# Patient Record
Sex: Female | Born: 1940
Health system: Southern US, Community
[De-identification: ages and names within clinical notes are randomized; demographics above are authoritative.]

## PROBLEM LIST (undated history)

## (undated) DIAGNOSIS — T7840XA Allergy, unspecified, initial encounter: Secondary | ICD-10-CM

## (undated) DIAGNOSIS — E119 Type 2 diabetes mellitus without complications: Secondary | ICD-10-CM

## (undated) HISTORY — PX: TUBAL LIGATION: SHX77

## (undated) HISTORY — PX: EYE SURGERY: SHX253

## (undated) HISTORY — PX: DG THUMB RIGHT HAND (ARMC HX): HXRAD1825

## (undated) HISTORY — DX: Type 2 diabetes mellitus without complications: E11.9

## (undated) HISTORY — DX: Allergy, unspecified, initial encounter: T78.40XA

---

## 2000-04-19 ENCOUNTER — Encounter: Payer: Self-pay | Admitting: Internal Medicine

## 2000-04-19 ENCOUNTER — Ambulatory Visit (HOSPITAL_COMMUNITY): Admission: RE | Admit: 2000-04-19 | Discharge: 2000-04-19 | Payer: Self-pay | Admitting: Internal Medicine

## 2001-11-06 ENCOUNTER — Other Ambulatory Visit: Admission: RE | Admit: 2001-11-06 | Discharge: 2001-11-06 | Payer: Self-pay | Admitting: Internal Medicine

## 2001-12-09 ENCOUNTER — Ambulatory Visit (HOSPITAL_COMMUNITY): Admission: RE | Admit: 2001-12-09 | Discharge: 2001-12-09 | Payer: Self-pay | Admitting: Internal Medicine

## 2001-12-09 ENCOUNTER — Encounter: Payer: Self-pay | Admitting: Internal Medicine

## 2003-03-15 ENCOUNTER — Encounter: Payer: Self-pay | Admitting: Internal Medicine

## 2003-03-15 ENCOUNTER — Ambulatory Visit (HOSPITAL_COMMUNITY): Admission: RE | Admit: 2003-03-15 | Discharge: 2003-03-15 | Payer: Self-pay | Admitting: Internal Medicine

## 2009-05-10 ENCOUNTER — Encounter: Admission: RE | Admit: 2009-05-10 | Discharge: 2009-05-10 | Payer: Self-pay | Admitting: Internal Medicine

## 2009-09-14 LAB — HM COLONOSCOPY

## 2010-11-20 ENCOUNTER — Encounter: Payer: Self-pay | Admitting: Internal Medicine

## 2010-11-30 ENCOUNTER — Other Ambulatory Visit (HOSPITAL_COMMUNITY): Payer: Self-pay | Admitting: Internal Medicine

## 2010-11-30 DIAGNOSIS — Z1231 Encounter for screening mammogram for malignant neoplasm of breast: Secondary | ICD-10-CM

## 2010-11-30 DIAGNOSIS — Z1239 Encounter for other screening for malignant neoplasm of breast: Secondary | ICD-10-CM

## 2010-12-11 ENCOUNTER — Ambulatory Visit (HOSPITAL_COMMUNITY)
Admission: RE | Admit: 2010-12-11 | Discharge: 2010-12-11 | Disposition: A | Discharge status: Home / Self Care | Payer: Medicare FFS | Source: Ambulatory Visit | Attending: Internal Medicine | Admitting: Internal Medicine

## 2010-12-11 DIAGNOSIS — Z1231 Encounter for screening mammogram for malignant neoplasm of breast: Secondary | ICD-10-CM | POA: Insufficient documentation

## 2015-12-28 ENCOUNTER — Telehealth (INDEPENDENT_AMBULATORY_CARE_PROVIDER_SITE_OTHER): Payer: Self-pay | Admitting: Internal Medicine

## 2017-12-25 ENCOUNTER — Ambulatory Visit: Payer: Medicare FFS | Admitting: Family Medicine

## 2018-01-13 ENCOUNTER — Ambulatory Visit (INDEPENDENT_AMBULATORY_CARE_PROVIDER_SITE_OTHER): Payer: Medicare HMO | Admitting: Family Medicine

## 2018-01-13 ENCOUNTER — Encounter: Payer: Self-pay | Admitting: Family Medicine

## 2018-01-13 ENCOUNTER — Other Ambulatory Visit: Payer: Self-pay

## 2018-01-13 VITALS — BP 148/70 | HR 74 | Temp 98.3°F | Ht 61.0 in | Wt 131.0 lb

## 2018-01-13 DIAGNOSIS — H539 Unspecified visual disturbance: Secondary | ICD-10-CM | POA: Insufficient documentation

## 2018-01-13 DIAGNOSIS — Z23 Encounter for immunization: Secondary | ICD-10-CM | POA: Insufficient documentation

## 2018-01-13 DIAGNOSIS — E2839 Other primary ovarian failure: Secondary | ICD-10-CM

## 2018-01-13 DIAGNOSIS — Z0000 Encounter for general adult medical examination without abnormal findings: Secondary | ICD-10-CM | POA: Insufficient documentation

## 2018-01-13 DIAGNOSIS — Z794 Long term (current) use of insulin: Secondary | ICD-10-CM

## 2018-01-13 DIAGNOSIS — E119 Type 2 diabetes mellitus without complications: Secondary | ICD-10-CM | POA: Insufficient documentation

## 2018-01-13 DIAGNOSIS — E1100 Type 2 diabetes mellitus with hyperosmolarity without nonketotic hyperglycemic-hyperosmolar coma (NKHHC): Secondary | ICD-10-CM | POA: Diagnosis not present

## 2018-01-13 DIAGNOSIS — E1165 Type 2 diabetes mellitus with hyperglycemia: Secondary | ICD-10-CM | POA: Diagnosis not present

## 2018-01-13 DIAGNOSIS — E1122 Type 2 diabetes mellitus with diabetic chronic kidney disease: Secondary | ICD-10-CM | POA: Insufficient documentation

## 2018-01-13 HISTORY — DX: Other primary ovarian failure: E28.39

## 2018-01-13 HISTORY — DX: Encounter for general adult medical examination without abnormal findings: Z00.00

## 2018-01-13 HISTORY — DX: Encounter for immunization: Z23

## 2018-01-13 HISTORY — DX: Unspecified visual disturbance: H53.9

## 2018-01-13 LAB — POCT GLYCOSYLATED HEMOGLOBIN (HGB A1C): Hemoglobin A1C: 13.1

## 2018-01-13 NOTE — Progress Notes (Signed)
Subjective: Chief Complaint  Patient presents with  . New Patient (Initial Visit)    HPI: Sara [name] is a 77 y.o. presenting to clinic today to discuss the following:  [name] presents today as a new patient for referral to an opthamologist. She has a past medical history of seasonal allergies. She denies any significant PMH such as HTN, HLD, DM, heart disease, COPD, or asthma.  She states she noticed about one month ago she began having blurry vision in the right center of her visual field of her right eye. She describes the blurry vision as "like looking through a film" or if she had "a film on my eye". She denies any noticeable vision changes and has noticed any difference in reading. She has never had any eye pain, no drainage, and has no other associated symptoms. It is only in her right eye, left eye has normal vision with no changes per patient. She denies blurry vision per se but difficult to illicit what she means by "looking through a film".  She denies headache, syncope, near syncope, chest pain, SOB, cough, congestion, rhinorrhea, abdominal pain, nausea, vomiting, or diarrhea.   She does endorse having some constipation.  Health Maintenance: Given referral for Dexa scan, received PNA vaccine today. Nothing else due at this time.    ROS noted in HPI.   Past Medical, Surgical, Social, and Family History Reviewed & Updated per EMR.   Pertinent Historical Findings include:   Social History   Tobacco Use  Smoking Status Never Smoker  Smokeless Tobacco Never Used    Objective: BP (!) 148/70   Pulse 74   Temp 98.3 F (36.8 C) (Oral)   Ht 5\' 1"  (1.549 m)   Wt 131 lb (59.4 kg)   SpO2 99%   BMI 24.75 kg/m  Vitals and nursing notes reviewed  Physical Exam  Constitutional: She is oriented to person, place, and time and well-developed, well-nourished, and in no distress. No distress.  HENT:  Head: Normocephalic and atraumatic.  Nose: Nose  normal.  Mouth/Throat: Oropharynx is clear and moist. No oropharyngeal exudate.  Eyes: Pupils are equal, round, and reactive to light. Conjunctivae and EOM are normal. Right eye exhibits no discharge. Left eye exhibits no discharge. No scleral icterus.  Visual field is normal. No nystagmus present.   Neck: Neck supple. No tracheal deviation present. No thyromegaly present.  Cardiovascular: Normal rate, regular rhythm, normal heart sounds and intact distal pulses.  No murmur heard. Pulmonary/Chest: Effort normal and breath sounds normal. No respiratory distress. She has no wheezes. She has no rales.  Abdominal: Soft. Bowel sounds are normal. She exhibits no distension. There is no tenderness. There is no rebound.  Musculoskeletal: Normal range of motion. She exhibits no edema, tenderness or deformity.  Lymphadenopathy:    She has no cervical adenopathy.  Neurological: She is alert and oriented to person, place, and time.  Skin: Skin is warm and dry. No rash noted. No erythema.  Psychiatric: Mood and affect normal.   Assessment/Plan:  Estrogen deficiency Dexa scan referral made for patient.  Need for prophylactic vaccination against Streptococcus pneumoniae (pneumococcus) Recommended for patient. Given today after visit by nurse.  Vision changes Most likely, her vision changes are due to developing cataract. She does not appear to have a cataract on physical exam as her lens does not appear grossly cloudy. Differential includes diabetic retinopathy that cannot be excluded. No floaters endorsed making it less likely. Also, she did  not endorse necessarily blurred vision making diabetic retinopathy less likely but needs to be ruled out.  I have referred her to opthalmology.  Type 2 diabetes mellitus with hyperosmolarity without coma, without long-term current use of insulin ([place]) Patient presented with no history, symptoms, or complaints of DM.   HgbA1c was 13.1 with POCT. I have contacted  patient by phone to return to the office to start DM education, lifestyle modification, and will most likely start her on insulin and Metformin.  Routine general medical examination at a health care facility She has had no blood work in 10 years. Due to age and over 10 years since having been screened I am getting today: HgbA1c CBC CMP Lipid Panel    PATIENT EDUCATION PROVIDED: See AVS    Diagnosis and plan along with any newly prescribed medication(s) were discussed in detail with this patient today. The patient verbalized understanding and agreed with the plan. Patient advised if symptoms worsen return to clinic or ER.   Health Maintainance:   Orders Placed This Encounter  Procedures  . DG BONE DENSITY (DXA)    INS-[facility]MA[facility] MCR PF 9-10 YRS AGO UNKNOWN LOCATION/WT 132 LBS/NO CALC SUPP/NO NEEDS/BC PT COSIGN REQ    Order Specific Question:   Reason for Exam (SYMPTOM  OR DIAGNOSIS REQUIRED)    Answer:   ESTROGEN DEFICIENCY    Order Specific Question:   Preferred imaging location?    Answer:   [facility]  . Pneumococcal conjugate vaccine 13-valent IM  . CMP and Liver  . CBC  . Lipid Panel  . Ambulatory referral to Ophthalmology    Referral Priority:   Routine    Referral Type:   Consultation    Referral Reason:   Specialty Services Required    Requested Specialty:   Ophthalmology    Number of Visits Requested:   1  . HgB A1c    No orders of the defined types were placed in this encounter.    [name], DO 01/18/2018, 3:53 PM PGY-1, [place]

## 2018-01-13 NOTE — Patient Instructions (Signed)
It was great to meet you today! Thank you for letting me participate in your care!  Today, we discussed your vision changes in your right eye. It is most likely a cataract. They are not life threatening but will continue to get worse and cause more "cloudy vision" as they progress. I am referring you to opthalmology for possible surgery.  Otherwise, you are very healthy and I am pleased you are staying active and exercising. I will call you with your lab results.  Be well, [name], DO PGY-1, [facility]

## 2018-01-14 LAB — CBC
Hematocrit: 45.5 % (ref 34.0–46.6)
Hemoglobin: 14.4 g/dL (ref 11.1–15.9)
MCH: 26.6 pg (ref 26.6–33.0)
MCHC: 31.6 g/dL (ref 31.5–35.7)
MCV: 84 fL (ref 79–97)
PLATELETS: 243 10*3/uL (ref 150–379)
RBC: 5.41 x10E6/uL — ABNORMAL HIGH (ref 3.77–5.28)
RDW: 14.6 % (ref 12.3–15.4)
WBC: 6.2 10*3/uL (ref 3.4–10.8)

## 2018-01-14 LAB — CMP AND LIVER
ALT: 13 IU/L (ref 0–32)
AST: 16 IU/L (ref 0–40)
Albumin: 4.2 g/dL (ref 3.5–4.8)
Alkaline Phosphatase: 98 IU/L (ref 39–117)
BUN: 16 mg/dL (ref 8–27)
Bilirubin Total: 0.5 mg/dL (ref 0.0–1.2)
Bilirubin, Direct: 0.13 mg/dL (ref 0.00–0.40)
CO2: 23 mmol/L (ref 20–29)
Calcium: 10.1 mg/dL (ref 8.7–10.3)
Chloride: 100 mmol/L (ref 96–106)
Creatinine, Ser: 0.69 mg/dL (ref 0.57–1.00)
GFR calc Af Amer: 98 mL/min/{1.73_m2} (ref >59)
GFR calc non Af Amer: 85 mL/min/{1.73_m2} (ref >59)
Glucose: 257 mg/dL — ABNORMAL HIGH (ref 65–99)
Potassium: 4.7 mmol/L (ref 3.5–5.2)
Sodium: 139 mmol/L (ref 134–144)
Total Protein: 7.2 g/dL (ref 6.0–8.5)

## 2018-01-14 LAB — LIPID PANEL
CHOLESTEROL TOTAL: 191 mg/dL (ref 100–199)
Chol/HDL Ratio: 3 ratio (ref 0.0–4.4)
HDL: 64 mg/dL (ref >39)
LDL CALC: 109 mg/dL — AB (ref 0–99)
Triglycerides: 92 mg/dL (ref 0–149)
VLDL Cholesterol Cal: 18 mg/dL (ref 5–40)

## 2018-01-15 ENCOUNTER — Other Ambulatory Visit: Payer: Self-pay | Admitting: Family Medicine

## 2018-01-15 DIAGNOSIS — Z1231 Encounter for screening mammogram for malignant neoplasm of breast: Secondary | ICD-10-CM

## 2018-01-16 ENCOUNTER — Telehealth: Payer: Self-pay | Admitting: Family Medicine

## 2018-01-16 NOTE — Telephone Encounter (Signed)
Called patient and left VM informing her of abnormal glucose findings. Informed patient to please call the office and return to the clinic Friday preferably but no later than Tuesday.  Will follow up with clinic tomorrow to ensure she calls to be seen.

## 2018-01-17 NOTE — Telephone Encounter (Signed)
Patient has an appointment set up for 02/13/2018. Should we call to see if she can come in sooner?Sara [name], [place]

## 2018-01-18 ENCOUNTER — Encounter: Payer: Self-pay | Admitting: Family Medicine

## 2018-01-18 NOTE — Assessment & Plan Note (Signed)
Recommended for patient. Given today after visit by nurse.

## 2018-01-18 NOTE — Telephone Encounter (Signed)
Yes please!  Let's get her to come in on Monday or Tuesday preferably. If she can't come in on those days just get her the first available appointment that will work for her.

## 2018-01-18 NOTE — Assessment & Plan Note (Signed)
Dexa scan referral made for patient.

## 2018-01-18 NOTE — Assessment & Plan Note (Addendum)
Most likely, her vision changes are due to developing cataract. She does not appear to have a cataract on physical exam as her lens does not appear grossly cloudy. Differential includes diabetic retinopathy that cannot be excluded. No floaters endorsed making it less likely. Also, she did not endorse necessarily blurred vision making diabetic retinopathy less likely but needs to be ruled out.  I have referred her to opthalmology.

## 2018-01-18 NOTE — Assessment & Plan Note (Signed)
Patient presented with no history, symptoms, or complaints of DM.   HgbA1c was 13.1 with POCT. I have contacted patient by phone to return to the office to start DM education, lifestyle modification, and will most likely start her on insulin and Metformin.

## 2018-01-18 NOTE — Assessment & Plan Note (Signed)
She has had no blood work in 10 years. Due to age and over 10 years since having been screened I am getting today: HgbA1c CBC CMP Lipid Panel

## 2018-01-21 NOTE — Telephone Encounter (Signed)
I have been trying to get in touch with patient to see if she could come in sooner to discuss her lab results per Dr. Garlan Fillers, but, there is no voicemail an no answer or the phone is picked up but no one says anything. I will leave her current appointment as is.Ozella Almond, CMA

## 2018-01-27 DIAGNOSIS — H25041 Posterior subcapsular polar age-related cataract, right eye: Secondary | ICD-10-CM | POA: Diagnosis not present

## 2018-01-27 DIAGNOSIS — H2513 Age-related nuclear cataract, bilateral: Secondary | ICD-10-CM | POA: Diagnosis not present

## 2018-01-27 DIAGNOSIS — H524 Presbyopia: Secondary | ICD-10-CM | POA: Diagnosis not present

## 2018-02-10 ENCOUNTER — Ambulatory Visit
Admission: RE | Admit: 2018-02-10 | Discharge: 2018-02-10 | Disposition: A | Discharge status: Home / Self Care | Payer: Medicare HMO | Source: Ambulatory Visit | Attending: Family Medicine | Admitting: Family Medicine

## 2018-02-10 ENCOUNTER — Ambulatory Visit
Admission: RE | Admit: 2018-02-10 | Discharge: 2018-02-10 | Disposition: A | Discharge status: Home / Self Care | Payer: Medicare HMO | Source: Ambulatory Visit | Attending: *Deleted | Admitting: *Deleted

## 2018-02-10 DIAGNOSIS — Z1231 Encounter for screening mammogram for malignant neoplasm of breast: Secondary | ICD-10-CM | POA: Diagnosis not present

## 2018-02-10 DIAGNOSIS — Z780 Asymptomatic menopausal state: Secondary | ICD-10-CM | POA: Diagnosis not present

## 2018-02-10 DIAGNOSIS — M8588 Other specified disorders of bone density and structure, other site: Secondary | ICD-10-CM | POA: Diagnosis not present

## 2018-02-13 ENCOUNTER — Ambulatory Visit: Payer: Medicare HMO | Admitting: Family Medicine

## 2018-02-18 ENCOUNTER — Telehealth: Payer: Self-pay | Admitting: Family Medicine

## 2018-02-18 DIAGNOSIS — Z794 Long term (current) use of insulin: Principal | ICD-10-CM

## 2018-02-18 DIAGNOSIS — E119 Type 2 diabetes mellitus without complications: Secondary | ICD-10-CM

## 2018-02-18 DIAGNOSIS — I10 Essential (primary) hypertension: Secondary | ICD-10-CM | POA: Diagnosis not present

## 2018-02-18 NOTE — Telephone Encounter (Signed)
Pt went to urgent care today.  Her BP readings 176/96.  Pulse was 92.  Pt is wondering if Dr Garlan Fillers needs to see her or could he just sent BP medicine to the drugstore?  [facility]  [place]. Please advise

## 2018-02-18 NOTE — Telephone Encounter (Signed)
Message left on clinic nurse voice mail - daughter calling due to patient has been having dizziness.  Went to urgent care and BP was 170/96 and this was high for her mother.  Patient called here and was given appt for 02/24/18.  Daughter feels this is too long and would like patient seen sooner or some advice.  Returned call to patient's daughter and left message to return call to our office.   Burna Forts, BSN, RN-BC

## 2018-02-19 NOTE — Telephone Encounter (Signed)
Called pt and got her something sooner. Deseree Kennon Holter, CMA

## 2018-02-21 ENCOUNTER — Other Ambulatory Visit: Payer: Self-pay

## 2018-02-21 ENCOUNTER — Encounter: Payer: Self-pay | Admitting: Internal Medicine

## 2018-02-21 ENCOUNTER — Ambulatory Visit (INDEPENDENT_AMBULATORY_CARE_PROVIDER_SITE_OTHER): Payer: Medicare HMO | Admitting: Internal Medicine

## 2018-02-21 VITALS — BP 138/90 | HR 93 | Temp 98.8°F | Ht 61.0 in | Wt 130.8 lb

## 2018-02-21 DIAGNOSIS — R42 Dizziness and giddiness: Secondary | ICD-10-CM

## 2018-02-21 DIAGNOSIS — I10 Essential (primary) hypertension: Secondary | ICD-10-CM | POA: Diagnosis not present

## 2018-02-21 DIAGNOSIS — E1159 Type 2 diabetes mellitus with other circulatory complications: Secondary | ICD-10-CM | POA: Insufficient documentation

## 2018-02-21 DIAGNOSIS — E1100 Type 2 diabetes mellitus with hyperosmolarity without nonketotic hyperglycemic-hyperosmolar coma (NKHHC): Secondary | ICD-10-CM | POA: Diagnosis not present

## 2018-02-21 HISTORY — DX: Dizziness and giddiness: R42

## 2018-02-21 MED ORDER — METFORMIN HCL 500 MG PO TABS: 500.0000 mg | ORAL_TABLET | Freq: Two times a day (BID) | ORAL | 3 refills | Status: DC

## 2018-02-21 MED ORDER — AMLODIPINE BESYLATE 5 MG PO TABS
5.0000 mg | ORAL_TABLET | Freq: Every day | ORAL | 3 refills | Status: DC
Start: 2018-02-21 — End: 2018-04-22

## 2018-02-21 NOTE — Assessment & Plan Note (Signed)
A1C 13  -Discussed nutrition changes with patient-specifically stopping Dr. Samson Frederic -Will start patient on metformin 500 mg once daily increase the dose to twice daily over the next 2 weeks -Patient to follow-up in 2 weeks

## 2018-02-21 NOTE — Patient Instructions (Addendum)
I am going to start you on metformin - for first  week take 1 pill in the morning, for the next week take 1 pill in the monring and 1 pill at night. Please follow up in two weeks with your PCP. I am also going to place you an small dose of blood pressure medication. For the dizzines hydration is key and stopping  Dr. Malachi Bonds.

## 2018-02-21 NOTE — Progress Notes (Addendum)
   [facility] Kerrin Mo, MD Phone: [phone]  Reason For Visit: SDA for Dizziness   # Dizziness  Patient presents with dizziness, she was dizzy on Monday.  Patient went from sitting to standing and had dizziness. Patient had an elevated blood pressure at that time of 170/80. Patient felt a little dizzy again today. No  Chest pain, no SOB, no swelling legs. Denies any vertigo. Denies palpations. Patient has been had increased frequency of urination and increase polydipsia.  He was recently diagnosed with uncontrolled diabetes.  She has not followed up with her PCP yet.  She had an A1c of 13 at her last visit.  She has been drinking lots of Dr. Samson Frederic.   # HTN  Blood pressures have been elevated.  Reports good compliance, took meds today. Tolerating well, w/o complaints. Denies CP, dyspnea, HA, edema,  # Diabetes  -Elevated A1c of 13 at last appointment.  Patient has not followed up with PCP yet.  Past Medical History Reviewed problem list.  Medications- reviewed and updated No additions to family history  Objective: BP 138/90   Pulse 93   Temp 98.8 F (37.1 C) (Oral)   Ht '5\' 1"'$  (1.549 m)   Wt 130 lb 12.8 oz (59.3 kg)   SpO2 98%   BMI 24.71 kg/m  Gen: NAD, alert, cooperative with exam Cardio: regular rate and rhythm, S1S2 heard, no murmurs appreciated Pulm: clear to auscultation bilaterally, no wheezes, rhonchi or rales Extremities: warm, well perfused, No edema, cyanosis or clubbing;  Neurologic exam : Cn 2-7 intact Strength equal & normal in upper & lower extremities Able to walk on heels and toes.   Balance normal, finger to nose     Assessment/Plan: See problem based a/p  Type 2 diabetes mellitus with hyperosmolarity without coma, without long-term current use of insulin ([facility]) A1C 13  -Discussed nutrition changes with patient-specifically stopping Dr. Samson Frederic -Will start patient on metformin 500 mg once daily increase the dose to  twice daily over the next 2 weeks -Patient to follow-up in 2 weeks  Dizziness Dizziness consistent with orthostatic hypotension, not noted today on blood pressures.  However high suspicion for this given patient with significant polyuria and polydipsia.  Likely dehydrated from her highly uncontrolled type 2 diabetes.  Normal neurologic exam, no cardiac sounding symptoms.  -See diabetes for plan management -Continued aggressive hydration -Obtain be met  HTN (hypertension) Notable for elevated blood pressures at home though blood pressure pretty well controlled here Will start a low-dose Norvasc 5 mg Follow-up with PCP in 2 weeks

## 2018-02-21 NOTE — Assessment & Plan Note (Addendum)
Dizziness consistent with orthostatic hypotension, not noted today on blood pressures.  However high suspicion for this given patient with significant polyuria and polydipsia.  Likely dehydrated from her highly uncontrolled type 2 diabetes.  Normal neurologic exam, no cardiac sounding symptoms.  -See diabetes for plan management -Continued aggressive hydration -Obtain be met

## 2018-02-21 NOTE — Assessment & Plan Note (Signed)
Notable for elevated blood pressures at home though blood pressure pretty well controlled here Will start a low-dose Norvasc 5 mg Follow-up with PCP in 2 weeks

## 2018-02-22 LAB — BASIC METABOLIC PANEL
BUN/Creatinine Ratio: 18 (ref 12–28)
BUN: 15 mg/dL (ref 8–27)
CALCIUM: 10.5 mg/dL — AB (ref 8.7–10.3)
CO2: 25 mmol/L (ref 20–29)
Chloride: 94 mmol/L — ABNORMAL LOW (ref 96–106)
Creatinine, Ser: 0.82 mg/dL (ref 0.57–1.00)
GFR, EST AFRICAN AMERICAN: 80 mL/min/{1.73_m2} (ref >59)
GFR, EST NON AFRICAN AMERICAN: 70 mL/min/{1.73_m2} (ref >59)
Glucose: 307 mg/dL — ABNORMAL HIGH (ref 65–99)
POTASSIUM: 4.8 mmol/L (ref 3.5–5.2)
Sodium: 133 mmol/L — ABNORMAL LOW (ref 134–144)

## 2018-02-24 ENCOUNTER — Ambulatory Visit: Payer: Medicare HMO | Admitting: Internal Medicine

## 2018-02-26 DIAGNOSIS — H25041 Posterior subcapsular polar age-related cataract, right eye: Secondary | ICD-10-CM | POA: Diagnosis not present

## 2018-02-26 DIAGNOSIS — H2511 Age-related nuclear cataract, right eye: Secondary | ICD-10-CM | POA: Diagnosis not present

## 2018-02-26 DIAGNOSIS — H25811 Combined forms of age-related cataract, right eye: Secondary | ICD-10-CM | POA: Diagnosis not present

## 2018-02-26 DIAGNOSIS — H25011 Cortical age-related cataract, right eye: Secondary | ICD-10-CM | POA: Diagnosis not present

## 2018-02-27 ENCOUNTER — Encounter: Payer: Self-pay | Admitting: Family Medicine

## 2018-02-27 NOTE — Progress Notes (Signed)
Sending results letter to patient.

## 2018-03-03 ENCOUNTER — Encounter: Payer: Self-pay | Admitting: Internal Medicine

## 2018-03-07 ENCOUNTER — Encounter: Payer: Self-pay | Admitting: Family Medicine

## 2018-03-07 ENCOUNTER — Other Ambulatory Visit: Payer: Self-pay

## 2018-03-07 ENCOUNTER — Ambulatory Visit (INDEPENDENT_AMBULATORY_CARE_PROVIDER_SITE_OTHER): Payer: Medicare HMO | Admitting: Family Medicine

## 2018-03-07 VITALS — BP 140/76 | HR 100 | Temp 98.5°F | Ht 61.0 in | Wt 130.2 lb

## 2018-03-07 DIAGNOSIS — E1100 Type 2 diabetes mellitus with hyperosmolarity without nonketotic hyperglycemic-hyperosmolar coma (NKHHC): Secondary | ICD-10-CM | POA: Diagnosis not present

## 2018-03-07 LAB — POCT UA - MICROALBUMIN
Creatinine, POC: 200 mg/dL
Microalbumin Ur, POC: 150 mg/L

## 2018-03-07 LAB — GLUCOSE, POCT (MANUAL RESULT ENTRY): POC Glucose: 214 mg/dl — AB (ref 70–99)

## 2018-03-07 LAB — POCT GLYCOSYLATED HEMOGLOBIN (HGB A1C): Hemoglobin A1C: 12.6

## 2018-03-07 MED ORDER — INSULIN GLARGINE 100 UNIT/ML SOLOSTAR PEN: 10.0000 [IU] | PEN_INJECTOR | Freq: Every day | SUBCUTANEOUS | 0 refills | Status: DC

## 2018-03-07 MED ORDER — ONETOUCH VERIO W/DEVICE KIT: 1.0000 | PACK | Freq: Once | 0 refills | Status: AC

## 2018-03-07 MED ORDER — ONETOUCH DELICA LANCING DEV MISC: 1.0000 | Freq: Once | 0 refills | Status: AC

## 2018-03-07 MED ORDER — GLUCOSE BLOOD VI STRP: ORAL_STRIP | 12 refills | Status: DC

## 2018-03-07 MED ORDER — ACCU-CHEK SOFTCLIX LANCETS MISC: 12 refills | Status: DC

## 2018-03-07 MED ORDER — ONETOUCH LANCETS MISC: 1.0000 | Freq: Every day | 99 refills | Status: DC

## 2018-03-07 NOTE — Telephone Encounter (Signed)
[name] After Hours Telephone Line   Patient calling regarding glucometer. Was prescribed glucometer today by Dr. Garlan Fillers and started on insulin. Glucometer prescribed (One Touch) not covered by Medicare, and patient unable to afford this. As such, was calling to request prescription for Accu-check glucometer be sent in. Discussed that typically medications/medical devices are not prescribed or refilled on after hours line, however as this pertained to insulin use, would make exception. [place] to ensure correct ordering and insurance coverage of Accu-check glucometer, lancets, and test strips. Patient to pick up tonight. Patient voiced understanding and appreciation.   Adin Hector, MD, MPH PGY-3 [facility] Pager [phone]

## 2018-03-07 NOTE — Progress Notes (Signed)
Subjective: No chief complaint on file.  HPI: Sara [name] is a 77 y.o. presenting to clinic today to discuss the following:  Diabetes Follow Up Patient states she has made dietary changes. She has stopped drinking sodas and sugary drinks and is drinking mainly water or unsweetened tea. She does use artificial sweeteners. She has been eating whole grains and less processed foods. Her HgbA1c today is still elevated above goal after starting Metformin. We discussed starting insulin today and checking blood sugar every morning and patient is agreeable. She has no other complaints today and states "I feel just fine".  Review of Systems  Constitutional: Negative for chills, fever, malaise/fatigue and weight loss.  Eyes: Negative for blurred vision, double vision and photophobia.  Respiratory: Negative for cough, shortness of breath and wheezing.   Cardiovascular: Negative for chest pain and palpitations.  Gastrointestinal: Negative for abdominal pain, constipation, diarrhea, nausea and vomiting.  Genitourinary: Positive for frequency. Negative for dysuria and urgency.  Musculoskeletal: Negative for joint pain and myalgias.  Skin: Negative for rash.  Neurological: Negative for headaches.  Endo/Heme/Allergies: Negative for polydipsia.   Health Maintenance: foot exam, urine microalbumin   ROS noted in HPI.   Past Medical, Surgical, Social, and Family History Reviewed & Updated per EMR.   Pertinent Historical Findings include:   Social History   Tobacco Use  Smoking Status Never Smoker  Smokeless Tobacco Never Used   Objective: BP 140/76   Pulse 100   Temp 98.5 F (36.9 C) (Oral)   Ht 5' 1"  (1.549 m)   Wt 130 lb 3.2 oz (59.1 kg)   SpO2 97%   BMI 24.60 kg/m  Vitals and nursing notes reviewed  Physical Exam  Constitutional: She is oriented to person, place, and time. She appears well-developed and well-nourished. No distress.  HENT:  Head: Normocephalic and  atraumatic.  Nose: Nose normal.  Mouth/Throat: Oropharynx is clear and moist.  Eyes: Pupils are equal, round, and reactive to light. Conjunctivae and EOM are normal.  Neck: Neck supple.  Cardiovascular: Normal rate, regular rhythm, normal heart sounds and intact distal pulses.  No murmur heard. Pulses:      Dorsalis pedis pulses are 2+ on the right side, and 2+ on the left side.       Posterior tibial pulses are 2+ on the right side, and 2+ on the left side.  Pulmonary/Chest: Effort normal and breath sounds normal. No respiratory distress. She has no wheezes. She has no rales.  Abdominal: Soft. Bowel sounds are normal. There is no tenderness. There is no guarding.  Musculoskeletal: Normal range of motion. She exhibits no edema or deformity.       Right foot: There is no deformity.       Left foot: There is no deformity.  Feet:  Right Foot:  Protective Sensation: 10 sites tested. 10 sites sensed.  Skin Integrity: Negative for ulcer, skin breakdown, warmth or callus.  Left Foot:  Protective Sensation: 10 sites tested. 10 sites sensed.  Skin Integrity: Negative for ulcer, skin breakdown, warmth or callus.  Neurological: She is alert and oriented to person, place, and time.  Skin: Skin is warm. Capillary refill takes less than 2 seconds. No rash noted. No erythema.   No results found for this or any previous visit (from the past 72 hour(s)).  Assessment/Plan:  Type 2 diabetes mellitus with hyperosmolarity without coma, without long-term current use of insulin ([place]) Patient started on Insulin Lantus 10U with instructions to  titrate up 1U per day each time BG is above 100. Patient was educated on use of insulin and demonstrated ability by giving herself the first shot. Patient was given prescriptions for Lantus, lancets, glucose monitor, blood test strips, and lancing device.  Patient is showing good compliance. Recheck HgbA1c in 3 months. Keep daily BG log.  Cont metformin at current  dose.  Follow up in 2 weeks.   PATIENT EDUCATION PROVIDED: See AVS    Diagnosis and plan along with any newly prescribed medication(s) were discussed in detail with this patient today. The patient verbalized understanding and agreed with the plan. Patient advised if symptoms worsen return to clinic or ER.   Health Maintainance:   Orders Placed This Encounter  Procedures  . Ambulatory referral to diabetic education    Referral Priority:   Routine    Referral Type:   Consultation    Referral Reason:   Specialty Services Required    Number of Visits Requested:   1  . Glucose (CBG)  . POCT UA - Microalbumin  . POCT HgB A1C (CPT 83036)    Meds ordered this encounter  Medications  . Insulin Glargine (LANTUS SOLOSTAR) 100 UNIT/ML Solostar Pen    Sig: Inject 10-20 Units into the skin daily before breakfast.    Dispense:  1 pen    Refill:  0    Order Specific Question:   Lot Number?    Answer:   [id number]    Order Specific Question:   Expiration Date?    Answer:   07/28/2020    Order Specific Question:   Manufacturer?    Answer:   [employer] [[id number]]    Order Specific Question:   NDC    Answer:   8381-8403-75 [[id number]]    Order Specific Question:   Quantity    Answer:   1    Comments:   pen  . Blood Glucose Monitoring Suppl (ONETOUCH VERIO) w/Device KIT    Sig: 1 Device by Does not apply route once for 1 dose.    Dispense:  1 kit    Refill:  0  . DISCONTD: glucose blood test strip    Sig: Use as instructed    Dispense:  100 each    Refill:  12  . Lancet Devices (ONE TOUCH DELICA LANCING DEV) MISC    Sig: 1 Device by Does not apply route once for 1 dose.    Dispense:  1 each    Refill:  0  . DISCONTD: ONE TOUCH LANCETS MISC    Sig: 1 Container by Does not apply route daily.    Dispense:  100 each    Refill:  prn     [name], DO 03/07/2018, 3:15 PM PGY-1, [place]

## 2018-03-07 NOTE — Patient Instructions (Addendum)
It was great to see you today! Thank you for letting me participate in your care!  A great sugar replacement is called Stevia.  Today, we discussed your diagnosis of Diabetes. I have you continuing metformin 574m twice a day. I have also added another medication to help called Lantus. Take this every night as instructed by Dr. KSamara Deist I have sent your blood sugar checking device kit and all the things you need to check it to your pharmacy. Please pick it up today.  Please continue with lifestyle modification to help control your blood glucose. This includes exercising for at least 30 minutes per day 5 times per week and avoiding foods high in sugar like sodas, sweets, candy, etc.  I will see you in 1 month to have a recheck of your blood sugar to see how the medications are helping. Please come in sooner if you are not feeling well.   Diabetes Mellitus and Standards of Medical Care Managing diabetes (diabetes mellitus) can be complicated. Your diabetes treatment may be managed by a team of health care providers, including:  A diet and nutrition specialist (registered dietitian).  A nurse.  A certified diabetes educator (CDE).  A diabetes specialist (endocrinologist).  An eye doctor.  A primary care provider.  A dentist.  Your health care providers follow a schedule in order to help you get the best quality of care. The following schedule is a general guideline for your diabetes management plan. Your health care providers may also give you more specific instructions. HbA1c ( hemoglobin A1c) test This test provides information about blood sugar (glucose) control over the previous 2-3 months. It is used to check whether your diabetes management plan needs to be adjusted.  If you are meeting your treatment goals, this test is done at least 2 times a year.  If you are not meeting treatment goals or if your treatment goals have changed, this test is done 4 times a year.  Blood  pressure test  This test is done at every routine medical visit. For most people, the goal is less than 130/80. Ask your health care provider what your goal blood pressure should be. Dental and eye exams  Visit your dentist two times a year.  If you have type 1 diabetes, get an eye exam 3-5 years after you are diagnosed, and then once a year after your first exam. ? If you were diagnosed with type 1 diabetes as a child, get an eye exam when you are age 3854or older and have had diabetes for 3-5 years. After the first exam, you should get an eye exam once a year.  If you have type 2 diabetes, have an eye exam as soon as you are diagnosed, and then once a year after your first exam. Foot care exam  Visual foot exams are done at every routine medical visit. The exams check for cuts, bruises, redness, blisters, sores, or other problems with the feet.  A complete foot exam is done by your health care provider once a year. This exam includes an inspection of the structure and skin of your feet, and a check of the pulses and sensation in your feet. ? Type 1 diabetes: Get your first exam 3-5 years after diagnosis. ? Type 2 diabetes: Get your first exam as soon as you are diagnosed.  Check your feet every day for cuts, bruises, redness, blisters, or sores. If you have any of these or other problems that are not healing, contact  your health care provider. Kidney function test ( urine microalbumin)  This test is done once a year. ? Type 1 diabetes: Get your first test 5 years after diagnosis. ? Type 2 diabetes: Get your first test as soon as you are diagnosed.  If you have chronic kidney disease (CKD), get a serum creatinine and estimated glomerular filtration rate (eGFR) test once a year. Lipid profile (cholesterol, HDL, LDL, triglycerides)  This test should be done when you are diagnosed with diabetes, and every 5 years after the first test. If you are on medicines to lower your cholesterol, you  may need to get this test done every year. ? The goal for LDL is less than 100 mg/dL (5.5 mmol/L). If you are at high risk, the goal is less than 70 mg/dL (3.9 mmol/L). ? The goal for HDL is 40 mg/dL (2.2 mmol/L) for men and 50 mg/dL(2.8 mmol/L) for women. An HDL cholesterol of 60 mg/dL (3.3 mmol/L) or higher gives some protection against heart disease. ? The goal for triglycerides is less than 150 mg/dL (8.3 mmol/L). Immunizations  The yearly flu (influenza) vaccine is recommended for everyone 6 months or older who has diabetes.  The pneumonia (pneumococcal) vaccine is recommended for everyone 2 years or older who has diabetes. If you are 5 or older, you may get the pneumonia vaccine as a series of two separate shots.  The hepatitis B vaccine is recommended for adults shortly after they have been diagnosed with diabetes.  The Tdap (tetanus, diphtheria, and pertussis) vaccine should be given: ? According to normal childhood vaccination schedules, for children. ? Every 10 years, for adults who have diabetes.  The shingles vaccine is recommended for people who have had chicken pox and are 50 years or older. Mental and emotional health  Screening for symptoms of eating disorders, anxiety, and depression is recommended at the time of diagnosis and afterward as needed. If your screening shows that you have symptoms (you have a positive screening result), you may need further evaluation and be referred to a mental health care provider. Diabetes self-management education  Education about how to manage your diabetes is recommended at diagnosis and ongoing as needed. Treatment plan  Your treatment plan will be reviewed at every medical visit. Summary  Managing diabetes (diabetes mellitus) can be complicated. Your diabetes treatment may be managed by a team of health care providers.  Your health care providers follow a schedule in order to help you get the best quality of care.  Standards of  care including having regular physical exams, blood tests, blood pressure monitoring, immunizations, screening tests, and education about how to manage your diabetes.  Your health care providers may also give you more specific instructions based on your individual health. This information is not intended to replace advice given to you by your health care provider. Make sure you discuss any questions you have with your health care provider. Document Released: 08/12/2009 Document Revised: 07/13/2016 Document Reviewed: 07/13/2016 Elsevier Interactive Patient Education  2018 [place], [name], DO PGY-1, [facility]

## 2018-03-11 NOTE — Assessment & Plan Note (Signed)
Patient started on Insulin Lantus 10U with instructions to titrate up 1U per day each time BG is above 100. Patient was educated on use of insulin and demonstrated ability by giving herself the first shot. Patient was given prescriptions for Lantus, lancets, glucose monitor, blood test strips, and lancing device.  Patient is showing good compliance. Recheck HgbA1c in 3 months. Keep daily BG log.  Cont metformin at current dose.  Follow up in 2 weeks.

## 2018-03-25 ENCOUNTER — Ambulatory Visit (INDEPENDENT_AMBULATORY_CARE_PROVIDER_SITE_OTHER): Payer: Medicare HMO | Admitting: Family Medicine

## 2018-03-25 ENCOUNTER — Other Ambulatory Visit: Payer: Self-pay

## 2018-03-25 ENCOUNTER — Encounter: Payer: Self-pay | Admitting: Family Medicine

## 2018-03-25 VITALS — BP 120/60 | HR 82 | Temp 98.7°F | Ht 61.0 in | Wt 131.0 lb

## 2018-03-25 DIAGNOSIS — E1100 Type 2 diabetes mellitus with hyperosmolarity without nonketotic hyperglycemic-hyperosmolar coma (NKHHC): Secondary | ICD-10-CM | POA: Diagnosis not present

## 2018-03-25 LAB — GLUCOSE, POCT (MANUAL RESULT ENTRY): POC Glucose: 191 mg/dl — AB (ref 70–99)

## 2018-03-25 MED ORDER — INSULIN GLARGINE 100 UNIT/ML ~~LOC~~ SOLN: 11.0000 [IU] | Freq: Every day | SUBCUTANEOUS | 11 refills | Status: DC

## 2018-03-25 MED ORDER — INSULIN GLARGINE 100 UNIT/ML SOLOSTAR PEN: 10.0000 [IU] | PEN_INJECTOR | Freq: Every day | SUBCUTANEOUS | 0 refills | Status: DC

## 2018-03-25 NOTE — Progress Notes (Signed)
Subjective: Chief Complaint  Patient presents with  . Diabetes     HPI: Sara [name] is a 77 y.o. presenting to clinic today to discuss the following:  DM Follow up Patient has been keeping a log of her morning blood glucose before breakfast but forgot to bring it with her today. She states it has usually ranged from 100-200 but nothing over 200. She has not been titrating up her Lantus morning dose as discussed due to not understanding instructions. Discussed with her about how to increase the dose of Lantus if her morning blood glucose check is above 100. Had her do teach back and her daughter was present who also expressed understanding.  Denies fever, chills, chest pain, SOB, palpitations, difficulty breathing, abdominal pain, nausea, vomiting, constipation, or diarrhea. Does endorse cough  Health Maintenance: none    ROS noted in HPI.   Past Medical, Surgical, Social, and Family History Reviewed & Updated per EMR.   Pertinent Historical Findings include:   Social History   Tobacco Use  Smoking Status Never Smoker  Smokeless Tobacco Never Used   Objective: BP 120/60   Pulse 82   Temp 98.7 F (37.1 C) (Oral)   Ht 5\' 1"  (1.549 m)   Wt 131 lb (59.4 kg)   SpO2 99%   BMI 24.75 kg/m  Vitals and nursing notes reviewed  Physical Exam  Constitutional: She is oriented to person, place, and time. She appears well-developed and well-nourished. No distress.  HENT:  Head: Normocephalic and atraumatic.  Eyes: Pupils are equal, round, and reactive to light. Conjunctivae and EOM are normal.  Neck: Normal range of motion.  Cardiovascular: Normal rate, regular rhythm, normal heart sounds and intact distal pulses.  No murmur heard. Pulmonary/Chest: Effort normal and breath sounds normal. No respiratory distress. She has no wheezes. She has no rales.  Abdominal: Soft. Bowel sounds are normal. She exhibits no distension. There is no tenderness.  Musculoskeletal: Normal  range of motion. She exhibits no edema or tenderness.  Neurological: She is alert and oriented to person, place, and time.  Skin: Skin is warm and dry. No rash noted. No erythema.     Results for orders placed or performed in visit on 03/25/18 (from the past 72 hour(s))  POCT glucose (manual entry)     Status: Abnormal   Collection Time: 03/25/18 10:16 AM  Result Value Ref Range   POC Glucose 191 (A) 70 - 99 mg/dl    Assessment/Plan:  Type 2 diabetes mellitus with hyperosmolarity without coma, without long-term current use of insulin (HCC) Follow up in one month as patient has only been taking 10 units daily and not titrating up her Lantus dose. However, I am encouraged to see improvement in her morning blood glucose readings as before starting Lantus it was above 300 and now seems (patient reports) it to be below 200. It was 191 in office check but this was non-fasting.  Discussed in detail on how to increase her Lantus dose daily based on blood glucose being over 100 and used teach back method.  Refilled Lantus pen for patient.   PATIENT EDUCATION PROVIDED: See AVS    Diagnosis and plan along with any newly prescribed medication(s) were discussed in detail with this patient today. The patient verbalized understanding and agreed with the plan. Patient advised if symptoms worsen return to clinic or ER.   Health Maintainance:   Orders Placed This Encounter  Procedures  . POCT glucose (manual entry)  Meds ordered this encounter  Medications  . DISCONTD: Insulin Glargine (LANTUS SOLOSTAR) 100 UNIT/ML Solostar Pen    Sig: Inject 10-20 Units into the skin daily before breakfast.    Dispense:  1 pen    Refill:  0    Order Specific Question:   Lot Number?    Answer:   [id number]    Order Specific Question:   Expiration Date?    Answer:   07/28/2020    Order Specific Question:   Manufacturer?    Answer:   [employer] [[id number]]    Order Specific Question:   NDC    Answer:    5537-4827-07 [[id number]]    Order Specific Question:   Quantity    Answer:   1    Comments:   pen  . insulin glargine (LANTUS) 100 UNIT/ML injection    Sig: Inject 0.11 mLs (11 Units total) into the skin daily. Go up 1 unit daily for every time morning blood glucose is over 100.    Dispense:  10 mL    Refill:  [place], DO 03/25/2018, 10:45 AM PGY-1, [place]

## 2018-03-25 NOTE — Progress Notes (Signed)
Dm  ? ?

## 2018-03-25 NOTE — Assessment & Plan Note (Signed)
Follow up in one month as patient has only been taking 10 units daily and not titrating up her Lantus dose. However, I am encouraged to see improvement in her morning blood glucose readings as before starting Lantus it was above 300 and now seems (patient reports) it to be below 200. It was 191 in office check but this was non-fasting.  Discussed in detail on how to increase her Lantus dose daily based on blood glucose being over 100 and used teach back method.  Refilled Lantus pen for patient.

## 2018-03-25 NOTE — Patient Instructions (Addendum)
It was great to see you today! Thank you for letting me participate in your care!  Today, we discussed your current use of Insulin. Please continue to go up daily on how much Insulin you inject IF your blood sugar is over 100.   For example, if your blood sugar is over 100 tomorrow morning, please increase from 11 units to 12 units. If your blood sugar is over 100 again on Thursday, go up from 12 units to 13 units and continue this until it is no longer over 100. Please continue to exercise and keep a daily record of your blood sugars in the morning before you eat.  Be well, [name], DO PGY-1, [facility]

## 2018-04-01 ENCOUNTER — Telehealth: Payer: Self-pay

## 2018-04-01 NOTE — Telephone Encounter (Signed)
Pt LVM on nurse line. Patient states the wrong Rx was sent in. She needs the pins refilled. Please call and she will tell us what she needs. Please call pt on [phone]. Ottis Stain, CMA

## 2018-04-02 ENCOUNTER — Other Ambulatory Visit: Payer: Self-pay | Admitting: Family Medicine

## 2018-04-02 MED ORDER — INSULIN GLARGINE 100 UNIT/ML SOLOSTAR PEN: 11.0000 [IU] | PEN_INJECTOR | Freq: Every day | SUBCUTANEOUS | 99 refills | Status: DC

## 2018-04-02 NOTE — Progress Notes (Signed)
Patient states when I reordered her Lantus it was not in pen form but in a vial form.  Talked with patient and apologized for the wrong order as I intended to order the Lantus pen. Reordering Lantus pen today for refill for patient.

## 2018-04-07 ENCOUNTER — Telehealth: Payer: Self-pay | Admitting: Family Medicine

## 2018-04-07 NOTE — Telephone Encounter (Signed)
Pt cannot afford $225 ( after insurance) for her insulin pen. She would like to know if there is something else that can be prescribed. She would like to talk Dr Garlan Fillers or his nurse.  She needs to talk to somehon today. Please advise

## 2018-04-09 NOTE — Telephone Encounter (Signed)
Spoke with [employer] There is a problem with how [facility] is running prescription. Called [facility] and asked them to run it as 90 day supply and not 137 day supply. When pharmacist did this, copay was $135. Called patient and she will be able to go get this today.  [name], RN [facility] RN)

## 2018-04-09 NOTE — Telephone Encounter (Signed)
Pt calls again in regards to her insulin. Lantus was originally prescribed, pt can not afford this medication. I sent a message to provider asking if ok to give a sample, as Valentina Lucks is out of town this week.

## 2018-04-22 ENCOUNTER — Ambulatory Visit (INDEPENDENT_AMBULATORY_CARE_PROVIDER_SITE_OTHER): Payer: Medicare HMO | Admitting: Family Medicine

## 2018-04-22 ENCOUNTER — Other Ambulatory Visit: Payer: Self-pay

## 2018-04-22 ENCOUNTER — Encounter: Payer: Self-pay | Admitting: Family Medicine

## 2018-04-22 VITALS — BP 118/65 | HR 78 | Temp 97.8°F | Ht 61.0 in | Wt 129.2 lb

## 2018-04-22 DIAGNOSIS — E119 Type 2 diabetes mellitus without complications: Secondary | ICD-10-CM | POA: Diagnosis not present

## 2018-04-22 DIAGNOSIS — E1100 Type 2 diabetes mellitus with hyperosmolarity without nonketotic hyperglycemic-hyperosmolar coma (NKHHC): Secondary | ICD-10-CM

## 2018-04-22 DIAGNOSIS — Z794 Long term (current) use of insulin: Secondary | ICD-10-CM

## 2018-04-22 DIAGNOSIS — I10 Essential (primary) hypertension: Secondary | ICD-10-CM

## 2018-04-22 MED ORDER — METFORMIN HCL 500 MG PO TABS: 500.0000 mg | ORAL_TABLET | Freq: Two times a day (BID) | ORAL | 3 refills | Status: DC

## 2018-04-22 MED ORDER — ALCOHOL SWABS PADS: 1.0000 "application " | MEDICATED_PAD | 6 refills | Status: AC

## 2018-04-22 MED ORDER — GLUCOSE BLOOD VI STRP: ORAL_STRIP | 12 refills | Status: DC

## 2018-04-22 MED ORDER — ACCU-CHEK SOFTCLIX LANCETS MISC: 12 refills | Status: DC

## 2018-04-22 MED ORDER — AMLODIPINE BESYLATE 5 MG PO TABS: 5.0000 mg | ORAL_TABLET | Freq: Every day | ORAL | 3 refills | Status: DC

## 2018-04-22 NOTE — Patient Instructions (Addendum)
It was great to see you again today! Thank you for letting me participate in your care!  Today, we discussed your blood sugar. According to your blood sugar diary it is better controlled. I refilled all your diabetic medications and your amlodipine for your blood pressure.  I will see you in two months to see how improved your average blood sugar has been over the past 3 months. Continue to eat well and exercise daily.  Be well, [name], DO PGY-1, [facility]

## 2018-04-22 NOTE — Progress Notes (Signed)
Subjective: Chief Complaint  Patient presents with  . Hypertension     HPI: Sara [name] is a 77 y.o. presenting to clinic today to discuss the following:  DM Follow up Patient is doing well and reports a few episodes of lightheadedness but no loss of balance, loss of consciousness. She is eating more vegetables and avoiding foods high in sugar. Blood sugar diary for past 3 weeks saw BG of 88-219. Average is around 120s. She is taking Lantus 20U daily now. New plan is to increase 1U per day only if over 150.  She has no other complaints today.  Health Maintenance: None     ROS noted in HPI.   Past Medical, Surgical, Social, and Family History Reviewed & Updated per EMR.   Pertinent Historical Findings include:   Social History   Tobacco Use  Smoking Status Never Smoker  Smokeless Tobacco Never Used    Objective: BP 118/65   Pulse 78   Temp 97.8 F (36.6 C) (Oral)   Ht 5\' 1"  (1.549 m)   Wt 129 lb 3.2 oz (58.6 kg)   SpO2 99%   BMI 24.41 kg/m  Vitals and nursing notes reviewed  Physical Exam Gen: Alert and Oriented x 3, NAD HEENT: Normocephalic, atraumatic, PERRLA, EOMI CV: RRR, no murmurs, normal S1, S2 split, +2 pulses dorsalis pedis bilaterally Resp: CTAB, no wheezing, rales, or rhonchi, comfortable work of breathing Ext: no clubbing, cyanosis, or edema Skin: warm, dry, intact, no rashes  No results found for this or any previous visit (from the past 72 hour(s)).  Assessment/Plan:  HTN (hypertension) BP well controlled today at 118/62. Patient thinks her BP is higher at home b/c her BP cuff is not accurate.   Cont Norvasc at 5mg  daily  Type 2 diabetes mellitus without complication, with long-term current use of insulin ([place]) Patient appears to have better controlled blood glucose according to home diary. Will see her back in 2 months and get HgbA1c.  Lantus 20U daily. Patient will now only titrate up 1U per day that it is over  150.  Refilled Metformin, continue 500mg  BID   PATIENT EDUCATION PROVIDED: See AVS    Diagnosis and plan along with any newly prescribed medication(s) were discussed in detail with this patient today. The patient verbalized understanding and agreed with the plan. Patient advised if symptoms worsen return to clinic or ER.   Health Maintainance:   No orders of the defined types were placed in this encounter.   Meds ordered this encounter  Medications  . DISCONTD: amLODipine (NORVASC) 5 MG tablet    Sig: Take 1 tablet (5 mg total) by mouth daily.    Dispense:  90 tablet    Refill:  3  . DISCONTD: glucose blood test strip    Sig: Use as instructed    Dispense:  100 each    Refill:  12  . DISCONTD: metFORMIN (GLUCOPHAGE) 500 MG tablet    Sig: Take 1 tablet (500 mg total) by mouth 2 (two) times daily with a meal.    Dispense:  180 tablet    Refill:  3  . DISCONTD: ACCU-CHEK SOFTCLIX LANCETS lancets    Sig: Use as instructed    Dispense:  100 each    Refill:  12  . Alcohol Swabs PADS    Sig: 1 application by Does not apply route 1 day or 1 dose.    Dispense:  30 each    Refill:  6  .  amLODipine (NORVASC) 5 MG tablet    Sig: Take 1 tablet (5 mg total) by mouth daily.    Dispense:  90 tablet    Refill:  3  . glucose blood test strip    Sig: Use as instructed    Dispense:  100 each    Refill:  12  . metFORMIN (GLUCOPHAGE) 500 MG tablet    Sig: Take 1 tablet (500 mg total) by mouth 2 (two) times daily with a meal.    Dispense:  180 tablet    Refill:  3  . ACCU-CHEK SOFTCLIX LANCETS lancets    Sig: Use as instructed. BD ultra-Fine Pen Needles 55mm x 32G    Dispense:  100 each    Refill:  [place], DO 04/22/2018, 2:25 PM PGY-1, [place]

## 2018-04-22 NOTE — Assessment & Plan Note (Signed)
BP well controlled today at 118/62. Patient thinks her BP is higher at home b/c her BP cuff is not accurate.   Cont Norvasc at 5mg  daily

## 2018-04-22 NOTE — Assessment & Plan Note (Addendum)
Patient appears to have better controlled blood glucose according to home diary. Will see her back in 2 months and get HgbA1c.  Lantus 20U daily. Patient will now only titrate up 1U per day that it is over 150.  Refilled Metformin, continue 500mg  BID

## 2018-04-28 ENCOUNTER — Other Ambulatory Visit: Payer: Self-pay

## 2018-04-28 DIAGNOSIS — Z794 Long term (current) use of insulin: Principal | ICD-10-CM

## 2018-04-28 DIAGNOSIS — E119 Type 2 diabetes mellitus without complications: Secondary | ICD-10-CM

## 2018-04-28 MED ORDER — ACCU-CHEK SOFTCLIX LANCETS MISC: 12 refills | Status: DC

## 2018-04-28 NOTE — Telephone Encounter (Signed)
Pt called nurse line requesting a refill on pended medication. Please advise.

## 2018-05-02 ENCOUNTER — Telehealth: Payer: Self-pay

## 2018-05-02 NOTE — Telephone Encounter (Signed)
Pt calling for refill of Nano Fine Point pen needles for her insulin Pens 77mm. She is completely out. Send refill to [facility] on [address]. Please call her on [phone] when they have been refilled. Ottis Stain, CMA

## 2018-05-05 ENCOUNTER — Other Ambulatory Visit: Payer: Self-pay

## 2018-05-05 DIAGNOSIS — E1100 Type 2 diabetes mellitus with hyperosmolarity without nonketotic hyperglycemic-hyperosmolar coma (NKHHC): Secondary | ICD-10-CM

## 2018-05-05 DIAGNOSIS — I10 Essential (primary) hypertension: Secondary | ICD-10-CM

## 2018-05-05 MED ORDER — METFORMIN HCL 500 MG PO TABS: 500.0000 mg | ORAL_TABLET | Freq: Two times a day (BID) | ORAL | 3 refills | Status: DC

## 2018-05-05 MED ORDER — AMLODIPINE BESYLATE 5 MG PO TABS: 5.0000 mg | ORAL_TABLET | Freq: Every day | ORAL | 3 refills | Status: DC

## 2018-05-05 NOTE — Telephone Encounter (Signed)
Pt called nurse line requesting a refill on pended medications, please advise.

## 2018-05-06 ENCOUNTER — Telehealth: Payer: Self-pay

## 2018-05-06 ENCOUNTER — Other Ambulatory Visit: Payer: Self-pay | Admitting: Family Medicine

## 2018-05-06 NOTE — Telephone Encounter (Signed)
Called patient to verify that she did not have any lancets because per Dr. Garlan Fillers the RX was filled at the last visit.  Patient states that she did not have them and that she needed metformin and amlodipine as well.   Called pharmacy and checked on RX's for patient lancets, amlodipine and metformin. Was told by pharmacist that all the RX's are on hold since the first of July because they are too early to be filled.  She said that she will release them on tomorrow and fill them for patient pickup.  Notified patient of status.  Sara [name], [place]

## 2018-05-21 ENCOUNTER — Telehealth: Payer: Self-pay

## 2018-05-21 NOTE — Telephone Encounter (Signed)
Pt called nurse line requesting a refill on her needles for her insulin pen? Please advise.

## 2018-05-22 ENCOUNTER — Other Ambulatory Visit: Payer: Self-pay | Admitting: *Deleted

## 2018-05-22 MED ORDER — PEN NEEDLES 3/16" 31G X 5 MM MISC
3 refills | Status: DC
Start: 2018-05-22 — End: 2018-07-31

## 2018-05-22 NOTE — Telephone Encounter (Signed)
Verbal received from MD to send in.  Sara [name], Sara [name], CMA

## 2018-06-18 ENCOUNTER — Emergency Department (HOSPITAL_COMMUNITY)
Admission: EM | Admit: 2018-06-18 | Discharge: 2018-06-19 | Disposition: A | Discharge status: Home / Self Care | Payer: Medicare HMO | Attending: Emergency Medicine | Admitting: Emergency Medicine

## 2018-06-18 ENCOUNTER — Encounter (HOSPITAL_COMMUNITY): Payer: Self-pay | Admitting: Emergency Medicine

## 2018-06-18 DIAGNOSIS — I10 Essential (primary) hypertension: Secondary | ICD-10-CM | POA: Insufficient documentation

## 2018-06-18 DIAGNOSIS — N390 Urinary tract infection, site not specified: Secondary | ICD-10-CM

## 2018-06-18 DIAGNOSIS — R339 Retention of urine, unspecified: Secondary | ICD-10-CM | POA: Diagnosis not present

## 2018-06-18 DIAGNOSIS — E119 Type 2 diabetes mellitus without complications: Secondary | ICD-10-CM | POA: Diagnosis not present

## 2018-06-18 DIAGNOSIS — Z79899 Other long term (current) drug therapy: Secondary | ICD-10-CM | POA: Insufficient documentation

## 2018-06-18 DIAGNOSIS — Z794 Long term (current) use of insulin: Secondary | ICD-10-CM | POA: Diagnosis not present

## 2018-06-18 DIAGNOSIS — R338 Other retention of urine: Secondary | ICD-10-CM | POA: Diagnosis present

## 2018-06-18 DIAGNOSIS — R000 Tachycardia, unspecified: Secondary | ICD-10-CM | POA: Diagnosis not present

## 2018-06-18 LAB — URINALYSIS, ROUTINE W REFLEX MICROSCOPIC
BILIRUBIN URINE: NEGATIVE
GLUCOSE, UA: 150 mg/dL — AB
KETONES UR: NEGATIVE mg/dL
Nitrite: NEGATIVE
PH: 6 (ref 5.0–8.0)
Protein, ur: 100 mg/dL — AB
Specific Gravity, Urine: 1.005 (ref 1.005–1.030)
WBC, UA: >50 WBC/hpf — ABNORMAL HIGH (ref 0–5)

## 2018-06-18 MED ORDER — TAMSULOSIN HCL 0.4 MG PO CAPS
0.4000 mg | ORAL_CAPSULE | Freq: Every day | ORAL | Status: DC
  Administered 2018-06-19: 0.4 mg via ORAL
  Filled 2018-06-18: qty 1

## 2018-06-18 MED ORDER — CEFTRIAXONE SODIUM 1 G IJ SOLR
1.0000 g | Freq: Once | INTRAMUSCULAR | Status: AC
  Administered 2018-06-19: 1 g via INTRAMUSCULAR
  Filled 2018-06-18: qty 10

## 2018-06-18 NOTE — ED Provider Notes (Signed)
[facility] EMERGENCY DEPARTMENT Provider Note   CSN: [id number] Arrival date & time: 06/18/18  2057     History   Chief Complaint Chief Complaint  Patient presents with  . Urinary Retention    HPI Sara [name] is a 77 y.o. female.  The history is provided by the patient.  Dysuria   This is a new problem. The current episode started 6 to 12 hours ago. The problem occurs every urination. The problem has been resolved. Quality: pressure. The pain is severe. There has been no fever. She is not sexually active. Associated symptoms include hesitancy. Pertinent negatives include no chills, no sweats, no nausea, no discharge, no frequency and no possible pregnancy. Associated symptoms comments: Decreased ability to urinate. She has tried nothing for the symptoms. Her past medical history does not include kidney stones.    Past Medical History:  Diagnosis Date  . Allergy     Patient Active Problem List   Diagnosis Date Noted  . Dizziness 02/21/2018  . HTN (hypertension) 02/21/2018  . Routine general medical examination at a health care facility 01/13/2018  . Estrogen deficiency 01/13/2018  . Need for prophylactic vaccination against Streptococcus pneumoniae (pneumococcus) 01/13/2018  . Type 2 diabetes mellitus without complication, with long-term current use of insulin ([facility]) 01/13/2018  . Vision changes 01/13/2018    Past Surgical History:  Procedure Laterality Date  . DG THUMB RIGHT HAND ([facility] HX) Right      OB History   None      Home Medications    Prior to Admission medications   Medication Sig Start Date End Date Taking? Authorizing Provider  ACCU-CHEK SOFTCLIX LANCETS lancets Use as instructed. BD ultra-Fine Pen Needles 56mm x 32G 04/28/18   Lockamy, Timothy, DO  amLODipine (NORVASC) 5 MG tablet Take 1 tablet (5 mg total) by mouth daily. 05/05/18   Nuala Alpha, DO  glucose blood test strip Use as instructed 04/22/18   Nuala Alpha, DO    Insulin Glargine (LANTUS SOLOSTAR) 100 UNIT/ML Solostar Pen Inject 11 Units into the skin daily at 10 pm. 04/02/18   Nuala Alpha, DO  Insulin Pen Needle (PEN NEEDLES 3/16") 31G X 5 MM MISC Use as instructed to inject insulin once daily 05/22/18   Nuala Alpha, DO  metFORMIN (GLUCOPHAGE) 500 MG tablet Take 1 tablet (500 mg total) by mouth 2 (two) times daily with a meal. 05/05/18   Nuala Alpha, DO    Family History Family History  Problem Relation Age of Onset  . Alzheimer's disease Father   . Alzheimer's disease Brother   . Breast cancer Neg Hx     Social History Social History   Tobacco Use  . Smoking status: Never Smoker  . Smokeless tobacco: Never Used  Substance Use Topics  . Alcohol use: Never    Frequency: Never  . Drug use: Never     Allergies   Patient has no known allergies.   Review of Systems Review of Systems  Constitutional: Negative for chills and fever.  Eyes: Negative for photophobia.  Respiratory: Negative for shortness of breath.   Cardiovascular: Negative for chest pain.  Gastrointestinal: Negative for nausea.  Genitourinary: Positive for difficulty urinating, dysuria and hesitancy. Negative for frequency.  All other systems reviewed and are negative.    Physical Exam Updated Vital Signs BP (!) 182/116 (BP Location: Right Arm)   Pulse (!) 137   Temp 98.7 F (37.1 C) (Oral)   Resp 20   Ht 5'  1" (1.549 m)   Wt 59 kg   SpO2 100%   BMI 24.56 kg/m   Physical Exam  Constitutional: She is oriented to person, place, and time. She appears well-developed and well-nourished. No distress.  HENT:  Head: Normocephalic and atraumatic.  Mouth/Throat: No oropharyngeal exudate.  Eyes: Pupils are equal, round, and reactive to light. Conjunctivae are normal.  Neck: Normal range of motion. Neck supple.  Cardiovascular: Normal rate, regular rhythm, normal heart sounds and intact distal pulses.  Pulmonary/Chest: Effort normal and breath sounds  normal. No stridor. She has no wheezes. She has no rales.  Abdominal: Soft. Bowel sounds are normal. She exhibits no mass. There is no tenderness. There is no rebound and no guarding.  Musculoskeletal: Normal range of motion. She exhibits no edema.  Neurological: She is alert and oriented to person, place, and time. She displays normal reflexes.  Skin: Skin is warm and dry. Capillary refill takes less than 2 seconds.  Psychiatric: She has a normal mood and affect.     ED Treatments / Results  Labs (all labs ordered are listed, but only abnormal results are displayed) Results for orders placed or performed during the hospital encounter of 06/18/18  Urinalysis, Routine w reflex microscopic- may I&O cath if menses  Result Value Ref Range   Color, Urine YELLOW YELLOW   APPearance CLOUDY (A) CLEAR   Specific Gravity, Urine 1.005 1.005 - 1.030   pH 6.0 5.0 - 8.0   Glucose, UA 150 (A) NEGATIVE mg/dL   Hgb urine dipstick LARGE (A) NEGATIVE   Bilirubin Urine NEGATIVE NEGATIVE   Ketones, ur NEGATIVE NEGATIVE mg/dL   Protein, ur 100 (A) NEGATIVE mg/dL   Nitrite NEGATIVE NEGATIVE   Leukocytes, UA LARGE (A) NEGATIVE   RBC / HPF >50 (H) 0 - 5 RBC/hpf   WBC, UA >50 (H) 0 - 5 WBC/hpf   Bacteria, UA FEW (A) NONE SEEN   WBC Clumps PRESENT    Mucus PRESENT    No results found.  EKG None  Radiology No results found.  Procedures Procedures (including critical care time)  Medications Ordered in ED Medications  cefTRIAXone (ROCEPHIN) injection 1 g (has no administration in time range)  tamsulosin (FLOMAX) capsule 0.4 mg (has no administration in time range)       Final Clinical Impressions(s) / ED Diagnoses    Refer to urology for voiding trial in 7 days and foley removal.  Starting flomax and keflex QID x 7 days.  Strict return precautions given.    Return for fevers >100.4 unrelieved by medication, shortness of breath, intractable vomiting, or diarrhea,  Inability to tolerate  liquids or food, cough, altered mental status or any concerns. No signs of systemic illness or infection. The patient is nontoxic-appearing on exam and vital signs are within normal limits.  I have reviewed the triage vital signs and the nursing notes. Pertinent labs &imaging results that were available during my care of the patient were reviewed by me and considered in my medical decision making (see chart for details).  After history, exam, and medical workup I feel the patient has been appropriately medically screened and is safe for discharge home. Pertinent diagnoses were discussed with the patient. Patient was given return precautions.    Vivienne Sangiovanni, MD 06/18/18 [phone]

## 2018-06-18 NOTE — ED Notes (Signed)
Pt. waiting for a room in A11

## 2018-06-18 NOTE — ED Notes (Signed)
Gray tube in the lab for culture.

## 2018-06-18 NOTE — ED Triage Notes (Signed)
Pt presents with urinary retention that began today; last urination was at 5pm but wasn't able to empty bladder; denies any other symptoms just bladder pressure

## 2018-06-19 LAB — CBC WITH DIFFERENTIAL/PLATELET
Abs Immature Granulocytes: 0 10*3/uL (ref 0.0–0.1)
BASOS PCT: 1 %
Basophils Absolute: 0 10*3/uL (ref 0.0–0.1)
EOS ABS: 0.1 10*3/uL (ref 0.0–0.7)
EOS PCT: 2 %
HEMATOCRIT: 43.7 % (ref 36.0–46.0)
Hemoglobin: 13.7 g/dL (ref 12.0–15.0)
IMMATURE GRANULOCYTES: 0 %
LYMPHS ABS: 2 10*3/uL (ref 0.7–4.0)
Lymphocytes Relative: 23 %
MCH: 26 pg (ref 26.0–34.0)
MCHC: 31.4 g/dL (ref 30.0–36.0)
MCV: 83.1 fL (ref 78.0–100.0)
Monocytes Absolute: 1 10*3/uL (ref 0.1–1.0)
Monocytes Relative: 11 %
NEUTROS PCT: 63 %
Neutro Abs: 5.7 10*3/uL (ref 1.7–7.7)
PLATELETS: 285 10*3/uL (ref 150–400)
RBC: 5.26 MIL/uL — AB (ref 3.87–5.11)
RDW: 13.3 % (ref 11.5–15.5)
WBC: 8.9 10*3/uL (ref 4.0–10.5)

## 2018-06-19 LAB — I-STAT CHEM 8, ED
BUN: 19 mg/dL (ref 8–23)
CREATININE: 0.7 mg/dL (ref 0.44–1.00)
Calcium, Ion: 1.34 mmol/L (ref 1.15–1.40)
Chloride: 103 mmol/L (ref 98–111)
GLUCOSE: 184 mg/dL — AB (ref 70–99)
HEMATOCRIT: 43 % (ref 36.0–46.0)
HEMOGLOBIN: 14.6 g/dL (ref 12.0–15.0)
Potassium: 4.2 mmol/L (ref 3.5–5.1)
Sodium: 138 mmol/L (ref 135–145)
TCO2: 24 mmol/L (ref 22–32)

## 2018-06-19 MED ORDER — CEPHALEXIN 500 MG PO CAPS: 500.0000 mg | ORAL_CAPSULE | Freq: Four times a day (QID) | ORAL | 0 refills | Status: DC

## 2018-06-19 MED ORDER — LIDOCAINE HCL (PF) 1 % IJ SOLN
INTRAMUSCULAR | Status: AC
  Filled 2018-06-19: qty 5

## 2018-06-19 MED ORDER — TAMSULOSIN HCL 0.4 MG PO CAPS: 0.4000 mg | ORAL_CAPSULE | Freq: Every day | ORAL | 0 refills | Status: DC

## 2018-06-19 NOTE — ED Notes (Signed)
Attached leg bag to pt urinary foley catheter

## 2018-06-23 ENCOUNTER — Other Ambulatory Visit: Payer: Self-pay

## 2018-06-23 ENCOUNTER — Encounter: Payer: Self-pay | Admitting: Family Medicine

## 2018-06-23 ENCOUNTER — Ambulatory Visit (INDEPENDENT_AMBULATORY_CARE_PROVIDER_SITE_OTHER): Payer: Medicare HMO | Admitting: Family Medicine

## 2018-06-23 VITALS — BP 122/60 | HR 100 | Temp 97.9°F | Wt 125.0 lb

## 2018-06-23 DIAGNOSIS — Z794 Long term (current) use of insulin: Secondary | ICD-10-CM | POA: Diagnosis not present

## 2018-06-23 DIAGNOSIS — E1100 Type 2 diabetes mellitus with hyperosmolarity without nonketotic hyperglycemic-hyperosmolar coma (NKHHC): Secondary | ICD-10-CM

## 2018-06-23 DIAGNOSIS — G5793 Unspecified mononeuropathy of bilateral lower limbs: Secondary | ICD-10-CM | POA: Diagnosis not present

## 2018-06-23 DIAGNOSIS — I10 Essential (primary) hypertension: Secondary | ICD-10-CM | POA: Diagnosis not present

## 2018-06-23 DIAGNOSIS — E119 Type 2 diabetes mellitus without complications: Secondary | ICD-10-CM | POA: Diagnosis not present

## 2018-06-23 LAB — POCT GLYCOSYLATED HEMOGLOBIN (HGB A1C): HbA1c, POC (controlled diabetic range): 8.2 % — AB (ref 0.0–7.0)

## 2018-06-23 MED ORDER — ACCU-CHEK SOFTCLIX LANCETS MISC: 12 refills | Status: DC

## 2018-06-23 MED ORDER — GABAPENTIN 100 MG PO CAPS: 100.0000 mg | ORAL_CAPSULE | Freq: Every day | ORAL | 3 refills | Status: DC

## 2018-06-23 MED ORDER — METFORMIN HCL 500 MG PO TABS: 1000.0000 mg | ORAL_TABLET | Freq: Two times a day (BID) | ORAL | 3 refills | Status: DC

## 2018-06-23 MED ORDER — AMLODIPINE BESYLATE 5 MG PO TABS: 5.0000 mg | ORAL_TABLET | Freq: Every day | ORAL | 3 refills | Status: DC

## 2018-06-23 NOTE — Patient Instructions (Addendum)
It was great to see you today! Thank you for letting me participate in your care!  Today, we discussed your diabetes. It has improved significantly since your last visit. Please continue your healthy lifestyle of eating a well balanced diet and exercise. Please increase your protein intake by eating more baked white meat and fish. Also, start drinking one Ensure or Boost once a day three times per week (Monday, Wednesday, Friday).   I have refilled your blood pressure medication and your lancets. I am increasing your Metformin from 1000mg  per day to 2000mg  per day.  Continue taking your antibiotic for your UTI until it is finished. If you need anything please do not hesitate to call me.  Be well, [name], DO PGY-2, [facility]

## 2018-06-23 NOTE — Assessment & Plan Note (Signed)
Gabapentin 100mg  daily at night to help with tingling in her legs. Can increase to BID if no improvement.

## 2018-06-23 NOTE — Assessment & Plan Note (Signed)
Increasing Metformin to 2000mg  daily. She will take 500mg  x 2 BID. Her last BMP in 01/2018 showed normal kidney function.  Will follow closely. A1c in 3 months.

## 2018-06-23 NOTE — Progress Notes (Signed)
Subjective: Chief Complaint  Patient presents with  . Diabetes   HPI: Sara [name] is a 77 y.o. presenting to clinic today to discuss the following:  DM Follow up Improved but not yet at goal. She has been titrating up her Lantus every day that her morning BG is above 150. She is currently taking 17U at night and metformin 500mg  BID. She has no complaints and is doing well with diet and exercise. She has lost 4lbs since her last visit and is concerned that she may continue to lose weight. She has not tried taking Ensure or Boost. She is endorsing tingling in her legs at night recently.  HTN Well controlled. Needs refill on amlodipine.  UTI Patient had urinary retention and was seen in the ED on 8/22 and was found to have a UTI. She was given an IM dose of CTX and started on Keflex which she has been adherent to. She is doing well and has a foley in place which will be removed this Thursday. She has no urinary symptoms at today's visit and knows to finish taking the antibiotics. She was also started on Tamulosin.  Denies fever, chills, CVA tenderness, urinary burning or pain.  Health Maintenance: none today     ROS noted in HPI.   Past Medical, Surgical, Social, and Family History Reviewed & Updated per EMR.   Pertinent Historical Findings include:   Social History   Tobacco Use  Smoking Status Never Smoker  Smokeless Tobacco Never Used    Objective: BP 122/60   Pulse 100   Temp 97.9 F (36.6 C) (Oral)   Wt 125 lb (56.7 kg)   SpO2 99%   BMI 23.62 kg/m  Vitals and nursing notes reviewed  Physical Exam Gen: Alert and Oriented x 3, NAD HEENT: Normocephalic, atraumatic, PERRLA, EOMI, small stye located at the inner lower eyelid on the left CV: RRR, no murmurs, normal S1, S2 split Resp: CTAB, no wheezing, rales, or rhonchi, comfortable work of breathing MSK: Moves all four extremities Ext: no clubbing, cyanosis, or edema Neuro: No gross deficits Skin: warm,  dry, intact, no rashes  Results for orders placed or performed in visit on 06/23/18 (from the past 72 hour(s))  HgB A1c     Status: Abnormal   Collection Time: 06/23/18  9:32 AM  Result Value Ref Range   Hemoglobin A1C     HbA1c POC (<> result, manual entry)     HbA1c, POC (prediabetic range)     HbA1c, POC (controlled diabetic range) 8.2 (A) 0.0 - 7.0 %    Assessment/Plan:  HTN (hypertension) Well controlled at goal. Continue Amlodipine at 5mg . Refilled today.   Type 2 diabetes mellitus without complication, with long-term current use of insulin (HCC) Increasing Metformin to 2000mg  daily. She will take 500mg  x 2 BID. Her last BMP in 01/2018 showed normal kidney function.  Will follow closely. A1c in 3 months.  Neuropathic pain of both legs Gabapentin 100mg  daily at night to help with tingling in her legs. Can increase to BID if no improvement.  UTI Patient will finish Keflex given to her in ED and have foley removed by Neurology outpatient this week. She will call me with any concerns or issues.   PATIENT EDUCATION PROVIDED: See AVS    Diagnosis and plan along with any newly prescribed medication(s) were discussed in detail with this patient today. The patient verbalized understanding and agreed with the plan. Patient advised if symptoms worsen return  to clinic or ER.   Health Maintainance:   Orders Placed This Encounter  Procedures  . HgB A1c    Meds ordered this encounter  Medications  . amLODipine (NORVASC) 5 MG tablet    Sig: Take 1 tablet (5 mg total) by mouth daily.    Dispense:  90 tablet    Refill:  3  . metFORMIN (GLUCOPHAGE) 500 MG tablet    Sig: Take 2 tablets (1,000 mg total) by mouth 2 (two) times daily with a meal.    Dispense:  180 tablet    Refill:  3  . gabapentin (NEURONTIN) 100 MG capsule    Sig: Take 1 capsule (100 mg total) by mouth at bedtime.    Dispense:  90 capsule    Refill:  3  . ACCU-CHEK SOFTCLIX LANCETS lancets    Sig: Use as  instructed. BD ultra-Fine Pen Needles 95mm x 32G    Dispense:  100 each    Refill:  [place], DO 06/23/2018, 9:31 AM PGY-2 [place]

## 2018-06-23 NOTE — Assessment & Plan Note (Signed)
Well controlled at goal. Continue Amlodipine at 5mg . Refilled today.

## 2018-06-25 DIAGNOSIS — R338 Other retention of urine: Secondary | ICD-10-CM | POA: Diagnosis not present

## 2018-07-16 ENCOUNTER — Other Ambulatory Visit: Payer: Self-pay | Admitting: *Deleted

## 2018-07-16 DIAGNOSIS — E1100 Type 2 diabetes mellitus with hyperosmolarity without nonketotic hyperglycemic-hyperosmolar coma (NKHHC): Secondary | ICD-10-CM

## 2018-07-16 DIAGNOSIS — I10 Essential (primary) hypertension: Secondary | ICD-10-CM

## 2018-07-17 MED ORDER — METFORMIN HCL 500 MG PO TABS: 1000.0000 mg | ORAL_TABLET | Freq: Two times a day (BID) | ORAL | 3 refills | Status: DC

## 2018-07-17 MED ORDER — AMLODIPINE BESYLATE 5 MG PO TABS: 5.0000 mg | ORAL_TABLET | Freq: Every day | ORAL | 3 refills | Status: DC

## 2018-07-31 ENCOUNTER — Other Ambulatory Visit: Payer: Self-pay | Admitting: *Deleted

## 2018-07-31 MED ORDER — "PEN NEEDLES 3/16"" 31G X 5 MM MISC": 3 refills | Status: DC

## 2018-09-09 ENCOUNTER — Ambulatory Visit: Payer: Medicare HMO | Admitting: Family Medicine

## 2018-10-09 ENCOUNTER — Ambulatory Visit (INDEPENDENT_AMBULATORY_CARE_PROVIDER_SITE_OTHER): Payer: Medicare HMO | Admitting: Family Medicine

## 2018-10-09 ENCOUNTER — Encounter: Payer: Self-pay | Admitting: Family Medicine

## 2018-10-09 ENCOUNTER — Other Ambulatory Visit: Payer: Self-pay

## 2018-10-09 VITALS — BP 130/68 | HR 78 | Temp 97.9°F | Ht 61.0 in | Wt 133.6 lb

## 2018-10-09 DIAGNOSIS — E119 Type 2 diabetes mellitus without complications: Secondary | ICD-10-CM

## 2018-10-09 DIAGNOSIS — I10 Essential (primary) hypertension: Secondary | ICD-10-CM | POA: Diagnosis not present

## 2018-10-09 DIAGNOSIS — Z794 Long term (current) use of insulin: Secondary | ICD-10-CM | POA: Diagnosis not present

## 2018-10-09 LAB — POCT GLYCOSYLATED HEMOGLOBIN (HGB A1C): HBA1C, POC (CONTROLLED DIABETIC RANGE): 7.9 % — AB (ref 0.0–7.0)

## 2018-10-09 MED ORDER — GLUCOSE BLOOD VI STRP: ORAL_STRIP | 12 refills | Status: DC

## 2018-10-09 MED ORDER — AMLODIPINE BESYLATE 5 MG PO TABS: 5.0000 mg | ORAL_TABLET | Freq: Every day | ORAL | 3 refills | Status: DC

## 2018-10-09 MED ORDER — GABAPENTIN 100 MG PO CAPS: 100.0000 mg | ORAL_CAPSULE | Freq: Every day | ORAL | 3 refills | Status: DC

## 2018-10-09 MED ORDER — INSULIN GLARGINE 100 UNIT/ML SOLOSTAR PEN: 18.0000 [IU] | PEN_INJECTOR | Freq: Every day | SUBCUTANEOUS | 99 refills | Status: DC

## 2018-10-09 NOTE — Progress Notes (Signed)
     Subjective: Chief Complaint  Patient presents with  . Diabetes    HPI: Sara [name] is a 77 y.o. presenting to clinic today to discuss the following:  T2DM Patient presents for regular follow up for her A1c. Today she is at 7.9. She is checking her blood glucose at home and it typically ranges from 140-150s in the morning before meals. She takes 18U of Lantus in the morning along with Metformin. She denies any diarrhea or abdominal upset while taking metformin. She is doing better at avoiding sweets.  Health Maintenance: none     ROS noted in HPI.   Past Medical, Surgical, Social, and Family History Reviewed & Updated per EMR.   Pertinent Historical Findings include:   Social History   Tobacco Use  Smoking Status Never Smoker  Smokeless Tobacco Never Used    Objective: BP 130/68   Pulse 78   Temp 97.9 F (36.6 C) (Oral)   Ht 5\' 1"  (1.549 m)   Wt 133 lb 9.6 oz (60.6 kg)   SpO2 99%   BMI 25.24 kg/m  Vitals and nursing notes reviewed  Physical Exam Gen: Alert and Oriented x 3, NAD HEENT: Normocephalic, atraumatic CV: RRR, no murmurs, normal S1, S2 split Resp: CTAB, no wheezing, rales, or rhonchi, comfortable work of breathing Ext: no clubbing, cyanosis, or edema Neuro: No gross deficits Skin: warm, dry, intact, no rashes  Results for orders placed or performed in visit on 10/09/18 (from the past 72 hour(s))  HgB A1c     Status: Abnormal   Collection Time: 10/09/18  2:55 PM  Result Value Ref Range   Hemoglobin A1C     HbA1c POC (<> result, manual entry)     HbA1c, POC (prediabetic range)     HbA1c, POC (controlled diabetic range) 7.9 (A) 0.0 - 7.0 %    Assessment/Plan:  Type 2 diabetes mellitus without complication, with long-term current use of insulin (HCC) Needs slightly better control as I would like her to get to 7.0-7.5.  - cont Metformin 2000mg  daily - Increase Lantus to 20U daily - Refilled strips and Gabapentin for neuropathic pain -  F/u in 6 months   PATIENT EDUCATION PROVIDED: See AVS    Diagnosis and plan along with any newly prescribed medication(s) were discussed in detail with this patient today. The patient verbalized understanding and agreed with the plan. Patient advised if symptoms worsen return to clinic or ER.   Health Maintainance:   Orders Placed This Encounter  Procedures  . HgB A1c    Meds ordered this encounter  Medications  . gabapentin (NEURONTIN) 100 MG capsule    Sig: Take 1 capsule (100 mg total) by mouth at bedtime.    Dispense:  90 capsule    Refill:  3  . glucose blood test strip    Sig: Use as instructed    Dispense:  100 each    Refill:  12  . amLODipine (NORVASC) 5 MG tablet    Sig: Take 1 tablet (5 mg total) by mouth daily.    Dispense:  90 tablet    Refill:  Naomi, DO 10/09/2018, 3:25 PM PGY-2 [place]

## 2018-10-09 NOTE — Patient Instructions (Signed)
It was great to see you today! Thank you for letting me participate in your care!  Today, we discussed your diabetes and we are very close to having it under control. I have increased your Lantus to 20U per day in the morning. Continue taking Metformin.  I will follow up with you in 6 months. Continue taking your blood pressure medications as your blood pressure is well controlled. If you have any issues getting your medications please let me know.  Have a Merry Christmas and a Happy New Year!  Be well, [name], DO PGY-2, [facility]

## 2018-10-24 NOTE — Assessment & Plan Note (Signed)
Needs slightly better control as I would like her to get to 7.0-7.5.  - cont Metformin 2000mg  daily - Increase Lantus to 20U daily - Refilled strips and Gabapentin for neuropathic pain - F/u in 6 months

## 2018-11-24 ENCOUNTER — Other Ambulatory Visit: Payer: Self-pay

## 2018-11-24 MED ORDER — "PEN NEEDLES 3/16"" 31G X 5 MM MISC": 3 refills | Status: DC

## 2018-12-12 ENCOUNTER — Other Ambulatory Visit: Payer: Self-pay

## 2018-12-12 DIAGNOSIS — Z794 Long term (current) use of insulin: Principal | ICD-10-CM

## 2018-12-12 DIAGNOSIS — E119 Type 2 diabetes mellitus without complications: Secondary | ICD-10-CM

## 2018-12-12 MED ORDER — ACCU-CHEK SOFTCLIX LANCETS MISC: 12 refills | Status: DC

## 2018-12-16 ENCOUNTER — Other Ambulatory Visit: Payer: Self-pay

## 2018-12-16 DIAGNOSIS — Z794 Long term (current) use of insulin: Principal | ICD-10-CM

## 2018-12-16 DIAGNOSIS — E119 Type 2 diabetes mellitus without complications: Secondary | ICD-10-CM

## 2018-12-16 MED ORDER — ACCU-CHEK SOFTCLIX LANCETS MISC: 12 refills | Status: DC

## 2018-12-17 ENCOUNTER — Other Ambulatory Visit: Payer: Self-pay

## 2018-12-17 DIAGNOSIS — Z794 Long term (current) use of insulin: Principal | ICD-10-CM

## 2018-12-17 DIAGNOSIS — E119 Type 2 diabetes mellitus without complications: Secondary | ICD-10-CM

## 2018-12-17 MED ORDER — ACCU-CHEK SOFTCLIX LANCETS MISC: 1.0000 | Freq: Three times a day (TID) | 12 refills | Status: DC

## 2018-12-17 MED ORDER — GLUCOSE BLOOD VI STRP: 1.0000 | ORAL_STRIP | Freq: Three times a day (TID) | 12 refills | Status: DC

## 2018-12-17 NOTE — Telephone Encounter (Signed)
Fax from [facility], test strips and lancets need specific testing frequency on the prescription. Please resend.  [name], RN [facility] RN)

## 2019-01-19 ENCOUNTER — Telehealth: Payer: Self-pay | Admitting: Family Medicine

## 2019-01-19 NOTE — Telephone Encounter (Signed)
Pt is returning Dr. Arlana Pouch call. She said that would have no problem doing her visit by phone instead of coming in that day. She will be by her phone on 01/27/19 at 1:50 to have her appointment.

## 2019-01-27 ENCOUNTER — Telehealth: Payer: Self-pay | Admitting: Family Medicine

## 2019-01-27 ENCOUNTER — Telehealth: Payer: Medicare HMO

## 2019-01-27 ENCOUNTER — Ambulatory Visit: Payer: Medicare HMO | Admitting: Family Medicine

## 2019-01-27 ENCOUNTER — Other Ambulatory Visit: Payer: Self-pay

## 2019-01-27 NOTE — Telephone Encounter (Signed)
Attempted to call patient for telemedicine appointment and no answer.  Left VM to call clinic if would still like to have appointment.

## 2019-02-02 ENCOUNTER — Telehealth (INDEPENDENT_AMBULATORY_CARE_PROVIDER_SITE_OTHER): Payer: Medicare HMO | Admitting: Family Medicine

## 2019-02-02 ENCOUNTER — Other Ambulatory Visit: Payer: Self-pay

## 2019-02-02 DIAGNOSIS — E1100 Type 2 diabetes mellitus with hyperosmolarity without nonketotic hyperglycemic-hyperosmolar coma (NKHHC): Secondary | ICD-10-CM

## 2019-02-02 DIAGNOSIS — G5793 Unspecified mononeuropathy of bilateral lower limbs: Secondary | ICD-10-CM

## 2019-02-02 DIAGNOSIS — I10 Essential (primary) hypertension: Secondary | ICD-10-CM | POA: Diagnosis not present

## 2019-02-02 DIAGNOSIS — Z794 Long term (current) use of insulin: Secondary | ICD-10-CM | POA: Diagnosis not present

## 2019-02-02 DIAGNOSIS — E119 Type 2 diabetes mellitus without complications: Secondary | ICD-10-CM | POA: Diagnosis not present

## 2019-02-02 MED ORDER — AMLODIPINE BESYLATE 5 MG PO TABS: 5.0000 mg | ORAL_TABLET | Freq: Every day | ORAL | 1 refills | Status: DC

## 2019-02-02 MED ORDER — METFORMIN HCL 500 MG PO TABS: 1000.0000 mg | ORAL_TABLET | Freq: Two times a day (BID) | ORAL | 1 refills | Status: DC

## 2019-02-02 MED ORDER — GABAPENTIN 100 MG PO CAPS: 100.0000 mg | ORAL_CAPSULE | Freq: Every day | ORAL | 1 refills | Status: DC

## 2019-02-02 MED ORDER — INSULIN GLARGINE 100 UNIT/ML SOLOSTAR PEN: 22.0000 [IU] | PEN_INJECTOR | Freq: Every day | SUBCUTANEOUS | 1 refills | Status: DC

## 2019-02-02 NOTE — Assessment & Plan Note (Signed)
Well controlled. Continue amlodipine 

## 2019-02-02 NOTE — Progress Notes (Signed)
[facility] Telemedicine Visit  Patient consented to have visit conducted via telephone.  Encounter participants: Patient: Sara [name]  Provider: Chrisandra Netters  Others (if applicable): n/a  Chief Complaint: diabetes, hypertension, medication refill   HPI:  Diabetes - currently taking lantus 22 units per day. Sugars are running 130s-140s fasting. No low sugars, lowest was 112.  Neuropathy - Was on gabapentin for diabetic neuropathy which she felt like helped. Tolerated the gabapentin well. No issues with it other than she ran out and needs refill.  Hypertension - currently taking amlodipine 5mg  daily. Checks blood pressure at home and gets 130s/70s. Feels well in general.  She is currently staying home. 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  ROS: per HPI  Pertinent PMHx: history of diabetes, hypertension   Exam:  Respiratory: Patient speaking normally in full sentences throughout the phone encounter, without any respiratory distress evident.   Assessment/Plan:  Neuropathic pain of both legs Refill gabapentin since patient is out and she perceived improvement with it.  HTN (hypertension) Well controlled. Continue amlodipine.  Type 2 diabetes mellitus without complication, with long-term current use of insulin ([employer]) Room to improve based on fasting sugars Will increase lantus to 22 units per day Patient to call if any low sugars She will otherwise call in 2 months for an appointment, at which time hopefully COVID pandemic will have calmed down and she can safely be seen in the office.   Time spent on phone with patient: 9 minutes

## 2019-02-02 NOTE — Assessment & Plan Note (Signed)
Refill gabapentin since patient is out and she perceived improvement with it.

## 2019-02-02 NOTE — Assessment & Plan Note (Signed)
Room to improve based on fasting sugars Will increase lantus to 22 units per day Patient to call if any low sugars She will otherwise call in 2 months for an appointment, at which time hopefully COVID pandemic will have calmed down and she can safely be seen in the office.

## 2019-03-09 ENCOUNTER — Other Ambulatory Visit: Payer: Self-pay

## 2019-03-09 MED ORDER — "PEN NEEDLES 3/16"" 31G X 5 MM MISC": 3 refills | Status: DC

## 2019-03-25 ENCOUNTER — Other Ambulatory Visit: Payer: Self-pay | Admitting: Family Medicine

## 2019-03-25 DIAGNOSIS — Z1231 Encounter for screening mammogram for malignant neoplasm of breast: Secondary | ICD-10-CM

## 2019-05-13 ENCOUNTER — Ambulatory Visit
Admission: RE | Admit: 2019-05-13 | Discharge: 2019-05-13 | Disposition: A | Discharge status: Home / Self Care | Payer: Medicare HMO | Source: Ambulatory Visit | Attending: *Deleted | Admitting: *Deleted

## 2019-05-13 ENCOUNTER — Other Ambulatory Visit: Payer: Self-pay

## 2019-05-13 DIAGNOSIS — Z1231 Encounter for screening mammogram for malignant neoplasm of breast: Secondary | ICD-10-CM | POA: Diagnosis not present

## 2019-05-27 ENCOUNTER — Ambulatory Visit (INDEPENDENT_AMBULATORY_CARE_PROVIDER_SITE_OTHER): Payer: Medicare HMO | Admitting: Family Medicine

## 2019-05-27 ENCOUNTER — Other Ambulatory Visit: Payer: Self-pay

## 2019-05-27 VITALS — BP 122/80 | HR 83

## 2019-05-27 DIAGNOSIS — M25561 Pain in right knee: Secondary | ICD-10-CM

## 2019-05-27 DIAGNOSIS — I10 Essential (primary) hypertension: Secondary | ICD-10-CM

## 2019-05-27 DIAGNOSIS — Z794 Long term (current) use of insulin: Secondary | ICD-10-CM | POA: Diagnosis not present

## 2019-05-27 DIAGNOSIS — E119 Type 2 diabetes mellitus without complications: Secondary | ICD-10-CM | POA: Diagnosis not present

## 2019-05-27 LAB — POCT GLYCOSYLATED HEMOGLOBIN (HGB A1C): HbA1c, POC (controlled diabetic range): 7.8 % — AB (ref 0.0–7.0)

## 2019-05-27 MED ORDER — MELOXICAM 15 MG PO TABS: 15.0000 mg | ORAL_TABLET | Freq: Every day | ORAL | 0 refills | Status: DC

## 2019-05-27 MED ORDER — GABAPENTIN 100 MG PO CAPS: 100.0000 mg | ORAL_CAPSULE | Freq: Every day | ORAL | 1 refills | Status: DC

## 2019-05-27 MED ORDER — AMLODIPINE BESYLATE 5 MG PO TABS: 5.0000 mg | ORAL_TABLET | Freq: Every day | ORAL | 1 refills | Status: DC

## 2019-05-27 NOTE — Patient Instructions (Signed)
It was great to see you today! Thank you for letting me participate in your care!  Today, we discussed your Diabetes and you are doing well controlling your blood sugar. Please keep up the physical activity and watching what you eat. I did not make any changes to your medications today.  I did refill your Gabapentin and I also refilled you amlodipine.  For your right knee pain I think it is most likely arthritis. I have sent in a prescription for a medication called Meloxicam to take once a day with meals for 14 days. You can stop it if you feel it is not helping you. Please call me back in 2 weeks to let me know how you are doing.  Be well, [name], DO PGY-3, [facility]

## 2019-05-27 NOTE — Progress Notes (Signed)
Subjective: Chief Complaint  Patient presents with  . Diabetes    HPI: Sara [name] is a 78 y.o. presenting to clinic today to discuss the following:  DMT2 Patient presents today for follow up for management of her T2DM. She has been taking Insulin 22U per day once in the morning with a meal and then metformin 1000mg  BID. She is/is not checking her BG at home and states in the mornings fasting it is usually around the 130-150s. She is having no complaints today and overall feels well.  Right Knee Pain She noticed a few days ago some right knee pain and swelling. It is intermittent and does not seem to be associated with activity. She states it does not limit her activity but does bother her. She does not feel unstable and has had no falls or traumatic injuries to the area. She endorses no warmth, rash at or around the right knee. No skin changes. She says she gets the pain in the morning and at night. The pain lasts around an hour or so and she takes Alleve and Tylenol which helps some but doesn't really take care of the pain.  ROS noted in HPI.   Past Medical, Surgical, Social, and Family History Reviewed & Updated per EMR.   Pertinent Historical Findings include:   Social History   Tobacco Use  Smoking Status Never Smoker  Smokeless Tobacco Never Used    Objective: BP 122/80   Pulse 83   SpO2 99%  Vitals and nursing notes reviewed  Physical Exam Gen: Alert and Oriented x 3, NAD CV: RRR, no murmurs, normal S1, S2 split Resp: CTAB, no wheezing, rales, or rhonchi, comfortable work of breathing Abd: non-distended, non-tender, soft, +bs in all four quadrants MSK: Knee, Right: TTP noted at the medial joint line. Inspection was negative for erythema, ecchymosis, but positive for effusion. No obvious bony abnormalities or signs of osteophyte development. Palpation yielded no asymmetric warmth; Positive for medial joint line tenderness; No condyle tenderness; No patellar  tenderness; Positive for patellar crepitus. Patellar and quadriceps tendons unremarkable, and no tenderness of the pes anserine bursa. No obvious Baker's cyst development. ROM normal in flexion (135 degrees) and extension (0 degrees). Normal hamstring and quadriceps strength. Neurovascularly intact bilaterally.  - Ligaments: (Solid and consistent endpoints)   - ACL (present bilaterally)   - PCL (present bilaterally)   - LCL (present bilaterally)   - MCL (present bilaterally).   - Additional tests performed:    - Anterior Drawer >> NEG  - Meniscus:   - McMurray's: NEG  - Patella:   - Patellar grind/compression: NEG   - Patellar glide: Without apprehension Ext: no clubbing, cyanosis, or edema Skin: warm, dry, intact, no rashes   Results for orders placed or performed in visit on 05/27/19 (from the past 72 hour(s))  HgB A1c     Status: Abnormal   Collection Time: 05/27/19 10:58 AM  Result Value Ref Range   Hemoglobin A1C     HbA1c POC (<> result, manual entry)     HbA1c, POC (prediabetic range)     HbA1c, POC (controlled diabetic range) 7.8 (A) 0.0 - 7.0 %    Assessment/Plan:  HTN (hypertension) Blood pressure remains well controlled at today's visit below goal of 140/90 - Cont amlodipine 10mg  daily at night time  Type 2 diabetes mellitus without complication, with long-term current use of insulin ([place]) Patient now on 24U of Lantus daytime. She did not bring her glucose  monitor and has no fasting BG diary with her today. However, she reports number in the 130-150s and her A1c is 7.8% today. Depending on what guidelines one chooses to follow at the age of 52 anything below 8% is considered adequate control. We discussed continuing to check her BG fasting in the mornings and if it is every over 180 to increase her Lantus by 2U the next morning. Overall, she appears to be doing well as she states she only had one reading in the past few months over 200. - Cont Lantus 24U daily in the am  - cont Metformin 1000mg  BID  Right knee pain Given her age with no history of trauma or falls with mild swelling this is most likely the first signs of OA. - Meloxicam once per day for 14 days with a meal - If no improvement I will get x-rays and then start with physical therapy   PATIENT EDUCATION PROVIDED: See AVS    Diagnosis and plan along with any newly prescribed medication(s) were discussed in detail with this patient today. The patient verbalized understanding and agreed with the plan. Patient advised if symptoms worsen return to clinic or ER.    Orders Placed This Encounter  Procedures  . HgB A1c    Meds ordered this encounter  Medications  . gabapentin (NEURONTIN) 100 MG capsule    Sig: Take 1 capsule (100 mg total) by mouth at bedtime.    Dispense:  90 capsule    Refill:  1  . amLODipine (NORVASC) 5 MG tablet    Sig: Take 1 tablet (5 mg total) by mouth daily.    Dispense:  90 tablet    Refill:  1  . meloxicam (MOBIC) 15 MG tablet    Sig: Take 1 tablet (15 mg total) by mouth daily.    Dispense:  14 tablet    Refill:  0     Sara Rutherford, DO 05/27/2019, 10:44 AM PGY-3 [place]

## 2019-05-29 DIAGNOSIS — M25561 Pain in right knee: Secondary | ICD-10-CM | POA: Insufficient documentation

## 2019-05-29 HISTORY — DX: Pain in right knee: M25.561

## 2019-05-29 NOTE — Assessment & Plan Note (Signed)
Given her age with no history of trauma or falls with mild swelling this is most likely the first signs of OA. - Meloxicam once per day for 14 days with a meal - If no improvement I will get x-rays and then start with physical therapy

## 2019-05-29 NOTE — Assessment & Plan Note (Signed)
Blood pressure remains well controlled at today's visit below goal of 140/90 - Cont amlodipine 10mg  daily at night time

## 2019-05-29 NOTE — Assessment & Plan Note (Signed)
Patient now on 24U of Lantus daytime. She did not bring her glucose monitor and has no fasting BG diary with her today. However, she reports number in the 130-150s and her A1c is 7.8% today. Depending on what guidelines one chooses to follow at the age of 62 anything below 8% is considered adequate control. We discussed continuing to check her BG fasting in the mornings and if it is every over 180 to increase her Lantus by 2U the next morning. Overall, she appears to be doing well as she states she only had one reading in the past few months over 200. - Cont Lantus 24U daily in the am - cont Metformin 1000mg  BID

## 2019-06-10 ENCOUNTER — Telehealth: Payer: Medicare HMO | Admitting: Family Medicine

## 2019-06-10 ENCOUNTER — Other Ambulatory Visit: Payer: Self-pay

## 2019-06-16 ENCOUNTER — Other Ambulatory Visit: Payer: Self-pay | Admitting: *Deleted

## 2019-06-16 MED ORDER — "PEN NEEDLES 3/16"" 31G X 5 MM MISC": 3 refills | Status: DC

## 2019-07-25 DIAGNOSIS — R51 Headache: Secondary | ICD-10-CM | POA: Diagnosis not present

## 2019-07-25 DIAGNOSIS — Z20828 Contact with and (suspected) exposure to other viral communicable diseases: Secondary | ICD-10-CM | POA: Diagnosis not present

## 2019-07-27 DIAGNOSIS — Z1159 Encounter for screening for other viral diseases: Secondary | ICD-10-CM | POA: Diagnosis not present

## 2019-08-12 ENCOUNTER — Encounter: Payer: Self-pay | Admitting: Family Medicine

## 2019-08-12 ENCOUNTER — Other Ambulatory Visit: Payer: Self-pay

## 2019-08-12 ENCOUNTER — Ambulatory Visit (INDEPENDENT_AMBULATORY_CARE_PROVIDER_SITE_OTHER): Payer: Medicare HMO | Admitting: Family Medicine

## 2019-08-12 VITALS — BP 140/72 | HR 83 | Ht 61.0 in | Wt 132.4 lb

## 2019-08-12 DIAGNOSIS — E119 Type 2 diabetes mellitus without complications: Secondary | ICD-10-CM | POA: Diagnosis not present

## 2019-08-12 DIAGNOSIS — Z794 Long term (current) use of insulin: Secondary | ICD-10-CM

## 2019-08-12 DIAGNOSIS — I10 Essential (primary) hypertension: Secondary | ICD-10-CM

## 2019-08-12 DIAGNOSIS — Z23 Encounter for immunization: Secondary | ICD-10-CM | POA: Diagnosis not present

## 2019-08-12 LAB — POCT UA - MICROALBUMIN
Creatinine, POC: 300 mg/dL
Microalbumin Ur, POC: 80 mg/L

## 2019-08-12 LAB — POCT GLYCOSYLATED HEMOGLOBIN (HGB A1C): HbA1c, POC (controlled diabetic range): 8 % — AB (ref 0.0–7.0)

## 2019-08-12 MED ORDER — AMLODIPINE BESYLATE 10 MG PO TABS: 10.0000 mg | ORAL_TABLET | Freq: Every day | ORAL | 3 refills | Status: DC

## 2019-08-12 NOTE — Progress Notes (Signed)
     Subjective: Chief Complaint  Patient presents with  . Follow-up    HPI: Sara [name] is a 78 y.o. presenting to clinic today to discuss the following:  T2DM Patient appears to be doing well and states she continues to check her blood sugar every morning before eating. She states it has not gone over 200 since our last appointment and the average is between 130-150. She is no taking 28U of Lantus each morning and that has been stable for the past few weeks.   HTN Patient has been checking her blood pressure every morning at rest and her systolic numbers are around 140-150s most every morning. Never anything over 180. She has no vision changes, no headaches, no changes in her urinary frequency, no chest pain or shortness of breath.  Health Maintenance: diabetic foot exam, urine albumin/creatnine ratio, eye exam, PNA vaccine, influenza vaccine     ROS noted in HPI.    Social History   Tobacco Use  Smoking Status Never Smoker  Smokeless Tobacco Never Used   Objective: BP 140/72   Pulse 83   Ht 5\' 1"  (1.549 m)   Wt 132 lb 6.4 oz (60.1 kg)   SpO2 99%   BMI 25.02 kg/m  Vitals and nursing notes reviewed  Physical Exam Gen: Alert and Oriented x 3, NAD CV: RRR, no murmurs, normal S1, S2 split Resp: CTAB, no wheezing, rales, or rhonchi, comfortable work of breathing Ext: no clubbing, cyanosis, or edema Skin: warm, dry, intact, no rashes  Results for orders placed or performed in visit on 08/12/19 (from the past 72 hour(s))  HgB A1c     Status: Abnormal   Collection Time: 08/12/19  3:38 PM  Result Value Ref Range   Hemoglobin A1C     HbA1c POC (<> result, manual entry)     HbA1c, POC (prediabetic range)     HbA1c, POC (controlled diabetic range) 8.0 (A) 0.0 - 7.0 %    Assessment/Plan:  HTN (hypertension) HTN not quite to goal with systolics in the [id number] to [id number]. However, well controlled at today's visit. - Cont Amloidipine 10mg  daily to be taken at night time.  - F/u in 3 months  Type 2 diabetes mellitus without complication, with long-term current use of insulin (HCC) Borderline controlled with A1c of 8.0%. Counseled patient that we have no more wiggle room and to watch her diet over the next 3 months.  - If A1c >8% at next visit consider adding SLGT2 (jardiance) or GLP-1 agonist (Liraglutide) medication   PATIENT EDUCATION PROVIDED: See AVS    Diagnosis and plan along with any newly prescribed medication(s) were discussed in detail with this patient today. The patient verbalized understanding and agreed with the plan. Patient advised if symptoms worsen return to clinic or ER.   Health Maintainance:diabetic foot exam, urine albumin/creatnine ratio, eye exam, PNA vaccine, influenza vaccine     Orders Placed This Encounter  Procedures  . HgB A1c    Meds ordered this encounter  Medications  . amLODipine (NORVASC) 10 MG tablet    Sig: Take 1 tablet (10 mg total) by mouth daily.    Dispense:  30 tablet    Refill:  [place], DO 08/12/2019, 3:45 PM PGY-3 [place]

## 2019-08-12 NOTE — Patient Instructions (Signed)
It was great to see you today! Thank you for letting me participate in your care!  Today, we discussed your diabetes and although your A1c slightly went up I think it is still under good control given your age. I will not adjust anything at this time but be very careful with your diet to avoid sugary foods and snacks.  I have increased your blood pressure medication today. I will see you back in one month to see if it has improved your function.  Please see your eye doctor to have your annual diabetes eye exam.  Be well, [name], DO PGY-3, [facility]

## 2019-08-19 NOTE — Assessment & Plan Note (Addendum)
HTN not quite to goal with systolics in the XX123456 to Q000111Q. However, well controlled at today's visit. - Cont Amloidipine 10mg  daily to be taken at night time. - F/u in 3 months

## 2019-08-19 NOTE — Assessment & Plan Note (Signed)
Borderline controlled with A1c of 8.0%. Counseled patient that we have no more wiggle room and to watch her diet over the next 3 months.  - If A1c >8% at next visit consider adding SLGT2 (jardiance) or GLP-1 agonist (Liraglutide) medication

## 2019-10-26 ENCOUNTER — Other Ambulatory Visit: Payer: Self-pay

## 2019-10-26 MED ORDER — GABAPENTIN 100 MG PO CAPS: 100.0000 mg | ORAL_CAPSULE | Freq: Every day | ORAL | 1 refills | Status: DC

## 2019-10-26 MED ORDER — "PEN NEEDLES 3/16"" 31G X 5 MM MISC": 3 refills | Status: DC

## 2019-11-16 ENCOUNTER — Ambulatory Visit (INDEPENDENT_AMBULATORY_CARE_PROVIDER_SITE_OTHER): Payer: Medicare (Managed Care) | Admitting: Family Medicine

## 2019-11-16 ENCOUNTER — Other Ambulatory Visit: Payer: Self-pay

## 2019-11-16 VITALS — BP 123/80 | HR 91 | Wt 128.2 lb

## 2019-11-16 DIAGNOSIS — Z794 Long term (current) use of insulin: Secondary | ICD-10-CM | POA: Diagnosis not present

## 2019-11-16 DIAGNOSIS — E119 Type 2 diabetes mellitus without complications: Secondary | ICD-10-CM

## 2019-11-16 LAB — POCT GLYCOSYLATED HEMOGLOBIN (HGB A1C): HbA1c, POC (controlled diabetic range): 7.5 % — AB (ref 0.0–7.0)

## 2019-11-16 NOTE — Assessment & Plan Note (Signed)
Well controlled at today's visit with A1c of 7.5%. Given her age no need to try and push her A1c further down as data for benefit is limited. - Cont metformin 2000mg  daily - Cont Lantus 22U daily in pm - F/u in 6 months

## 2019-11-16 NOTE — Patient Instructions (Signed)
Patient declined avs

## 2019-11-16 NOTE — Progress Notes (Signed)
     Subjective: Chief Complaint  Patient presents with  . Diabetes    HPI: Sara [name] is a 79 y.o. presenting to clinic today to discuss the following:  T2DM Patient states overall she is feeling well and has no complaints. Her bilateral foot pain and burning has also improved. She is still using gabapentin. Also, a friend of her's at church recommended placing a bar of ivory soap at the foot of her bed at night and she states this has also helped her foot pain. No chest pain, SOB, difficulty breathing, nausea, vomiting, or abdominal pain.     ROS noted in HPI.    Social History   Tobacco Use  Smoking Status Never Smoker  Smokeless Tobacco Never Used    Objective: BP 123/80   Pulse 91   Wt 128 lb 3.2 oz (58.2 kg)   SpO2 98%   BMI 24.22 kg/m  Vitals and nursing notes reviewed  Physical Exam Gen: Alert and Oriented x 3, NAD CV: RRR, no murmurs, normal S1, S2 split Resp: CTAB, no wheezing, rales, or rhonchi, comfortable work of breathing Abd: non-distended, non-tender, soft, +bs in all four quadrants Ext: no clubbing, cyanosis, or edema Skin: warm, dry, intact, no rashes  Results for orders placed or performed in visit on 11/16/19 (from the past 72 hour(s))  HgB A1c     Status: Abnormal   Collection Time: 11/16/19  9:10 AM  Result Value Ref Range   Hemoglobin A1C     HbA1c POC (<> result, manual entry)     HbA1c, POC (prediabetic range)     HbA1c, POC (controlled diabetic range) 7.5 (A) 0.0 - 7.0 %    Assessment/Plan:  Type 2 diabetes mellitus without complication, with long-term current use of insulin (HCC) Well controlled at today's visit with A1c of 7.5%. Given her age no need to try and push her A1c further down as data for benefit is limited. - Cont metformin 2000mg  daily - Cont Lantus 22U daily in pm - F/u in 6 months     PATIENT EDUCATION PROVIDED: See AVS    Diagnosis and plan along with any newly prescribed medication(s) were discussed in  detail with this patient today. The patient verbalized understanding and agreed with the plan. Patient advised if symptoms worsen return to clinic or ER.    Orders Placed This Encounter  Procedures  . HgB A1c    No orders of the defined types were placed in this encounter.    [name], DO 11/16/2019, 11:37 AM PGY-3 [place]

## 2019-12-07 ENCOUNTER — Other Ambulatory Visit: Payer: Self-pay

## 2019-12-07 DIAGNOSIS — I10 Essential (primary) hypertension: Secondary | ICD-10-CM

## 2019-12-07 MED ORDER — AMLODIPINE BESYLATE 10 MG PO TABS: 10.0000 mg | ORAL_TABLET | Freq: Every day | ORAL | 3 refills | Status: DC

## 2019-12-07 MED ORDER — LANTUS SOLOSTAR 100 UNIT/ML ~~LOC~~ SOPN: 22.0000 [IU] | PEN_INJECTOR | Freq: Every day | SUBCUTANEOUS | 1 refills | Status: DC

## 2020-01-05 ENCOUNTER — Other Ambulatory Visit: Payer: Self-pay

## 2020-01-05 DIAGNOSIS — Z794 Long term (current) use of insulin: Secondary | ICD-10-CM

## 2020-01-05 DIAGNOSIS — I10 Essential (primary) hypertension: Secondary | ICD-10-CM

## 2020-01-05 DIAGNOSIS — E119 Type 2 diabetes mellitus without complications: Secondary | ICD-10-CM

## 2020-01-05 MED ORDER — AMLODIPINE BESYLATE 10 MG PO TABS: 10.0000 mg | ORAL_TABLET | Freq: Every day | ORAL | 3 refills | Status: DC

## 2020-01-05 MED ORDER — GLUCOSE BLOOD VI STRP: 1.0000 | ORAL_STRIP | Freq: Three times a day (TID) | 12 refills | Status: DC

## 2020-02-05 ENCOUNTER — Other Ambulatory Visit: Payer: Self-pay | Admitting: Family Medicine

## 2020-02-05 DIAGNOSIS — Z794 Long term (current) use of insulin: Secondary | ICD-10-CM

## 2020-02-05 DIAGNOSIS — E119 Type 2 diabetes mellitus without complications: Secondary | ICD-10-CM

## 2020-05-17 ENCOUNTER — Other Ambulatory Visit: Payer: Self-pay | Admitting: Family Medicine

## 2020-05-17 DIAGNOSIS — Z1231 Encounter for screening mammogram for malignant neoplasm of breast: Secondary | ICD-10-CM

## 2020-05-24 ENCOUNTER — Other Ambulatory Visit: Payer: Self-pay

## 2020-05-24 ENCOUNTER — Ambulatory Visit
Admission: RE | Admit: 2020-05-24 | Discharge: 2020-05-24 | Disposition: A | Discharge status: Home / Self Care | Payer: Medicare HMO | Source: Ambulatory Visit | Attending: Family Medicine | Admitting: Family Medicine

## 2020-05-24 DIAGNOSIS — Z1231 Encounter for screening mammogram for malignant neoplasm of breast: Secondary | ICD-10-CM

## 2020-05-25 ENCOUNTER — Other Ambulatory Visit: Payer: Self-pay

## 2020-05-25 ENCOUNTER — Ambulatory Visit (INDEPENDENT_AMBULATORY_CARE_PROVIDER_SITE_OTHER): Payer: Medicare HMO | Admitting: Family Medicine

## 2020-05-25 ENCOUNTER — Ambulatory Visit: Payer: Medicare (Managed Care)

## 2020-05-25 VITALS — BP 140/56 | HR 61 | Ht 61.0 in | Wt 130.5 lb

## 2020-05-25 DIAGNOSIS — E119 Type 2 diabetes mellitus without complications: Secondary | ICD-10-CM

## 2020-05-25 DIAGNOSIS — Z794 Long term (current) use of insulin: Secondary | ICD-10-CM

## 2020-05-25 LAB — POCT GLYCOSYLATED HEMOGLOBIN (HGB A1C): HbA1c, POC (controlled diabetic range): 8 % — AB (ref 0.0–7.0)

## 2020-05-25 MED ORDER — CANAGLIFLOZIN 300 MG PO TABS: 300.0000 mg | ORAL_TABLET | Freq: Every day | ORAL | 3 refills | Status: DC

## 2020-05-25 NOTE — Assessment & Plan Note (Addendum)
Repeat HbA1c today 8.0% up from previous measurement of 7.5%. -Continue Metformin 2000 mg daily (1000 mg twice daily) -Continue Lantus 22 units daily in the evening -BMP checked today -Started on Invokana, instructed to follow-up 1-2 weeks for repeat BMP -Follow-up 3 months for repeat HbA1c -Instructed to follow-up with ophthalmologist

## 2020-05-25 NOTE — Patient Instructions (Signed)
Thank you for coming in to see [name] today! Please see below to review our plan for today's visit:  1. We are checking your kidney function and electrolytes. A1c today is 8% up from 7.5%. We are also checking Hep C today. 2. Keep taking the Metformin and taking Invokana/Canagliflozin every day.  3. Come back in 1-2 weeks for repeat BMP (kidney/electrolyte lab) 4. Follow up 3 months for repeat A1c check.   Please call the clinic at [phone] if your symptoms worsen or you have any concerns. It was our pleasure to serve you!   Dr. Milus Banister [facility]

## 2020-05-25 NOTE — Progress Notes (Signed)
    SUBJECTIVE:   CHIEF COMPLAINT / HPI:   Follow-up for diabetes: Patient states she is currently taking Metformin 1000 mg twice a day.  Reports that was the other thing she is taking for her diabetes, although is also listed as having Lantus 22 units.  Last HbA1c in January 2021 was 7.5%, today is slightly elevated at 8%.  Denies any polyuria, polydipsia, polyphagia.  Reports that she has an ophthalmologist whom she has not seen in a while, is overdue for eye exam.  Hepatitis C screening: Patient is also overdue for hepatitis C screening  PERTINENT  PMH / PSH:  Patient Active Problem List   Diagnosis Date Noted  . Right knee pain 05/29/2019  . Neuropathic pain of both legs 06/23/2018  . Dizziness 02/21/2018  . HTN (hypertension) 02/21/2018  . Routine general medical examination at a health care facility 01/13/2018  . Estrogen deficiency 01/13/2018  . Need for prophylactic vaccination against Streptococcus pneumoniae (pneumococcus) 01/13/2018  . Type 2 diabetes mellitus without complication, with long-term current use of insulin ([place]) 01/13/2018  . Vision changes 01/13/2018    OBJECTIVE:   BP (!) 140/56   Pulse 61   Ht 5\' 1"  (1.549 m)   Wt 130 lb 8 oz (59.2 kg)   SpO2 99%   BMI 24.66 kg/m   Physical exam: General: Well-appearing female, no apparent distress, very pleasant Respiratory: CTA bilaterally, comfortable work of breathing Cardio: RRR, S1-S2 present, no murmurs appreciated Abdomen: Soft, nontender, normal bowel sounds appreciated Extremities: No evidence of edema, cyanosis, or deformity on physical exam   ASSESSMENT/PLAN:   Type 2 diabetes mellitus without complication, with long-term current use of insulin (HCC) Repeat HbA1c today 8.0% up from previous measurement of 7.5%. -Continue Metformin 2000 mg daily (1000 mg twice daily) -Continue Lantus 22 units daily in the evening -BMP checked today -Started on Invokana, instructed to follow-up 1-2 weeks for repeat  BMP -Follow-up 3 months for repeat HbA1c -Instructed to follow-up with ophthalmologist    Routine hepatitis C screening: Hepatitis C antibodies tested at today's visit, we will follow-up with results   Daisy Floro, [place]

## 2020-05-26 LAB — BASIC METABOLIC PANEL
BUN/Creatinine Ratio: 23 (ref 12–28)
BUN: 18 mg/dL (ref 8–27)
CO2: 24 mmol/L (ref 20–29)
Calcium: 9.9 mg/dL (ref 8.7–10.3)
Chloride: 103 mmol/L (ref 96–106)
Creatinine, Ser: 0.8 mg/dL (ref 0.57–1.00)
GFR calc Af Amer: 81 mL/min/{1.73_m2} (ref >59)
GFR calc non Af Amer: 70 mL/min/{1.73_m2} (ref >59)
Glucose: 195 mg/dL — ABNORMAL HIGH (ref 65–99)
Potassium: 4.2 mmol/L (ref 3.5–5.2)
Sodium: 141 mmol/L (ref 134–144)

## 2020-05-26 LAB — HEPATITIS C ANTIBODY: Hep C Virus Ab: <0.1 s/co ratio (ref 0.0–0.9)

## 2020-06-20 ENCOUNTER — Other Ambulatory Visit: Payer: Self-pay

## 2020-06-20 DIAGNOSIS — I10 Essential (primary) hypertension: Secondary | ICD-10-CM

## 2020-06-20 MED ORDER — AMLODIPINE BESYLATE 10 MG PO TABS: 10.0000 mg | ORAL_TABLET | Freq: Every day | ORAL | 3 refills | Status: DC

## 2020-06-21 ENCOUNTER — Other Ambulatory Visit: Payer: Self-pay

## 2020-06-21 ENCOUNTER — Encounter: Payer: Self-pay | Admitting: Family Medicine

## 2020-06-21 ENCOUNTER — Ambulatory Visit (INDEPENDENT_AMBULATORY_CARE_PROVIDER_SITE_OTHER): Payer: Medicare HMO | Admitting: Family Medicine

## 2020-06-21 VITALS — BP 120/60 | HR 80 | Ht 61.0 in | Wt 130.1 lb

## 2020-06-21 DIAGNOSIS — N898 Other specified noninflammatory disorders of vagina: Secondary | ICD-10-CM | POA: Insufficient documentation

## 2020-06-21 DIAGNOSIS — J302 Other seasonal allergic rhinitis: Secondary | ICD-10-CM

## 2020-06-21 DIAGNOSIS — E119 Type 2 diabetes mellitus without complications: Secondary | ICD-10-CM

## 2020-06-21 DIAGNOSIS — Z794 Long term (current) use of insulin: Secondary | ICD-10-CM

## 2020-06-21 HISTORY — DX: Other seasonal allergic rhinitis: J30.2

## 2020-06-21 HISTORY — DX: Other specified noninflammatory disorders of vagina: N89.8

## 2020-06-21 NOTE — Progress Notes (Signed)
    SUBJECTIVE:   CHIEF COMPLAINT / HPI:   Follow up for Diabetes: patient now taking Invokana. Is back to recheck BMP now that she's been on Invokana for 2 weeks. She is having some vaginal itchiness, denies any discharge.  She would like to do a urine microalbumin test today.  Allergies: she gets runny nose, watery eyes, especially when her grass gets mowed. She has sneezing a lot too. No shortness of breath, chest pain, headaches, vision changes. No fevers, body aches.  She is fully vaccinated against COVID.  Health maintenance: Eye Exam (working to get this scheduled), urine microalbumin  PERTINENT  PMH / PSH:  Patient Active Problem List   Diagnosis Date Noted  . Vaginal itching 06/21/2020  . Seasonal allergies 06/21/2020  . Right knee pain 05/29/2019  . Neuropathic pain of both legs 06/23/2018  . Dizziness 02/21/2018  . HTN (hypertension) 02/21/2018  . Routine general medical examination at a health care facility 01/13/2018  . Estrogen deficiency 01/13/2018  . Need for prophylactic vaccination against Streptococcus pneumoniae (pneumococcus) 01/13/2018  . Type 2 diabetes mellitus without complication, with long-term current use of insulin ([place]) 01/13/2018  . Vision changes 01/13/2018    OBJECTIVE:   BP 120/60   Pulse 80   Ht 5\' 1"  (1.549 m)   Wt 130 lb 2 oz (59 kg)   SpO2 98%   BMI 24.59 kg/m    Physical exam: General: Pleasant patient, no apparent distress Respiratory: CTA bilaterally, comfortable work of breathing, speaking complete sentences Cardio: RRR, S1-S2 present, no murmurs Abdomen: Soft, nontender, normal bowel sounds appreciated   ASSESSMENT/PLAN:   Type 2 diabetes mellitus without complication, with long-term current use of insulin ([place]) -Patient started on Invokana at 7/28 appointment -Check BMP today -Check urine microalbumin, creatinine ratio  Vaginal itching Patient with vaginal itch in the setting of recently starting Invokana. -Patient  would like to try over-the-counter Monistat to control her symptoms -Patient structured to follow-up with [name] should her symptoms worsen or not resolve  Seasonal allergies Patient with seasonal allergies, reports occasional sneezing especially when grass gets caught.  Occasional cough, denies any productive cough, also denies chest pain, shortness of breath, fevers, body aches, chills.  Usually takes Zyrtec which improves her symptoms, however this also makes her very sleepy. -Patient would like to start Allegra over-the-counter for allergies -Follow-up at next appointment regarding efficacy of Allegra and any concern for sleepiness     [place]

## 2020-06-21 NOTE — Assessment & Plan Note (Signed)
-  Patient started on Invokana at 7/28 appointment -Check BMP today -Check urine microalbumin, creatinine ratio

## 2020-06-21 NOTE — Assessment & Plan Note (Signed)
Patient with seasonal allergies, reports occasional sneezing especially when grass gets caught.  Occasional cough, denies any productive cough, also denies chest pain, shortness of breath, fevers, body aches, chills.  Usually takes Zyrtec which improves her symptoms, however this also makes her very sleepy. -Patient would like to start Allegra over-the-counter for allergies -Follow-up at next appointment regarding efficacy of Allegra and any concern for sleepiness

## 2020-06-21 NOTE — Assessment & Plan Note (Signed)
Patient with vaginal itch in the setting of recently starting Invokana. -Patient would like to try over-the-counter Monistat to control her symptoms -Patient structured to follow-up with [name] should her symptoms worsen or not resolve

## 2020-06-21 NOTE — Patient Instructions (Addendum)
Thank you for coming in to see [name] today! Please see below to review our plan for today's visit:  1. Allegra over the counter 1 tablet for seasonal allergies. 2. Monistat ("miconazole") for yeast infection.  3. We are check electrolytes and kidney function today.   Please let us know if your symptoms change so that we can help with itchiness and seasonal allergies.   Please call the clinic at [phone] if your symptoms worsen or you have any concerns. It was our pleasure to serve you!    Dr. Milus Banister [facility]

## 2020-06-22 LAB — BASIC METABOLIC PANEL
BUN/Creatinine Ratio: 23 (ref 12–28)
BUN: 18 mg/dL (ref 8–27)
CO2: 23 mmol/L (ref 20–29)
Calcium: 9.9 mg/dL (ref 8.7–10.3)
Chloride: 105 mmol/L (ref 96–106)
Creatinine, Ser: 0.8 mg/dL (ref 0.57–1.00)
GFR calc Af Amer: 81 mL/min/{1.73_m2} (ref >59)
GFR calc non Af Amer: 70 mL/min/{1.73_m2} (ref >59)
Glucose: 121 mg/dL — ABNORMAL HIGH (ref 65–99)
Potassium: 4.4 mmol/L (ref 3.5–5.2)
Sodium: 142 mmol/L (ref 134–144)

## 2020-06-22 LAB — MICROALBUMIN / CREATININE URINE RATIO
Creatinine, Urine: 63.7 mg/dL
Microalb/Creat Ratio: 122 mg/g creat — ABNORMAL HIGH (ref 0–29)
Microalbumin, Urine: 77.8 ug/mL

## 2020-09-14 ENCOUNTER — Other Ambulatory Visit: Payer: Self-pay

## 2020-09-14 ENCOUNTER — Encounter: Payer: Self-pay | Admitting: Family Medicine

## 2020-09-14 ENCOUNTER — Ambulatory Visit (INDEPENDENT_AMBULATORY_CARE_PROVIDER_SITE_OTHER): Payer: Medicare HMO | Admitting: Family Medicine

## 2020-09-14 VITALS — BP 142/66 | HR 96 | Ht 61.0 in | Wt 129.0 lb

## 2020-09-14 DIAGNOSIS — N182 Chronic kidney disease, stage 2 (mild): Secondary | ICD-10-CM | POA: Diagnosis not present

## 2020-09-14 DIAGNOSIS — I7090 Unspecified atherosclerosis: Secondary | ICD-10-CM

## 2020-09-14 DIAGNOSIS — E1122 Type 2 diabetes mellitus with diabetic chronic kidney disease: Secondary | ICD-10-CM | POA: Diagnosis not present

## 2020-09-14 DIAGNOSIS — N1831 Chronic kidney disease, stage 3a: Secondary | ICD-10-CM | POA: Insufficient documentation

## 2020-09-14 DIAGNOSIS — I10 Essential (primary) hypertension: Secondary | ICD-10-CM | POA: Diagnosis not present

## 2020-09-14 DIAGNOSIS — Z794 Long term (current) use of insulin: Secondary | ICD-10-CM | POA: Diagnosis not present

## 2020-09-14 DIAGNOSIS — E119 Type 2 diabetes mellitus without complications: Secondary | ICD-10-CM | POA: Diagnosis not present

## 2020-09-14 DIAGNOSIS — Z23 Encounter for immunization: Secondary | ICD-10-CM

## 2020-09-14 LAB — POCT GLYCOSYLATED HEMOGLOBIN (HGB A1C): Hemoglobin A1C: 7.4 % — AB (ref 4.0–5.6)

## 2020-09-14 NOTE — Patient Instructions (Addendum)
Thank you for coming in to see [name] today! Please see below to review our plan for today's visit:  1. We are rechecking your Cholesterol levels. I might change your Blood pressure medication and add a medicine for high cholesterol, but I will call you when I decided to make these changes.  2. Congratulations on the lower HbA1c of 7.4%!!! 3. Please reach out to your eye doctor to have diabetic eye exam.   Please call the clinic at [phone] if your symptoms worsen or you have any concerns. It was our pleasure to serve you!   Dr. Milus Banister [facility]

## 2020-09-14 NOTE — Progress Notes (Signed)
SUBJECTIVE:   CHIEF COMPLAINT / HPI:   Diabetes: Last HbA1c 8% in July 2021. For her diabetes she is prescribed Lantus 22u daily, Metformin 1000mg  BID, and in July 2021 was started on Invokana/Canagliflozin. (Patient was last seen June 21, 2020 regarding yeast infection that had started after she had been on Invokana/Canagliflozon for about 1 month, was instructed to take Monistat OTC to help with this and follow up as needed.) The patient is due for a diabetic eye exam and foot exam. Her Microalbumin/Cr ratio was elevated at 122, indicating moderate kidney disease. However her GFR was 81 on BMP.   Elevated ASCVD risk: Due to her history of elevated blood pressure, age, and history of diabetes the patient's ASCVD risk is 48% and patient is not currently on a Statin. She is also not on an ACEi or ARB. She has no history of Statin or ACEi/ARB intolerance. Her last lipid panel was from March 2019 and was normal except for LDL at 109.  Seasonal allergies: patient was switched from Zyrtec to Allegra due to concerns that Zyrtec makes her sleepy.  The patient reports doing well with this medication.  HTN: Patient's BP today 142/66. She takes Amlodipine 10mg  daily. Due to her diabetes and elevated Microalbumin/Cr ratio patient would benefit from taking ARB for cardiovascular and renal benefits.   Health Maintenance: Patient received her flu shot today.  Patient would also benefit from [place], which she reports is scheduled for this Saturday, 09/17/2020.   PERTINENT  PMH / PSH:  Patient Active Problem List   Diagnosis Date Noted  . Atherosclerotic vascular disease 09/16/2020  . Need for immunization against influenza 09/16/2020  . CKD (chronic kidney disease) stage 2, GFR 60-89 ml/min 09/14/2020  . Neuropathic pain of both legs 06/23/2018  . HTN (hypertension) 02/21/2018  . Type 2 diabetes mellitus without complication, with long-term current use of insulin ([place]) 01/13/2018     OBJECTIVE:   BP (!) 142/66   Pulse 96   Ht 5\' 1"  (1.549 m)   Wt 129 lb (58.5 kg)   SpO2 98%   BMI 24.37 kg/m    Physical exam: General: Well-appearing patient, no apparent distress Respiratory: ED bilaterally, comfortable work of breathing Cardio: RRR, S1-S2 present, no murmurs appreciated [place]is: Somewhat decreased sensation to bilateral plantar surfaces, otherwise feet normal in appearance   ASSESSMENT/PLAN:   HTN (hypertension) Patient's blood pressure today 142/66.  Goal for this patient is blood pressure 130/90. -Due to patient's history of diabetes and no history of intolerance to ACE/ARB, will stop patient's amlodipine 10 mg and start her on losartan 25 mg. -Plan to follow-up in 1-2 weeks for repeat BMP and blood pressure check  Type 2 diabetes mellitus without complication, with long-term current use of insulin (HCC) HbA1c today within range is 7.4% (was previously 8%) -Continue Lantus 20 units daily -Continue Metformin 1000 mg twice daily -Continue [place] flows and 300 mg daily -Adding losartan 25 mg for renal protection  CKD (chronic kidney disease) stage 2, GFR 60-89 ml/min On BMP patient's GFR is 81, however patient has a history of elevated microalbumin-creatinine ratio indicating early stages of chronic kidney disease.  Patient does have a history of hypertension and diabetes which put her at high risk for chronic kidney disease. -Stopping amlodipine, starting losartan 25 mg daily with plans to recheck patient's BMP and blood pressure in 1-2 weeks -Continue Invokana 300 mg daily  Need for immunization against influenza -Patient received flu vaccination today  Atherosclerotic vascular disease Patient's ASCVD risk 48.5%.  Patient is not currently on a cholesterol-lowering medication.  Her last lipid panel showed LDL slightly elevated at 109, most recent lipid panel shows LDL 100.  While the patient is 79 years old and it is unusual to prescribe a statin to  a patient with her age, the patient is a young 79 year old with good quality of life which she would like to maintain.  Patient scenario was discussed with Dr. Valentina Lucks, as well as with the patient, who agreed to starting low-dose statin. -Starting Lipitor 10 mg    Daisy Floro, [place]

## 2020-09-15 LAB — LIPID PANEL
Chol/HDL Ratio: 2.9 ratio (ref 0.0–4.4)
Cholesterol, Total: 184 mg/dL (ref 100–199)
HDL: 63 mg/dL (ref >39)
LDL Chol Calc (NIH): 100 mg/dL — ABNORMAL HIGH (ref 0–99)
Triglycerides: 118 mg/dL (ref 0–149)
VLDL Cholesterol Cal: 21 mg/dL (ref 5–40)

## 2020-09-16 DIAGNOSIS — Z23 Encounter for immunization: Secondary | ICD-10-CM | POA: Insufficient documentation

## 2020-09-16 DIAGNOSIS — I7090 Unspecified atherosclerosis: Secondary | ICD-10-CM | POA: Insufficient documentation

## 2020-09-16 MED ORDER — LOSARTAN POTASSIUM 25 MG PO TABS: 25.0000 mg | ORAL_TABLET | Freq: Every day | ORAL | 0 refills | Status: DC

## 2020-09-16 MED ORDER — ATORVASTATIN CALCIUM 10 MG PO TABS: 10.0000 mg | ORAL_TABLET | Freq: Every day | ORAL | 0 refills | Status: DC

## 2020-09-16 NOTE — Assessment & Plan Note (Signed)
HbA1c today within range is 7.4% (was previously 8%) -Continue Lantus 20 units daily -Continue Metformin 1000 mg twice daily -Continue Can[place]al flows and 300 mg daily -Adding losartan 25 mg for renal protection

## 2020-09-16 NOTE — Assessment & Plan Note (Signed)
Patient received flu vaccination today 

## 2020-09-16 NOTE — Assessment & Plan Note (Signed)
Patient's ASCVD risk 48.5%.  Patient is not currently on a cholesterol-lowering medication.  Her last lipid panel showed LDL slightly elevated at 109, most recent lipid panel shows LDL 100.  While the patient is 79 years old and it is unusual to prescribe a statin to a patient with her age, the patient is a young 79 year old with good quality of life which she would like to maintain.  Patient scenario was discussed with Dr. Valentina Lucks, as well as with the patient, who agreed to starting low-dose statin. -Starting Lipitor 10 mg

## 2020-09-16 NOTE — Assessment & Plan Note (Signed)
On BMP patient's GFR is 81, however patient has a history of elevated microalbumin-creatinine ratio indicating early stages of chronic kidney disease.  Patient does have a history of hypertension and diabetes which put her at high risk for chronic kidney disease. -Stopping amlodipine, starting losartan 25 mg daily with plans to recheck patient's BMP and blood pressure in 1-2 weeks -Continue Invokana 300 mg daily

## 2020-09-16 NOTE — Assessment & Plan Note (Signed)
Patient's blood pressure today 142/66.  Goal for this patient is blood pressure 130/90. -Due to patient's history of diabetes and no history of intolerance to ACE/ARB, will stop patient's amlodipine 10 mg and start her on losartan 25 mg. -Plan to follow-up in 1-2 weeks for repeat BMP and blood pressure check

## 2020-12-14 NOTE — Progress Notes (Signed)
    SUBJECTIVE:   CHIEF COMPLAINT / HPI:   T2DM: patient following up for diabetes. Her last A1c 7.4% in November 2021, she is currently prescribed Metformin 1000mg  BID and Invokana 300 mg daily. Her A1c today is 6.8%.   HTN: BP today 112/60. She is currently prescribed Losartan 25mg  daily at bedtime.  Patient reports that a little while after taking losartan she develops a "tickle in her throat", which causes her to cough, then it goes away.  Denies any sputum production associated with it.  Denies any chest pain, shortness of breath.  Hyperlipidemia: Patient's last lipid panel in November 2021 showed LDL at 100, other parameters within normal limits.  Patient is prescribed atorvastatin 10 mg daily.  Agrees to increase dosing to 20 mg at today's visit. Patient's ASCVD risk is 34.2% and would benefit from moderate-high intensity statin.   PERTINENT  PMH / PSH:  Patient Active Problem List   Diagnosis Date Noted  . Atherosclerotic vascular disease 09/16/2020  . Need for immunization against influenza 09/16/2020  . CKD (chronic kidney disease) stage 2, GFR 60-89 ml/min 09/14/2020  . Neuropathic pain of both legs 06/23/2018  . HTN (hypertension) 02/21/2018  . Type 2 diabetes mellitus without complication, with long-term current use of insulin (HCC) 01/13/2018    OBJECTIVE:   BP 112/60   Pulse 99   Ht 5\' 1"  (1.549 m)   Wt 124 lb 6 oz (56.4 kg)   SpO2 99%   BMI 23.50 kg/m    Physical exam: General: Well-appearing patient, no apparent distress Respiratory: CTA bilaterally, comfortable work of breathing Cardio: RRR, S1-S2 present, no murmurs appreciated   ASSESSMENT/PLAN:   Atherosclerotic vascular disease Patient's ASCVD risk 34.2%.  LDL on last lipid panel November 2021 is 100, all other parameters within normal limits. Due to elevated ASCVD risk patient would benefit from moderate-high intensity statin -Patient's Lipitor increased from 10 to 20 mg daily.   HTN  (hypertension) Blood pressure today 112/60.  Patient is prescribed losartan 25 mg every evening.  Patient thought that losartan was causing her to cough at night, however the sounds that it is very short-lived and believe it is the coincidental finding. -Continue losartan 25 mg every evening -Patient can take Claritin 10 mg in the evening with her losartan  Type 2 diabetes mellitus without complication, with long-term current use of insulin (HCC) HbA1c today 6.8%, improved from previous value of 7.4%.  Patient currently prescribed Invokana 300 mg daily and Metformin 1000 mg twice daily with meals. -Continue Metformin 1000 mg twice daily and Invokana 300 mg daily   Follow-up 3-6 months  Dollene Cleveland, DO [facility]

## 2020-12-15 ENCOUNTER — Other Ambulatory Visit: Payer: Self-pay

## 2020-12-15 ENCOUNTER — Encounter: Payer: Self-pay | Admitting: Family Medicine

## 2020-12-15 ENCOUNTER — Ambulatory Visit (INDEPENDENT_AMBULATORY_CARE_PROVIDER_SITE_OTHER): Payer: Medicare HMO | Admitting: Family Medicine

## 2020-12-15 VITALS — BP 112/60 | HR 99 | Ht 61.0 in | Wt 124.4 lb

## 2020-12-15 DIAGNOSIS — E119 Type 2 diabetes mellitus without complications: Secondary | ICD-10-CM

## 2020-12-15 DIAGNOSIS — E1100 Type 2 diabetes mellitus with hyperosmolarity without nonketotic hyperglycemic-hyperosmolar coma (NKHHC): Secondary | ICD-10-CM

## 2020-12-15 DIAGNOSIS — G5793 Unspecified mononeuropathy of bilateral lower limbs: Secondary | ICD-10-CM | POA: Diagnosis not present

## 2020-12-15 DIAGNOSIS — I10 Essential (primary) hypertension: Secondary | ICD-10-CM

## 2020-12-15 DIAGNOSIS — Z794 Long term (current) use of insulin: Secondary | ICD-10-CM

## 2020-12-15 DIAGNOSIS — N182 Chronic kidney disease, stage 2 (mild): Secondary | ICD-10-CM

## 2020-12-15 DIAGNOSIS — I7090 Unspecified atherosclerosis: Secondary | ICD-10-CM

## 2020-12-15 LAB — POCT GLYCOSYLATED HEMOGLOBIN (HGB A1C): Hemoglobin A1C: 6.8 % — AB (ref 4.0–5.6)

## 2020-12-15 MED ORDER — GABAPENTIN 100 MG PO CAPS: 100.0000 mg | ORAL_CAPSULE | Freq: Every day | ORAL | 3 refills | Status: DC

## 2020-12-15 MED ORDER — ATORVASTATIN CALCIUM 20 MG PO TABS: 20.0000 mg | ORAL_TABLET | Freq: Every day | ORAL | 11 refills | Status: DC

## 2020-12-15 MED ORDER — CANAGLIFLOZIN 300 MG PO TABS: 300.0000 mg | ORAL_TABLET | Freq: Every day | ORAL | 11 refills | Status: DC

## 2020-12-15 MED ORDER — METFORMIN HCL 500 MG PO TABS: 1000.0000 mg | ORAL_TABLET | Freq: Two times a day (BID) | ORAL | 6 refills | Status: DC

## 2020-12-15 MED ORDER — LOSARTAN POTASSIUM 25 MG PO TABS: 25.0000 mg | ORAL_TABLET | Freq: Every day | ORAL | 3 refills | Status: DC

## 2020-12-15 NOTE — Patient Instructions (Addendum)
Thank you for coming in to see [name] today! Please see below to review our plan for today's visit:  1. Take Claritin with your Losartan (Blood pressure medicine) to help reduce any cough. Take both of these meds at night time. 2. Continue Metformin 2 tablets two times daily for your diabetes. Also continue Canagliflozin/Invokana 300mg  once daily.  3. We have increased your Lipitor/Atorvastatin from 10mg  daily up to 20mg  daily. You can take this medicine at night to lower cholesterol.   Please call the clinic at [phone] if your symptoms worsen or you have any concerns. It was our pleasure to serve you!   Dr. Milus Banister [facility]

## 2020-12-15 NOTE — Assessment & Plan Note (Signed)
Blood pressure today 112/60.  Patient is prescribed losartan 25 mg every evening.  Patient thought that losartan was causing her to cough at night, however the sounds that it is very short-lived and believe it is the coincidental finding. -Continue losartan 25 mg every evening -Patient can take Claritin 10 mg in the evening with her losartan

## 2020-12-15 NOTE — Assessment & Plan Note (Signed)
HbA1c today 6.8%, improved from previous value of 7.4%.  Patient currently prescribed Invokana 300 mg daily and Metformin 1000 mg twice daily with meals. -Continue Metformin 1000 mg twice daily and Invokana 300 mg daily

## 2020-12-15 NOTE — Assessment & Plan Note (Signed)
Patient's ASCVD risk 34.2%.  LDL on last lipid panel November 2021 is 100, all other parameters within normal limits. Due to elevated ASCVD risk patient would benefit from moderate-high intensity statin -Patient's Lipitor increased from 10 to 20 mg daily.

## 2021-03-08 ENCOUNTER — Other Ambulatory Visit: Payer: Self-pay | Admitting: Family Medicine

## 2021-03-08 DIAGNOSIS — I7090 Unspecified atherosclerosis: Secondary | ICD-10-CM

## 2021-03-30 ENCOUNTER — Ambulatory Visit (INDEPENDENT_AMBULATORY_CARE_PROVIDER_SITE_OTHER): Payer: Medicare HMO | Admitting: Family Medicine

## 2021-03-30 DIAGNOSIS — Z794 Long term (current) use of insulin: Secondary | ICD-10-CM

## 2021-03-30 DIAGNOSIS — Z5329 Procedure and treatment not carried out because of patient's decision for other reasons: Secondary | ICD-10-CM

## 2021-03-30 DIAGNOSIS — I10 Essential (primary) hypertension: Secondary | ICD-10-CM

## 2021-03-30 DIAGNOSIS — I7090 Unspecified atherosclerosis: Secondary | ICD-10-CM

## 2021-03-30 DIAGNOSIS — Z91199 Patient's noncompliance with other medical treatment and regimen due to unspecified reason: Secondary | ICD-10-CM | POA: Insufficient documentation

## 2021-03-30 DIAGNOSIS — E119 Type 2 diabetes mellitus without complications: Secondary | ICD-10-CM

## 2021-03-30 NOTE — Progress Notes (Signed)
Patient scheduled 6/2 for follow up of T2DM and HTN. Rescheduled.   Milus Banister, [place], PGY-3 03/30/2021 11:42 AM

## 2021-04-05 NOTE — Progress Notes (Signed)
SUBJECTIVE:   CHIEF COMPLAINT / HPI:   T2DM: patient following up for diabetes. Her last A1c 6.8% in Feb 2022, she is currently prescribed Metformin 1000mg  BID, Invokana 300 mg daily and Insulin 22u daily. Per pharm team consider adding Exenatide/GLP1 agonist due to history of CKD. A1c today is 7.5% (goal 7-8%).    HTN: BP today 149/68. She is currently prescribed Losartan 25mg  daily at bedtime, says she took it last night but was rushing this morning and feels its why it might be high.  Patient denies headaches, vision changes, chest pain, shortness of breath.    Hyperlipidemia/atherosclerotic disease: Patient's last lipid panel in November 2021 showed LDL at 100, other parameters within normal limits.  Patient's Lipitor increased from 10 up to 20mg  daily at her Feb 2022 appt. Patient's ASCVD risk is 35% and would benefit from moderate-high intensity statin, goal LDL less than 70.   Right leg pain: started in Feb 2022, happens when she lays down at night time and also sometimes occurs during the day.  Patient states pain stays in her calf, right knee and sometimes radiates into her right hip. It is somewhat sharp in nature with a little ache, as if "it grabs you". In a given week it bothers her 3-4 times weekly. Pain lasts for 20-25 minutes. Bothers her during the day and night but bothers her more at night.  Patient denies any traumas or falls.  Differential remains broad with arthritis, peripheral artery disease, restless leg syndrome, sciatica.  PERTINENT  PMH / PSH:  HTN, T2DM, HLD  OBJECTIVE:   BP (!) 149/68   Pulse 78   Ht 5\' 1"  (1.549 m)   Wt 127 lb 3.2 oz (57.7 kg)   SpO2 99%   BMI 24.03 kg/m    Physical exam: General: Well-appearing patient, no apparent distress Respiratory: Comfortable work of breathing on room air, speaking complete sentences  ASSESSMENT/PLAN:   Atherosclerotic vascular disease Patient with very elevated ASCVD risk and atherosclerotic vascular  disease.  Goal for patient's LDL is less than 70, most recent LDL was 100 (November 2021). -Increase Lipitor from 20 up to 40 mg today.  Patient reports she was recently having some leg pain, question if this is due to peripheral artery disease, arthritis, or potentially due to medication side effect from Lipitor. -Plan to check lipid panel at next appointment (September 2022).  HTN (hypertension) Blood pressure today elevated at 149/68, patient asymptomatic.  Blood pressure most likely elevated due to patient rushing today.  Meant to have blood pressure rechecked however this was unable to be done as the patient had already left. -Plan to continue patient on losartan 25 mg -Recheck at next appointment, if elevated plan to repeat.  If repeat blood pressure remains elevated consider increasing losartan up to 50 mg daily  Type 2 diabetes mellitus without complication, with long-term current use of insulin (HCC) A1c today at goal 7.3%. -Continue Invokana 300 mg daily -Continue metformin 1000 mg twice daily -Continue Lantus 22 units daily  Right leg pain Patient with right-sided leg pain since February 2022.  Reports pain occurs 3-4 times per week and each episode last 20-30 minutes.  Episodes are more bothersome to her at night, however pains can also occur during the day with activity, patient rests but still takes the pain 20-30 minutes to improve.  Differential remains broad with arthritis, sciatica, peripheral artery disease, and/or medication side effect of Lipitor (dose of Lipitor was increased from 10 up to  20 mg daily at her February 2022 appointment). -Lipitor was increased again from 20 up to 40 mg at this visit June 2022.  Patient instructed that if her leg pain gets worse to cut back on Lipitor down to 20 mg daily. -If patient continues to have pain consider doing ABIs at next appointment -Patient given Voltaren gel to be applied 4 times daily to her leg -Strict return precautions  provided     Daisy Floro, [place]

## 2021-04-06 ENCOUNTER — Ambulatory Visit (INDEPENDENT_AMBULATORY_CARE_PROVIDER_SITE_OTHER): Payer: Medicare HMO | Admitting: Family Medicine

## 2021-04-06 ENCOUNTER — Other Ambulatory Visit: Payer: Self-pay

## 2021-04-06 ENCOUNTER — Ambulatory Visit (INDEPENDENT_AMBULATORY_CARE_PROVIDER_SITE_OTHER): Payer: Medicare HMO

## 2021-04-06 ENCOUNTER — Encounter: Payer: Self-pay | Admitting: Family Medicine

## 2021-04-06 VITALS — BP 149/68 | HR 78 | Ht 61.0 in | Wt 127.2 lb

## 2021-04-06 DIAGNOSIS — M79604 Pain in right leg: Secondary | ICD-10-CM

## 2021-04-06 DIAGNOSIS — Z794 Long term (current) use of insulin: Secondary | ICD-10-CM

## 2021-04-06 DIAGNOSIS — E119 Type 2 diabetes mellitus without complications: Secondary | ICD-10-CM | POA: Diagnosis not present

## 2021-04-06 DIAGNOSIS — I7090 Unspecified atherosclerosis: Secondary | ICD-10-CM | POA: Diagnosis not present

## 2021-04-06 DIAGNOSIS — I10 Essential (primary) hypertension: Secondary | ICD-10-CM

## 2021-04-06 DIAGNOSIS — N182 Chronic kidney disease, stage 2 (mild): Secondary | ICD-10-CM

## 2021-04-06 DIAGNOSIS — Z23 Encounter for immunization: Secondary | ICD-10-CM | POA: Diagnosis not present

## 2021-04-06 LAB — POCT GLYCOSYLATED HEMOGLOBIN (HGB A1C): HbA1c, POC (controlled diabetic range): 7.3 % — AB (ref 0.0–7.0)

## 2021-04-06 MED ORDER — ATORVASTATIN CALCIUM 20 MG PO TABS: 20.0000 mg | ORAL_TABLET | Freq: Every day | ORAL | 11 refills | Status: DC

## 2021-04-06 MED ORDER — ATORVASTATIN CALCIUM 20 MG PO TABS: 40.0000 mg | ORAL_TABLET | Freq: Every day | ORAL | 1 refills | Status: DC

## 2021-04-06 MED ORDER — LOSARTAN POTASSIUM 25 MG PO TABS: 25.0000 mg | ORAL_TABLET | Freq: Every day | ORAL | 3 refills | Status: DC

## 2021-04-06 MED ORDER — "PEN NEEDLES 3/16"" 31G X 5 MM MISC": 3 refills | Status: DC

## 2021-04-06 MED ORDER — GLUCOSE BLOOD VI STRP
1.0000 | ORAL_STRIP | Freq: Three times a day (TID) | 12 refills | Status: DC
Start: 2021-04-06 — End: 2021-06-28

## 2021-04-06 MED ORDER — LANTUS SOLOSTAR 100 UNIT/ML ~~LOC~~ SOPN: 22.0000 [IU] | PEN_INJECTOR | Freq: Every day | SUBCUTANEOUS | 1 refills | Status: DC

## 2021-04-06 MED ORDER — CANAGLIFLOZIN 300 MG PO TABS: 300.0000 mg | ORAL_TABLET | Freq: Every day | ORAL | 11 refills | Status: DC

## 2021-04-06 MED ORDER — DICLOFENAC SODIUM 1 % EX GEL: 2.0000 g | Freq: Four times a day (QID) | CUTANEOUS | 1 refills | Status: DC

## 2021-04-06 NOTE — Assessment & Plan Note (Signed)
A1c today at goal 7.3%. -Continue Invokana 300 mg daily -Continue metformin 1000 mg twice daily -Continue Lantus 22 units daily

## 2021-04-06 NOTE — Assessment & Plan Note (Signed)
Patient with right-sided leg pain since February 2022.  Reports pain occurs 3-4 times per week and each episode last 20-30 minutes.  Episodes are more bothersome to her at night, however pains can also occur during the day with activity, patient rests but still takes the pain 20-30 minutes to improve.  Differential remains broad with arthritis, sciatica, peripheral artery disease, and/or medication side effect of Lipitor (dose of Lipitor was increased from 10 up to 20 mg daily at her February 2022 appointment). -Lipitor was increased again from 20 up to 40 mg at this visit June 2022.  Patient instructed that if her leg pain gets worse to cut back on Lipitor down to 20 mg daily. -If patient continues to have pain consider doing ABIs at next appointment -Patient given Voltaren gel to be applied 4 times daily to her leg -Strict return precautions provided

## 2021-04-06 NOTE — Assessment & Plan Note (Signed)
Patient with very elevated ASCVD risk and atherosclerotic vascular disease.  Goal for patient's LDL is less than 70, most recent LDL was 100 (November 2021). -Increase Lipitor from 20 up to 40 mg today.  Patient reports she was recently having some leg pain, question if this is due to peripheral artery disease, arthritis, or potentially due to medication side effect from Lipitor. -Plan to check lipid panel at next appointment (September 2022).

## 2021-04-06 NOTE — Assessment & Plan Note (Signed)
Blood pressure today elevated at 149/68, patient asymptomatic.  Blood pressure most likely elevated due to patient rushing today.  Meant to have blood pressure rechecked however this was unable to be done as the patient had already left. -Plan to continue patient on losartan 25 mg -Recheck at next appointment, if elevated plan to repeat.  If repeat blood pressure remains elevated consider increasing losartan up to 50 mg daily

## 2021-04-06 NOTE — Patient Instructions (Signed)
Thank you for coming in to see Sara [name] today! Please see below to review our plan for today's visit:  1. Continue to take your Metformin, Invokana and your Lantus.  2. Continue your Lipitor, but instead take 1 tablets at once (for a total dose of 40mg ). If your leg pain worsens cut back to only 1 tablet daily at bedtime.  3. You can apply Voltaren gel to your right left up to 4 times daily.   Please call the clinic at [phone] if your symptoms worsen or you have any concerns. It was our pleasure to serve you!   Dr. Milus Banister [facility]

## 2021-04-10 ENCOUNTER — Other Ambulatory Visit: Payer: Self-pay

## 2021-04-17 ENCOUNTER — Other Ambulatory Visit: Payer: Self-pay

## 2021-04-17 MED ORDER — ATORVASTATIN CALCIUM 40 MG PO TABS: 40.0000 mg | ORAL_TABLET | Freq: Every day | ORAL | 3 refills | Status: DC

## 2021-04-17 NOTE — Telephone Encounter (Signed)
Received phone call from [facility]. Per insurance and quantity limitations, insurance prefers one 40 mg tablet daily. I have pended medication to this encounter.   Please advise if medication can be sent to pharmacy as 40 mg tablet daily.   Talbot Grumbling, RN

## 2021-05-17 ENCOUNTER — Emergency Department (HOSPITAL_COMMUNITY)
Admission: EM | Admit: 2021-05-17 | Discharge: 2021-05-17 | Disposition: A | Discharge status: Home / Self Care | Payer: Medicare HMO | Attending: Emergency Medicine | Admitting: Emergency Medicine

## 2021-05-17 ENCOUNTER — Emergency Department (HOSPITAL_COMMUNITY): Payer: Medicare HMO

## 2021-05-17 ENCOUNTER — Encounter (HOSPITAL_COMMUNITY): Payer: Self-pay | Admitting: Emergency Medicine

## 2021-05-17 DIAGNOSIS — Z7984 Long term (current) use of oral hypoglycemic drugs: Secondary | ICD-10-CM | POA: Insufficient documentation

## 2021-05-17 DIAGNOSIS — M25561 Pain in right knee: Secondary | ICD-10-CM | POA: Diagnosis not present

## 2021-05-17 DIAGNOSIS — S8001XA Contusion of right knee, initial encounter: Secondary | ICD-10-CM | POA: Insufficient documentation

## 2021-05-17 DIAGNOSIS — S199XXA Unspecified injury of neck, initial encounter: Secondary | ICD-10-CM | POA: Diagnosis present

## 2021-05-17 DIAGNOSIS — Z79899 Other long term (current) drug therapy: Secondary | ICD-10-CM | POA: Insufficient documentation

## 2021-05-17 DIAGNOSIS — E1122 Type 2 diabetes mellitus with diabetic chronic kidney disease: Secondary | ICD-10-CM | POA: Diagnosis not present

## 2021-05-17 DIAGNOSIS — Y92410 Unspecified street and highway as the place of occurrence of the external cause: Secondary | ICD-10-CM | POA: Diagnosis not present

## 2021-05-17 DIAGNOSIS — M25511 Pain in right shoulder: Secondary | ICD-10-CM | POA: Insufficient documentation

## 2021-05-17 DIAGNOSIS — S161XXA Strain of muscle, fascia and tendon at neck level, initial encounter: Secondary | ICD-10-CM | POA: Insufficient documentation

## 2021-05-17 DIAGNOSIS — I129 Hypertensive chronic kidney disease with stage 1 through stage 4 chronic kidney disease, or unspecified chronic kidney disease: Secondary | ICD-10-CM | POA: Insufficient documentation

## 2021-05-17 DIAGNOSIS — Z794 Long term (current) use of insulin: Secondary | ICD-10-CM | POA: Diagnosis not present

## 2021-05-17 DIAGNOSIS — N182 Chronic kidney disease, stage 2 (mild): Secondary | ICD-10-CM | POA: Diagnosis not present

## 2021-05-17 DIAGNOSIS — M79651 Pain in right thigh: Secondary | ICD-10-CM | POA: Diagnosis not present
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
## 2021-05-17 NOTE — Discharge Instructions (Addendum)
1.  You may be more sore and stiff in 2 to 5 days.  Take extra strength Tylenol every 6 hours for pain as needed.  You may also use well wrapped ice packs on sore or bruised areas.  You may also use over-the-counter pain patches such as Salonpas if you have a lot of pain or stiffness in your back. 2.  Return to the emergency department if you get any weakness numbness or tingling to your extremities.  Develop a bad headache or confusion, shortness of breath, significant abdominal pain or other concerning symptoms. 3.  Schedule recheck with your doctor in 3 to 5 days.
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
## 2021-05-31 ENCOUNTER — Telehealth: Payer: Self-pay

## 2021-05-31 NOTE — Telephone Encounter (Signed)
Pt does have an appt on 06/13/21 with PCP - may be able to schedule pt at same time if scheduling allows.    LVM to have pt call back to schedule AWV.   RE: confirm insurance and schedule AWV on my schedule if times are convenient for patient or other AWV schedule as template permits.

## 2021-06-13 ENCOUNTER — Ambulatory Visit: Payer: Medicare HMO | Admitting: Family Medicine

## 2021-06-28 ENCOUNTER — Ambulatory Visit (INDEPENDENT_AMBULATORY_CARE_PROVIDER_SITE_OTHER): Payer: Medicare HMO | Admitting: Family Medicine

## 2021-06-28 ENCOUNTER — Encounter: Payer: Self-pay | Admitting: Family Medicine

## 2021-06-28 ENCOUNTER — Other Ambulatory Visit: Payer: Self-pay

## 2021-06-28 DIAGNOSIS — Z794 Long term (current) use of insulin: Secondary | ICD-10-CM | POA: Diagnosis not present

## 2021-06-28 DIAGNOSIS — M79604 Pain in right leg: Secondary | ICD-10-CM | POA: Diagnosis not present

## 2021-06-28 DIAGNOSIS — E119 Type 2 diabetes mellitus without complications: Secondary | ICD-10-CM

## 2021-06-28 DIAGNOSIS — I10 Essential (primary) hypertension: Secondary | ICD-10-CM | POA: Diagnosis not present

## 2021-06-28 DIAGNOSIS — E1100 Type 2 diabetes mellitus with hyperosmolarity without nonketotic hyperglycemic-hyperosmolar coma (NKHHC): Secondary | ICD-10-CM

## 2021-06-28 DIAGNOSIS — H9311 Tinnitus, right ear: Secondary | ICD-10-CM | POA: Diagnosis not present

## 2021-06-28 MED ORDER — METFORMIN HCL 500 MG PO TABS: 1000.0000 mg | ORAL_TABLET | Freq: Two times a day (BID) | ORAL | 6 refills | Status: DC

## 2021-06-28 MED ORDER — GLUCOSE BLOOD VI STRP
1.0000 | ORAL_STRIP | Freq: Three times a day (TID) | 12 refills | Status: DC
Start: 2021-06-28 — End: 2021-10-05

## 2021-06-28 NOTE — Progress Notes (Signed)
   SUBJECTIVE:   CHIEF COMPLAINT / HPI:   Chief Complaint  Patient presents with   Medication Refill   Knee Pain   Hip Pain     Sara [name] is a 80 y.o. female here for to discuss right knee and hip pain.    Knee and Hip Pain Patient reports constant right knee pain that radiates to her right hip.  She has been taking Tylenol 1 tablet twice a day but this is no longer helping.  Pain in her knee is chronic.  Hypertension BP at home this morning 149/77.  She took her medication prior to arrival.  Type 2 diabetes Reports home blood sugars between 140 and 150.  Denies symptoms of hypo and hyperglycemia.  Takes metformin twice a day.  Denies missing any doses of her medication.  Vies to watch what she eats.  Ear Problem Patient with "humming" in her right ear for the past week.  Sound is always there.  Denies fever, congestion, headache.  PERTINENT  PMH / PSH: reviewed and updated as appropriate   OBJECTIVE:   BP (!) 161/73   Pulse 78   Ht '5\' 1"'$  (1.549 m)   Wt 129 lb 12.8 oz (58.9 kg)   SpO2 100%   BMI 24.53 kg/m    GEN:     alert, well-appearing, appears younger than stated age and no distress    HENT:  mucus membranes moist,  nares patent, no nasal discharge, right TM cerumen impaction, normal left TM NECK:  supple, normal ROM, no lymphadenopathy  RESP:  clear to auscultation bilaterally, no increased work of breathing  CVS:   regular rate and rhythm, no murmur, distal pulses intact   EXT:   no LE edema, limited ROM of hip and knee 2/2 to pain, neurovascularly intact,   NEURO:  speech normal, alert and oriented Skin:   warm and dry    ASSESSMENT/PLAN:   HTN (hypertension) BP not at goal 161/73.  Suspect that this is likely due to her BP being checked right after sitting down.  Unfortunately, BP was not rechecked prior to leaving.  After calling patient, she did report taking her medication this morning.  This follow-up scheduled in 1 week.  Continue 25 mg  losartan.  Titrate up as needed.  Type 2 diabetes mellitus without complication, with long-term current use of insulin ([facility]) Stable.  A1c at goal.  Continue metformin 100 mg BID.   Right leg pain Uncontrolled.  Chronic.  Discussed increasing Tylenol as this was previously working for her.  Advised to take 2 tablets two to three times daily.  Rub voltaren gel (pain relieving gel) on knee and hip as needed.  Creatinine within normal limits.  Take ibuprofen every now and then if pain is not controlled. ?Arthritis with hip compensation. -Consider ABIs at next appointment to assess for peripheral artery disease  Tinnitus Non-pulsatile tinnitus.  DDx: age related hearing loss, cerumen impaction, uncontrolled BP, atherosclerotic disease, intracranial mass, AVM. Some improvement after irrigation but did not completely resolve.  Follow up scheduled for 1 week for reassessment.       Lyndee Hensen, DO PGY-3, [facility] 06/28/2021

## 2021-06-28 NOTE — Patient Instructions (Addendum)
Thank you for coming into the office today.   As discussed for your right hip and knee pain, you can take 2 tablets of Tylenol two to three times daily.  Rub voltaren gel (pain relieving gel) on them as needed.  You can take ibuprofen every now and then if your pain is not controlled.   You can use Debrox (pick up from the pharmacy) or hydrogen peroxide in your ear for the ear wax.      Stop by the pharmacy to pick up your prescriptions.   Continue taking your medications daily!   Take Care,   Dr. Susa Simmonds

## 2021-06-30 DIAGNOSIS — H9319 Tinnitus, unspecified ear: Secondary | ICD-10-CM | POA: Insufficient documentation

## 2021-06-30 NOTE — Assessment & Plan Note (Signed)
Stable.  A1c at goal.  Continue metformin 100 mg BID.

## 2021-06-30 NOTE — Assessment & Plan Note (Signed)
Non-pulsatile tinnitus.  DDx: age related hearing loss, cerumen impaction, uncontrolled BP, atherosclerotic disease, intracranial mass, AVM. Some improvement after irrigation but did not completely resolve.  Follow up scheduled for 1 week for reassessment.

## 2021-06-30 NOTE — Assessment & Plan Note (Signed)
BP not at goal 161/73.  Suspect that this is likely due to her BP being checked right after sitting down.  Unfortunately, BP was not rechecked prior to leaving.  After calling patient, she did report taking her medication this morning.  This follow-up scheduled in 1 week.  Continue 25 mg losartan.  Titrate up as needed.

## 2021-06-30 NOTE — Assessment & Plan Note (Signed)
Uncontrolled.  Chronic.  Discussed increasing Tylenol as this was previously working for her.  Advised to take 2 tablets two to three times daily.  Rub voltaren gel (pain relieving gel) on knee and hip as needed.  Creatinine within normal limits.  Take ibuprofen every now and then if pain is not controlled. ?Arthritis with hip compensation. -Consider ABIs at next appointment to assess for peripheral artery disease

## 2021-07-04 NOTE — Telephone Encounter (Signed)
Pt scheduled AWV

## 2021-07-05 ENCOUNTER — Other Ambulatory Visit: Payer: Self-pay

## 2021-07-05 ENCOUNTER — Ambulatory Visit (INDEPENDENT_AMBULATORY_CARE_PROVIDER_SITE_OTHER): Payer: Medicare HMO

## 2021-07-05 ENCOUNTER — Ambulatory Visit (INDEPENDENT_AMBULATORY_CARE_PROVIDER_SITE_OTHER): Payer: Medicare HMO | Admitting: Family Medicine

## 2021-07-05 VITALS — BP 157/71 | HR 84 | Ht 61.0 in | Wt 128.8 lb

## 2021-07-05 VITALS — BP 148/52

## 2021-07-05 DIAGNOSIS — Z23 Encounter for immunization: Secondary | ICD-10-CM

## 2021-07-05 DIAGNOSIS — H9311 Tinnitus, right ear: Secondary | ICD-10-CM | POA: Diagnosis not present

## 2021-07-05 DIAGNOSIS — I10 Essential (primary) hypertension: Secondary | ICD-10-CM | POA: Diagnosis not present

## 2021-07-05 DIAGNOSIS — N182 Chronic kidney disease, stage 2 (mild): Secondary | ICD-10-CM

## 2021-07-05 DIAGNOSIS — Z0000 Encounter for general adult medical examination without abnormal findings: Secondary | ICD-10-CM | POA: Diagnosis not present

## 2021-07-05 DIAGNOSIS — Z794 Long term (current) use of insulin: Secondary | ICD-10-CM | POA: Diagnosis not present

## 2021-07-05 DIAGNOSIS — E119 Type 2 diabetes mellitus without complications: Secondary | ICD-10-CM | POA: Diagnosis not present

## 2021-07-05 MED ORDER — LOSARTAN POTASSIUM 50 MG PO TABS: 50.0000 mg | ORAL_TABLET | Freq: Every day | ORAL | 1 refills | Status: DC

## 2021-07-05 NOTE — Progress Notes (Signed)
I have reviewed this visit and agree with the documentation.   

## 2021-07-05 NOTE — Progress Notes (Signed)
   SUBJECTIVE:   CHIEF COMPLAINT / HPI:   Chief Complaint  Patient presents with   Ear Problem     Sara [name] is a 80 y.o. female here for follow up for her BP and ear humming.    HTN Takes low dose Losartan. Denies missing doses of antihypertensive medications. Denies chest pain, palpitations, lower extremity edema, exertional dyspnea, lightheadedness, headaches and vision changes. BP 148/54 this morning prior to arrival. Took medication this morning.  Home BP Monitoring: Yes    Ear Concern Pt continues to have right ear humming. Humming is constant. She does not get dizzy with movement but notes room spinning when she lays on her right side in the bed. No ear pain, ear discharge, fever. She did not pick up debrox to get ear wax removed.   PERTINENT  PMH / PSH: reviewed and updated as appropriate   OBJECTIVE:   BP (!) 157/71   Pulse 84   Ht '5\' 1"'$  (1.549 m)   Wt 128 lb 12.8 oz (58.4 kg)   SpO2 100%   BMI 24.34 kg/m    GEN: pleasant well appearing female, does not appear stated age, in no acute distress HENT: cerumen impaction on the right,   CV: regular rate and rhythm, no murmurs appreciated, no carotid bruit  RESP: no increased work of breathing, clear to ascultation bilaterally MSK: no edema, thin extremities  SKIN: warm, dry  ASSESSMENT/PLAN:   Tinnitus Non-pulsatile tinnitus. Pt is a 80 yo female with constant humming in her right ear for the past 2 weeks with occassional. Pt continues to have right cerumen impaction. No improvement after irrigation. Suspect this is the cause of her sx. Can not rule out age related hearing loss, atherosclerotic disease, intracranial mass or AVM. Defer head and neck imaging until cerumen impaction can be addressed. Referral to ENT placed. Hopefully she will get her hearing assessed there as well.   HTN (hypertension) Pt's BP not at goal at home or in office. Increase Losartan to 50 mg daily. Recheck BMP in 1 month at follow  up.      Lyndee Hensen, DO PGY-3, [facility] 07/05/2021

## 2021-07-05 NOTE — Patient Instructions (Signed)
You spoke to Sara [name], [place] over the phone for your annual wellness visit.  We discussed goals:   Goals      Blood Pressure < 140/90        We also discussed recommended health maintenance. As discussed, you are due for:  Health Maintenance  Topic Date Due   OPHTHALMOLOGY EXAM  Never done   Zoster Vaccines- Shingrix (1 of 2) Never done   INFLUENZA VACCINE  05/29/2021   FOOT EXAM  09/14/2021   HEMOGLOBIN A1C  10/06/2021   TETANUS/TDAP  03/25/2028   DEXA SCAN  Completed   COVID-19 Vaccine  Completed   PNA vac Low Risk Adult  Completed   HPV VACCINES  Aged Out   Continue to monitor your blood pressure at home.  Your goal is 140/90. I will put in a refill for Invokana. Flu shots are available at [facility]!  Health Maintenance, Female Adopting a healthy lifestyle and getting preventive care are important in promoting health and wellness. Ask your health care provider about: The right schedule for you to have regular tests and exams. Things you can do on your own to prevent diseases and keep yourself healthy. What should I know about diet, weight, and exercise? Eat a healthy diet  Eat a diet that includes plenty of vegetables, fruits, low-fat dairy products, and lean protein. Do not eat a lot of foods that are high in solid fats, added sugars, or sodium. Maintain a healthy weight Body mass index (BMI) is used to identify weight problems. It estimates body fat based on height and weight. Your health care provider can help determine your BMI and help you achieve or maintain a healthy weight. Get regular exercise Get regular exercise. This is one of the most important things you can do for your health. Most adults should: Exercise for at least 150 minutes each week. The exercise should increase your heart rate and make you sweat (moderate-intensity exercise). Do strengthening exercises at least twice a week. This is in addition to the moderate-intensity exercise. Spend less time  sitting. Even light physical activity can be beneficial. Watch cholesterol and blood lipids Have your blood tested for lipids and cholesterol at 80 years of age, then have this test every 5 years. Have your cholesterol levels checked more often if: Your lipid or cholesterol levels are high. You are older than 80 years of age. You are at high risk for heart disease. What should I know about cancer screening? Depending on your health history and family history, you may need to have cancer screening at various ages. This may include screening for: Breast cancer. Cervical cancer. Colorectal cancer. Skin cancer. Lung cancer. What should I know about heart disease, diabetes, and high blood pressure? Blood pressure and heart disease High blood pressure causes heart disease and increases the risk of stroke. This is more likely to develop in people who have high blood pressure readings, are of African descent, or are overweight. Have your blood pressure checked: Every 3-5 years if you are 13-78 years of age. Every year if you are 16 years old or older. Diabetes Have regular diabetes screenings. This checks your fasting blood sugar level. Have the screening done: Once every three years after age 73 if you are at a normal weight and have a low risk for diabetes. More often and at a younger age if you are overweight or have a high risk for diabetes. What should I know about preventing infection? Hepatitis B If you  have a higher risk for hepatitis B, you should be screened for this virus. Talk with your health care provider to find out if you are at risk for hepatitis B infection. Hepatitis C Testing is recommended for: Everyone born from [birth year] through 1965. Anyone with known risk factors for hepatitis C. Sexually transmitted infections (STIs) Get screened for STIs, including gonorrhea and chlamydia, if: You are sexually active and are younger than 80 years of age. You are older than 80 years of  age and your health care provider tells you that you are at risk for this type of infection. Your sexual activity has changed since you were last screened, and you are at increased risk for chlamydia or gonorrhea. Ask your health care provider if you are at risk. Ask your health care provider about whether you are at high risk for HIV. Your health care provider may recommend a prescription medicine to help prevent HIV infection. If you choose to take medicine to prevent HIV, you should first get tested for HIV. You should then be tested every 3 months for as long as you are taking the medicine. Pregnancy If you are about to stop having your period (premenopausal) and you may become pregnant, seek counseling before you get pregnant. Take 400 to 800 micrograms (mcg) of folic acid every day if you become pregnant. Ask for birth control (contraception) if you want to prevent pregnancy. Osteoporosis and menopause Osteoporosis is a disease in which the bones lose minerals and strength with aging. This can result in bone fractures. If you are 97 years old or older, or if you are at risk for osteoporosis and fractures, ask your health care provider if you should: Be screened for bone loss. Take a calcium or vitamin D supplement to lower your risk of fractures. Be given hormone replacement therapy (HRT) to treat symptoms of menopause. Follow these instructions at home: Lifestyle Do not use any products that contain nicotine or tobacco, such as cigarettes, e-cigarettes, and chewing tobacco. If you need help quitting, ask your health care provider. Do not use street drugs. Do not share needles. Ask your health care provider for help if you need support or information about quitting drugs. Alcohol use Do not drink alcohol if: Your health care provider tells you not to drink. You are pregnant, may be pregnant, or are planning to become pregnant. If you drink alcohol: Limit how much you use to 0-1 drink a  day. Limit intake if you are breastfeeding. Be aware of how much alcohol is in your drink. In the [place] one drink equals one 12 oz bottle of beer (355 mL), one 5 oz glass of wine (148 mL), or one 1 oz glass of hard liquor (44 mL). General instructions Schedule regular health, dental, and eye exams. Stay current with your vaccines. Tell your health care provider if: You often feel depressed. You have ever been abused or do not feel safe at home. Summary Adopting a healthy lifestyle and getting preventive care are important in promoting health and wellness. Follow your health care provider's instructions about healthy diet, exercising, and getting tested or screened for diseases. Follow your health care provider's instructions on monitoring your cholesterol and blood pressure. This information is not intended to replace advice given to you by your health care provider. Make sure you discuss any questions you have with your health care provider. Document Revised: 12/23/2020 Document Reviewed: 10/08/2018 Elsevier Patient Education  2022 [employer].  Our clinic's number is [phone]. Please  call with questions or concerns about what we discussed today.

## 2021-07-05 NOTE — Progress Notes (Signed)
Subjective:   Sara [name] is a 80 y.o. female who presents for Medicare Annual (Subsequent) preventive examination.  Patient consented to have virtual visit and was identified by name and date of birth. Method of visit: Telephone  Encounter participants: Patient: Sara [name] - located at Home Nurse/Provider: Dorna Bloom - located at [facility] Others (if applicable): NA  Review of Systems: Defer to PCP.   Cardiac Risk Factors include: advanced age (>21mn, >>13women);diabetes mellitus   Objective:   Vitals: BP (!) 148/52   There is no height or weight on file to calculate BMI.  Advanced Directives 07/05/2021 06/28/2021 04/06/2021 12/15/2020 09/14/2020 06/21/2020 05/25/2020  Does Patient Have a Medical Advance Directive? No No No No No No No  Would patient like information on creating a medical advance directive? No - Patient declined No - Patient declined No - Patient declined No - Patient declined No - Patient declined No - Patient declined No - Patient declined   *Patient reports she has end of life plans in place with her daughter*  Tobacco Social History   Tobacco Use  Smoking Status Never  Smokeless Tobacco Never     Clinical Intake:  Pre-visit preparation completed: Yes  Pain Score: 0-No pain  How often do you need to have someone help you when you read instructions, pamphlets, or other written materials from your doctor or pharmacy?: 2 - Rarely What is the last grade level you completed in school?: High School  Interpreter Needed?: No  Past Medical History:  Diagnosis Date   Allergy    Dizziness 02/21/2018   Estrogen deficiency 01/13/2018   Need for prophylactic vaccination against Streptococcus pneumoniae (pneumococcus) 01/13/2018   Right knee pain 05/29/2019   Routine general medical examination at a health care facility 01/13/2018   Seasonal allergies 06/21/2020   Vaginal itching 06/21/2020   Vision changes 01/13/2018   Past Surgical History:  Procedure  Laterality Date   DG THUMB RIGHT HAND ([facility] HX) Right    EYE SURGERY     TUBAL LIGATION     Family History  Problem Relation Age of Onset   Heart disease Mother    Diabetes Mother    Alzheimer's disease Father    Cancer Sister    Cancer Brother    Alzheimer's disease Brother    Breast cancer Neg Hx    Social History   Socioeconomic History   Marital status: Widowed    Spouse name: Not on file   Number of children: 3   Years of education: 12   Highest education level: High school graduate  Occupational History   Occupation: Retired  Tobacco Use   Smoking status: Never   Smokeless tobacco: Never  Vaping Use   Vaping Use: Never used  Substance and Sexual Activity   Alcohol use: Never   Drug use: Never   Sexual activity: Not Currently  Other Topics Concern   Not on file  Social History Narrative   [place]    Patient still drives, however limits where she goes.    Patient is retired.    Patient enjoys crosswords, working in her yard, spending time with family, and she is very active in her church.    Social Determinants of Health   Financial Resource Strain: Low Risk    Difficulty of Paying Living Expenses: Not hard at all  Food Insecurity: No Food Insecurity   Worried About RCharity fundraiser  in the Last Year: Never true   [place] in the Last Year: Never true  Transportation Needs: No Transportation Needs   Lack of Transportation (Medical): No   Lack of Transportation (Non-Medical): No  Physical Activity: Insufficiently Active   Days of Exercise per Week: 2 days   Minutes of Exercise per Session: 20 min  Stress: No Stress Concern Present   Feeling of Stress : Not at all  Social Connections: Moderately Integrated   Frequency of Communication with Friends and Family: More than three times a week   Frequency of Social Gatherings with Friends and Family: More than three times a week   Attends  Religious Services: More than 4 times per year   Active Member of [employer] or Organizations: Yes   Attends Archivist Meetings: More than 4 times per year   Marital Status: Widowed   Outpatient Encounter Medications as of 07/05/2021  Medication Sig   Accu-Chek Softclix Lancets lancets TEST BLOOD SUGAR FOUR TIMES DAILY ( BEFORE MEALS AND AT BEDTIME )   canagliflozin (INVOKANA) 300 MG TABS tablet Take 1 tablet (300 mg total) by mouth daily before breakfast.   diclofenac Sodium (VOLTAREN) 1 % GEL Apply 2 g topically 4 (four) times daily.   gabapentin (NEURONTIN) 100 MG capsule Take 1 capsule (100 mg total) by mouth at bedtime.   glucose blood test strip 1 each by Other route 4 (four) times daily - after meals and at bedtime.   insulin glargine (LANTUS SOLOSTAR) 100 UNIT/ML Solostar Pen Inject 22 Units into the skin daily.   Insulin Pen Needle (PEN NEEDLES 3/16") 31G X 5 MM MISC Use as instructed to inject insulin once daily   losartan (COZAAR) 25 MG tablet Take 1 tablet (25 mg total) by mouth at bedtime.   metFORMIN (GLUCOPHAGE) 500 MG tablet Take 2 tablets (1,000 mg total) by mouth 2 (two) times daily with a meal.   atorvastatin (LIPITOR) 40 MG tablet Take 1 tablet (40 mg total) by mouth daily. (Patient not taking: Reported on 07/05/2021)   No facility-administered encounter medications on file as of 07/05/2021.   Activities of Daily Living In your present state of health, do you have any difficulty performing the following activities: 07/05/2021  Hearing? N  Vision? N  Difficulty concentrating or making decisions? N  Walking or climbing stairs? N  Dressing or bathing? N  Doing errands, shopping? N  Preparing Food and eating ? N  Using the Toilet? N  In the past six months, have you accidently leaked urine? N  Do you have problems with loss of bowel control? N  Managing your Medications? N  Managing your Finances? N  Housekeeping or managing your Housekeeping? N  Some recent data  might be hidden   Patient Care Team: Lyndee Hensen, DO as PCP - General (Family Medicine)    Assessment:   This is a routine wellness examination for [facility].  Exercise Activities and Dietary recommendations Current Exercise Habits: Home exercise routine, Type of exercise: stretching, Time (Minutes): 20, Frequency (Times/Week): 2, Weekly Exercise (Minutes/Week): 40, Exercise limited by: orthopedic condition(s)   Goals      Blood Pressure < 140/90       Fall Risk Fall Risk  07/05/2021 06/28/2021 04/06/2021 12/15/2020 06/21/2020  Falls in the past year? 0 0 0 0 0  Number falls in past yr: 0 0 0 0 0  Injury with Fall? 0 0 0 0 0  Risk for fall due to :  No Fall Risks - - - -  Follow up Falls prevention discussed - - - -   Patient reports her gait is steady and appropriate. Patient does not use any assistive devices to ambulate.  Is the patient's home free of loose throw rugs in walkways, pet beds, electrical cords, etc?   yes      Grab bars in the bathroom? yes      Handrails on the stairs?   yes      Adequate lighting?   yes  Patient rating of health (0-10) scale: 9  Depression Screen PHQ 2/9 Scores 07/05/2021 06/28/2021 04/06/2021 12/15/2020  PHQ - 2 Score 1 1 0 0  PHQ- 9 Score 1 1 0 0    Cognitive Function  6CIT Screen 07/05/2021  What Year? 0 points  What time? 0 points  Count back from 20 2 points  Months in reverse 2 points  Repeat phrase 2 points   Immunization History  Administered Date(s) Administered   Fluad Quad(high Dose 65+) 08/12/2019, 09/14/2020   PFIZER Comirnaty(Gray Top)Covid-19 Tri-Sucrose Vaccine 04/06/2021   PFIZER(Purple Top)SARS-COV-2 Vaccination 12/21/2019, 01/11/2020, 09/17/2020   Pneumococcal Conjugate-13 01/13/2018   Pneumococcal Polysaccharide-23 08/12/2019   Qualifies for Shingles Vaccine? Yes   Zostavax completed No   Shingrix Completed?: No.    Education has been provided regarding the importance of this vaccine. Patient has been advised to call  insurance company to determine out of pocket expense if they have not yet received this vaccine. Advised may also receive vaccine at local pharmacy or Health Dept. Verbalized acceptance and understanding.  Screening Tests Health Maintenance  Topic Date Due   OPHTHALMOLOGY EXAM  Never done   Zoster Vaccines- Shingrix (1 of 2) Never done   INFLUENZA VACCINE  05/29/2021   FOOT EXAM  09/14/2021   HEMOGLOBIN A1C  10/06/2021   TETANUS/TDAP  03/25/2028   DEXA SCAN  Completed   COVID-19 Vaccine  Completed   PNA vac Low Risk Adult  Completed   HPV VACCINES  Aged Out   Vision Screening: Recommended annual ophthalmology exams for early detection of glaucoma and other disorders of the eye.  Cancer Screenings: Lung: Low Dose CT Chest recommended if Age 68-80 years, 30 pack-year currently smoking OR have quit w/in 15years. Patient does not qualify. Breast:  Up to date on Mammogram? 05/25/2020 Up to date of Bone Density/Dexa? UTD Colorectal: No record of Colonoscopy   Additional Screenings: Hepatitis C Screening: Completed   Plan:  Continue to monitor your blood pressure at home.  Your goal is 140/90. I will put in a refill for Invokana. Flu shots are available at [facility]!  I have personally reviewed and noted the following in the patient's chart:   Medical and social history Use of alcohol, tobacco or illicit drugs  Current medications and supplements Functional ability and status Nutritional status Physical activity Advanced directives List of other physicians Hospitalizations, surgeries, and ER visits in previous 12 months Vitals Screenings to include cognitive, depression, and falls Referrals and appointments  In addition, I have reviewed and discussed with patient certain preventive protocols, quality metrics, and best practice recommendations. A written personalized care plan for preventive services as well as general preventive health recommendations were provided to  patient.  This visit was conducted virtually in the setting of the [place] pandemic.    Dorna Bloom, [place]  07/05/2021

## 2021-07-05 NOTE — Patient Instructions (Signed)
Thank you for coming into the office today.   As discussed, we increased your blood pressure medication (Losartan) to 50 mg daily. You can take 2-25 mg tablets to finish up your current supply. Stop by the pharmacy to pick up your new 50 mg tablet and take one daily.   I referred you to a ears, nose and throat doctor for your ear wax buildup. They should be able to check your hearing there as well.      Follow up with me in 1 month.   Continue taking your medications daily!   Take Care,   Dr. Susa Simmonds

## 2021-07-07 NOTE — Assessment & Plan Note (Signed)
Non-pulsatile tinnitus. Pt is a 80 yo female with constant humming in her right ear for the past 2 weeks with occassional. Pt continues to have right cerumen impaction. No improvement after irrigation. Suspect this is the cause of her sx. Can not rule out age related hearing loss, atherosclerotic disease, intracranial mass or AVM. Defer head and neck imaging until cerumen impaction can be addressed. Referral to ENT placed. Hopefully she will get her hearing assessed there as well.

## 2021-07-07 NOTE — Assessment & Plan Note (Addendum)
Pt's BP not at goal at home or in office. Increase Losartan to 50 mg daily. Recheck BMP in 1 month at follow up.

## 2021-08-14 DIAGNOSIS — H9011 Conductive hearing loss, unilateral, right ear, with unrestricted hearing on the contralateral side: Secondary | ICD-10-CM | POA: Diagnosis not present

## 2021-08-14 DIAGNOSIS — H9311 Tinnitus, right ear: Secondary | ICD-10-CM | POA: Diagnosis not present

## 2021-08-22 DIAGNOSIS — H9311 Tinnitus, right ear: Secondary | ICD-10-CM | POA: Diagnosis not present

## 2021-08-22 DIAGNOSIS — H9041 Sensorineural hearing loss, unilateral, right ear, with unrestricted hearing on the contralateral side: Secondary | ICD-10-CM | POA: Diagnosis not present

## 2021-08-22 DIAGNOSIS — H6121 Impacted cerumen, right ear: Secondary | ICD-10-CM | POA: Diagnosis not present

## 2021-08-30 ENCOUNTER — Other Ambulatory Visit: Payer: Self-pay | Admitting: Family Medicine

## 2021-08-30 ENCOUNTER — Other Ambulatory Visit: Payer: Self-pay | Admitting: Otolaryngology

## 2021-08-30 DIAGNOSIS — E119 Type 2 diabetes mellitus without complications: Secondary | ICD-10-CM

## 2021-08-30 DIAGNOSIS — N182 Chronic kidney disease, stage 2 (mild): Secondary | ICD-10-CM

## 2021-08-30 DIAGNOSIS — Z794 Long term (current) use of insulin: Secondary | ICD-10-CM

## 2021-08-30 DIAGNOSIS — I10 Essential (primary) hypertension: Secondary | ICD-10-CM

## 2021-08-30 DIAGNOSIS — H903 Sensorineural hearing loss, bilateral: Secondary | ICD-10-CM

## 2021-09-28 ENCOUNTER — Ambulatory Visit: Payer: Medicare HMO | Admitting: Family Medicine

## 2021-10-05 ENCOUNTER — Other Ambulatory Visit: Payer: Self-pay

## 2021-10-05 ENCOUNTER — Encounter: Payer: Self-pay | Admitting: Family Medicine

## 2021-10-05 ENCOUNTER — Ambulatory Visit (INDEPENDENT_AMBULATORY_CARE_PROVIDER_SITE_OTHER): Payer: Medicare HMO | Admitting: Family Medicine

## 2021-10-05 DIAGNOSIS — I7090 Unspecified atherosclerosis: Secondary | ICD-10-CM | POA: Diagnosis not present

## 2021-10-05 DIAGNOSIS — E119 Type 2 diabetes mellitus without complications: Secondary | ICD-10-CM | POA: Diagnosis not present

## 2021-10-05 DIAGNOSIS — G5793 Unspecified mononeuropathy of bilateral lower limbs: Secondary | ICD-10-CM

## 2021-10-05 DIAGNOSIS — N182 Chronic kidney disease, stage 2 (mild): Secondary | ICD-10-CM

## 2021-10-05 DIAGNOSIS — Z794 Long term (current) use of insulin: Secondary | ICD-10-CM | POA: Diagnosis not present

## 2021-10-05 DIAGNOSIS — I10 Essential (primary) hypertension: Secondary | ICD-10-CM | POA: Diagnosis not present

## 2021-10-05 LAB — POCT GLYCOSYLATED HEMOGLOBIN (HGB A1C): HbA1c, POC (controlled diabetic range): 7.4 % — AB (ref 0.0–7.0)

## 2021-10-05 MED ORDER — "PEN NEEDLES 3/16"" 31G X 5 MM MISC": 3 refills | Status: DC

## 2021-10-05 MED ORDER — GLUCOSE BLOOD VI STRP: 1.0000 | ORAL_STRIP | Freq: Three times a day (TID) | 12 refills | Status: DC

## 2021-10-05 MED ORDER — LANTUS SOLOSTAR 100 UNIT/ML ~~LOC~~ SOPN
22.0000 [IU] | PEN_INJECTOR | Freq: Every day | SUBCUTANEOUS | 1 refills | Status: DC
Start: 2021-10-05 — End: 2021-11-03

## 2021-10-05 MED ORDER — GABAPENTIN 100 MG PO CAPS: 100.0000 mg | ORAL_CAPSULE | Freq: Every day | ORAL | 3 refills | Status: DC

## 2021-10-05 MED ORDER — CANAGLIFLOZIN 300 MG PO TABS: 300.0000 mg | ORAL_TABLET | Freq: Every day | ORAL | 11 refills | Status: DC

## 2021-10-05 MED ORDER — LANTUS SOLOSTAR 100 UNIT/ML ~~LOC~~ SOPN: 22.0000 [IU] | PEN_INJECTOR | Freq: Every day | SUBCUTANEOUS | 1 refills | Status: DC

## 2021-10-05 MED ORDER — LOSARTAN POTASSIUM 50 MG PO TABS: 50.0000 mg | ORAL_TABLET | Freq: Every day | ORAL | 2 refills | Status: DC

## 2021-10-05 NOTE — Patient Instructions (Signed)
It was great seeing you today!  Please check-out at the front desk before leaving the clinic. I'd like to see you back in 3-6 months  but if you need to be seen earlier than that for any new issues we're happy to fit you in, just give [name] a call!  Visit Remembers: - Stop by the pharmacy to pick up your prescriptions  - Continue to work on your healthy eating habits and incorporating exercise into your daily life. (see below) - Your goal is to have an A1c < 8.  Your a1c was 7.3 today.  - Medicine Changes: None    Diet Recommendations for Diabetes  Carbohydrate includes starch, sugar, and fiber.  Of these, only sugar and starch raise blood glucose.  (Fiber is found in fruits, vegetables [especially skin, seeds, and stalks] and whole grains.)   Starchy (carb) foods: Bread, rice, pasta, potatoes, corn, cereal, grits, crackers, bagels, muffins, all baked goods.  (Fruit, milk, and yogurt also have carbohydrate, but most of these foods will not spike your blood sugar as most starchy foods will.)  A few fruits do cause high blood sugars; use small portions of bananas (limit to 1/2 at a time), grapes, watermelon, oranges, and most tropical fruits.   Protein foods: Meat, fish, poultry, eggs, dairy foods, and beans such as pinto and kidney beans (beans also provide carbohydrate).   1. Eat at least REAL 3 meals and 1-2 snacks per day. Never go more than 4-5 hours while awake without eating. Eat breakfast within the first hour of getting up.   2. Limit starchy foods to TWO per meal and ONE per snack. ONE portion of a starchy food is equal to the following:   - ONE slice of bread (or its equivalent, such as half of a hamburger bun).   - 1/2 cup of a "scoopable" starchy food such as potatoes or rice.   - 15 grams of Total Carbohydrate as shown on food label.   - Every 4 ounces of a sweet drink (including fruit juice). 3. Include at every meal: a protein food, a carb food, and vegetables and/or fruit.   -  Obtain twice the volume of veg's as protein or carbohydrate foods for both lunch and dinner.   - Fresh or frozen veg's are best.   - Keep frozen veg's on hand for a quick vegetable serving.       Regarding lab work today:  Due to recent changes in healthcare laws, you may see the results of your imaging and laboratory studies on MyChart before your doctor has had a chance to review them.  We understand that in some cases there may be results that are confusing or concerning to you. Not all laboratory results come back in the same time frame and your doctor may be waiting for multiple results in order to interpret others.  Please give [name] 72 hours in order for your doctor to thoroughly review all the results before contacting the office for clarification of your results. If everything is normal, you will get a letter in the mail or a message in My Chart. Please give [name] a call if you do not hear from [name] after 2 weeks.  Please bring all of your medications with you to each visit.    If you haven't already, sign up for My Chart to have easy access to your labs results, and communication with your primary care physician.  Feel free to call with any questions or concerns  at any time, at [phone].   Take care,  Dr. Rushie [facility]

## 2021-10-05 NOTE — Progress Notes (Signed)
   SUBJECTIVE:   CHIEF COMPLAINT / HPI:   Chief Complaint  Patient presents with   Check up     Sara [name] is a 80 y.o. female here for follow up:   Diabetes Mellitus  Watches what she eats. Continues exercising. Denies missing any doses of DM medications . CBG 161. Typically 130-140 on avg. Dropped to 93 and she ate a snack.   HTN Denies missing doses of antihypertensive medications. Denies chest pain, palpitations, lower extremity edema, exertional dyspnea, lightheadedness, headaches and vision changes.  Systolic BP 683F-290S at home.  Home BP Monitoring: Yes  Exercises:  walks, chair aerobic  Low salt diet: yes       PERTINENT  PMH / PSH: reviewed and updated as appropriate   OBJECTIVE:   BP (!) 105/91   Pulse 76   Ht 5\' 1"  (1.549 m)   Wt 126 lb (57.2 kg)   SpO2 100%   BMI 23.81 kg/m    GEN: pleasant well appearing elderly female, in no acute distress  CV: regular rate and rhythm RESP: no increased work of breathing, clear to ascultation bilaterally ABD: Soft, non-tender, non-distended.  MSK: no LE edema SKIN: warm, dry NEURO: alert and oriented    ASSESSMENT/PLAN:   HTN (hypertension) BP at goal.  Continue Losartan 50 mg daily.  - BMP today   Atherosclerotic vascular disease Stable. Continue Lipitor. Lipid panel today.   CKD (chronic kidney disease) stage 2, GFR 60-89 ml/min Stage 3 . BMP today.  Type 2 diabetes mellitus without complication, with long-term current use of insulin (HCC) Stable. A1C at goal; 7.4 today from 7.3 in June.   Refilled glucose monitoring supplies. Continue 22u insulin, Invokana, and metformin. Pt hesitant to reduce insulin dose today.  - BMP and lipid panel today      Lyndee Hensen, DO PGY-3, [facility] 10/05/2021

## 2021-10-06 LAB — BASIC METABOLIC PANEL
BUN/Creatinine Ratio: 27 (ref 12–28)
BUN: 30 mg/dL — ABNORMAL HIGH (ref 8–27)
CO2: 22 mmol/L (ref 20–29)
Calcium: 10.1 mg/dL (ref 8.7–10.3)
Chloride: 104 mmol/L (ref 96–106)
Creatinine, Ser: 1.12 mg/dL — ABNORMAL HIGH (ref 0.57–1.00)
Glucose: 128 mg/dL — ABNORMAL HIGH (ref 70–99)
Potassium: 4.9 mmol/L (ref 3.5–5.2)
Sodium: 140 mmol/L (ref 134–144)
eGFR: 50 mL/min/{1.73_m2} — ABNORMAL LOW (ref >59)

## 2021-10-06 LAB — LIPID PANEL
Chol/HDL Ratio: 3.7 ratio (ref 0.0–4.4)
Cholesterol, Total: 155 mg/dL (ref 100–199)
HDL: 42 mg/dL (ref >39)
LDL Chol Calc (NIH): 94 mg/dL (ref 0–99)
Triglycerides: 106 mg/dL (ref 0–149)
VLDL Cholesterol Cal: 19 mg/dL (ref 5–40)

## 2021-10-08 ENCOUNTER — Encounter: Payer: Self-pay | Admitting: Family Medicine

## 2021-10-08 NOTE — Assessment & Plan Note (Addendum)
Stable. Continue Lipitor. Lipid panel today.

## 2021-10-08 NOTE — Assessment & Plan Note (Addendum)
BP at goal.  Continue Losartan 50 mg daily.  - BMP today

## 2021-10-08 NOTE — Assessment & Plan Note (Addendum)
Stable. A1C at goal; 7.4 today from 7.3 in June.   Refilled glucose monitoring supplies. Continue 22u insulin, Invokana, and metformin. Pt hesitant to reduce insulin dose today.  - BMP and lipid panel today

## 2021-10-08 NOTE — Assessment & Plan Note (Addendum)
Stage 3 . BMP today.

## 2021-11-03 ENCOUNTER — Other Ambulatory Visit: Payer: Self-pay

## 2021-11-03 DIAGNOSIS — E119 Type 2 diabetes mellitus without complications: Secondary | ICD-10-CM

## 2021-11-03 DIAGNOSIS — Z794 Long term (current) use of insulin: Secondary | ICD-10-CM

## 2021-11-03 NOTE — Telephone Encounter (Signed)
Patient calls nurse line regarding Lantus rx. Patient reports that she is out of medication and has not yet received shipment from [facility].   Patient is requesting single pen be sent to [facility] to last until she receives shipment from [facility].   Please advise.   Talbot Grumbling, RN

## 2021-11-04 MED ORDER — LANTUS SOLOSTAR 100 UNIT/ML ~~LOC~~ SOPN: 22.0000 [IU] | PEN_INJECTOR | Freq: Every day | SUBCUTANEOUS | 2 refills | Status: DC

## 2021-11-04 NOTE — Telephone Encounter (Signed)
Insulin sent to local pharmacy as she is out of medication. Please alert pt that medication is at [facility] for pickup.   Lyndee Hensen, DO

## 2022-01-08 ENCOUNTER — Other Ambulatory Visit: Payer: Self-pay | Admitting: Family Medicine

## 2022-01-08 DIAGNOSIS — N182 Chronic kidney disease, stage 2 (mild): Secondary | ICD-10-CM

## 2022-01-08 DIAGNOSIS — I10 Essential (primary) hypertension: Secondary | ICD-10-CM

## 2022-01-08 DIAGNOSIS — E119 Type 2 diabetes mellitus without complications: Secondary | ICD-10-CM

## 2022-01-08 DIAGNOSIS — Z794 Long term (current) use of insulin: Secondary | ICD-10-CM

## 2022-02-28 ENCOUNTER — Ambulatory Visit: Payer: Medicare HMO | Admitting: Family Medicine

## 2022-03-05 ENCOUNTER — Ambulatory Visit (INDEPENDENT_AMBULATORY_CARE_PROVIDER_SITE_OTHER): Payer: Medicare HMO

## 2022-03-05 ENCOUNTER — Ambulatory Visit (INDEPENDENT_AMBULATORY_CARE_PROVIDER_SITE_OTHER): Payer: Medicare HMO | Admitting: Family Medicine

## 2022-03-05 ENCOUNTER — Encounter: Payer: Self-pay | Admitting: Family Medicine

## 2022-03-05 VITALS — BP 139/65 | HR 76 | Ht 61.0 in | Wt 129.2 lb

## 2022-03-05 DIAGNOSIS — I10 Essential (primary) hypertension: Secondary | ICD-10-CM | POA: Diagnosis not present

## 2022-03-05 DIAGNOSIS — Z23 Encounter for immunization: Secondary | ICD-10-CM

## 2022-03-05 DIAGNOSIS — R350 Frequency of micturition: Secondary | ICD-10-CM

## 2022-03-05 DIAGNOSIS — Z794 Long term (current) use of insulin: Secondary | ICD-10-CM | POA: Diagnosis not present

## 2022-03-05 DIAGNOSIS — E119 Type 2 diabetes mellitus without complications: Secondary | ICD-10-CM

## 2022-03-05 DIAGNOSIS — N1831 Chronic kidney disease, stage 3a: Secondary | ICD-10-CM | POA: Diagnosis not present

## 2022-03-05 DIAGNOSIS — I7090 Unspecified atherosclerosis: Secondary | ICD-10-CM | POA: Diagnosis not present

## 2022-03-05 LAB — POCT GLYCOSYLATED HEMOGLOBIN (HGB A1C): HbA1c, POC (controlled diabetic range): 7.2 % — AB (ref 0.0–7.0)

## 2022-03-05 MED ORDER — LANTUS SOLOSTAR 100 UNIT/ML ~~LOC~~ SOPN: 18.0000 [IU] | PEN_INJECTOR | Freq: Every day | SUBCUTANEOUS | 2 refills | Status: DC

## 2022-03-05 MED ORDER — ATORVASTATIN CALCIUM 40 MG PO TABS: 40.0000 mg | ORAL_TABLET | Freq: Every day | ORAL | 3 refills | Status: DC

## 2022-03-05 MED ORDER — CANAGLIFLOZIN 300 MG PO TABS: 300.0000 mg | ORAL_TABLET | Freq: Every day | ORAL | 11 refills | Status: DC

## 2022-03-05 MED ORDER — LANTUS SOLOSTAR 100 UNIT/ML ~~LOC~~ SOPN: 22.0000 [IU] | PEN_INJECTOR | Freq: Every day | SUBCUTANEOUS | 2 refills | Status: DC

## 2022-03-05 NOTE — Assessment & Plan Note (Signed)
BP at goal.  Continue Losartan 50 mg daily.  ?

## 2022-03-05 NOTE — Assessment & Plan Note (Addendum)
Reviewed BMP from Dec 2022 serum Cr 1.12 with GFR 50.  There was a increase from baseline. Repeat BMP today.  Evaluate for proteinuria with spot urine. Avoid nephrotoxic agents.  ?

## 2022-03-05 NOTE — Patient Instructions (Addendum)
It was great seeing you today. Happy early birthday.  ? ?Be sure to get your eye exam this year.  Call your eye doctor to schedule an appointment.  ? ?Please check-out at the front desk before leaving the clinic. I'd like to see you back in 3 months but if you need to be seen earlier than that for any new issues we're happy to fit you in, just give [name] a call! ? ?Visit Remembers: ?- Your prescriptions will be mailed to you ?- Continue to work on your healthy eating habits and incorporating exercise into your daily life.  ?- Your goal is to have an BP < 140/90 ?- Medicine Changes: Decrease insulin to 20 units daily ? ?Regarding lab work today:  ?Due to recent changes in healthcare laws, you may see the results of your imaging and laboratory studies on MyChart before your provider has had a chance to review them.  I understand that in some cases there may be results that are confusing or concerning to you. Not all laboratory results come back in the same time frame and you may be waiting for multiple results in order to interpret others.  Please give [name] 72 hours in order for your provider to thoroughly review all the results before contacting the office for clarification of your results. If everything is normal, you will get a letter in the mail or a message in My Chart. Please give [name] a call if you do not hear from [name] after 2 weeks. ? ?Please bring all of your medications with you to each visit.  ? ?Feel free to call with any questions or concerns at any time, at [phone]. ?  ?Take care,  ?Dr. Susa Simmonds ?[place]  ?

## 2022-03-05 NOTE — Progress Notes (Signed)
? ?  SUBJECTIVE:  ? ?CHIEF COMPLAINT / HPI:  ? ? ? ?Sara [name] is a 81 y.o. female here for follow up: ? ?Diabetes Mellitus  ?Takes metformin and insulin daily. Home blood sugars range 93-202.  Has not been taking Invokana as she ran out. Denies increased thirst, hunger, or frequent urination. She eats a snack at bedtime.  ? ? ?HTN ?Takes Losartan. Denies missing doses of antihypertensive medications. Denies chest pain, palpitations, lower extremity edema, exertional dyspnea, lightheadedness, headaches and vision changes. Remains active in her church.  ? ? ?HLD ?Ran out of Lipitor. Reports no medication side effects. ? ? ? ?PERTINENT  PMH / PSH: reviewed and updated as appropriate  ? ?OBJECTIVE:  ? ?BP 139/65   Pulse 76   Ht '5\' 1"'$  (1.549 m)   Wt 129 lb 3.2 oz (58.6 kg)   SpO2 100%   BMI 24.41 kg/m?   ? ?GEN: pleasant well appearing elderly female, in no acute distress  ?CV: regular rate and rhythm ?RESP: no increased work of breathing, clear to ascultation bilaterally ?MSK: no LE edema ?SKIN: warm, dry ? ? ?ASSESSMENT/PLAN:  ? ?HTN (hypertension) ?BP at goal.  Continue Losartan 50 mg daily.  ? ?Atherosclerotic vascular disease ?Stable. Previous LDL 94 in Dec 2022. Continue Lipitor. Unable to calculate ASCVD risk. Previous as high as 34-45%.  ? ?Stage 3a chronic kidney disease (CKD) ([place]) ?Reviewed BMP from Dec 2022 serum Cr 1.12 with GFR 50.  There was a increase from baseline. Repeat BMP today.  Evaluate for proteinuria with spot urine. Avoid nephrotoxic agents.  ? ?Type 2 diabetes mellitus without complication, with long-term current use of insulin ([place]) ?Stable. A1C at goal; 7.2 today from 7.4 in Dec. No refills for glucose monitoring supplies requested. Decrease insulin to 18u, restart Invokana, and continue metformin. Encouraged continued diet rich in vegetables and complex carbs.  Heart healthy carb modified diet. Counseled on need to continue exercising.  ? ?Statin therapy: Lipitor  ?ACEi/ARB:  Losartan   ?Urine albumin/Cr ratio:  collect today ?Eye exam: Advised  ?  ? ? ? ?Lyndee Hensen, DO    ?PGY-3, [facility] ?03/05/2022  ? ? ? ? ? ? ? ? ?

## 2022-03-05 NOTE — Assessment & Plan Note (Signed)
Stable. A1C at goal; 7.2 today from 7.4 in Dec. No refills for glucose monitoring supplies requested. Decrease insulin to 18u, restart Invokana, and continue metformin. Encouraged continued diet rich in vegetables and complex carbs.  Heart healthy carb modified diet. Counseled on need to continue exercising.  ? ?Statin therapy: Lipitor  ?ACEi/ARB: Losartan   ?Urine albumin/Cr ratio:  collect today ?Eye exam: Advised  ?

## 2022-03-05 NOTE — Assessment & Plan Note (Signed)
Stable. Previous LDL 94 in Dec 2022. Continue Lipitor. Unable to calculate ASCVD risk. Previous as high as 34-45%.  ?

## 2022-03-06 LAB — PROTEIN / CREATININE RATIO, URINE
Creatinine, Urine: 122.9 mg/dL
Protein, Ur: 40.1 mg/dL
Protein/Creat Ratio: 326 mg/g creat — ABNORMAL HIGH (ref 0–200)

## 2022-03-06 LAB — BASIC METABOLIC PANEL
BUN/Creatinine Ratio: 24 (ref 12–28)
BUN: 24 mg/dL (ref 8–27)
CO2: 20 mmol/L (ref 20–29)
Calcium: 10.2 mg/dL (ref 8.7–10.3)
Chloride: 104 mmol/L (ref 96–106)
Creatinine, Ser: 1 mg/dL (ref 0.57–1.00)
Glucose: 115 mg/dL — ABNORMAL HIGH (ref 70–99)
Potassium: 4.6 mmol/L (ref 3.5–5.2)
Sodium: 141 mmol/L (ref 134–144)
eGFR: 57 mL/min/{1.73_m2} — ABNORMAL LOW (ref >59)

## 2022-03-08 LAB — URINE CULTURE

## 2022-03-08 MED ORDER — CEPHALEXIN 500 MG PO CAPS: 500.0000 mg | ORAL_CAPSULE | Freq: Four times a day (QID) | ORAL | 0 refills | Status: AC

## 2022-04-03 ENCOUNTER — Encounter: Payer: Self-pay | Admitting: *Deleted

## 2022-05-02 ENCOUNTER — Other Ambulatory Visit: Payer: Self-pay | Admitting: Family Medicine

## 2022-05-02 DIAGNOSIS — Z794 Long term (current) use of insulin: Secondary | ICD-10-CM

## 2022-05-02 DIAGNOSIS — I10 Essential (primary) hypertension: Secondary | ICD-10-CM

## 2022-05-02 DIAGNOSIS — N182 Chronic kidney disease, stage 2 (mild): Secondary | ICD-10-CM

## 2022-06-01 ENCOUNTER — Ambulatory Visit (INDEPENDENT_AMBULATORY_CARE_PROVIDER_SITE_OTHER): Payer: Medicare HMO | Admitting: Student

## 2022-06-01 VITALS — BP 110/62 | HR 74 | Ht 61.0 in | Wt 121.0 lb

## 2022-06-01 DIAGNOSIS — R011 Cardiac murmur, unspecified: Secondary | ICD-10-CM

## 2022-06-01 DIAGNOSIS — N390 Urinary tract infection, site not specified: Secondary | ICD-10-CM

## 2022-06-01 DIAGNOSIS — R300 Dysuria: Secondary | ICD-10-CM

## 2022-06-01 LAB — POCT URINALYSIS DIP (MANUAL ENTRY)
Bilirubin, UA: NEGATIVE
Glucose, UA: >=1000 mg/dL — AB
Nitrite, UA: POSITIVE — AB
Protein Ur, POC: 100 mg/dL — AB
Spec Grav, UA: 1.025 (ref 1.010–1.025)
Urobilinogen, UA: 0.2 E.U./dL
pH, UA: 5 (ref 5.0–8.0)

## 2022-06-01 LAB — POCT UA - MICROSCOPIC ONLY

## 2022-06-01 MED ORDER — CEPHALEXIN 500 MG PO CAPS: 500.0000 mg | ORAL_CAPSULE | Freq: Two times a day (BID) | ORAL | 0 refills | Status: AC

## 2022-06-01 NOTE — Assessment & Plan Note (Signed)
No prior documentation of murmur, 3/6 holosystolic murmur at LUSB.  Asymptomatic (denies chest pain, dyspnea on exertion, syncopal events).  Shared decision making opted to hold off on echocardiogram.  Return precautions discussed which if becoming symptomatic may reconsider echocardiogram.

## 2022-06-01 NOTE — Assessment & Plan Note (Signed)
Urinary frequency with similar sensation to prior UTIs.  Many bacteria with positive nitrite and trace leukocytes.  Significant glucosuria likely related to Invokana.  Keflex 500 mg twice daily x7 days.  Will send for urine culture given complicated medical history.

## 2022-06-01 NOTE — Patient Instructions (Addendum)
It was great to see you today! Thank you for choosing [facility] for your primary care. Sara [name] was seen for urinary frequency.  Today we addressed: UTI: I prescribed Keflex 500 mg twice daily x7 days.  Please ensure that you finish this medication so the UTI but will resolve.  I am collecting a urine culture to make sure that we were using the appropriate antibiotic to treat the bacteria that is causing problems for you.  I will be in touch regarding the result for this. New murmur: We decided that we would hold off on this for now given that you are asymptomatic.  Should you begin experiencing any further chest pain, shortness of breath on exertion, syncopal events or times where you feel as if you are going to pass out or do pass out, we will readdress this.  If you haven't already, sign up for My Chart to have easy access to your labs results, and communication with your primary care physician.  We are checking some labs today. If they are abnormal, I will call you. If they are normal, I will send you a MyChart message (if it is active) or a letter in the mail. If you do not hear about your labs in the next 2 weeks, please call the office.   You should return to our clinic Return if symptoms worsen or fail to improve.  I recommend that you always bring your medications to each appointment as this makes it easy to ensure you are on the correct medications and helps [name] not miss refills when you need them.  Please arrive 15 minutes before your appointment to ensure smooth check in process.  We appreciate your efforts in making this happen.  Please call the clinic at [phone] if your symptoms worsen or you have any concerns.  Thank you for allowing me to participate in your care, Wells Guiles, DO 06/01/2022, 10:46 AM PGY-2, [place]

## 2022-06-01 NOTE — Progress Notes (Signed)
  SUBJECTIVE:   CHIEF COMPLAINT / HPI:   DYSURIA  Pressure while urinating started 5 days ago. Stated she had UTI 2-3 years ago.  Medications tried: none Any antibiotics in the last 30 days: no More than 3 UTIs in the last 12 months: no STD exposure: no  Symptoms Urgency: yes Frequency: yes Blood in urine: no Pain in back: no Fever: no Vaginal discharge: no Mouth Ulcers: no  Murmur: No chest pain, dyspnea on exertion, near syncopal or syncopal events.  PERTINENT  PMH / PSH: CKD 3A, T2DM, HTN  OBJECTIVE:  BP 110/62   Pulse 74   Ht '5\' 1"'$  (1.549 m)   Wt 121 lb (54.9 kg)   SpO2 98%   BMI 22.86 kg/m   General: NAD, pleasant, able to participate in exam Cardiac: RRR, 3/6 holosystolic murmur at LUSB Respiratory: CTAB, normal WOB Abdomen: soft, non-tender, non-distended, no suprapubic or CVA tenderness Psych: Normal affect and mood  ASSESSMENT/PLAN:  UTI (urinary tract infection), uncomplicated Urinary frequency with similar sensation to prior UTIs.  Many bacteria with positive nitrite and trace leukocytes.  Significant glucosuria likely related to Invokana.  Keflex 500 mg twice daily x7 days.  Will send for urine culture given complicated medical history.  Murmur, cardiac No prior documentation of murmur, 3/6 holosystolic murmur at LUSB.  Asymptomatic (denies chest pain, dyspnea on exertion, syncopal events).  Shared decision making opted to hold off on echocardiogram.  Return precautions discussed which if becoming symptomatic may reconsider echocardiogram.  Return if symptoms worsen or fail to improve. Wells Guiles, DO 06/01/2022, 10:52 AM PGY-2, [place]

## 2022-06-04 LAB — URINE CULTURE

## 2022-07-30 ENCOUNTER — Ambulatory Visit (INDEPENDENT_AMBULATORY_CARE_PROVIDER_SITE_OTHER): Payer: Medicare HMO | Admitting: Student

## 2022-07-30 ENCOUNTER — Encounter: Payer: Self-pay | Admitting: Student

## 2022-07-30 VITALS — BP 141/62 | HR 86 | Ht 61.0 in | Wt 120.6 lb

## 2022-07-30 DIAGNOSIS — Z794 Long term (current) use of insulin: Secondary | ICD-10-CM

## 2022-07-30 DIAGNOSIS — H9193 Unspecified hearing loss, bilateral: Secondary | ICD-10-CM | POA: Diagnosis not present

## 2022-07-30 DIAGNOSIS — I7090 Unspecified atherosclerosis: Secondary | ICD-10-CM | POA: Diagnosis not present

## 2022-07-30 DIAGNOSIS — I10 Essential (primary) hypertension: Secondary | ICD-10-CM

## 2022-07-30 DIAGNOSIS — Z23 Encounter for immunization: Secondary | ICD-10-CM | POA: Diagnosis not present

## 2022-07-30 DIAGNOSIS — E119 Type 2 diabetes mellitus without complications: Secondary | ICD-10-CM

## 2022-07-30 DIAGNOSIS — Z0000 Encounter for general adult medical examination without abnormal findings: Secondary | ICD-10-CM | POA: Diagnosis not present

## 2022-07-30 DIAGNOSIS — N182 Chronic kidney disease, stage 2 (mild): Secondary | ICD-10-CM

## 2022-07-30 LAB — POCT GLYCOSYLATED HEMOGLOBIN (HGB A1C): HbA1c, POC (controlled diabetic range): 7.3 % — AB (ref 0.0–7.0)

## 2022-07-30 MED ORDER — LOSARTAN POTASSIUM 50 MG PO TABS: 50.0000 mg | ORAL_TABLET | Freq: Every day | ORAL | 0 refills | Status: DC

## 2022-07-30 NOTE — Assessment & Plan Note (Addendum)
Recheck for murmur; could not hear one today.  Lipid panel check today  Never had an echo; could consider this in the future with shared decision making.

## 2022-07-30 NOTE — Assessment & Plan Note (Addendum)
BP: (!) 141/62 today. Well controlled. Goal of <140/90. Continue to work on healthy dietary habits and exercise. Follow up in .   Medication regimen: Losartan 50 mg

## 2022-07-30 NOTE — Assessment & Plan Note (Signed)
Likely age related hearing loss, referral to audiology to assess.

## 2022-07-30 NOTE — Assessment & Plan Note (Signed)
Recheck A1C, foot exam performed today, discussed option of Shingrix, will hold off for now.

## 2022-07-30 NOTE — Assessment & Plan Note (Addendum)
A1C check today, plan to decrease Lantus dose if able.  We will do this to prevent risk of hypoglycemic episodes in an elderly patient. Ordered UACr but was unable to void, will obtain at next check.

## 2022-07-30 NOTE — Patient Instructions (Addendum)
It was great to see you today! Thank you for choosing [facility] for your primary care. Sara [name] was seen for follow up.  Today we addressed: Continuing with current medication regimen  The audiologist with call you with your appt   I will call with your lab results   If you haven't already, sign up for My Chart to have easy access to your labs results, and communication with your primary care physician.  We are checking some labs today. If they are abnormal, I will call you. If they are normal, I will send you a MyChart message (if it is active) or a letter in the mail. If you do not hear about your labs in the next 2 weeks, please call the office. I recommend that you always bring your medications to each appointment as this makes it easy to ensure you are on the correct medications and helps [name] not miss refills when you need them. Call the clinic at [phone] if your symptoms worsen or you have any concerns.  You should return to our clinic Return in about 3 months (around 10/30/2022) for A1C. Please arrive 15 minutes before your appointment to ensure smooth check in process.  We appreciate your efforts in making this happen.  Thank you for allowing me to participate in your care, Erskine Emery, MD 07/30/2022, 10:35 AM PGY-2, [place]

## 2022-07-30 NOTE — Progress Notes (Signed)
SUBJECTIVE:   CHIEF COMPLAINT / HPI:   Type 2 Diabetes: Home medications include: Lantus 22U, Invokana, Metformin 1000 mg twice daily with meals. Does endorse compliance.   Most recent A1Cs:  Lab Results  Component Value Date   HGBA1C 7.3 (A) 07/30/2022   HGBA1C 7.2 (A) 03/05/2022   Last Microalbumin, LDL, Creatinine: Lab Results  Component Value Date   MICROALBUR 80 08/12/2019   [place] 94 10/05/2021   CREATININE 1.00 03/05/2022    Patient is up to date on diabetic eye. Patient is not up to date on diabetic foot exam.   Patient and daughter also reports that she has had difficulty with hearing bilaterally, requiring others to increase the volume of their speech and to repeat themselves often.   HM: Needs shingrix, foot exam, flu vaccine    PERTINENT  PMH / PSH: Diabetes, difficulty hearing, hypertension, stage III kidney disease  Past Medical History:  Diagnosis Date   Allergy    Dizziness 02/21/2018   Estrogen deficiency 01/13/2018   Need for prophylactic vaccination against Streptococcus pneumoniae (pneumococcus) 01/13/2018   Right knee pain 05/29/2019   Routine general medical examination at a health care facility 01/13/2018   Seasonal allergies 06/21/2020   Vaginal itching 06/21/2020   Vision changes 01/13/2018    OBJECTIVE:  BP (!) 141/62   Pulse 86   Ht '5\' 1"'$  (1.549 m)   Wt 120 lb 9.6 oz (54.7 kg)   SpO2 100%   BMI 22.79 kg/m   General: NAD, pleasant, able to participate in exam; elderly AAF  Cardiac: RRR, no murmurs auscultated Respiratory: CTAB, normal WOB Abdomen: soft, non-tender, non-distended, normoactive bowel sounds Extremities: warm and well perfused, no edema or cyanosis Skin: warm and dry, no rashes noted Neuro: alert, no obvious focal deficits, speech normal Psych: Normal affect and mood  Diabetic Foot Exam - Simple   Simple Foot Form Diabetic Foot exam was performed with the following findings: Yes 07/30/2022 12:46 PM  Visual  Inspection No deformities, no ulcerations, no other skin breakdown bilaterally: Yes Sensation Testing Intact to touch and monofilament testing bilaterally: Yes Pulse Check Posterior Tibialis and Dorsalis pulse intact bilaterally: Yes Comments    Sensation mildly decreased throughout with good proprioception.   ASSESSMENT/PLAN:  Need for immunization against influenza -     Flu Vaccine QUAD High Dose(Fluad)  Atherosclerotic vascular disease Assessment & Plan: Recheck for murmur; could not hear one today.  Lipid panel check today  Never had an echo; could consider this in the future with shared decision making.     Primary hypertension Assessment & Plan: BP: (!) 141/62 today. Well controlled. Goal of <140/90. Continue to work on healthy dietary habits and exercise. Follow up in .   Medication regimen: Losartan 50 mg    Type 2 diabetes mellitus without complication, with long-term current use of insulin (HCC) Assessment & Plan: A1C check today, plan to decrease Lantus dose if able.  We will do this to prevent risk of hypoglycemic episodes in an elderly patient. Ordered UACr but was unable to void, will obtain at next check.   Orders: -     Lipid panel -     POCT glycosylated hemoglobin (Hb A1C) -     Losartan Potassium; Take 1 tablet (50 mg total) by mouth at bedtime.  Dispense: 90 tablet; Refill: 0  Hypertension, unspecified type Assessment & Plan: BP: (!) 141/62 today. Well controlled. Goal of <140/90. Continue to work on healthy dietary habits and exercise. Follow up  in .   Medication regimen: Losartan 50 mg   Orders: -     Losartan Potassium; Take 1 tablet (50 mg total) by mouth at bedtime.  Dispense: 90 tablet; Refill: 0  CKD (chronic kidney disease) stage 2, GFR 60-89 ml/min -     Losartan Potassium; Take 1 tablet (50 mg total) by mouth at bedtime.  Dispense: 90 tablet; Refill: 0  Bilateral hearing loss, unspecified hearing loss type Assessment & Plan: Likely  age related hearing loss, referral to audiology to assess.   Orders: -     Ambulatory referral to Audiology  Healthcare maintenance Assessment & Plan: Recheck A1C, foot exam performed today, discussed option of Shingrix, will hold off for now.     Return in about 3 months (around 10/30/2022) for A1C. Erskine Emery, MD 07/30/2022, 12:51 PM PGY-2, [place]

## 2022-07-31 LAB — LIPID PANEL
Chol/HDL Ratio: 2.6 ratio (ref 0.0–4.4)
Cholesterol, Total: 140 mg/dL (ref 100–199)
HDL: 54 mg/dL (ref >39)
LDL Chol Calc (NIH): 68 mg/dL (ref 0–99)
Triglycerides: 97 mg/dL (ref 0–149)
VLDL Cholesterol Cal: 18 mg/dL (ref 5–40)

## 2022-08-07 ENCOUNTER — Encounter (HOSPITAL_COMMUNITY): Payer: Self-pay | Admitting: Student

## 2022-08-08 ENCOUNTER — Ambulatory Visit: Payer: Medicare HMO | Attending: Audiologist | Admitting: Audiologist

## 2022-08-08 DIAGNOSIS — H6122 Impacted cerumen, left ear: Secondary | ICD-10-CM | POA: Diagnosis not present

## 2022-08-08 NOTE — Procedures (Signed)
  Outpatient Audiology and [address], [place]  [id number] [phone]  AUDIOLOGICAL  EVALUATION  NAME: Sara [name]     DOB:   08/18/1941      MRN: [id number]                                                                                     DATE: 08/08/2022     REFERENT: Erskine Emery, MD STATUS: Outpatient DIAGNOSIS: Impacted Wax Left Ear     History: [name] was seen for an audiological evaluation. [name] was accompanied to the appointment by her nephew.  [name] is receiving a hearing evaluation due to concerns for difficulty hearing for a long time. [name] has difficulty hearing when she leaves her house. This difficulty began gradually. No pain or pressure reported in either ear. Tinnitus present in both ears. [name] has a history of diabetes and kidney disease which is a risk factor for hearing loss. No other relevant case history reported.   Evaluation:  Otoscopy showed a clear view of the tympanic membrane in the right ear, occluding cerumen present left ear Tympanometry results were consistent with normal middle ear pressure in the right, flat response in the left ear   Results:  The test results were reviewed with [name] and her nephew. [name] has impacted wax in the left ear. This needs to be removed for an accurate assessment of her hearing.   Recommendations: 1.   No further audiologic testing performed. Cerumen needs to be removed from left ear for accurate hearing test. Test rescheduled for 08/29/22 at 11:00am. Use Debrox as directed until then. See Erskine Emery, MD or minute clinic provider for cerumen removal after using Debrox and before next hearing test appointment.   Debrox Earwax Removal Drops are a safe and inexpensive in-home solution for wax removal. Debrox Earwax Removal Kit includes a soft rubber bulb syringe to rinse your ear after using Debrox Earwax Removal Drops. Excessive earwax build-up can lead  to ear discomfort and reduced hearing, which can affect your day-to-day life. The kit can be purchased over the counter at [place], [place], [employer], and most other pharmacies.  How to use the Debrox Earwax Removal Drops Kit:  tilt head sideways. place 5 to 10 drops into ear. tip of applicator should not enter ear canal. keep drops in ear for several minutes by keeping head tilted or placing cotton in the ear. use twice daily for up to four days  gently flush ear with water, using soft rubber bulb syringe after final treatment (on 4th day)    13 minutes spent testing and counseling on results.   Alfonse Alpers  Audiologist, Au.D., CCC-A 08/08/2022  11:37 AM  Cc: Erskine Emery, MD

## 2022-08-16 ENCOUNTER — Other Ambulatory Visit: Payer: Self-pay

## 2022-08-16 DIAGNOSIS — Z794 Long term (current) use of insulin: Secondary | ICD-10-CM

## 2022-08-16 MED ORDER — LANTUS SOLOSTAR 100 UNIT/ML ~~LOC~~ SOPN: 18.0000 [IU] | PEN_INJECTOR | Freq: Every day | SUBCUTANEOUS | 2 refills | Status: DC

## 2022-08-21 ENCOUNTER — Other Ambulatory Visit: Payer: Self-pay | Admitting: Family Medicine

## 2022-08-21 DIAGNOSIS — Z794 Long term (current) use of insulin: Secondary | ICD-10-CM

## 2022-08-21 DIAGNOSIS — E1100 Type 2 diabetes mellitus with hyperosmolarity without nonketotic hyperglycemic-hyperosmolar coma (NKHHC): Secondary | ICD-10-CM

## 2022-08-21 MED ORDER — ACCU-CHEK SOFTCLIX LANCETS MISC: 1 refills | Status: AC

## 2022-08-23 ENCOUNTER — Other Ambulatory Visit: Payer: Self-pay

## 2022-08-23 DIAGNOSIS — E119 Type 2 diabetes mellitus without complications: Secondary | ICD-10-CM

## 2022-08-23 MED ORDER — GLUCOSE BLOOD VI STRP: 1.0000 | ORAL_STRIP | Freq: Three times a day (TID) | 12 refills | Status: DC

## 2022-08-24 ENCOUNTER — Ambulatory Visit (INDEPENDENT_AMBULATORY_CARE_PROVIDER_SITE_OTHER): Payer: Medicare HMO | Admitting: Student

## 2022-08-24 VITALS — BP 100/64 | HR 99 | Temp 97.9°F | Wt 109.4 lb

## 2022-08-24 DIAGNOSIS — J00 Acute nasopharyngitis [common cold]: Secondary | ICD-10-CM | POA: Diagnosis not present

## 2022-08-24 MED ORDER — CETIRIZINE HCL 10 MG PO TABS: 10.0000 mg | ORAL_TABLET | Freq: Every day | ORAL | 11 refills | Status: AC | PRN

## 2022-08-24 MED ORDER — BLOOD GLUCOSE MONITOR KIT: PACK | 0 refills | Status: DC

## 2022-08-24 NOTE — Patient Instructions (Signed)
It was great to see you! Thank you for allowing me to participate in your care!  It looks like you may be suffering from allergies or a virus that is causing your nose to drain a lot of mucous down your throat. We will try a medication and rinse to help with that.   Our plans for today:  - Ear Flushing - Zyrtec for secretions - Nasal rinse to help clean out/clear out secretions, use daily  Take care and seek immediate care sooner if you develop any concerns.   Dr. Holley Bouche, MD [place]

## 2022-08-24 NOTE — Assessment & Plan Note (Addendum)
Patient presents with weakness on episode of increased phlegm and nasal congestion throat, that is hard to swallow.  Patient reports history of seasonal allergies but is not currently taking any meds for them.  Patient denies any systemic symptoms fever, body aches, pain or pressure in the face, but does note decreased appetite following issues with swallowing mucus.  Patient symptoms are most concerning for viral URI causing acute rhinitis/postnasal drip, however patient could also be suffering from allergy flare. Less concerned for sinus infection given symptoms and hx.  Will recommend nasal rinse and antihistamine. -Zyrtec 10 mg daily -Nasal rinse

## 2022-08-24 NOTE — Progress Notes (Signed)
  SUBJECTIVE:   CHIEF COMPLAINT / HPI:   Flush Ears Patient needing ears flushed of cerumen, found to have impaction in left ear, needed removed for next audiology visit 08/29/22. Patient suggested to use debrox in ears. Audiology testing for hearing loss.  Patient continues to have wax in left ear today.    Flem Reports a lot of flem coming up and sore throat with a headache, all starting around last Wednesday. Finding it difficult to swallow all of mucous. Denies any fever or body aches, or pain/pressure in face. Has had decreased appetite do to issues with swallowing mucous.   PERTINENT  PMH / PSH:     OBJECTIVE:  BP 100/64   Pulse 99   Temp 97.9 F (36.6 C)   Wt 109 lb 6.4 oz (49.6 kg)   SpO2 99%   BMI 20.67 kg/m  Physical Exam HENT:     Right Ear: Tympanic membrane normal.     Left Ear: No drainage, swelling or tenderness. A middle ear effusion is present. Tympanic membrane is not erythematous.     Nose: Congestion and rhinorrhea present.     Mouth/Throat:     Mouth: Mucous membranes are moist.     Comments: Copious amount of mucous in oropharynx Neurological:     Mental Status: She is alert.      ASSESSMENT/PLAN:  Acute rhinitis Assessment & Plan: Patient presents with weakness on episode of increased phlegm and nasal congestion throat, that is hard to swallow.  Patient reports history of seasonal allergies but is not currently taking any meds for them.  Patient denies any systemic symptoms fever, body aches, pain or pressure in the face, but does note decreased appetite following issues with swallowing mucus.  Patient symptoms are most concerning for viral URI causing acute rhinitis/postnasal drip, however patient could also be suffering from allergy flare. Less concerned for sinus infection given symptoms and hx.  Will recommend nasal rinse and antihistamine. -Zyrtec 10 mg daily -Nasal rinse   Other orders -     Cetirizine HCl; Take 1 tablet (10 mg total) by  mouth daily as needed for allergies.  Dispense: 30 tablet; Refill: 11 -     blood glucose meter kit and supplies; Dispense based on patient and insurance preference. Use up to four times daily as directed.  Dispense: 1 each; Refill: 0  Ear Flush Patient left ear flushed and wax removed. TM clearly visible. Patient should be all set for audiology appointment.    No follow-ups on file. Holley Bouche, MD 08/24/2022, 1:03 PM PGY-2, [place]

## 2022-08-29 ENCOUNTER — Ambulatory Visit: Payer: Medicare HMO | Attending: Audiologist | Admitting: Audiologist

## 2022-08-29 DIAGNOSIS — K148 Other diseases of tongue: Secondary | ICD-10-CM | POA: Diagnosis present

## 2022-08-29 DIAGNOSIS — Z794 Long term (current) use of insulin: Secondary | ICD-10-CM | POA: Diagnosis not present

## 2022-08-29 DIAGNOSIS — Z833 Family history of diabetes mellitus: Secondary | ICD-10-CM | POA: Diagnosis not present

## 2022-08-29 DIAGNOSIS — E1122 Type 2 diabetes mellitus with diabetic chronic kidney disease: Secondary | ICD-10-CM | POA: Diagnosis present

## 2022-08-29 DIAGNOSIS — R600 Localized edema: Secondary | ICD-10-CM | POA: Diagnosis not present

## 2022-08-29 DIAGNOSIS — Z6820 Body mass index (BMI) 20.0-20.9, adult: Secondary | ICD-10-CM | POA: Diagnosis not present

## 2022-08-29 DIAGNOSIS — R1311 Dysphagia, oral phase: Secondary | ICD-10-CM | POA: Diagnosis present

## 2022-08-29 DIAGNOSIS — Z79899 Other long term (current) drug therapy: Secondary | ICD-10-CM | POA: Diagnosis not present

## 2022-08-29 DIAGNOSIS — H903 Sensorineural hearing loss, bilateral: Secondary | ICD-10-CM

## 2022-08-29 DIAGNOSIS — Z8249 Family history of ischemic heart disease and other diseases of the circulatory system: Secondary | ICD-10-CM | POA: Diagnosis not present

## 2022-08-29 DIAGNOSIS — Z931 Gastrostomy status: Secondary | ICD-10-CM | POA: Diagnosis not present

## 2022-08-29 DIAGNOSIS — E43 Unspecified severe protein-calorie malnutrition: Secondary | ICD-10-CM | POA: Diagnosis present

## 2022-08-29 DIAGNOSIS — I6503 Occlusion and stenosis of bilateral vertebral arteries: Secondary | ICD-10-CM | POA: Diagnosis not present

## 2022-08-29 DIAGNOSIS — Z4659 Encounter for fitting and adjustment of other gastrointestinal appliance and device: Secondary | ICD-10-CM | POA: Diagnosis not present

## 2022-08-29 DIAGNOSIS — I6782 Cerebral ischemia: Secondary | ICD-10-CM | POA: Diagnosis not present

## 2022-08-29 DIAGNOSIS — G523 Disorders of hypoglossal nerve: Secondary | ICD-10-CM | POA: Diagnosis not present

## 2022-08-29 DIAGNOSIS — K149 Disease of tongue, unspecified: Secondary | ICD-10-CM | POA: Diagnosis present

## 2022-08-29 DIAGNOSIS — N1831 Chronic kidney disease, stage 3a: Secondary | ICD-10-CM | POA: Diagnosis present

## 2022-08-29 DIAGNOSIS — Z809 Family history of malignant neoplasm, unspecified: Secondary | ICD-10-CM | POA: Diagnosis not present

## 2022-08-29 DIAGNOSIS — I129 Hypertensive chronic kidney disease with stage 1 through stage 4 chronic kidney disease, or unspecified chronic kidney disease: Secondary | ICD-10-CM | POA: Diagnosis present

## 2022-08-29 DIAGNOSIS — D3705 Neoplasm of uncertain behavior of pharynx: Secondary | ICD-10-CM | POA: Diagnosis not present

## 2022-08-29 DIAGNOSIS — M852 Hyperostosis of skull: Secondary | ICD-10-CM | POA: Diagnosis not present

## 2022-08-29 DIAGNOSIS — E785 Hyperlipidemia, unspecified: Secondary | ICD-10-CM | POA: Diagnosis present

## 2022-08-29 DIAGNOSIS — R1312 Dysphagia, oropharyngeal phase: Secondary | ICD-10-CM | POA: Diagnosis not present

## 2022-08-29 DIAGNOSIS — R0989 Other specified symptoms and signs involving the circulatory and respiratory systems: Secondary | ICD-10-CM | POA: Diagnosis not present

## 2022-08-29 DIAGNOSIS — Z7984 Long term (current) use of oral hypoglycemic drugs: Secondary | ICD-10-CM | POA: Diagnosis not present

## 2022-08-29 DIAGNOSIS — T17320A Food in larynx causing asphyxiation, initial encounter: Secondary | ICD-10-CM | POA: Diagnosis not present

## 2022-08-29 DIAGNOSIS — Z01818 Encounter for other preprocedural examination: Secondary | ICD-10-CM | POA: Diagnosis not present

## 2022-08-29 DIAGNOSIS — R1310 Dysphagia, unspecified: Secondary | ICD-10-CM | POA: Diagnosis not present

## 2022-08-29 DIAGNOSIS — N179 Acute kidney failure, unspecified: Secondary | ICD-10-CM | POA: Diagnosis present

## 2022-08-29 DIAGNOSIS — M47812 Spondylosis without myelopathy or radiculopathy, cervical region: Secondary | ICD-10-CM | POA: Diagnosis not present

## 2022-08-29 DIAGNOSIS — E860 Dehydration: Secondary | ICD-10-CM | POA: Diagnosis present

## 2022-08-29 NOTE — Procedures (Signed)
  Outpatient Audiology and [address] [place], [place]  [id number] [phone]  AUDIOLOGICAL  EVALUATION  NAME: Sara [name]     DOB:   20-Oct-1941      MRN: [id number]                                                                                     DATE: 08/29/2022     REFERENT: Erskine Emery, MD STATUS: Outpatient DIAGNOSIS: Asymmetric Sensorineural Hearing Loss     History: [name] was seen for an audiological evaluation. [name] was accompanied to the appointment by her son and great grandson. [name] is receiving a hearing evaluation due to concerns for difficulty hearing from the right ear only. She hears well from her left. This difficulty began suddenly a while ago, her son says about three months ago. No pain or pressure reported in either ear. Tinnitus was loud in the right ear but has stopped. [name] had wax flushed 08/24/2022 by her PCP. [name] has no history of stroke or right sided head injury. Medical history positive for diabetes which is a risk factor for hearing loss. No other relevant case history reported.   Evaluation:  Otoscopy showed a clear view of the tympanic membranes, bilaterally Tympanometry results were consistent with normal middle ear function, bilaterally   Audiometric testing was completed using conventional audiometry with insert transducer. Speech Recognition Thresholds were  85dB in the right ear and  35dB in the left ear. Word Recognition was 32% in the right ear at 105dB with masking and 80% in the left ear at 75dB. Bilateral WRS 93% at 75dB showing no binaural interference. Pure tone thresholds shows moderately severe flat sensorineural hearing loss in the right ear with slight conductive component 250-1kHz. and normal sloping to moderately severe sensorineural  hearing loss in the left ear.   Results:  The test results were reviewed with [name] and her son. [name] needs to be seen by Otolaryngology due  to the asymmetric hearing loss. They reported understanding and were given the audiogram to bring to the appointment. Recommend hearing aid consult as well after medical evaluation.       Recommendations: Amplification is necessary for both ears. Hearing aids can be purchased from a variety of locations. See provided list for locations in the [place] area.  Referral to ENT Physician necessary due to asymmetric hearing loss in right ear.    43 minutes spent testing and counseling on results.   Alfonse Alpers  Audiologist, Au.D., CCC-A 08/29/2022  11:46 AM  Cc: Erskine Emery, MD

## 2022-08-30 ENCOUNTER — Encounter (HOSPITAL_BASED_OUTPATIENT_CLINIC_OR_DEPARTMENT_OTHER): Payer: Self-pay | Admitting: Emergency Medicine

## 2022-08-30 ENCOUNTER — Encounter (HOSPITAL_COMMUNITY): Payer: Self-pay

## 2022-08-30 ENCOUNTER — Inpatient Hospital Stay (HOSPITAL_BASED_OUTPATIENT_CLINIC_OR_DEPARTMENT_OTHER)
Admission: EM | Admit: 2022-08-30 | Discharge: 2022-09-10 | DRG: 391 | Disposition: A | Discharge status: Home Health | Payer: Medicare HMO | Attending: Family Medicine | Admitting: Family Medicine

## 2022-08-30 ENCOUNTER — Emergency Department (HOSPITAL_BASED_OUTPATIENT_CLINIC_OR_DEPARTMENT_OTHER): Payer: Medicare HMO

## 2022-08-30 ENCOUNTER — Other Ambulatory Visit: Payer: Self-pay | Admitting: Student

## 2022-08-30 ENCOUNTER — Other Ambulatory Visit: Payer: Self-pay

## 2022-08-30 DIAGNOSIS — H9193 Unspecified hearing loss, bilateral: Secondary | ICD-10-CM

## 2022-08-30 DIAGNOSIS — Z8249 Family history of ischemic heart disease and other diseases of the circulatory system: Secondary | ICD-10-CM

## 2022-08-30 DIAGNOSIS — R600 Localized edema: Secondary | ICD-10-CM | POA: Diagnosis not present

## 2022-08-30 DIAGNOSIS — Z794 Long term (current) use of insulin: Secondary | ICD-10-CM

## 2022-08-30 DIAGNOSIS — D3705 Neoplasm of uncertain behavior of pharynx: Secondary | ICD-10-CM | POA: Diagnosis not present

## 2022-08-30 DIAGNOSIS — M47812 Spondylosis without myelopathy or radiculopathy, cervical region: Secondary | ICD-10-CM | POA: Diagnosis not present

## 2022-08-30 DIAGNOSIS — E785 Hyperlipidemia, unspecified: Secondary | ICD-10-CM | POA: Diagnosis present

## 2022-08-30 DIAGNOSIS — E860 Dehydration: Secondary | ICD-10-CM | POA: Diagnosis present

## 2022-08-30 DIAGNOSIS — E1122 Type 2 diabetes mellitus with diabetic chronic kidney disease: Secondary | ICD-10-CM | POA: Diagnosis present

## 2022-08-30 DIAGNOSIS — E119 Type 2 diabetes mellitus without complications: Secondary | ICD-10-CM

## 2022-08-30 DIAGNOSIS — Z79899 Other long term (current) drug therapy: Secondary | ICD-10-CM | POA: Diagnosis not present

## 2022-08-30 DIAGNOSIS — Z6820 Body mass index (BMI) 20.0-20.9, adult: Secondary | ICD-10-CM

## 2022-08-30 DIAGNOSIS — I10 Essential (primary) hypertension: Secondary | ICD-10-CM | POA: Diagnosis present

## 2022-08-30 DIAGNOSIS — I6503 Occlusion and stenosis of bilateral vertebral arteries: Secondary | ICD-10-CM | POA: Diagnosis not present

## 2022-08-30 DIAGNOSIS — N179 Acute kidney failure, unspecified: Secondary | ICD-10-CM | POA: Diagnosis present

## 2022-08-30 DIAGNOSIS — I6782 Cerebral ischemia: Secondary | ICD-10-CM | POA: Diagnosis not present

## 2022-08-30 DIAGNOSIS — K149 Disease of tongue, unspecified: Secondary | ICD-10-CM | POA: Diagnosis present

## 2022-08-30 DIAGNOSIS — Z809 Family history of malignant neoplasm, unspecified: Secondary | ICD-10-CM | POA: Diagnosis not present

## 2022-08-30 DIAGNOSIS — Z833 Family history of diabetes mellitus: Secondary | ICD-10-CM | POA: Diagnosis not present

## 2022-08-30 DIAGNOSIS — Z01818 Encounter for other preprocedural examination: Secondary | ICD-10-CM | POA: Diagnosis not present

## 2022-08-30 DIAGNOSIS — Z7984 Long term (current) use of oral hypoglycemic drugs: Secondary | ICD-10-CM

## 2022-08-30 DIAGNOSIS — I129 Hypertensive chronic kidney disease with stage 1 through stage 4 chronic kidney disease, or unspecified chronic kidney disease: Secondary | ICD-10-CM | POA: Diagnosis present

## 2022-08-30 DIAGNOSIS — R1311 Dysphagia, oral phase: Principal | ICD-10-CM | POA: Diagnosis present

## 2022-08-30 DIAGNOSIS — K148 Other diseases of tongue: Secondary | ICD-10-CM | POA: Diagnosis present

## 2022-08-30 DIAGNOSIS — E1159 Type 2 diabetes mellitus with other circulatory complications: Secondary | ICD-10-CM | POA: Diagnosis present

## 2022-08-30 DIAGNOSIS — G523 Disorders of hypoglossal nerve: Secondary | ICD-10-CM | POA: Diagnosis not present

## 2022-08-30 DIAGNOSIS — Z4659 Encounter for fitting and adjustment of other gastrointestinal appliance and device: Secondary | ICD-10-CM | POA: Diagnosis not present

## 2022-08-30 DIAGNOSIS — R0989 Other specified symptoms and signs involving the circulatory and respiratory systems: Secondary | ICD-10-CM | POA: Diagnosis not present

## 2022-08-30 DIAGNOSIS — R1310 Dysphagia, unspecified: Secondary | ICD-10-CM | POA: Diagnosis not present

## 2022-08-30 DIAGNOSIS — M852 Hyperostosis of skull: Secondary | ICD-10-CM | POA: Diagnosis not present

## 2022-08-30 DIAGNOSIS — N1831 Chronic kidney disease, stage 3a: Secondary | ICD-10-CM | POA: Diagnosis present

## 2022-08-30 DIAGNOSIS — E43 Unspecified severe protein-calorie malnutrition: Secondary | ICD-10-CM | POA: Diagnosis present

## 2022-08-30 DIAGNOSIS — T17320A Food in larynx causing asphyxiation, initial encounter: Secondary | ICD-10-CM | POA: Diagnosis not present

## 2022-08-30 DIAGNOSIS — R1312 Dysphagia, oropharyngeal phase: Secondary | ICD-10-CM | POA: Diagnosis not present

## 2022-08-30 DIAGNOSIS — G5793 Unspecified mononeuropathy of bilateral lower limbs: Secondary | ICD-10-CM

## 2022-08-30 LAB — CBC
HCT: 42.4 % (ref 36.0–46.0)
Hemoglobin: 13.5 g/dL (ref 12.0–15.0)
MCH: 26.4 pg (ref 26.0–34.0)
MCHC: 31.8 g/dL (ref 30.0–36.0)
MCV: 83 fL (ref 80.0–100.0)
Platelets: 315 10*3/uL (ref 150–400)
RBC: 5.11 MIL/uL (ref 3.87–5.11)
RDW: 15.1 % (ref 11.5–15.5)
WBC: 6.5 10*3/uL (ref 4.0–10.5)
nRBC: 0 % (ref 0.0–0.2)

## 2022-08-30 LAB — GLUCOSE, CAPILLARY
Glucose-Capillary: 105 mg/dL — ABNORMAL HIGH (ref 70–99)
Glucose-Capillary: 93 mg/dL (ref 70–99)

## 2022-08-30 LAB — COMPREHENSIVE METABOLIC PANEL
ALT: 25 U/L (ref 0–44)
AST: 29 U/L (ref 15–41)
Albumin: 4.4 g/dL (ref 3.5–5.0)
Alkaline Phosphatase: 96 U/L (ref 38–126)
Anion gap: 9 (ref 5–15)
BUN: 84 mg/dL — ABNORMAL HIGH (ref 8–23)
CO2: 24 mmol/L (ref 22–32)
Calcium: 10.4 mg/dL — ABNORMAL HIGH (ref 8.9–10.3)
Chloride: 104 mmol/L (ref 98–111)
Creatinine, Ser: 2.13 mg/dL — ABNORMAL HIGH (ref 0.44–1.00)
GFR, Estimated: 23 mL/min — ABNORMAL LOW (ref >60)
Glucose, Bld: 156 mg/dL — ABNORMAL HIGH (ref 70–99)
Potassium: 4.1 mmol/L (ref 3.5–5.1)
Sodium: 137 mmol/L (ref 135–145)
Total Bilirubin: 0.9 mg/dL (ref 0.3–1.2)
Total Protein: 9 g/dL — ABNORMAL HIGH (ref 6.5–8.1)

## 2022-08-30 MED ORDER — LACTATED RINGERS IV BOLUS
1000.0000 mL | Freq: Once | INTRAVENOUS | Status: AC
  Administered 2022-08-30: 1000 mL via INTRAVENOUS

## 2022-08-30 MED ORDER — SODIUM CHLORIDE 0.9 % IV SOLN: Freq: Once | INTRAVENOUS | Status: AC

## 2022-08-30 NOTE — Assessment & Plan Note (Addendum)
Holding home meds: metformin, insulin (22 Units daily), and invokana.  - CBGs q6h  - Cont SSI, sensitive

## 2022-08-30 NOTE — ED Notes (Signed)
Pt up to Br to void  ambulatory with assist

## 2022-08-30 NOTE — Progress Notes (Signed)
Pt was previously seen for hearing loss.  Her CT reviewed. Questionable tongue mass. No airway obstruction. Recommend MRI of the neck for further evaluation. Plan to see the patient after her MRI scan.

## 2022-08-30 NOTE — Hospital Course (Addendum)
Sara [name] is a 81 y.o. female who presented with progressive dysphagia and AKI who was found to have no mass but abnormal swelling. PMH significant for CKD 3a, T2DM, and HTN. A brief hospital course is below.   Oral phase dysphagia  Tongue mass Presented for difficulty swallowing and 11 pound weight loss in 1 month.  She reports that she had this irregular mass by about 1 month ago.  CT head showed tiny asymmetry on the right.  ENT consulted and recommended MRI which showed asymmetric enlargement and edema involving respite of the oral tongue without discrete mass.  MRI brain showed no discrete stroke lesions.  Neurology was consulted and suspected this was due to a complication of her longstanding type 2 diabetes.  SLP obtained a swallowing function study which showed difficulty swallowing and high risk for aspiration. Family and patient agreed to PEG tube placement while she works with SLP outpatient to regain swallowing function.  PEG was placed 11/8. Patient tolerated tube feeds well and discharged home with outpatient follow up with SLP and neurology.    AKI (acute kidney injury) ([place]) Creatinine 2.13 on admission, but improved with IV fluids.  Creatinine down to 0.73 by d/c/  HTN (hypertension) Hypertensive on admission to 171/86.  Restarted home losartan with good control of blood pressures.  Issues for follow up: 5.5 mm sub solid nodule left upper lobe. Recommend complete chest CT with contrast outpatient.  Restart canagliflozin in outpatient Metformin increased to 1000 mg BID at d/c, will need adjustment in outpatient Ensure patient follows up with neurology outpatient.  Losartan held, reassess and make adjustments as appropriate.  Held Invokana due to preventing hypoglycemia and adjusting to new tube feeds, reassess and restart as appropriate.

## 2022-08-30 NOTE — Progress Notes (Signed)
Received a call from Dr. Sharlett Iles regarding Mrs. Sara [name], who came in with progressive dysphagia, weight loss and dehydration.  She was found to have a possible tongue mass which is likely to cause her symptoms.  She also has acute kidney injury due to poor fluid intake.  He spoke with Dr. Redmond Baseman from ENT and he will consult upon her arrival at [facility].    She is a patient established with family medicine teaching service, spoke with the resident on-call, Dr. Adah Salvage, who added the patient on their list and will do the admission upon arrival.  While in the ED, care is provided by the ED physician, please page the family medicine resident upon arrival to [place]. Cruzita Lederer, MD, PhD [facility]

## 2022-08-30 NOTE — Assessment & Plan Note (Addendum)
BP stable overnight. If pressures continue to increase throughout the day can start back BP meds tomorrow per tube.  - Monitor BP  - hold BP meds in the setting of low pressures

## 2022-08-30 NOTE — ED Notes (Signed)
[facility] here to get pt , daughter with her

## 2022-08-30 NOTE — Progress Notes (Signed)
ENT referral placed at request of audiology.

## 2022-08-30 NOTE — ED Triage Notes (Signed)
Pt reports difficulty swallowing due to excess phlegm x 2 weeks. Phlegm is white/clear and foamy. Seen at PCP and given zyrtec. Reports no relief. Pt states she feels like something is stuck in her throat. Difficulty with po intake.

## 2022-08-30 NOTE — Progress Notes (Signed)
Pt arrived to 2W31 per [facility]. Pt oriented to 2W, focused assessment completed, vitals obtained, handoff given to oncoming shift. No complications noted.  Sheela Stack, LPN

## 2022-08-30 NOTE — Plan of Care (Signed)
  Problem: Activity: Goal: Risk for activity intolerance will decrease Outcome: Progressing   Problem: Nutrition: Goal: Adequate nutrition will be maintained Outcome: Progressing   Problem: Coping: Goal: Level of anxiety will decrease Outcome: Progressing   Problem: Safety: Goal: Ability to remain free from injury will improve Outcome: Progressing   Problem: Skin Integrity: Goal: Risk for impaired skin integrity will decrease Outcome: Progressing   

## 2022-08-30 NOTE — Assessment & Plan Note (Deleted)
Resolved.  Creatinine back to baseline. - Continue IV maintenance fluids, due to poor p.o. intake

## 2022-08-30 NOTE — H&P (Addendum)
Hospital Admission History and Physical Service Pager: [phone]  Patient name: Sara [name] Medical record number: [id number] Date of Birth: 05-17-1941 Age: 81 y.o. Gender: female  Primary Care Provider: Erskine Emery, MD Consultants: ENT Code Status: Full Preferred Emergency Contact:   Name Relation Home Work [place] Daughter [phone]     [facility] Daughter   [phone]   Chief Complaint: Dysphagia  Assessment and Plan: Sara [name] is a 81 y.o. female presenting with progressive dysphagia, weight loss, and AKI. Differential for this patient's presentation of this includes malignancy (more likely given CT findings, dysphagia, and reported weight loss), lingual abscess (less likely given no infectious symptoms, erythema on exam, or leukocytosis), and acute CVA (less likely given no new neurological deficits and findings of tongue pathology on imaging).  * Tongue mass VSS. Noted on CT head with progressive dysphagia and copious oral secretions. Patient has only been able to take liquids. Has had 11lb weight loss in the last month per chart review. She has not had difficulty breathing and is protecting her airway. ENT consulted in ED for mass and plan to obtain MRI and evaluate afterwards. - Admit to [facility] med-surg attending Dr. Erin Hearing given need for further workup  - ENT following, appreciate recs - F/u MRI brain - Npo for now given dysphagia, consider SLP evaluation - Vitals per unit routine  AKI (acute kidney injury) ([place]) Creatinine 2.13 on admission, baseline around 1. Likely in the setting of dysphagia with minimal oral intake.  - Continue IVF with NS '@100mL'$ /hr - Monitor with BMP  HTN (hypertension) Hypertensive on admission to 171/86, patient unable to take oral medications at this time. Will continue to closely monitor given current AKI and will administer medications via IV if concerning Bps. - Monitor BP closely  - Consider IV  antihypertensives if BP >220/110 - Continue IVF  Type 2 diabetes mellitus without complication, with long-term current use of insulin (HCC) Current home meds include metformin, insulin (22 Units daily), and invokana. Has not taken insulin other than 1 time due to decreased oral intake and now has AKI. Given the inability to take oral, will hold off on insulin but will monitor CBGs.  - CBGs q6h  - Consider very sensitive SSI if CBGs severely elevated - Holding home metformin, invokana, and insulin   Chronic conditions HLD- holding atorvastatin due to dysphagia   FEN/GI: NPO given dysphagia VTE Prophylaxis: SCDs  Disposition: med-surg  History of Present Illness:  APPOLONIA [name] is a 81 y.o. female presenting with progressive dysphagia, weight loss, and dehydration.   Patient presented to [facility] ER due to difficulty swallowing and feeling like solids and liquids are getting stuck in the back of her oropharynx for the last week. She has only been able to tolerate small amounts of broth and has lost approximately 10lbs in the last month.  Patient reports that she is not having pain. She is able to swallow some liquids down like cream of chicken, but nothing else. She has never had this happen before and no known family history. Has not had a bowel movement since not being able to eat. She has been drinking as much fluids as she can, but it is still not very much. Reports urinating well though it is dark, denies any discomfort with urinating,  Takes insulin daily, but has only taken once since she started having issues with her swallowing with decreased oral intake.  In the ED, patient's labs  were consistent with an AKI due to poor oral intake and with progressive dysphagia. Patient was given IVF with 1L bolus followed by NS '@100mL'$ /hr. ENT was consulted and Dr. Redmond Baseman recommended admission to [facility]. Patient is followed by Dr. Benjamine Mola, who will see the patient for consult.     Review Of Systems: Per HPI with the following additions:  Denies: fevers, chest pain, shortness of breath, abdominal pain. Does report some mild nausea and vomiting after doing [place] on Friday.  Pertinent Past Medical History: CKD 3A T2DM HTN Remainder reviewed in history tab.   Pertinent Past Surgical History: Eye surgery - cataracts Remainder reviewed in history tab.   Pertinent Social History: Tobacco use: No Alcohol use: No Other Substance use: No Lives with her daughter  Pertinent Family History: Mother- diabetes and heart disease Father- Alzheimer's Siblings - cancer (pancreatic, prostate) and alzheimer's  Remainder reviewed in history tab.   Important Outpatient Medications: Atorvastatin '40mg'$  daily Canagliflozin '300mg'$  daily Zyrtec '10mg'$  daily Gabapentin '100mg'$  QHS Lantus 18 Units daily Losartan '50mg'$  daily Metformin '1000mg'$  BID Remainder reviewed in medication history.   Objective: BP (!) 171/86 (BP Location: Right Arm)   Pulse 100   Temp 98.2 F (36.8 C) (Oral)   Resp 18   Ht '5\' 1"'$  (1.549 m)   Wt 49.4 kg   SpO2 98%   BMI 20.60 kg/m  Exam:  General -- alert and oriented, pleasant and cooperative. HEENT -- Head is normocephalic. EOMI grossly normal. Oropharynx with copious amounts of mucus, tongue appears pale and larger on R side with deviation toward the left Neck -- supple; no bruits or LAD Chest -- good expansion. Lungs clear to auscultation anteriorly. Cardiac -- RRR. No murmurs noted.  Abdomen -- soft, nontender. No masses palpable. Bowel sounds present. Genital, rectal and breast exam -- deferred. CNS -- cranial nerves II through XII grossly intact. Extremities - no tenderness or effusions noted. ROM good in bed  Labs:  CBC BMET  Recent Labs  Lab 08/30/22 1336  WBC 6.5  HGB 13.5  HCT 42.4  PLT 315   Recent Labs  Lab 08/30/22 1336  NA 137  K 4.1  CL 104  CO2 24  BUN 84*  CREATININE 2.13*  GLUCOSE 156*  CALCIUM 10.4*      Imaging Studies Performed:  CT Soft Tissue Neck Wo Contrast Result Date: 08/30/2022 IMPRESSION: 1. Tongue asymmetry. Tongue volume is greater in the right tongue base than the left tongue base. Question prior surgery. Possible atrophy of the posterior tongue on the left versus enlargement of the right tongue base. Lack of intravenous contrast limits evaluation for mucosal lesion. Direct mucosal evaluation recommended to exclude mass lesion. 2. No enlarged lymph nodes in the neck 3. 5.5 mm sub solid nodule left upper lobe. Recommend complete chest CT with contrast. Electronically Signed   By: Franchot Gallo M.D.   On: 08/30/2022 16:10   DG Chest 2 View Result Date: 08/30/2022 IMPRESSION: No active cardiopulmonary disease. Electronically Signed   By: Marijo Conception M.D.   On: 08/30/2022 13:46     Jacelyn Grip, MD 08/30/2022, 9:06 PM PGY-1, [place] Intern pager: [phone], text pages welcome Secure chat group [place] Upper-Level Resident Addendum   I have independently interviewed and examined the patient. I have discussed the above with the original author and agree with their documentation. My edits for correction/addition/clarification are in within the  document. Please see also any attending notes.   Rise Patience, DO  PGY-3, [facility] 08/30/2022 10:20 PM  FPTS Service pager: [phone] (text pages welcome through [facility])

## 2022-08-30 NOTE — Assessment & Plan Note (Deleted)
Noted on CT head with progressive dysphagia and copious oral secretions. Patient has only been able to take liquids. Has had 11lb weight loss in the last month. She has not had difficulty breathing and is protecting her airway. ENT consulted in ED for mass and plan to obtain MRI and evaluate afterwards. - ENT following, appreciate recs - F/u MRI brain - Npo for now given dysphagia, consider SLP evaluation - Vitals per unit routine - increase IV maintenance fluids to 150 ml/hr for dehydration

## 2022-08-30 NOTE — ED Provider Notes (Signed)
[facility] EMERGENCY DEPARTMENT Provider Note   CSN: [id number] Arrival date & time: 08/30/22  1305     History  Chief Complaint  Patient presents with   Dysphagia    Sara [name] is a 81 y.o. female.  81 year old female with a history of hypertension, hyperlipidemia, and CKD who presents to the emergency department with difficulty swallowing.  Patient states that for the past week she has had difficulty swallowing both solids and liquids.  Says that she feels like it gets stuck in the back of her oropharynx.  Says that she has only been able to tolerate small amounts of broth and has lost approximately 20 pounds in that time.  Denies any pain in her neck.  No fevers.  No double vision.  Says that she has had voice changes but denies any shortness of breath.  Denies recalling swallowing anything got stuck in her throat.  No diplopia or dizziness.  No history of smoking or cancer.  No history of stroke.       Home Medications Prior to Admission medications   Medication Sig Start Date End Date Taking? Authorizing Provider  atorvastatin (LIPITOR) 40 MG tablet Take 1 tablet (40 mg total) by mouth daily. 03/05/22  Yes Brimage, Ronnette Juniper, DO  canagliflozin (INVOKANA) 300 MG TABS tablet Take 1 tablet (300 mg total) by mouth daily before breakfast. 03/05/22  Yes Brimage, Vondra, DO  cetirizine (ZYRTEC) 10 MG tablet Take 1 tablet (10 mg total) by mouth daily as needed for allergies. 08/24/22  Yes Sowell, Erlene Quan, MD  diclofenac Sodium (VOLTAREN) 1 % GEL Apply 2 g topically 4 (four) times daily. Patient taking differently: Apply 2 g topically daily as needed (For pain). 04/06/21  Yes Milus Banister C, DO  gabapentin (NEURONTIN) 100 MG capsule Take 1 capsule (100 mg total) by mouth at bedtime. 10/05/21  Yes Brimage, Vondra, DO  insulin glargine (LANTUS SOLOSTAR) 100 UNIT/ML Solostar Pen Inject 18 Units into the skin daily. Patient taking differently: Inject 22 Units into the skin daily.  08/16/22  Yes Erskine Emery, MD  losartan (COZAAR) 50 MG tablet Take 1 tablet (50 mg total) by mouth at bedtime. 07/30/22  Yes Erskine Emery, MD  metFORMIN (GLUCOPHAGE) 500 MG tablet TAKE 2 TABLETS BY MOUTH TWICE DAILY WITH A MEAL Patient taking differently: Take 500 mg by mouth 2 (two) times daily with a meal. 08/21/22  Yes Maxwell, Allee, MD  Accu-Chek Softclix Lancets lancets Please use to check blood sugar up to 4 times daily. 08/21/22   Erskine Emery, MD  blood glucose meter kit and supplies KIT Dispense based on patient and insurance preference. Use up to four times daily as directed. 08/24/22   Holley Bouche, MD  glucose blood test strip 1 each by Other route 4 (four) times daily - after meals and at bedtime. 08/23/22   Erskine Emery, MD  Insulin Pen Needle (PEN NEEDLES 3/16") 31G X 5 MM MISC Use as instructed to inject insulin once daily 10/05/21   Lyndee Hensen, DO      Allergies    Patient has no known allergies.    Review of Systems   Review of Systems  Physical Exam Updated Vital Signs BP 122/61 (BP Location: Right Arm)   Pulse 95   Temp 98.4 F (36.9 C) (Oral)   Resp 18   Ht _0  (1.549 m)   Wt 49.4 kg   SpO2 100%   BMI 20.60 kg/m  Physical Exam Vitals and nursing note reviewed.  Constitutional:      General: She is not in acute distress.    Appearance: She is well-developed.     Comments: In no acute distress.  Spitting frequently into emesis basin during exam.  HENT:     Head: Normocephalic and atraumatic.     Right Ear: External ear normal.     Left Ear: External ear normal.     Nose: Nose normal.     Mouth/Throat:     Mouth: Mucous membranes are moist.     Pharynx: Oropharynx is clear. No oropharyngeal exudate or posterior oropharyngeal erythema.  Eyes:     Extraocular Movements: Extraocular movements intact.     Conjunctiva/sclera: Conjunctivae normal.     Pupils: Pupils are equal, round, and reactive to light.  Neck:     Comments: No masses  palpated Cardiovascular:     Rate and Rhythm: Normal rate and regular rhythm.     Heart sounds: No murmur heard. Pulmonary:     Effort: Pulmonary effort is normal. No respiratory distress.     Breath sounds: Normal breath sounds. No stridor.  Abdominal:     General: Abdomen is flat. There is no distension.     Palpations: Abdomen is soft. There is no mass.     Tenderness: There is no abdominal tenderness. There is no guarding.  Musculoskeletal:     Cervical back: Normal range of motion and neck supple. No rigidity or tenderness.  Lymphadenopathy:     Cervical: No cervical adenopathy.  Skin:    General: Skin is warm and dry.     Capillary Refill: Capillary refill takes less than 2 seconds.  Neurological:     Mental Status: She is alert and oriented to person, place, and time. Mental status is at baseline.  Psychiatric:        Mood and Affect: Mood normal.     ED Results / Procedures / Treatments   Labs (all labs ordered are listed, but only abnormal results are displayed) Labs Reviewed  COMPREHENSIVE METABOLIC PANEL - Abnormal; Notable for the following components:      Result Value   Glucose, Bld 156 (*)    BUN 84 (*)    Creatinine, Ser 2.13 (*)    Calcium 10.4 (*)    Total Protein 9.0 (*)    GFR, Estimated 23 (*)    All other components within normal limits  COMPREHENSIVE METABOLIC PANEL - Abnormal; Notable for the following components:   CO2 20 (*)    BUN 71 (*)    Creatinine, Ser 1.68 (*)    Calcium 10.8 (*)    GFR, Estimated 30 (*)    Anion gap 17 (*)    All other components within normal limits  GLUCOSE, CAPILLARY - Abnormal; Notable for the following components:   Glucose-Capillary 105 (*)    All other components within normal limits  CBC  CBC  GLUCOSE, CAPILLARY  GLUCOSE, CAPILLARY    EKG None  Radiology CT Soft Tissue Neck Wo Contrast  Result Date: 08/30/2022 CLINICAL DATA:  Dysphagia. EXAM: CT NECK WITHOUT CONTRAST TECHNIQUE: Multidetector CT  imaging of the neck was performed following the standard protocol without intravenous contrast. RADIATION DOSE REDUCTION: This exam was performed according to the departmental dose-optimization program which includes automated exposure control, adjustment of the mA and/or kV according to patient size and/or use of iterative reconstruction technique. COMPARISON:  None Available. FINDINGS: Pharynx and larynx: Asymmetry of the tongue. There is a larger volume of tissue in  the tongue on the right than the left particularly posteriorly at the tongue base. No definite mass in the area however mucosal evaluation limited without intravenous contrast. Remainder of the pharynx normal.  Larynx normal.  Airway intact. Salivary glands: No inflammation, mass, or stone. Thyroid: Normal Lymph nodes: No enlarged lymph nodes in the neck. Vascular: Limited vascular evaluation without intravenous contrast. Limited intracranial: Negative Visualized orbits: Negative Mastoids and visualized paranasal sinuses: Paranasal sinuses clear. Mastoid clear. Skeleton: Mild cervical spondylosis.  No acute skeletal abnormality. Upper chest: 5.5 mm sub solid nodule left upper lobe. Right upper lobe clear Other: None IMPRESSION: 1. Tongue asymmetry. Tongue volume is greater in the right tongue base than the left tongue base. Question prior surgery. Possible atrophy of the posterior tongue on the left versus enlargement of the right tongue base. Lack of intravenous contrast limits evaluation for mucosal lesion. Direct mucosal evaluation recommended to exclude mass lesion. 2. No enlarged lymph nodes in the neck 3. 5.5 mm sub solid nodule left upper lobe. Recommend complete chest CT with contrast. Electronically Signed   By: Franchot Gallo M.D.   On: 08/30/2022 16:10   DG Chest 2 View  Result Date: 08/30/2022 CLINICAL DATA:  Chest congestion. EXAM: CHEST - 2 VIEW COMPARISON:  None Available. FINDINGS: The heart size and mediastinal contours are within  normal limits. Both lungs are clear. The visualized skeletal structures are unremarkable. IMPRESSION: No active cardiopulmonary disease. Electronically Signed   By: Marijo Conception M.D.   On: 08/30/2022 13:46    Procedures Procedures   Medications Ordered in ED Medications  0.9 %  sodium chloride infusion ( Intravenous New Bag/Given 08/31/22 0924)  Chlorhexidine Gluconate Cloth 2 % PADS 6 each (0 each Topical Duplicate 85/6/31 4970)  lactated ringers bolus 1,000 mL (1,000 mLs Intravenous New Bag/Given 08/30/22 1534)  0.9 %  sodium chloride infusion (0 mLs Intravenous Stopped 08/30/22 2149)    ED Course/ Medical Decision Making/ A&P Clinical Course as of 08/31/22 1116  Thu Aug 30, 2022  1544 Dr Michail Sermon from GI was consulted.  Feels that since patient has AKI and is not tolerating secretions that she may need a scope tonight.  Recommends admission to [facility] where his colleague Dr Rosalie Gums may be able to perform endoscopy tonight.  [RP]  2637 CT Soft Tissue Neck Wo Contrast IMPRESSION: 1. Tongue asymmetry. Tongue volume is greater in the right tongue base than the left tongue base. Question prior surgery. Possible atrophy of the posterior tongue on the left versus enlargement of the right tongue base. Lack of intravenous contrast limits evaluation for mucosal lesion. Direct mucosal evaluation recommended to exclude mass lesion. 2. No enlarged lymph nodes in the neck 3. 5.5 mm sub solid nodule left upper lobe. Recommend complete chest CT with contrast. [RP]  1632 Per Dr Redmond Baseman from ENT requests that the patient be transferred to [facility] and will scope the patient either tonight or tomorrow morning.  [RP]  69 Dr Cruzita Lederer from hospitalist has accepted the patient. [RP]  [place] Per Dr Redmond Baseman will need to contact Dr Benjamine Mola since she has seen him previously. [RP]  1850 Dr Benjamine Mola from ENT will scope in the morning.  [RP]    Clinical Course User Index [RP] Fransico Meadow, MD                            Medical Decision Making Amount and/or Complexity of Data Reviewed Labs: ordered.  Radiology: ordered. Decision-making details documented in ED Course.  Risk Prescription drug management. Decision regarding hospitalization.   CARMYN HAMM is a 81 y.o. female with comorbidities that complicate the patient evaluation including hypertension, hyperlipidemia, and CKD who presents with chief complaint of 1 week of dysphagia and weight loss.  This patient presents to the ED for concern of complaints listed in HPI, this involves an extensive number of treatment options, and is a complaint that carries with it a high risk of complications and morbidity. Disposition including potential need for admission considered.   Initial Ddx:  Malignancy, neuromuscular disease, stroke, aspirated foreign body, infection  MDM:  Feel that patient may have neuromuscular disorder or stroke causing her symptoms given the acute onset of solid and liquid dysphagia that she is having.  If it were malignancy would expect a more insidious onset with solid initially.  Also does not have risk factors such as smoking or other cancer history.  Does not recall aspirating foreign body and does not have any readily identifiable foreign body on her exam at this time.  No infectious symptoms and states that her neck is not painful.  Plan:  Labs CT neck without contrast GI consult IV fluids  ED Summary/Re-evaluation:  Patient was reevaluated and was stable.  No signs of airway compromise.  Did have a CT scan that showed possible posterior tongue mass or growth.  This was limited by the fact that she was unable to get a contrasted CT scan due to her kidney injury.  Was given IV fluids and discussed with ENT who will perform fiberoptic laryngoscopy of the patient to better evaluate the lesion.  They require her to be transferred to [name] for this evaluation.  Discussed with medicine for admission.  Dispo: Admit to  Floor   Additional history obtained from daughter Records reviewed Outpatient Clinic Notes The following labs were independently interpreted: Chemistry and show AKI I personally reviewed and interpreted cardiac monitoring: normal sinus rhythm  Consults: Hospitalist and ENT   Final Clinical Impression(s) / ED Diagnoses Final diagnoses:  AKI (acute kidney injury) ([place])  Dysphagia, unspecified type  Tongue mass    Rx / DC Orders ED Discharge Orders     None         Fransico Meadow, MD 08/31/22 1116

## 2022-08-31 ENCOUNTER — Inpatient Hospital Stay (HOSPITAL_COMMUNITY): Payer: Medicare HMO

## 2022-08-31 DIAGNOSIS — K148 Other diseases of tongue: Secondary | ICD-10-CM | POA: Diagnosis not present

## 2022-08-31 LAB — GLUCOSE, CAPILLARY
Glucose-Capillary: 80 mg/dL (ref 70–99)
Glucose-Capillary: 74 mg/dL (ref 70–99)
Glucose-Capillary: 105 mg/dL — ABNORMAL HIGH (ref 70–99)
Glucose-Capillary: 98 mg/dL (ref 70–99)

## 2022-08-31 LAB — COMPREHENSIVE METABOLIC PANEL
ALT: 24 U/L (ref 0–44)
AST: 25 U/L (ref 15–41)
Albumin: 3.9 g/dL (ref 3.5–5.0)
Alkaline Phosphatase: 76 U/L (ref 38–126)
Anion gap: 17 — ABNORMAL HIGH (ref 5–15)
BUN: 71 mg/dL — ABNORMAL HIGH (ref 8–23)
CO2: 20 mmol/L — ABNORMAL LOW (ref 22–32)
Calcium: 10.8 mg/dL — ABNORMAL HIGH (ref 8.9–10.3)
Chloride: 105 mmol/L (ref 98–111)
Creatinine, Ser: 1.68 mg/dL — ABNORMAL HIGH (ref 0.44–1.00)
GFR, Estimated: 30 mL/min — ABNORMAL LOW (ref >60)
Glucose, Bld: 93 mg/dL (ref 70–99)
Potassium: 4.4 mmol/L (ref 3.5–5.1)
Sodium: 142 mmol/L (ref 135–145)
Total Bilirubin: 1.1 mg/dL (ref 0.3–1.2)
Total Protein: 7.7 g/dL (ref 6.5–8.1)

## 2022-08-31 LAB — CBC
HCT: 40.1 % (ref 36.0–46.0)
Hemoglobin: 12.6 g/dL (ref 12.0–15.0)
MCH: 26.4 pg (ref 26.0–34.0)
MCHC: 31.4 g/dL (ref 30.0–36.0)
MCV: 83.9 fL (ref 80.0–100.0)
Platelets: 248 10*3/uL (ref 150–400)
RBC: 4.78 MIL/uL (ref 3.87–5.11)
RDW: 15.1 % (ref 11.5–15.5)
WBC: 6.9 10*3/uL (ref 4.0–10.5)
nRBC: 0 % (ref 0.0–0.2)

## 2022-08-31 MED ORDER — CHLORHEXIDINE GLUCONATE CLOTH 2 % EX PADS
6.0000 | MEDICATED_PAD | Freq: Every day | CUTANEOUS | Status: DC
  Administered 2022-09-02 – 2022-09-09 (×8): 6 via TOPICAL

## 2022-08-31 MED ORDER — SODIUM CHLORIDE 0.9 % IV SOLN: INTRAVENOUS | Status: DC

## 2022-08-31 MED ORDER — GADOBUTROL 1 MMOL/ML IV SOLN
5.0000 mL | Freq: Once | INTRAVENOUS | Status: AC | PRN
  Administered 2022-08-31: 5 mL via INTRAVENOUS

## 2022-08-31 NOTE — Consult Note (Signed)
Reason for Consult: Dysphagia, possible tongue mass  HPI:  Sara [name] is an 81 y.o. female who presented to the [facility] emergency room yesterday complaining of progressive dysphagia and weight loss.  According to the patient, she has been symptomatic for more than 1 month.  She has a history of hypertension, acute kidney injury, and type 2 diabetes.  Her CT scan at [facility] long showed asymmetry of the right tongue base.  The patient has no previous history of tobacco use.  She denies any odynophagia or dyspnea.  Past Medical History:  Diagnosis Date   Allergy    Dizziness 02/21/2018   Estrogen deficiency 01/13/2018   Need for prophylactic vaccination against Streptococcus pneumoniae (pneumococcus) 01/13/2018   Right knee pain 05/29/2019   Routine general medical examination at a health care facility 01/13/2018   Seasonal allergies 06/21/2020   Vaginal itching 06/21/2020   Vision changes 01/13/2018    Past Surgical History:  Procedure Laterality Date   DG THUMB RIGHT HAND ([facility] HX) Right    EYE SURGERY     TUBAL LIGATION      Family History  Problem Relation Age of Onset   Heart disease Mother    Diabetes Mother    Alzheimer's disease Father    Cancer Sister    Cancer Brother    Alzheimer's disease Brother    Breast cancer Neg Hx     Social History:  reports that she has never smoked. She has never used smokeless tobacco. She reports that she does not drink alcohol and does not use drugs.  Allergies: No Known Allergies  Prior to Admission medications   Medication Sig Start Date End Date Taking? Authorizing Provider  atorvastatin (LIPITOR) 40 MG tablet Take 1 tablet (40 mg total) by mouth daily. 03/05/22  Yes Brimage, Ronnette Juniper, DO  canagliflozin (INVOKANA) 300 MG TABS tablet Take 1 tablet (300 mg total) by mouth daily before breakfast. 03/05/22  Yes Brimage, Vondra, DO  cetirizine (ZYRTEC) 10 MG tablet Take 1 tablet (10 mg total) by mouth daily as needed for allergies. 08/24/22   Yes Sowell, Erlene Quan, MD  diclofenac Sodium (VOLTAREN) 1 % GEL Apply 2 g topically 4 (four) times daily. Patient taking differently: Apply 2 g topically daily as needed (For pain). 04/06/21  Yes Milus Banister C, DO  gabapentin (NEURONTIN) 100 MG capsule Take 1 capsule (100 mg total) by mouth at bedtime. 10/05/21  Yes Brimage, Vondra, DO  insulin glargine (LANTUS SOLOSTAR) 100 UNIT/ML Solostar Pen Inject 18 Units into the skin daily. Patient taking differently: Inject 22 Units into the skin daily. 08/16/22  Yes Erskine Emery, MD  losartan (COZAAR) 50 MG tablet Take 1 tablet (50 mg total) by mouth at bedtime. 07/30/22  Yes Erskine Emery, MD  metFORMIN (GLUCOPHAGE) 500 MG tablet TAKE 2 TABLETS BY MOUTH TWICE DAILY WITH A MEAL Patient taking differently: Take 500 mg by mouth 2 (two) times daily with a meal. 08/21/22  Yes Maxwell, Allee, MD  Accu-Chek Softclix Lancets lancets Please use to check blood sugar up to 4 times daily. 08/21/22   Erskine Emery, MD  blood glucose meter kit and supplies KIT Dispense based on patient and insurance preference. Use up to four times daily as directed. 08/24/22   Holley Bouche, MD  glucose blood test strip 1 each by Other route 4 (four) times daily - after meals and at bedtime. 08/23/22   Erskine Emery, MD  Insulin Pen Needle (PEN NEEDLES 3/16") 31G X 5 MM MISC Use  as instructed to inject insulin once daily 10/05/21   Lyndee Hensen, DO    Medications: I have reviewed the patient's current medications. Scheduled:  Chlorhexidine Gluconate Cloth  6 each Topical Daily   Continuous:  sodium chloride 150 mL/hr at 08/31/22 0924   PRN:  Results for orders placed or performed during the hospital encounter of 08/30/22 (from the past 48 hour(s))  Comprehensive metabolic panel     Status: Abnormal   Collection Time: 08/30/22  1:36 PM  Result Value Ref Range   Sodium 137 135 - 145 mmol/L   Potassium 4.1 3.5 - 5.1 mmol/L   Chloride 104 98 - 111 mmol/L   CO2 24 22  - 32 mmol/L   Glucose, Bld 156 (H) 70 - 99 mg/dL    Comment: Glucose reference range applies only to samples taken after fasting for at least 8 hours.   BUN 84 (H) 8 - 23 mg/dL   Creatinine, Ser 2.13 (H) 0.44 - 1.00 mg/dL   Calcium 10.4 (H) 8.9 - 10.3 mg/dL   Total Protein 9.0 (H) 6.5 - 8.1 g/dL   Albumin 4.4 3.5 - 5.0 g/dL   AST 29 15 - 41 U/L   ALT 25 0 - 44 U/L   Alkaline Phosphatase 96 38 - 126 U/L   Total Bilirubin 0.9 0.3 - 1.2 mg/dL   GFR, Estimated 23 (L) >60 mL/min    Comment: (NOTE) Calculated using the CKD-EPI Creatinine Equation (2021)    Anion gap 9 5 - 15    Comment: Performed at [facility], [address] [place], [place] [address]  CBC     Status: None   Collection Time: 08/30/22  1:36 PM  Result Value Ref Range   WBC 6.5 4.0 - 10.5 K/uL   RBC 5.11 3.87 - 5.11 MIL/uL   Hemoglobin 13.5 12.0 - 15.0 g/dL   HCT 42.4 36.0 - 46.0 %   MCV 83.0 80.0 - 100.0 fL   MCH 26.4 26.0 - 34.0 pg   MCHC 31.8 30.0 - 36.0 g/dL   RDW 15.1 11.5 - 15.5 %   Platelets 315 150 - 400 K/uL   nRBC 0.0 0.0 - 0.2 %    Comment: Performed at [facility], [address] [place], [place] [address]  Glucose, capillary     Status: None   Collection Time: 08/30/22  9:58 PM  Result Value Ref Range   Glucose-Capillary 93 70 - 99 mg/dL    Comment: Glucose reference range applies only to samples taken after fasting for at least 8 hours.  Glucose, capillary     Status: Abnormal   Collection Time: 08/30/22 11:50 PM  Result Value Ref Range   Glucose-Capillary 105 (H) 70 - 99 mg/dL    Comment: Glucose reference range applies only to samples taken after fasting for at least 8 hours.  Comprehensive metabolic panel     Status: Abnormal   Collection Time: 08/31/22  3:37 AM  Result Value Ref Range   Sodium 142 135 - 145 mmol/L   Potassium 4.4 3.5 - 5.1 mmol/L   Chloride 105 98 - 111 mmol/L   CO2 20 (L) 22 - 32 mmol/L   Glucose, Bld 93 70 - 99 mg/dL    Comment: Glucose  reference range applies only to samples taken after fasting for at least 8 hours.   BUN 71 (H) 8 - 23 mg/dL   Creatinine, Ser 1.68 (H) 0.44 - 1.00 mg/dL  Calcium 10.8 (H) 8.9 - 10.3 mg/dL   Total Protein 7.7 6.5 - 8.1 g/dL   Albumin 3.9 3.5 - 5.0 g/dL   AST 25 15 - 41 U/L   ALT 24 0 - 44 U/L   Alkaline Phosphatase 76 38 - 126 U/L   Total Bilirubin 1.1 0.3 - 1.2 mg/dL   GFR, Estimated 30 (L) >60 mL/min    Comment: (NOTE) Calculated using the CKD-EPI Creatinine Equation (2021)    Anion gap 17 (H) 5 - 15    Comment: Performed at [facility] [address], [place], [place] [address]  CBC     Status: None   Collection Time: 08/31/22  3:37 AM  Result Value Ref Range   WBC 6.9 4.0 - 10.5 K/uL   RBC 4.78 3.87 - 5.11 MIL/uL   Hemoglobin 12.6 12.0 - 15.0 g/dL   HCT 40.1 36.0 - 46.0 %   MCV 83.9 80.0 - 100.0 fL   MCH 26.4 26.0 - 34.0 pg   MCHC 31.4 30.0 - 36.0 g/dL   RDW 15.1 11.5 - 15.5 %   Platelets 248 150 - 400 K/uL   nRBC 0.0 0.0 - 0.2 %    Comment: Performed at [facility], [place] [address] [place], [place] [address]  Glucose, capillary     Status: None   Collection Time: 08/31/22  8:02 AM  Result Value Ref Range   Glucose-Capillary 80 70 - 99 mg/dL    Comment: Glucose reference range applies only to samples taken after fasting for at least 8 hours.  Glucose, capillary     Status: None   Collection Time: 08/31/22 11:48 AM  Result Value Ref Range   Glucose-Capillary 74 70 - 99 mg/dL    Comment: Glucose reference range applies only to samples taken after fasting for at least 8 hours.  Glucose, capillary     Status: Abnormal   Collection Time: 08/31/22  4:03 PM  Result Value Ref Range   Glucose-Capillary 105 (H) 70 - 99 mg/dL    Comment: Glucose reference range applies only to samples taken after fasting for at least 8 hours.    DG Skull 1-3 Views  Result Date: 08/31/2022 CLINICAL DATA:  Screening for MRI. EXAM: SKULL - 1-3 VIEW COMPARISON:  None Available.  FINDINGS: There is no evidence of skull fracture or other focal bone lesions. No metallic densities are noted. IMPRESSION: No metallic densities are noted. No definite abnormality seen in the skull. Electronically Signed   By: Marijo Conception M.D.   On: 08/31/2022 11:52   CT Soft Tissue Neck Wo Contrast  Result Date: 08/30/2022 CLINICAL DATA:  Dysphagia. EXAM: CT NECK WITHOUT CONTRAST TECHNIQUE: Multidetector CT imaging of the neck was performed following the standard protocol without intravenous contrast. RADIATION DOSE REDUCTION: This exam was performed according to the departmental dose-optimization program which includes automated exposure control, adjustment of the mA and/or kV according to patient size and/or use of iterative reconstruction technique. COMPARISON:  None Available. FINDINGS: Pharynx and larynx: Asymmetry of the tongue. There is a larger volume of tissue in the tongue on the right than the left particularly posteriorly at the tongue base. No definite mass in the area however mucosal evaluation limited without intravenous contrast. Remainder of the pharynx normal.  Larynx normal.  Airway intact. Salivary glands: No inflammation, mass, or stone. Thyroid: Normal Lymph nodes: No enlarged lymph nodes in the neck. Vascular: Limited vascular evaluation without intravenous contrast. Limited intracranial: Negative  Visualized orbits: Negative Mastoids and visualized paranasal sinuses: Paranasal sinuses clear. Mastoid clear. Skeleton: Mild cervical spondylosis.  No acute skeletal abnormality. Upper chest: 5.5 mm sub solid nodule left upper lobe. Right upper lobe clear Other: None IMPRESSION: 1. Tongue asymmetry. Tongue volume is greater in the right tongue base than the left tongue base. Question prior surgery. Possible atrophy of the posterior tongue on the left versus enlargement of the right tongue base. Lack of intravenous contrast limits evaluation for mucosal lesion. Direct mucosal evaluation  recommended to exclude mass lesion. 2. No enlarged lymph nodes in the neck 3. 5.5 mm sub solid nodule left upper lobe. Recommend complete chest CT with contrast. Electronically Signed   By: Franchot Gallo M.D.   On: 08/30/2022 16:10   DG Chest 2 View  Result Date: 08/30/2022 CLINICAL DATA:  Chest congestion. EXAM: CHEST - 2 VIEW COMPARISON:  None Available. FINDINGS: The heart size and mediastinal contours are within normal limits. Both lungs are clear. The visualized skeletal structures are unremarkable. IMPRESSION: No active cardiopulmonary disease. Electronically Signed   By: Marijo Conception M.D.   On: 08/30/2022 13:46    Review Of Systems: Per HPI with the following additions:  Denies: fevers, chest pain, shortness of breath, abdominal pain. Does report some mild nausea and vomiting after doing [place] on Friday.  Blood pressure (!) 142/68, pulse 94, temperature 98.2 F (36.8 C), temperature source Oral, resp. rate 18, height _0  (1.549 m), weight 49.4 kg, SpO2 100 %. General appearance: alert and cooperative Head: Normocephalic, without obvious abnormality, atraumatic Eyes: Pupils are equal, round, reactive to light. Extraocular motion is intact.  Ears: Examination of the ears shows normal auricles and external auditory canals bilaterally. Both tympanic membranes are intact.  Nose: Nasal examination shows normal mucosa, septum, turbinates.  Face: Facial examination shows no asymmetry. Palpation of the face elicit no significant tenderness.  Mouth: Oral cavity examination shows no mucosal abnormalities. No significant trismus is noted.  Her tongue base is soft to palpation bilaterally. Neck: Palpation of the neck reveals no lymphadenopathy or mass. The trachea is midline. The thyroid is not significantly enlarged.  Neuro: Cranial nerves 2-12 are all grossly in tact.   Procedure:  Flexible Fiberoptic Laryngoscopy Anesthesia: None Indication: Dysphagia, possible tongue  mass Description: Risks, benefits, and alternatives of flexible endoscopy were explained to the patient. Specific mention was made of the risk of throat numbness with difficulty swallowing, possible bleeding from the nose and mouth, and pain from the procedure.  The patient gave oral consent to proceed.  A flexible scope was inserted into the right nasal cavity and advanced towards the nasopharynx.  Visualized mucosa over the turbinates and septum were normal.  The nasopharynx was clear.  Oropharyngeal walls were symmetric and mobile without lesion, mass, or edema.  Hypopharynx was also without  lesion or edema.  Larynx was mobile without lesions.  No lesions or asymmetry in the supraglottic larynx.  Arytenoid mucosa was normal.  Posterior commissure with edema and redundant mucosa.  True vocal folds were pale yellow and without mass or lesion.  Her tongue base was slightly more prominent on the right side.  However, no mucosal ulceration or other abnormality was noted.  Assessment/Plan: Dysphagia of unknown etiology.  Possible neurogenic causes. -No visible tongue base mucosal abnormality on her laryngoscopy examination. -Her tongue base is also soft to palpation bilaterally. -Her CT and MRI scan showed slight asymmetry of her tongue base.  However, no definable mass is  noted on either CT or MRI. -Consider neurology consultation.  Mykelle Cockerell W Jenisse Vullo 08/31/2022, 4:05 PM

## 2022-08-31 NOTE — Progress Notes (Signed)
Mobility Specialist Progress Note:   08/31/22 1023  Mobility  Activity Ambulated with assistance in hallway  Level of Assistance Contact guard assist, steadying assist  Assistive Device None  Distance Ambulated (ft) 200 ft  Activity Response Tolerated well  Mobility Referral Yes  $Mobility charge 1 Mobility   Pt received in bed and agreeable. No complaints. Pt left in bed with all needs met, call bell in reach, and family in room.   Mariena Meares Mobility Specialist-Acute Rehab Secure Chat only

## 2022-08-31 NOTE — Progress Notes (Addendum)
Daily Progress Note Intern Pager: [phone]  Patient name: Sara [name] Medical record number: [id number] Date of birth: September 24, 1941 Age: 81 y.o. Gender: female  Primary Care Provider: Erskine Emery, MD Consultants: ENT Code Status: Full code  Pt Overview and Major Events to Date:  11/2: Admitted to FMTS  Assessment and Plan:  Sara [name] is a 81 year old female presenting with progressive dysphagia, weight loss, and AKI. Pertinent PMH/PSH includes CKD 3, type 2 diabetes, hypertension.   * Tongue mass Noted on CT head with progressive dysphagia and copious oral secretions. Patient has only been able to take liquids. Has had 11lb weight loss in the last month. She has not had difficulty breathing and is protecting her airway. ENT consulted in ED for mass and plan to obtain MRI and evaluate afterwards. - ENT following, appreciate recs - F/u MRI brain - Npo for now given dysphagia, consider SLP evaluation - Vitals per unit routine - increase IV maintenance fluids to 150 ml/hr for dehydration   AKI (acute kidney injury) ([place]) Creatinine 2.13 on admission, baseline around 1. Likely in the setting of dysphagia with minimal oral intake.  - Continue IVF with NS '@100mL'$ /hr - Monitor with BMP  HTN (hypertension) Hypertensive on admission to 171/86, patient unable to take oral medications at this time. Will continue to closely monitor given current AKI and will administer medications via IV if concerning Bps. - Monitor BP closely  - Consider IV antihypertensives if BP >220/110  Type 2 diabetes mellitus without complication, with long-term current use of insulin (HCC) Home meds: metformin, insulin (22 Units daily), and invokana.  Has not taken insulin other than 1 time due to decreased oral intake and now has AKI. Given the inability to take oral, will hold off on insulin but will monitor CBGs.  - CBGs q6h  - Consider very sensitive SSI if CBGs severely elevated - Holding  home metformin, invokana, and insulin   FEN/GI: N.p.o. pending MRI and ENT recommendations PPx: SCDs Dispo: pending clinical improvement . Barriers include ongoing medical work-up.   Subjective:  Resting comfortably.  No acute events overnight.  She denies being in pain.  She reports her mouth is extremely dry and she is requesting something to drink.  Objective: Temp:  [97.5 F (36.4 C)-98.4 F (36.9 C)] 98.4 F (36.9 C) (11/03 0717) Pulse Rate:  [94-114] 95 (11/03 0717) Resp:  [15-18] 18 (11/03 0717) BP: (111-184)/(61-86) 122/61 (11/03 0717) SpO2:  [98 %-100 %] 100 % (11/03 0717) Weight:  [49.4 kg] 49.4 kg (11/02 1326) Physical Exam: General: Chronically ill-appearing, no acute distress Cardiovascular: Regular rate, regular them, no murmurs on exam Respiratory: Mild wheezing, faint crackles in the lung bases.  No increased work of breathing Abdomen: Soft, nontender nondistended Extremities: No peripheral edema, limb atrophy  Laboratory: Most recent CBC Lab Results  Component Value Date   WBC 6.9 08/31/2022   HGB 12.6 08/31/2022   HCT 40.1 08/31/2022   MCV 83.9 08/31/2022   PLT 248 08/31/2022   Most recent BMP    Latest Ref Rng & Units 08/31/2022    3:37 AM  BMP  Glucose 70 - 99 mg/dL 93   BUN 8 - 23 mg/dL 71   Creatinine 0.44 - 1.00 mg/dL 1.68   Sodium 135 - 145 mmol/L 142   Potassium 3.5 - 5.1 mmol/L 4.4   Chloride 98 - 111 mmol/L 105   CO2 22 - 32 mmol/L 20   Calcium 8.9 - 10.3 mg/dL 10.8  Darci Current, DO 08/31/2022, 9:14 AM  PGY-1, [place] Intern pager: [phone], text pages welcome Secure chat group [place]

## 2022-08-31 NOTE — TOC Initial Note (Signed)
Transition of Care [facility]) - Initial/Assessment Note    Patient Details  Name: ROVENA HEARLD MRN: [id number] Date of Birth: [birth year]  Transition of Care [facility]) CM/SW Contact:    Curlene Labrum, RN Phone Number: 08/31/2022, 3:27 PM  Clinical Narrative:                  Transition of Care [facility]) Screening Note   Patient Details  Name: KINESHA AUTEN Date of Birth: [birth year]   Transition of Care [facility]) CM/SW Contact:    Curlene Labrum, RN Phone Number: 08/31/2022, 3:27 PM    Transition of Care Department [facility]) has reviewed patient and no TOC needs have been identified at this time.   We will continue to monitor patient advancement through interdisciplinary progression rounds. If new patient transition needs arise, please place a TOC consult.          Patient Goals and CMS Choice        Expected Discharge Plan and Services                                                Prior Living Arrangements/Services                       Activities of Daily Living      Permission Sought/Granted                  Emotional Assessment              Admission diagnosis:  Tongue mass [K14.8] AKI (acute kidney injury) ([place]) [N17.9] Dysphagia, unspecified type [R13.10] Patient Active Problem List   Diagnosis Date Noted   Tongue mass 08/30/2022   AKI (acute kidney injury) ([place]) 08/30/2022   Acute rhinitis 08/24/2022   Bilateral hearing loss 07/30/2022   Murmur, cardiac 06/01/2022   Tinnitus 06/30/2021   Right leg pain 04/06/2021   Atherosclerotic vascular disease 09/16/2020   Need for immunization against influenza 09/16/2020   Stage 3a chronic kidney disease (CKD) ([place]) 09/14/2020   Neuropathic pain of both legs 06/23/2018   HTN (hypertension) 02/21/2018   Healthcare maintenance 01/13/2018   Type 2 diabetes mellitus without complication, with long-term current use of insulin ([place]) 01/13/2018   PCP:  Erskine Emery, MD Pharmacy:   [facility] [place] [facility] [place] [address] [place] [address] Phone: [phone] Fax: [phone]     Social Determinants of Health (SDOH) Interventions    Readmission Risk Interventions     No data to display

## 2022-08-31 NOTE — Evaluation (Addendum)
Clinical/Bedside Swallow Evaluation Patient Details  Name: Sara [name] MRN: [id number] Date of Birth: 1941-09-04  Today's Date: 08/31/2022 Time: SLP Start Time (ACUTE ONLY): 1204 SLP Stop Time (ACUTE ONLY): 1220 SLP Time Calculation (min) (ACUTE ONLY): 16 min  Past Medical History:  Past Medical History:  Diagnosis Date   Allergy    Dizziness 02/21/2018   Estrogen deficiency 01/13/2018   Need for prophylactic vaccination against Streptococcus pneumoniae (pneumococcus) 01/13/2018   Right knee pain 05/29/2019   Routine general medical examination at a health care facility 01/13/2018   Seasonal allergies 06/21/2020   Vaginal itching 06/21/2020   Vision changes 01/13/2018   Past Surgical History:  Past Surgical History:  Procedure Laterality Date   DG THUMB RIGHT HAND ([facility] HX) Right    EYE SURGERY     TUBAL LIGATION     HPI:  Sara [name] is a 81 year old female presenting with progressive dysphagia, weight loss, and AKI. Pertinent PMH/PSH includes CKD 3, type 2 diabetes, hypertension. Per chart ENT consulted for mass with plan for MRI. Only able to tolerate liquids.    Assessment / Plan / Recommendation  Clinical Impression  Pt seen for swallow assessment with periods of decreased saliva management with pt reports of cough/expelling mucous and evidence of oral suctioning with bedside canister. Oromotor exam revealed slight lingual deviation upon protrusion and reduced lateralization. She is endentulous with dentures present but not currently wearing. Vocal quality and volitional cough are strong and her cognition is intact. With self feeding she takes small bites and sips with report of intermittent pharyngeal globus sensation needing to "take a sip to wash it down." Subtle inconsistent throat clear present on 2 sips otherwise unremarkable. No signs aspiration were present with the chocolate pudding and a regular solid texture was not attempted. Results and options for po texture  discussed with pt and her son/daughter. Recommend initiation of full liquids, implementing strategies- most of which she already using. Per chart ENT has been consulted and further work up is under way. She will likely benefit from an instrumental swallow study to further evaluate function. SLP will follow. SLP Visit Diagnosis: Dysphagia, unspecified (R13.10)    Aspiration Risk  Mild aspiration risk    Diet Recommendation Thin liquid;Other (Comment) (full liquids)   Liquid Administration via: Cup Medication Administration: Crushed with puree Supervision: Patient able to self feed Compensations: Slow rate;Small sips/bites Postural Changes: Seated upright at 90 degrees    Other  Recommendations Recommended Consults:  (ENT consulted per chart) Oral Care Recommendations: Oral care BID    Recommendations for follow up therapy are one component of a multi-disciplinary discharge planning process, led by the attending physician.  Recommendations may be updated based on patient status, additional functional criteria and insurance authorization.  Follow up Recommendations  (TBD)      Assistance Recommended at Discharge PRN  Functional Status Assessment Patient has had a recent decline in their functional status and demonstrates the ability to make significant improvements in function in a reasonable and predictable amount of time.  Frequency and Duration min 2x/week  2 weeks       Prognosis Prognosis for Safe Diet Advancement: Good      Swallow Study   General Date of Onset: 08/30/22 HPI: Sara [name] is a 81 year old female presenting with progressive dysphagia, weight loss, and AKI. Pertinent PMH/PSH includes CKD 3, type 2 diabetes, hypertension. Per chart ENT consulted for mass with plan for MRI. Only able to tolerate liquids. Type of Study:  Bedside Swallow Evaluation Previous Swallow Assessment:  (none) Diet Prior to this Study: NPO Temperature Spikes Noted: No Respiratory  Status: Room air History of Recent Intubation: No Behavior/Cognition: Pleasant mood;Cooperative;Alert Oral Cavity Assessment: Within Functional Limits Oral Care Completed by SLP: No Oral Cavity - Dentition: Edentulous;Dentures, bottom;Dentures, top Vision: Functional for self-feeding Self-Feeding Abilities: Able to feed self Patient Positioning: Upright in bed Baseline Vocal Quality: Normal Volitional Cough: Strong Volitional Swallow: Able to elicit    Oral/Motor/Sensory Function Overall Oral Motor/Sensory Function: Mild impairment Facial ROM: Within Functional Limits Facial Symmetry: Within Functional Limits Facial Strength: Within Functional Limits Lingual ROM: Reduced right Lingual Symmetry: Abnormal symmetry right Mandible: Within Functional Limits   Ice Chips Ice chips: Not tested   Thin Liquid Thin Liquid: Impaired Presentation: Cup Pharyngeal  Phase Impairments: Throat Clearing - Immediate    Nectar Thick Nectar Thick Liquid: Not tested   Honey Thick Honey Thick Liquid: Not tested   Puree Puree: Within functional limits   Solid     Solid: Not tested      Sara [name] 08/31/2022,12:58 PM

## 2022-09-01 ENCOUNTER — Inpatient Hospital Stay (HOSPITAL_COMMUNITY): Payer: Medicare HMO

## 2022-09-01 LAB — BASIC METABOLIC PANEL
Anion gap: 7 (ref 5–15)
BUN: 42 mg/dL — ABNORMAL HIGH (ref 8–23)
CO2: 20 mmol/L — ABNORMAL LOW (ref 22–32)
Calcium: 9.9 mg/dL (ref 8.9–10.3)
Chloride: 115 mmol/L — ABNORMAL HIGH (ref 98–111)
Creatinine, Ser: 1.17 mg/dL — ABNORMAL HIGH (ref 0.44–1.00)
GFR, Estimated: 47 mL/min — ABNORMAL LOW (ref >60)
Glucose, Bld: 79 mg/dL (ref 70–99)
Potassium: 4.1 mmol/L (ref 3.5–5.1)
Sodium: 142 mmol/L (ref 135–145)

## 2022-09-01 LAB — GLUCOSE, CAPILLARY
Glucose-Capillary: 74 mg/dL (ref 70–99)
Glucose-Capillary: 84 mg/dL (ref 70–99)
Glucose-Capillary: 84 mg/dL (ref 70–99)
Glucose-Capillary: 73 mg/dL (ref 70–99)

## 2022-09-01 MED ORDER — GADOBUTROL 1 MMOL/ML IV SOLN
4.0000 mL | Freq: Once | INTRAVENOUS | Status: AC | PRN
  Administered 2022-09-01: 4 mL via INTRAVENOUS

## 2022-09-01 MED ORDER — LOSARTAN POTASSIUM 50 MG PO TABS
50.0000 mg | ORAL_TABLET | Freq: Every day | ORAL | Status: DC
  Administered 2022-09-01 – 2022-09-05 (×2): 50 mg via ORAL
  Filled 2022-09-01 (×3): qty 1

## 2022-09-01 NOTE — Progress Notes (Signed)
Mobility Specialist Progress Note:   09/01/22 1202  Mobility  Activity Ambulated with assistance in hallway  Level of Assistance Contact guard assist, steadying assist  Assistive Device None  Distance Ambulated (ft) 250 ft  Activity Response Tolerated well  Mobility Referral Yes  $Mobility charge 1 Mobility   Pt received in bed and agreeable. No complaints. Pt left sitting EOB with all needs met and call bell in reach.   Seymour Pavlak Mobility Specialist-Acute Rehab Secure Chat only

## 2022-09-01 NOTE — Plan of Care (Signed)
  Problem: Coping: Goal: Ability to adjust to condition or change in health will improve Outcome: Progressing   Problem: Nutritional: Goal: Maintenance of adequate nutrition will improve Outcome: Progressing   Problem: Skin Integrity: Goal: Risk for impaired skin integrity will decrease Outcome: Progressing

## 2022-09-01 NOTE — Progress Notes (Addendum)
Daily Progress Note Intern Pager: [phone]  Patient name: Sara [name] Medical record number: [id number] Date of birth: [birth year] Age: 81 y.o. Gender: female  Primary Care Provider: Erskine Emery, MD Consultants: ENT Code Status: Full Code   Pt Overview and Major Events to Date:  11/12: Admitted to FMTS   Assessment and Plan:  ZAELYN NOACK is a 81 year old female presenting with progressive dysphagia, weight loss, and AKI. Pertinent PMH/PSH includes CKD 3, type 2 diabetes, hypertension.   * Tongue mass 11 lb weight loss in 1 month. Protecting airway with no signs respiratory distress. MRI showed no discernable mass with possible right hypoglossal nerve involvement. On exam no visible abnormality. ENT recommended consult to neurology.  - ENT following, appreciate recs - F/u MRI brain - Discussed case with neurologist, Dr. Johnathan Hausen, this morning, recommended outpatient follow up with ENT with no need to consult inpatient.   AKI (acute kidney injury) ([place]) Creatinine 2.13 on admission, baseline around 1. Likely in the setting of dysphagia with minimal oral intake.  - Continue IVF with NS '@100mL'$ /hr - Monitor with BMP  HTN (hypertension) Hypertensive on admission to 171/86, patient unable to take oral medications at this time. Will continue to closely monitor given current AKI and will administer medications via IV if concerning Bps. - Monitor BP closely  - Consider IV antihypertensives if BP >220/110  Type 2 diabetes mellitus without complication, with long-term current use of insulin (HCC) Home meds: metformin, insulin (22 Units daily), and invokana.  Has not taken insulin other than 1 time due to decreased oral intake and now has AKI. Given the inability to take oral, will hold off on insulin but will monitor CBGs.  - CBGs q6h  - Consider very sensitive SSI if CBGs severely elevated - Holding home metformin, invokana, and insulin   FEN/GI: full liquids  PPx:  SCDs Dispo:Home tomorrow. Barriers include ongoing medical workup.   Subjective:  No acute events overnight. Patient is resting comfortably with no new complaints. Discussed MRI results with her and options for evaluation outpatient. Patient in agreement with plan.   Objective: Temp:  [98.2 F (36.8 C)-98.7 F (37.1 C)] 98.2 F (36.8 C) (11/04 0807) Pulse Rate:  [84-94] 86 (11/04 0807) Resp:  [17-18] 17 (11/04 0807) BP: (132-157)/(68-76) 136/68 (11/04 0807) SpO2:  [100 %] 100 % (11/04 0807) Physical Exam: General: thin, well appearing, no acute distress  Cardiovascular: regular rate, regular rhythm, no murmurs on exam  Respiratory: clear, no wheezing, no crackles. No increased work of breathing  Abdomen: soft, non-tender  Extremities: no peripheral edema  Laboratory: Most recent CBC Lab Results  Component Value Date   WBC 6.9 08/31/2022   HGB 12.6 08/31/2022   HCT 40.1 08/31/2022   MCV 83.9 08/31/2022   PLT 248 08/31/2022   Most recent BMP    Latest Ref Rng & Units 09/01/2022    4:33 AM  BMP  Glucose 70 - 99 mg/dL 79   BUN 8 - 23 mg/dL 42   Creatinine 0.44 - 1.00 mg/dL 1.17   Sodium 135 - 145 mmol/L 142   Potassium 3.5 - 5.1 mmol/L 4.1   Chloride 98 - 111 mmol/L 115   CO2 22 - 32 mmol/L 20   Calcium 8.9 - 10.3 mg/dL 9.9    MRI:  IMPRESSION: 1. Well-demarcated asymmetric enlargement and edema involving the right aspect of the oral tongue without discrete mass. Overall appearance is most typical of evolving acute denervation changes. Finding is  of uncertain etiology, as no mass lesion or other structural abnormality is seen along the expected course of the right hypoglossal nerve. Given the patient history of diabetes and hypertension, query whether this reflects post ischemic changes. Correlation with dedicated brain MRI could be performed for further evaluation, although the presence of a visible stroke may be difficult/limited given the patient's  symptomatology over 1 month. Clinical follow-up to ensure normal expected progressive atrophy of the oral tongue is suggested. 2. No other acute abnormality within the neck.  Darci Current, DO 09/01/2022, 11:14 AM  PGY-1, [place] Intern pager: [phone], text pages welcome Secure chat group [place]

## 2022-09-01 NOTE — Progress Notes (Signed)
Modified Barium Swallow Progress Note  Patient Details  Name: Sara [name] MRN: [id number] Date of Birth: 1941-05-21  Today's Date: 09/01/2022  Modified Barium Swallow completed.  Full report located under Chart Review in the Imaging Section.  Brief recommendations include the following:  Clinical Impression  Pt presents with a primary pharyngeal dysphagia marked by significant difficulty transferring material through her pharynx.  There was impaired laryngeal elevation, poor pharyngeal contraction/stripping, reduced and incomplete epiglottic inversion, reduced base-of-tongue contact to pharyngeal wall at the onset of the swallow.  All of these deficits led to barium (thin, nectar, and puree) sitting in the hypopharynx, requiring multiple attempts by pt to repetitively but ineffectivly swallow to pass material through UES. Continuous breaks were required so that she could expectorate residuals.  There was eventual spillage of barium into the larynx, reaching the level of the vocal folds. There was a secondary oral component to her dysphagia with difficulty propelling the bolus into the pharynx. However, she may have also developed some compensatory and maladaptive behaviors in an effort to better control the quantity that enters her pharynx. The quality of her swallow is consistent with a neurological etiology; recommend consideration of MRI and/or neuro consult. Recommend she continue full liquids as able, but she will likely need temporary enteral feeding until a plan is determined for disposition and follow-up. Spoke with Dr. Sabra Heck regarding findings.   Swallow Evaluation Recommendations       SLP Diet Recommendations: Other (Comment) (continue full liquids as able)   Liquid Administration via: Cup;Straw   Medication Administration: Crushed with puree   Supervision: Patient able to self feed   Compensations: Follow solids with liquid       Oral Care Recommendations: Oral care  BID      Sara Tudor L. Tivis Ringer, MA CCC/SLP Clinical Specialist - Acute Care SLP Acute Rehabilitation Services Office number [phone]   Sara [name] 09/01/2022,3:24 PM

## 2022-09-02 DIAGNOSIS — R1311 Dysphagia, oral phase: Secondary | ICD-10-CM

## 2022-09-02 LAB — GLUCOSE, CAPILLARY
Glucose-Capillary: 84 mg/dL (ref 70–99)
Glucose-Capillary: 69 mg/dL — ABNORMAL LOW (ref 70–99)
Glucose-Capillary: 86 mg/dL (ref 70–99)
Glucose-Capillary: 79 mg/dL (ref 70–99)

## 2022-09-02 LAB — BASIC METABOLIC PANEL
Anion gap: 10 (ref 5–15)
BUN: 23 mg/dL (ref 8–23)
CO2: 21 mmol/L — ABNORMAL LOW (ref 22–32)
Calcium: 10.2 mg/dL (ref 8.9–10.3)
Chloride: 111 mmol/L (ref 98–111)
Creatinine, Ser: 0.95 mg/dL (ref 0.44–1.00)
GFR, Estimated: >60 mL/min (ref >60)
Glucose, Bld: 81 mg/dL (ref 70–99)
Potassium: 4 mmol/L (ref 3.5–5.1)
Sodium: 142 mmol/L (ref 135–145)

## 2022-09-02 NOTE — Progress Notes (Signed)
Speech Language Pathology Treatment: Dysphagia  Patient Details Name: Sara [name] MRN: [id number] DOB: 12/15/1940 Today's Date: 09/02/2022 Time: 6962-9528 SLP Time Calculation (min) (ACUTE ONLY): 32 min  Assessment / Plan / Recommendation Clinical Impression  F/u after yesterday's MBS.  Pt's daughter, Sara [name], was present.  Sara [name] participated in oral mechanism exam - she demonstrates significant tongue deviation to the right with what appears to be right sided atrophy. She tried a single bite of applesauce, but was unable to swallow it despite great effort and self-suctioned to remove it. She has several small sips of water.  There is constant throat-clearing and repetitive swallows but only minimal intake. She is unable to swallow her saliva, so relies on suctioning for its removal.  We reviewed imaging from yesterday's MBS and discussed with Sara [name] the severity of her mother's dysphagia.  Sara [name] family is coming tomorrow to meet with the medical team to discuss the benefits vs burdens of pursuing a G-tube.  Encouraged Sara [name] to write down the specific problems she is having so that she can discuss it with the team.  SLP will follow.    HPI HPI: Sara [name] is a 81 year old female presenting with progressive dysphagia x one month, 20 lb. weight loss, and AKI. Pertinent PMH/PSH includes CKD 3, type 2 diabetes, hypertension. ENT consult 11/3 with no specific findings excluding "tongue base was slightly more prominent on the right side. However, no mucosal ulceration or other abnormality was noted." Dr.Teoh found no definable mass on CT or MRI; recommended consideration of neurology consultation. MR neck soft tissue: "Well-demarcated asymmetric enlargement and edema involving the right aspect of the oral tongue without discrete mass. Overall appearance is most typical of evolving acute denervation changes."  Sara [name] food "not going down." She is unable to swallow her saliva  and tends to expectorate it most of the time; she reports better of ease of swallowing of liquids than solids.      SLP Plan  Continue with current plan of care      Recommendations for follow up therapy are one component of a multi-disciplinary discharge planning process, led by the attending physician.  Recommendations may be updated based on patient status, additional functional criteria and insurance authorization.    Recommendations  Diet recommendations: Other(comment) (full liquids) Medication Administration: Crushed with puree Compensations: Follow solids with liquid                Oral Care Recommendations: Oral care BID Follow Up Recommendations: Outpatient SLP Assistance recommended at discharge: PRN SLP Visit Diagnosis: Dysphagia, oropharyngeal phase (R13.12) Plan: Continue with current plan of care         Shatima Zalar L. Tivis Ringer, MA CCC/SLP Clinical Specialist - Acute Care SLP Acute Rehabilitation Services Office number [phone]   Juan Quam Laurice  09/02/2022, 1:26 PM

## 2022-09-02 NOTE — Progress Notes (Signed)
Daily Progress Note Intern Pager: [phone]  Patient name: Sara [name] Medical record number: [id number] Date of birth: January 30, 1941 Age: 81 y.o. Gender: female  Primary Care Provider: Erskine Emery, MD Consultants: ENT Code Status: FULL  Pt Overview and Major Events to Date:  11/12: Admitted to FMTS   Assessment and Plan:  BEV DRENNEN is a 81 year old female presenting with progressive dysphagia, weight loss, and AKI. Pertinent PMH/PSH includes CKD 3, type 2 diabetes, hypertension.    * Tongue mass 11 lb weight loss in 1 month. Protecting airway with no signs respiratory distress. Reports some difficulty swallowing. MRI brain shows no acute abnormalities, remote CVA present. On exam no visible abnormality.  - ENT following, appreciate recs -ASA for CVA prophylaxis, can start in OP - Discussed case with neurologist, Dr. Johnathan Hausen, yesterday morning, recommended outpatient follow up with ENT with no need to consult inpatient.  - Discuss with family on Monday regarding PEG tube placement  AKI (acute kidney injury) ([place]) Creatinine baseline around 1. Creatinine downtrending: 1.68>1.17>0.95.  - Continue IVF with NS '@75mL'$ /hr - Monitor with BMP  HTN (hypertension) Hypertensive on admission to 171/86, patient unable to take oral medications at this time. Will continue to closely monitor given current AKI and will administer medications via IV if concerning Bps. - Monitor BP closely  - Consider IV antihypertensives if BP >220/110  Type 2 diabetes mellitus without complication, with long-term current use of insulin (HCC) Home meds: metformin, insulin (22 Units daily), and invokana.  CBG today ranging in 70s-80s. Holding insulin as CBGs have been stable.  - CBGs q6h  - Consider very sensitive SSI if CBGs severely elevated - Holding home metformin, invokana, and insulin       FEN/GI: full liquids  PPx: SCDs Dispo:Home tomorrow. Barriers include ongoing medical  workup.   Subjective:  Patient was seen this morning. She voiced no concerns other than some difficulty swallowing liquids. She denies pain.  Objective: Temp:  [97.4 F (36.3 C)-98.4 F (36.9 C)] 97.7 F (36.5 C) (11/05 0719) Pulse Rate:  [82-99] 84 (11/05 0719) Resp:  [16-17] 17 (11/05 0719) BP: (133-169)/(68-81) 133/68 (11/05 0719) SpO2:  [100 %] 100 % (11/05 0719) Physical Exam: General: thin, well appearing, no acute distress Cardiovascular: RRR, no murmurs on exam Respiratory: Mild wheezing in right side. No increased work of breathing Abdomen: soft, nontender Extremities: no peripheral edema  Laboratory: Most recent CBC Lab Results  Component Value Date   WBC 6.9 08/31/2022   HGB 12.6 08/31/2022   HCT 40.1 08/31/2022   MCV 83.9 08/31/2022   PLT 248 08/31/2022   Most recent BMP    Latest Ref Rng & Units 09/02/2022    2:27 AM  BMP  Glucose 70 - 99 mg/dL 81   BUN 8 - 23 mg/dL 23   Creatinine 0.44 - 1.00 mg/dL 0.95   Sodium 135 - 145 mmol/L 142   Potassium 3.5 - 5.1 mmol/L 4.0   Chloride 98 - 111 mmol/L 111   CO2 22 - 32 mmol/L 21   Calcium 8.9 - 10.3 mg/dL 10.2      Imaging/Diagnostic Tests: MRI Brain IMPRESSION: 1. No acute intracranial abnormality. Specifically, no visible acute or recent medullary infarct to explain the findings on recent MRI of the neck. 2. Moderately advanced chronic microvascular ischemic disease with a remote lacunar infarct involving the left lentiform nucleus.  MRI:  IMPRESSION: 1. Well-demarcated asymmetric enlargement and edema involving the right aspect of the oral tongue  without discrete mass. Overall appearance is most typical of evolving acute denervation changes. Finding is of uncertain etiology, as no mass lesion or other structural abnormality is seen along the expected course of the right hypoglossal nerve. Given the patient history of diabetes and hypertension, query whether this reflects post ischemic  changes. Correlation with dedicated brain MRI could be performed for further evaluation, although the presence of a visible stroke may be difficult/limited given the patient's symptomatology over 1 month. Clinical follow-up to ensure normal expected progressive atrophy of the oral tongue is suggested. 2. No other acute abnormality within the neck.  PGY-1, [place] Intern pager: [phone], text pages welcome Secure chat group [place]

## 2022-09-02 NOTE — Progress Notes (Signed)
Mobility Specialist Progress Note:   09/02/22 1541  Mobility  Activity Ambulated with assistance in hallway  Level of Assistance Standby assist, set-up cues, supervision of patient - no hands on  Assistive Device None  Distance Ambulated (ft) 500 ft  Activity Response Tolerated well  Mobility Referral Yes  $Mobility charge 1 Mobility   Pt received sitting EOB and agreeable. No complaints. Pt left sitting EOB with all needs met, call bell in reach, and family in room.   Savana Spina Mobility Specialist-Acute Rehab Secure Chat only

## 2022-09-03 ENCOUNTER — Inpatient Hospital Stay (HOSPITAL_COMMUNITY): Payer: Medicare HMO

## 2022-09-03 DIAGNOSIS — K148 Other diseases of tongue: Secondary | ICD-10-CM | POA: Diagnosis not present

## 2022-09-03 DIAGNOSIS — R1310 Dysphagia, unspecified: Secondary | ICD-10-CM

## 2022-09-03 LAB — GLUCOSE, CAPILLARY
Glucose-Capillary: 64 mg/dL — ABNORMAL LOW (ref 70–99)
Glucose-Capillary: 74 mg/dL (ref 70–99)
Glucose-Capillary: 74 mg/dL (ref 70–99)
Glucose-Capillary: 85 mg/dL (ref 70–99)
Glucose-Capillary: 69 mg/dL — ABNORMAL LOW (ref 70–99)
Glucose-Capillary: 72 mg/dL (ref 70–99)

## 2022-09-03 LAB — BASIC METABOLIC PANEL
Anion gap: 7 (ref 5–15)
BUN: 16 mg/dL (ref 8–23)
CO2: 21 mmol/L — ABNORMAL LOW (ref 22–32)
Calcium: 9.7 mg/dL (ref 8.9–10.3)
Chloride: 113 mmol/L — ABNORMAL HIGH (ref 98–111)
Creatinine, Ser: 0.89 mg/dL (ref 0.44–1.00)
GFR, Estimated: >60 mL/min (ref >60)
Glucose, Bld: 71 mg/dL (ref 70–99)
Potassium: 3.8 mmol/L (ref 3.5–5.1)
Sodium: 141 mmol/L (ref 135–145)

## 2022-09-03 MED ORDER — METOPROLOL TARTRATE 5 MG/5ML IV SOLN
5.0000 mg | Freq: Four times a day (QID) | INTRAVENOUS | Status: DC | PRN
  Administered 2022-09-03 – 2022-09-04 (×2): 5 mg via INTRAVENOUS
  Filled 2022-09-03 (×2): qty 5

## 2022-09-03 MED ORDER — IOHEXOL 350 MG/ML SOLN
75.0000 mL | Freq: Once | INTRAVENOUS | Status: AC | PRN
  Administered 2022-09-03: 75 mL via INTRAVENOUS

## 2022-09-03 NOTE — Progress Notes (Signed)
Speech Language Pathology Treatment: Dysphagia  Patient Details Name: Sara [name] MRN: [id number] DOB: 1940-11-20 Today's Date: 09/03/2022 Time: 22-     Assessment / Plan / Recommendation Clinical Impression  F/u for education re: dysphagia at the request of Dr. Sabra Heck who had arranged a family meeting. Reviewed imaging from [facility] and discussed results and recommendations.  Family understands that the severity of her dysphagia will continue to prohibit adequate nutrition. Pt and family agree with pursuing G-tube. They understand that Sara [name] can continue to eat/drink as able, to hold POs when coughing becomes problematic. They understand that she should begin OP SLP to address dysphagia tx. Answered pt/family questions as able.  SLP will continue to follow while admitted.    HPI HPI: Sara [name] is a 81 year old female presenting with progressive dysphagia x one month, 20 lb. weight loss, and AKI. Pertinent PMH/PSH includes CKD 3, type 2 diabetes, hypertension. ENT consult 11/3 with no specific findings excluding "tongue base was slightly more prominent on the right side. However, no mucosal ulceration or other abnormality was noted." Dr.Teoh found no definable mass on CT or MRI; recommended consideration of neurology consultation. MR neck soft tissue: "Well-demarcated asymmetric enlargement and edema involving the right aspect of the oral tongue without discrete mass. Overall appearance is most typical of evolving acute denervation changes."  Sara [name] c/o food "not going down." She is unable to swallow her saliva and tends to expectorate it most of the time; she reports better of ease of swallowing of liquids than solids.      SLP Plan  Continue with current plan of care      Recommendations for follow up therapy are one component of a multi-disciplinary discharge planning process, led by the attending physician.  Recommendations may be updated based on patient status, additional  functional criteria and insurance authorization.    Recommendations  Diet recommendations: Other(comment) (full liquids) Medication Administration: Crushed with puree Compensations: Follow solids with liquid                Oral Care Recommendations: Oral care BID Follow Up Recommendations: Outpatient SLP Assistance recommended at discharge: PRN SLP Visit Diagnosis: Dysphagia, oropharyngeal phase (R13.12) Plan: Continue with current plan of care           Juan Quam Laurice  09/03/2022, 12:53 PM

## 2022-09-03 NOTE — Consult Note (Signed)
NEURO HOSPITALIST CONSULT NOTE   Requestig physician: Dr. Ardelia Mems   Reason for Consult:Dysphagia   History obtained from: Patient and Chart  HPI:                                                                                                                                          Sara [name] is an 81 y.o. female with a PMHx of CKD 3, HTN, T2DM, HLD, and cataract surgery presenting with a 1 month history of dysphagia, accompanied by 20 pound weight loss.  Patient describes a sudden onset of dysphagia, denies any other motor or sensory deficits at the time.  She describes having difficulty swallowing solids or soft foods.  She describes it as a "lump-like" sensation at the back of her throat.  Denies any pain when swallowing.  States that she is able to drink thin liquids, warm or cold drinks.  Denies any changes in speech, states that she wears dentures and that her pronunciation can be muffled due to that.  Also notes that she prefers not to swallow saliva and it pools inside of her mouth.  Otherwise denies any other complaints.  Denies any recent falls, confusion, or pain.  Patient reports history of PT DM well controlled with metformin, insulin, and diet.  She endorses taking Lipitor for her HLD.  Past Medical History:  Diagnosis Date   Allergy    Dizziness 02/21/2018   Estrogen deficiency 01/13/2018   Need for prophylactic vaccination against Streptococcus pneumoniae (pneumococcus) 01/13/2018   Right knee pain 05/29/2019   Routine general medical examination at a health care facility 01/13/2018   Seasonal allergies 06/21/2020   Vaginal itching 06/21/2020   Vision changes 01/13/2018    Past Surgical History:  Procedure Laterality Date   DG THUMB RIGHT HAND ([facility] HX) Right    EYE SURGERY     TUBAL LIGATION      Family History  Problem Relation Age of Onset   Heart disease Mother    Diabetes Mother    Alzheimer's disease Father    Cancer Sister     Cancer Brother    Alzheimer's disease Brother    Breast cancer Neg Hx    Social History:  reports that she has never smoked. She has never used smokeless tobacco. She reports that she does not drink alcohol and does not use drugs.  No Known Allergies  MEDICATIONS:  I have reviewed the patient's current medications. Prior to Admission:  Medications Prior to Admission  Medication Sig Dispense Refill Last Dose   atorvastatin (LIPITOR) 40 MG tablet Take 1 tablet (40 mg total) by mouth daily. 90 tablet 3 Past Week   canagliflozin (INVOKANA) 300 MG TABS tablet Take 1 tablet (300 mg total) by mouth daily before breakfast. 30 tablet 11 Past Week   cetirizine (ZYRTEC) 10 MG tablet Take 1 tablet (10 mg total) by mouth daily as needed for allergies. 30 tablet 11 Past Week   diclofenac Sodium (VOLTAREN) 1 % GEL Apply 2 g topically 4 (four) times daily. (Patient taking differently: Apply 2 g topically daily as needed (For pain).) 100 g 1 Past Week   gabapentin (NEURONTIN) 100 MG capsule Take 1 capsule (100 mg total) by mouth at bedtime. 90 capsule 3 Past Week   insulin glargine (LANTUS SOLOSTAR) 100 UNIT/ML Solostar Pen Inject 18 Units into the skin daily. (Patient taking differently: Inject 22 Units into the skin daily.) 15 mL 2 Past Week   losartan (COZAAR) 50 MG tablet Take 1 tablet (50 mg total) by mouth at bedtime. 90 tablet 0 Past Week   metFORMIN (GLUCOPHAGE) 500 MG tablet TAKE 2 TABLETS BY MOUTH TWICE DAILY WITH A MEAL (Patient taking differently: Take 500 mg by mouth 2 (two) times daily with a meal.) 360 tablet 0 Past Week   Accu-Chek Softclix Lancets lancets Please use to check blood sugar up to 4 times daily. 400 each 1    blood glucose meter kit and supplies KIT Dispense based on patient and insurance preference. Use up to four times daily as directed. 1 each 0    glucose blood  test strip 1 each by Other route 4 (four) times daily - after meals and at bedtime. 100 each 12    Insulin Pen Needle (PEN NEEDLES 3/16") 31G X 5 MM MISC Use as instructed to inject insulin once daily 90 each 3      ROS:                                                                                                                                       History obtained from chart review and the patient  General ROS: negative for - chills, fatigue, fever, night sweats, weight gain or weight loss Psychological ROS: negative for - behavioral disorder, hallucinations, memory difficulties, mood swings or suicidal ideation Ophthalmic ROS: negative for - blurry vision, double vision, eye pain or loss of vision ENT ROS: negative for - epistaxis, nasal discharge, oral lesions, sore throat, tinnitus or vertigo Allergy and Immunology ROS: negative for - hives or itchy/watery eyes Hematological and Lymphatic ROS: negative for - bleeding problems, bruising or swollen lymph nodes Endocrine ROS: negative for - galactorrhea, hair pattern changes, polydipsia/polyuria or temperature intolerance Respiratory ROS: negative for - cough, hemoptysis, shortness of breath or wheezing Cardiovascular ROS: negative for -  chest pain, dyspnea on exertion, edema or irregular heartbeat Gastrointestinal ROS: negative for - abdominal pain, diarrhea, hematemesis, nausea/vomiting or stool incontinence Genito-Urinary ROS: negative for - dysuria, hematuria, incontinence or urinary frequency/urgency Musculoskeletal ROS: negative for - joint swelling or muscular weakness Neurological ROS: as noted in HPI Dermatological ROS: negative for rash and skin lesion changes   Blood pressure (!) 150/71, pulse 82, temperature 97.9 F (36.6 C), temperature source Oral, resp. rate 18, height _0  (1.549 m), weight 49.4 kg, SpO2 100 %.   General Examination:                                                                                                       Physical Exam  General:  HEENT-  Normocephalic, no lesions, without obvious abnormality.  Normal external eye and conjunctiva.   Cardiovascular- S1-S2 audible, pulses palpable throughout   Lungs-no rhonchi or wheezing noted, no excessive working breathing.  Saturations within normal limits Abdomen- All 4 quadrants palpated and nontender Extremities- Warm, dry and intact Musculoskeletal-no joint tenderness, deformity or swelling Skin-warm and dry, no hyperpigmentation, vitiligo, or suspicious lesions  Neurological Examination Mental Status: Alert, oriented, thought content appropriate.  Speech fluent without evidence of aphasia.  Able to follow 3 step commands without difficulty. Cranial Nerves: II: Discs flat bilaterally; Visual fields grossly normal,  III,IV, VI: ptosis not present, extra-ocular motions intact bilaterally pupils equal, round, reactive to light and accommodation V,VII: smile symmetric, facial light touch sensation normal bilaterally VIII: hearing normal bilaterally IX,X: uvula rises symmetrically, remains in midline.  XI: bilateral shoulder shrug XII: Asymmetric tongue extension, deviating to the right with atrophy on right. Has some difficulty pronouncing T and D words.  Motor: UE Strength  Right  Left Deltoids  5/5  5/5 Triceps   5/5  5/5 Biceps   5/5  5/5 Wrist FL  5/5  5/5 Wrist Ext  5/5  5/5 Finger grip  5/5  5/5 LE Strength  Right  Left Hip Flex  5/5  5/5 Knee flexed  5/5  5/5 Knee X  5/5  5/5 Dorsiflexion  5/5  5/5 Plantarflexion  5/5  5/5  Tone and bulk:normal tone throughout; no atrophy noted Sensory: Pinprick and light touch intact throughout, bilaterally Cerebellar: FNF: WNL Rapid alternating movements: WNL Heel-to-shin: WNL Gait: normal gait and station   Lab Results: Basic Metabolic Panel: Recent Labs  Lab 08/30/22 1336 08/31/22 0337 09/01/22 0433 09/02/22 0227 09/03/22 0244  NA 137 142 142 142 141  K 4.1 4.4 4.1 4.0  3.8  CL 104 105 115* 111 113*  CO2 24 20* 20* 21* 21*  GLUCOSE 156* 93 79 81 71  BUN 84* 71* 42* 23 16  CREATININE 2.13* 1.68* 1.17* 0.95 0.89  CALCIUM 10.4* 10.8* 9.9 10.2 9.7    CBC: Recent Labs  Lab 08/30/22 1336 08/31/22 0337  WBC 6.5 6.9  HGB 13.5 12.6  HCT 42.4 40.1  MCV 83.0 83.9  PLT 315 248    Cardiac Enzymes: No results for input(s): "CKTOTAL", "CKMB", "CKMBINDEX", "TROPONINI" in the last 168 hours.  Lipid Panel: No results for input(s): "CHOL", "TRIG", "HDL", "CHOLHDL", "VLDL", "LDLCALC" in the last 168 hours.  Imaging: MR BRAIN W WO CONTRAST  Result Date: 09/02/2022 CLINICAL DATA:  Initial evaluation for neuro deficit, stroke suspected. Difficulty swallowing. EXAM: MRI HEAD WITHOUT AND WITH CONTRAST TECHNIQUE: Multiplanar, multiecho pulse sequences of the brain and surrounding structures were obtained without and with intravenous contrast. CONTRAST:  56m GADAVIST GADOBUTROL 1 MMOL/ML IV SOLN COMPARISON:  Comparison made with prior MRI of the neck from 09/01/2023. FINDINGS: Brain: Cerebral volume within normal limits for age. Patchy T2/FLAIR hyperintensity involving the periventricular and deep white matter both cerebral hemispheres, most characteristic of chronic microvascular ischemic disease, moderately advanced in nature. Remote lacunar infarct present at the posterior left lentiform nucleus. No abnormal foci of restricted diffusion to suggest acute or subacute ischemia. Specifically, no acute or recent medullary infarct to explain the findings on recent MRI of the neck. Gray-white matter differentiation otherwise maintained. No acute or chronic intracranial blood products. No mass lesion, midline shift or mass effect. No hydrocephalus or extra-axial fluid collection. Pituitary gland and suprasellar region within normal limits. No abnormal enhancement. Vascular: Major intracranial vascular flow voids are maintained. Skull and upper cervical spine: Craniocervical junction  within normal limits. Bone marrow signal intensity within normal limits. No scalp soft tissue abnormality. Sinuses/Orbits: Patient status post ocular lens replacement on the right. Globes normal soft tissues demonstrate no acute finding. Mild scattered mucosal thickening noted about the ethmoidal air cells. Paranasal sinuses are otherwise clear. Trace bilateral mastoid effusions, of doubtful significance. Other: None. IMPRESSION: 1. No acute intracranial abnormality. Specifically, no visible acute or recent medullary infarct to explain the findings on recent MRI of the neck. 2. Moderately advanced chronic microvascular ischemic disease with a remote lacunar infarct involving the left lentiform nucleus. Electronically Signed   By: BJeannine BogaM.D.   On: 09/02/2022 06:37   DG Swallowing Func-Speech Pathology  Result Date: 09/01/2022 Table formatting from the original result was not included. Images from the original result were not included. Objective Swallowing Evaluation: Type of Study: MBS-Modified Barium Swallow Study  Patient Details Name: Sara LASUREMRN: [id number] of Birth: [birth year]Today's Date: 09/01/2022 Time: SLP Start Time (ACUTE ONLY): 1305 -SLP Stop Time (ACUTE ONLY): 1345 SLP Time Calculation (min) (ACUTE ONLY): 40 min Past Medical History: Past Medical History: Diagnosis Date  Allergy   Dizziness 02/21/2018  Estrogen deficiency 01/13/2018  Need for prophylactic vaccination against Streptococcus pneumoniae (pneumococcus) 01/13/2018  Right knee pain 05/29/2019  Routine general medical examination at a health care facility 01/13/2018  Seasonal allergies 06/21/2020  Vaginal itching 06/21/2020  Vision changes 01/13/2018 Past Surgical History: Past Surgical History: Procedure Laterality Date  DG THUMB RIGHT HAND ([facility] HX) Right   EYE SURGERY    TUBAL LIGATION   HPI: Sara ROEDIGERis a 81year old female presenting with progressive dysphagia, weight loss, and AKI. Pertinent PMH/PSH  includes CKD 3, type 2 diabetes, hypertension.  ENT consult 11/3 with no specific findings: " Oropharyngeal walls were symmetric and mobile without lesion, mass, or edema.  Hypopharynx was also without  lesion or edema.  Larynx was mobile without lesions.  No lesions or asymmetry in the supraglottic larynx.  Arytenoid mucosa was normal.  Posterior commissure with edema and redundant mucosa.  True vocal folds were pale yellow and without mass or lesion.  Her tongue base was slightly more prominent on the right side.  However, no mucosal ulceration or other abnormality was noted." Dr.Teoh found  no definable mass on CT or MRI;  recommended consideration of neurology consultation.  Ms. Domzalski c/o food "not going down." She is unable to swallow her saliva and tends to expectorate it most of the time; she reports better of ease of swallowing of liquids than solids.  No data recorded  Recommendations for follow up therapy are one component of a multi-disciplinary discharge planning process, led by the attending physician.  Recommendations may be updated based on patient status, additional functional criteria and insurance authorization. Assessment / Plan / Recommendation   09/01/2022   3:00 PM Clinical Impressions Clinical Impression Pt presents with a primary pharyngeal dysphagia marked by significant difficulty transferring material through her pharynx.  There was impaired laryngeal elevation, poor pharyngeal contraction/stripping, reduced and incomplete epiglottic inversion, reduced base-of-tongue contact to pharyngeal wall at the onset of the swallow.  All of these deficits led to barium (thin, nectar, and puree) sitting in the hypopharynx, requiring multiple attempts by pt to repetitively but ineffectivly swallow to pass material through UES. Continuous breaks were required so that she could expectorate residuals.  There was eventual spillage of barium into the larynx, reaching the level of the vocal folds. There was a  secondary oral component to her dysphagia with difficulty propelling the bolus into the pharynx. However, she may have also developed some compensatory and maladaptive behaviors in an effort to better control the quantity that enters her pharynx. The quality of her swallow is consistent with a neurological etiology; recommend consideration of MRI and/or neuro consult. Recommend she continue full liquids as able, but she will likely need temporary enteral feeding until a plan is determined for disposition and follow-up. Spoke with Dr. Sabra Heck regarding findings. SLP Visit Diagnosis Dysphagia, oropharyngeal phase (R13.12) Impact on safety and function Moderate aspiration risk     09/01/2022   3:00 PM Treatment Recommendations Treatment Recommendations Therapy as outlined in treatment plan below     09/01/2022   3:00 PM Prognosis Prognosis for Safe Diet Advancement Fair   09/01/2022   3:00 PM Diet Recommendations SLP Diet Recommendations Other (Comment) Liquid Administration via Cup;Straw Medication Administration Crushed with puree Compensations Follow solids with liquid     09/01/2022   3:00 PM Other Recommendations Oral Care Recommendations Oral care BID Follow Up Recommendations Other (comment) Assistance recommended at discharge PRN Functional Status Assessment Patient has had a recent decline in their functional status and demonstrates the ability to make significant improvements in function in a reasonable and predictable amount of time.   09/01/2022   3:00 PM Frequency and Duration  Speech Therapy Frequency (ACUTE ONLY) min 2x/week Treatment Duration 2 weeks     09/01/2022   3:00 PM Oral Phase Oral Phase Impaired Oral - Puree Weak lingual manipulation;Reduced posterior propulsion    09/01/2022   3:00 PM Pharyngeal Phase Pharyngeal Phase Impaired Pharyngeal- Nectar Straw Delayed swallow initiation-vallecula;Delayed swallow initiation-pyriform sinuses;Reduced pharyngeal peristalsis;Reduced epiglottic inversion;Reduced  laryngeal elevation;Reduced tongue base retraction;Penetration/Aspiration before swallow;Pharyngeal residue - valleculae;Pharyngeal residue - pyriform Pharyngeal- Thin Cup Delayed swallow initiation-vallecula;Delayed swallow initiation-pyriform sinuses;Reduced pharyngeal peristalsis;Reduced epiglottic inversion;Reduced laryngeal elevation;Reduced tongue base retraction;Penetration/Aspiration before swallow;Pharyngeal residue - valleculae;Pharyngeal residue - pyriform Pharyngeal Material enters airway, CONTACTS cords and not ejected out Pharyngeal- Thin Straw Delayed swallow initiation-vallecula;Delayed swallow initiation-pyriform sinuses;Reduced pharyngeal peristalsis;Reduced epiglottic inversion;Reduced laryngeal elevation;Reduced tongue base retraction;Penetration/Aspiration before swallow;Pharyngeal residue - valleculae;Pharyngeal residue - pyriform Pharyngeal Material enters airway, CONTACTS cords and not ejected out     No data to display    Juan Quam Laurice 09/01/2022,  3:25 PM                      Assessment and plan per attending neurologist   Assessment/  81 year old female with PMHx of CKD 3, T2DM, HLD, and HTN presenting with 1 month history of sudden onset dysphagia and progressive 20 pound weight loss, with difficulty swallowing solids.  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 She woke up one day and was unable to swallow. Etiology unclear as MRI imaging has been negative and no other focal deficits appreciated on physical exam.  DDx includes old CVA, not appreciated on imaging given 1 month history of symptoms, also peculiar given the isolated nature of the findings and no other cranial dysfunction. 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 Will get CT angio head and neck with and without contrast to evaluate for any carotid dissection/pseudoaneurysm.  MRI  brain negative for any intracranial abnormalities MRI of neck and soft tissue shows asymmetric edema of the tongue on the right with no discrete masses.  Tongue overall appearance most typical with resolving acute denervation changes. There is also slight asymmetry of right palatine tonsils.  Recommendations: -SLP evaluation and speech therapy outpatient -Will likely need PEG tube given recovery will likekly be slow and incomplete. -Follow-up with neurology outpatient for EMG/NCS -CT angio head and neck to evaluate for Carotid dissection/pseudoaneurysm.    Christene Slates, MD PGY-1 09/03/2022, 9:11 AM    NEUROHOSPITALIST ADDENDUM Performed a face to face diagnostic evaluation.   I have reviewed the contents of history and physical exam as documented by PA/ARNP/Resident and agree with above documentation.  I have discussed and formulated the above plan as documented. Edits to the note have been made as needed.  Donnetta Simpers, MD [facility] [phone]   If 7pm to 7am, please call on call as listed on AMION.

## 2022-09-03 NOTE — Progress Notes (Signed)
  ENT initially consulted to investigate possible causes of dysphagia, imaging thus far inconclusive. ENT recommended investigating neurogenic etiology. Consulted neurologist, Dr. Lorrin Goodell, who agrees to see the patient. Greatly appreciate recommendations of Dr. Lorrin Goodell so that we can eliminate any possible neurological etiology. Plan for family discussion later this morning to discuss possible PEG placement or other nutritional options.    Donney Dice, DO 09/03/2022, 8:08 AM PGY-3, [facility] pager [phone]

## 2022-09-03 NOTE — Significant Event (Signed)
Hypoglycemic Event  CBG: 69  Treatment: 4 oz juice/soda  Symptoms: None  Follow-up CBG: [id number]:[id number] CBG Result:72  Possible Reasons for Event: Inadequate meal intake  Comments/MD notified:Patient had no s/s. Dinner tray arrived.    Creig Hines, Susa Raring

## 2022-09-03 NOTE — TOC Initial Note (Signed)
Transition of Care [facility]) - Initial/Assessment Note    Patient Details  Name: Sara [name] MRN: [id number] Date of Birth: [birth year]  Transition of [facility]) CM/SW Contact:    Curlene Labrum, RN Phone Number: 09/03/2022, 2:19 PM  Clinical Narrative:                  Transition of Care [facility]) Screening Note   Patient Details  Name: Sara [name] Date of Birth: 1940/11/22   Transition of Care [facility]) CM/SW Contact:    Curlene Labrum, RN Phone Number: 09/03/2022, 2:19 PM    Transition of Care Department [facility]) has reviewed patient and no TOC needs have been identified at this time. We will continue to monitor patient advancement through interdisciplinary progression rounds. If new patient transition needs arise, please place a TOC consult.    Expected Discharge Plan: Home/Self Care Barriers to Discharge: Continued Medical Work up   Patient Goals and CMS Choice Patient states their goals for this hospitalization and ongoing recovery are:: To return home CMS Medicare.gov Compare Post Acute Care list provided to:: Patient Choice offered to / list presented to : Patient  Expected Discharge Plan and Services Expected Discharge Plan: Home/Self Care   Discharge Planning Services: CM Consult   Living arrangements for the past 2 months: Single Family Home                                      Prior Living Arrangements/Services Living arrangements for the past 2 months: Single Family Home Lives with:: Adult Children Patient language and need for interpreter reviewed:: Yes Do you feel safe going back to the place where you live?: Yes      Need for Family Participation in Patient Care: Yes (Comment) Care giver support system in place?: Yes (comment) Current home services: DME (Glucometer at home) Criminal Activity/Legal Involvement Pertinent to Current Situation/Hospitalization: No - Comment as needed  Activities of Daily Living       Permission Sought/Granted Permission sought to share information with : Case Manager, Family Supports Permission granted to share information with : Yes, Verbal Permission Granted        Permission granted to share info w Relationship: daughter, Hamda Klutts - [phone]     Emotional Assessment Appearance:: Appears stated age Attitude/Demeanor/Rapport: Gracious Affect (typically observed): Accepting Orientation: : Oriented to Self, Oriented to Place, Oriented to  Time, Oriented to Situation Alcohol / Substance Use: Not Applicable Psych Involvement: No (comment)  Admission diagnosis:  Tongue mass [K14.8] AKI (acute kidney injury) ([place]) [N17.9] Dysphagia, unspecified type [R13.10] Patient Active Problem List   Diagnosis Date Noted   Oral phase dysphagia 09/02/2022   Tongue mass 08/30/2022   AKI (acute kidney injury) ([place]) 08/30/2022   Acute rhinitis 08/24/2022   Bilateral hearing loss 07/30/2022   Murmur, cardiac 06/01/2022   Tinnitus 06/30/2021   Right leg pain 04/06/2021   Atherosclerotic vascular disease 09/16/2020   Need for immunization against influenza 09/16/2020   Stage 3a chronic kidney disease (CKD) ([place]) 09/14/2020   Neuropathic pain of both legs 06/23/2018   HTN (hypertension) 02/21/2018   Healthcare maintenance 01/13/2018   Type 2 diabetes mellitus without complication, with long-term current use of insulin ([place]) 01/13/2018   PCP:  Erskine Emery, MD Pharmacy:   [facility] ([facility]), [place] - [address] [place] [address] Phone:  [phone] Fax: [phone]     Social Determinants of Health (SDOH) Interventions    Readmission Risk Interventions    09/03/2022    2:19 PM  Readmission Risk Prevention Plan  Post Dischage Appt Complete  Medication Screening Complete  Transportation Screening Complete

## 2022-09-03 NOTE — Progress Notes (Signed)
Mobility Specialist Progress Note:   09/03/22 1025  Mobility  Activity Ambulated with assistance in hallway  Level of Assistance Standby assist, set-up cues, supervision of patient - no hands on  Assistive Device None  Distance Ambulated (ft) 350 ft  Activity Response Tolerated well  Mobility Referral Yes  $Mobility charge 1 Mobility   Pt received sitting EOB and agreeable. No complaints. Pt left sitting EOB with all needs met, call bell in reach, and family in room.   Antaniya Venuti Mobility Specialist-Acute Rehab Secure Chat only

## 2022-09-03 NOTE — Care Management Important Message (Signed)
Important Message  Patient Details  Name: Sara [name] MRN: [id number] Date of Birth: [birth year]   Medicare Important Message Given:  Yes     Orbie Pyo 09/03/2022, 4:01 PM

## 2022-09-03 NOTE — Progress Notes (Signed)
Daily Progress Note Intern Pager: [phone]  Patient name: Sara [name] Medical record number: [id number] Date of birth: Mar 07, 1941 Age: 81 y.o. Gender: female  Primary Care Provider: Erskine Emery, MD Consultants: ENT, neuro Code Status: Full code  Pt Overview and Major Events to Date:  11/12: Admitted FMTS  Assessment and Plan:  Sara [name] is a 81 year old female presenting with progressive dysphagia, weight loss, and AKI. Pertinent PMH/PSH includes CKD 3, type 2 diabetes, hypertension.     * Tongue mass 11 lb weight loss in 1 month. Protecting airway with no signs respiratory distress. Reports some difficulty swallowing. MRI brain shows no acute abnormalities, remote CVA present. On exam no visible abnormality.   - With high risk of aspiration and abnormal respiratory exam, CXR ordered to rule out aspiration pneumonia - ENT following, appreciate recs - ASA for CVA prophylaxis, can start in OP - Neurology consulted, appreciate recommendations - SLP following, appreciate care and recommendations - PEG tube discussion with family this morning  AKI (acute kidney injury) ([place]) Creatinine baseline around 1. Creatinine downtrending: 1.68>1.17>0.95.  - Continue IVF with NS '@75mL'$ /hr - Monitor with BMP  HTN (hypertension) Hypertensive on admission to 171/86, patient unable to take oral medications at this time. Will continue to closely monitor given current AKI and will administer medications via IV if concerning Bps. - Monitor BP closely  - Consider IV antihypertensives if BP >220/110  Type 2 diabetes mellitus without complication, with long-term current use of insulin (HCC) Home meds: metformin, insulin (22 Units daily), and invokana.  CBG today ranging in 70s-80s. Holding insulin as CBGs have been stable.  - CBGs q6h  - Consider very sensitive SSI if CBGs severely elevated - Holding home metformin, invokana, and insulin   FEN/GI: thin liquids  PPx:  SDCs Dispo:Home pending clinical improvement . Barriers include ongoing medical workup.   Subjective:  No acute events overnight.  Patient is resting comfortably.  She has no new complaints.  She reports that she is drinking clear liquids but has to cough regularly to clear.  She reports no shortness of breath or difficulty breathing.  Objective: Temp:  [97.6 F (36.4 C)-99.8 F (37.7 C)] 97.9 F (36.6 C) (11/06 0734) Pulse Rate:  [81-98] 82 (11/06 0734) Resp:  [17-18] 18 (11/06 0734) BP: (149-169)/(71-89) 150/71 (11/06 0734) SpO2:  [99 %-100 %] 100 % (11/06 0734) Physical Exam: General: Cachectic, chronically ill-appearing, no acute distress Cardiovascular: Regular rate, regular rhythm, no murmurs on exam Respiratory: Diffuse crackles with moderate upper lung base wheezing bilaterally.  No increased work of breathing Abdomen: Soft, nontender, nondistended Extremities: No peripheral edema  Laboratory: Most recent CBC Lab Results  Component Value Date   WBC 6.9 08/31/2022   HGB 12.6 08/31/2022   HCT 40.1 08/31/2022   MCV 83.9 08/31/2022   PLT 248 08/31/2022   Most recent BMP    Latest Ref Rng & Units 09/03/2022    2:44 AM  BMP  Glucose 70 - 99 mg/dL 71   BUN 8 - 23 mg/dL 16   Creatinine 0.44 - 1.00 mg/dL 0.89   Sodium 135 - 145 mmol/L 141   Potassium 3.5 - 5.1 mmol/L 3.8   Chloride 98 - 111 mmol/L 113   CO2 22 - 32 mmol/L 21   Calcium 8.9 - 10.3 mg/dL 9.7    Darci Current, DO 09/03/2022, 9:00 AM  PGY-1, [place] Intern pager: [phone], text pages welcome Secure chat group [place]  [facility] Teaching Service

## 2022-09-04 ENCOUNTER — Other Ambulatory Visit: Payer: Self-pay | Admitting: Family Medicine

## 2022-09-04 DIAGNOSIS — Z794 Long term (current) use of insulin: Secondary | ICD-10-CM

## 2022-09-04 DIAGNOSIS — K148 Other diseases of tongue: Secondary | ICD-10-CM | POA: Diagnosis not present

## 2022-09-04 LAB — GLUCOSE, CAPILLARY
Glucose-Capillary: 100 mg/dL — ABNORMAL HIGH (ref 70–99)
Glucose-Capillary: 65 mg/dL — ABNORMAL LOW (ref 70–99)
Glucose-Capillary: 66 mg/dL — ABNORMAL LOW (ref 70–99)
Glucose-Capillary: 71 mg/dL (ref 70–99)
Glucose-Capillary: 72 mg/dL (ref 70–99)
Glucose-Capillary: 75 mg/dL (ref 70–99)

## 2022-09-04 LAB — BASIC METABOLIC PANEL
Anion gap: 9 (ref 5–15)
BUN: 13 mg/dL (ref 8–23)
CO2: 24 mmol/L (ref 22–32)
Calcium: 10 mg/dL (ref 8.9–10.3)
Chloride: 107 mmol/L (ref 98–111)
Creatinine, Ser: 0.92 mg/dL (ref 0.44–1.00)
GFR, Estimated: >60 mL/min (ref >60)
Glucose, Bld: 77 mg/dL (ref 70–99)
Potassium: 3.6 mmol/L (ref 3.5–5.1)
Sodium: 140 mmol/L (ref 135–145)

## 2022-09-04 MED ORDER — CEFAZOLIN SODIUM-DEXTROSE 2-4 GM/100ML-% IV SOLN
2.0000 g | INTRAVENOUS | Status: AC
  Filled 2022-09-04: qty 100

## 2022-09-04 MED ORDER — DEXTROSE-NACL 5-0.9 % IV SOLN: INTRAVENOUS | Status: DC

## 2022-09-04 NOTE — Progress Notes (Addendum)
Brief Neuro Update:  CT angio with no dissection. 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  No further inpatient neurological workup at this time. Please have her follow up with neurology outpatient for EMG/NCS.  We will signoff.  Marty Pager Number [phone]

## 2022-09-04 NOTE — Progress Notes (Signed)
Speech Language Pathology Treatment: Dysphagia  Patient Details Name: Sara [name] MRN: [id number] DOB: [birth year] Today's Date: 09/04/2022 Time: 5277-8242 SLP Time Calculation (min) (ACUTE ONLY): 28 min  Assessment / Plan / Recommendation Clinical Impression  Sara [name] was seen for dysphagia tx and education.  Yesterday's CTA of neck showed no dissection or abnormality. 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  Offered two exercises for pt to begin working on: Masako maneuver and effortful swallow. We discussed the rationale for each and for her to begin doing three sets of 20 reps daily (more if she is willing). She verbalized understanding and carried them out with mod I cues. Provided with written instructions. She sipped water and attempted head tilt to left to determine improvement in bolus flow; she noted marginal improvement.    Recommend that she participate in OP SLP to address dysphagia; return in 6-8 weeks for repeat MBS as OP.    HPI HPI: Sara [name] is a 81 year old female presenting with progressive dysphagia x one month, 20 lb. weight loss, and AKI. Pertinent PMH/PSH includes CKD 3, type 2 diabetes, hypertension. ENT consult 11/3 with no specific findings excluding "tongue base was slightly more prominent on the right side. However, no mucosal ulceration or other abnormality was noted." Dr.Teoh found no definable mass on CT or MRI; recommended consideration of neurology consultation. MR neck soft tissue: "Well-demarcated asymmetric enlargement and edema involving the right aspect of the oral tongue without discrete mass. Overall appearance is most typical of evolving acute denervation changes."  Sara [name] c/o food "not going down." She is unable to swallow her saliva and tends to expectorate it most of the time; she reports better of ease of swallowing of liquids than solids.       SLP Plan  Continue with current plan of care      Recommendations for follow up therapy are one component of a multi-disciplinary discharge planning process, led by the attending physician.  Recommendations may be updated based on patient status, additional functional criteria and insurance authorization.    Recommendations  Diet recommendations: Other(comment) (full liquids) Liquids provided via: Straw Medication Administration: Crushed with puree Supervision: Patient able to self feed                Oral Care Recommendations: Oral care BID Follow Up Recommendations: Outpatient SLP Assistance recommended at discharge: PRN SLP Visit Diagnosis: Dysphagia, oropharyngeal phase (R13.12) Plan: Continue with current plan of care         Sara [name] L. Tivis Ringer, MA CCC/SLP Clinical Specialist - Acute Care SLP Acute Rehabilitation Services Office number [phone]   Sara [name]  09/04/2022, 5:09 PM

## 2022-09-04 NOTE — Progress Notes (Signed)
Mobility Specialist Progress Note   09/04/22 1505  Mobility  Activity Ambulated with assistance in hallway  Level of Assistance Standby assist, set-up cues, supervision of patient - no hands on  Assistive Device None  Distance Ambulated (ft) 280 ft  Range of Motion/Exercises Active;All extremities  Activity Response Tolerated well   Patient received in supine eager to participate in mobility. Ambulated supervision level with steady gait. Returned to room without complaint or incident. Was left in supine with all needs met, call bell in reach.   [place], [place], [place]  Office: [phone]

## 2022-09-04 NOTE — Significant Event (Signed)
Hypoglycemic Event  CBG: 65  Treatment: 4 oz juice/soda  Symptoms: None  Follow-up CBG: [id number] CBG Result: 66  Treatment: Ice cream  Symptoms: None  Follow-up CBG: Time : 0721, CBG Result: 75  Possible Reasons for Event: Inadequate meal intake  Comments/MD notified: Patient breakfast tray will arrive soon. Patient encouraged to eat a majority.    Sara [name]

## 2022-09-04 NOTE — Progress Notes (Signed)
Daily Progress Note Intern Pager: [phone]  Patient name: Sara [name] Medical record number: [id number] Date of birth: [birth year] Age: 81 y.o. Gender: female  Primary Care Provider: Erskine Emery, MD Consultants: ENT, Neuro  Code Status: full code   Pt Overview and Major Events to Date:  11/12: Admitted to FMTS   Assessment and Plan:  Sara [name] is a 81 year old female presenting with progressive dysphagia, weight loss, and AKI. Pertinent PMH/PSH includes CKD 3, type 2 diabetes, hypertension.      * Tongue mass 11 lb weight loss in 1 month. Protecting airway with no signs respiratory distress. MRI brain shows no acute abnormalities, remote CVA present.  CTA neck and head showed no dissection or abnormality.  Etiology of right hypoglossal nerve is likely idiopathic cranial neuropathy secondary to longstanding type 2 diabetes.   - ASA for CVA prophylaxis, can start in OP - Neurology s/o, follow-up outpatient - SLP following, follow-up outpatient - PEG tube pending, IR consulted  AKI (acute kidney injury) ([place]) Resolved.  Creatinine back to baseline. - Continue IV maintenance fluids, due to poor p.o. intake  HTN (hypertension) Continues to be hypertensive but patient unable to take oral medications with dysphagia.  - Monitor BP  - Consider IV antihypertensives if BP >220/110  Type 2 diabetes mellitus without complication, with long-term current use of insulin (HCC) Home meds: metformin, insulin (22 Units daily), and invokana.  CBG today ranging in 70s-80s. Holding insulin as CBGs have been stable.  - CBGs q6h  - Consider very sensitive SSI if CBGs severely elevated - Holding home metformin, invokana, and insulin   FEN/GI: thin liquids  PPx: SCDs Dispo:Home  pending IR procedure . Barriers include ongoing medical management.   Subjective:  No acute events overnight.  Patient resting company.  She has no new complaints.  She reports no increased work of  breathing or shortness of breath.  Objective: Temp:  [97.5 F (36.4 C)-98.3 F (36.8 C)] 97.5 F (36.4 C) (11/07 0723) Pulse Rate:  [82-104] 87 (11/07 0723) Resp:  [17-18] 18 (11/07 0723) BP: (126-177)/(78-90) 126/90 (11/07 0723) SpO2:  [99 %-100 %] 100 % (11/07 0723) Weight:  [49.4 kg] 49.4 kg (11/06 1300) Physical Exam: General: Thin, well-appearing, no acute distress Cardiovascular: Regular rate, regular rhythm, no murmurs on exam Respiratory: Clear, no wheezing, no consolidations, no crackles.  No increased work of breathing Abdomen: Soft, nontender, nondistended Extremities: No peripheral edema  Laboratory: Most recent CBC Lab Results  Component Value Date   WBC 6.9 08/31/2022   HGB 12.6 08/31/2022   HCT 40.1 08/31/2022   MCV 83.9 08/31/2022   PLT 248 08/31/2022   Most recent BMP    Latest Ref Rng & Units 09/04/2022    3:17 AM  BMP  Glucose 70 - 99 mg/dL 77   BUN 8 - 23 mg/dL 13   Creatinine 0.44 - 1.00 mg/dL 0.92   Sodium 135 - 145 mmol/L 140   Potassium 3.5 - 5.1 mmol/L 3.6   Chloride 98 - 111 mmol/L 107   CO2 22 - 32 mmol/L 24   Calcium 8.9 - 10.3 mg/dL 10.0    CTA Head and Neck:  IMPRESSION: 1. No acute intracranial process. 2. Moderate stenosis at the origin of the right vertebral artery and in the right V1 segment. No other significant stenosis in the neck. 3. No intracranial large vessel occlusion or significant stenosis. 4. Redemonstrated asymmetric enlargement of the right tongue. 5. Unchanged 5 mm subsolid  nodule in the left upper lobe, as seen on the 08/30/2022 neck CT. As before, a dedicated chest CT with contrast is recommended for further evaluation.  Darci Current, DO 09/04/2022, 8:05 AM  PGY-1, [place] Intern pager: [phone], text pages welcome Secure chat group [place]

## 2022-09-04 NOTE — Consult Note (Signed)
Chief Complaint: Patient was seen in consultation today for percutaneous gastric tube placement Chief Complaint  Patient presents with   Dysphagia   at the request of Dr Dinah Beers  Supervising Physician: Jacqulynn Cadet  Patient Status: [name] - In-pt  History of Present Illness: Sara [name] is a 81 y.o. female   Tongue mass Progressive dysphagia Wt loss CKD3; DM2; HTN  CT 08/31/22: IMPRESSION: 1. Well-demarcated asymmetric enlargement and edema involving the right aspect of the oral tongue without discrete mass. Overall appearance is most typical of evolving acute denervation changes. Finding is of uncertain etiology, as no mass lesion or other structural abnormality is seen along the expected course of the right hypoglossal nerve. Given the patient history of diabetes and hypertension, query whether this reflects post ischemic changes. Correlation with dedicated brain MRI could be performed for further evaluation, although the presence of a visible stroke may be difficult/limited given the patient's symptomatology over 1 month. Clinical follow-up to ensure normal expected progressive atrophy of the oral tongue is suggested. 2. No other acute abnormality within the neck.  Past Medical History:  Diagnosis Date   Allergy    Dizziness 02/21/2018   Estrogen deficiency 01/13/2018   Need for prophylactic vaccination against Streptococcus pneumoniae (pneumococcus) 01/13/2018   Right knee pain 05/29/2019   Routine general medical examination at a health care facility 01/13/2018   Seasonal allergies 06/21/2020   Vaginal itching 06/21/2020   Vision changes 01/13/2018    Past Surgical History:  Procedure Laterality Date   DG THUMB RIGHT HAND ([facility] HX) Right    EYE SURGERY     TUBAL LIGATION      Allergies: Patient has no known allergies.  Medications: Prior to Admission medications   Medication Sig Start Date End Date Taking? Authorizing Provider  atorvastatin  (LIPITOR) 40 MG tablet Take 1 tablet (40 mg total) by mouth daily. 03/05/22  Yes Brimage, Ronnette Juniper, DO  canagliflozin (INVOKANA) 300 MG TABS tablet Take 1 tablet (300 mg total) by mouth daily before breakfast. 03/05/22  Yes Brimage, Vondra, DO  cetirizine (ZYRTEC) 10 MG tablet Take 1 tablet (10 mg total) by mouth daily as needed for allergies. 08/24/22  Yes Sowell, Erlene Quan, MD  diclofenac Sodium (VOLTAREN) 1 % GEL Apply 2 g topically 4 (four) times daily. Patient taking differently: Apply 2 g topically daily as needed (For pain). 04/06/21  Yes Milus Banister C, DO  gabapentin (NEURONTIN) 100 MG capsule Take 1 capsule (100 mg total) by mouth at bedtime. 10/05/21  Yes Brimage, Vondra, DO  insulin glargine (LANTUS SOLOSTAR) 100 UNIT/ML Solostar Pen Inject 18 Units into the skin daily. Patient taking differently: Inject 22 Units into the skin daily. 08/16/22  Yes Erskine Emery, MD  losartan (COZAAR) 50 MG tablet Take 1 tablet (50 mg total) by mouth at bedtime. 07/30/22  Yes Erskine Emery, MD  metFORMIN (GLUCOPHAGE) 500 MG tablet TAKE 2 TABLETS BY MOUTH TWICE DAILY WITH A MEAL Patient taking differently: Take 500 mg by mouth 2 (two) times daily with a meal. 08/21/22  Yes Maxwell, Allee, MD  Accu-Chek Softclix Lancets lancets Please use to check blood sugar up to 4 times daily. 08/21/22   Erskine Emery, MD  blood glucose meter kit and supplies KIT Dispense based on patient and insurance preference. Use up to four times daily as directed. 08/24/22   Holley Bouche, MD  glucose blood test strip 1 each by Other route 4 (four) times daily - after meals and at bedtime. 08/23/22  Erskine Emery, MD  Insulin Pen Needle (PEN NEEDLES 3/16") 31G X 5 MM MISC Use as instructed to inject insulin once daily 10/05/21   Lyndee Hensen, DO     Family History  Problem Relation Age of Onset   Heart disease Mother    Diabetes Mother    Alzheimer's disease Father    Cancer Sister    Cancer Brother    Alzheimer's disease  Brother    Breast cancer Neg Hx     Social History   Socioeconomic History   Marital status: Widowed    Spouse name: Not on file   Number of children: 3   Years of education: 12   Highest education level: High school graduate  Occupational History   Occupation: Retired  Tobacco Use   Smoking status: Never   Smokeless tobacco: Never  Vaping Use   Vaping Use: Never used  Substance and Sexual Activity   Alcohol use: Never   Drug use: Never   Sexual activity: Not Currently  Other Topics Concern   Not on file  Social History Narrative   Patient lives in [place] with her daughter and great grandson.    Patient still drives, however limits where she goes.    Patient is retired.    Patient enjoys crosswords, working in her yard, spending time with family, and she is very active in her church.    Social Determinants of Health   Financial Resource Strain: Low Risk  (07/05/2021)   Overall Financial Resource Strain (CARDIA)    Difficulty of Paying Living Expenses: Not hard at all  Food Insecurity: No Food Insecurity (07/05/2021)   Hunger Vital Sign    Worried About Running Out of Food in the Last Year: Never true    Ran Out of Food in the Last Year: Never true  Transportation Needs: No Transportation Needs (07/05/2021)   PRAPARE - Hydrologist (Medical): No    Lack of Transportation (Non-Medical): No  Physical Activity: Insufficiently Active (07/05/2021)   Exercise Vital Sign    Days of Exercise per Week: 2 days    Minutes of Exercise per Session: 20 min  Stress: No Stress Concern Present (07/05/2021)   [place]    Feeling of Stress : Not at all  Social Connections: Moderately Integrated (07/05/2021)   Social Connection and Isolation Panel [NHANES]    Frequency of Communication with Friends and Family: More than three times a week    Frequency of Social Gatherings with Friends and Family:  More than three times a week    Attends Religious Services: More than 4 times per year    Active Member of [employer] or Organizations: Yes    Attends Archivist Meetings: More than 4 times per year    Marital Status: Widowed    Review of Systems: A 12 point ROS discussed and pertinent positives are indicated in the HPI above.  All other systems are negative.    Vital Signs: BP (!) 126/90 (BP Location: Left Arm)   Pulse 87   Temp (!) 97.5 F (36.4 C) (Oral)   Resp 18   Ht _0  (1.549 m)   Wt 109 lb (49.4 kg)   SpO2 100%   BMI 20.60 kg/m     Physical Exam Vitals reviewed.  HENT:     Mouth/Throat:     Mouth: Mucous membranes are moist.  Eyes:     Extraocular Movements:  Extraocular movements intact.  Cardiovascular:     Rate and Rhythm: Normal rate and regular rhythm.     Heart sounds: Normal heart sounds.  Pulmonary:     Effort: Pulmonary effort is normal.     Breath sounds: Normal breath sounds.  Abdominal:     Palpations: Abdomen is soft.  Musculoskeletal:        General: Normal range of motion.  Skin:    General: Skin is warm.  Neurological:     Mental Status: She is alert and oriented to person, place, and time.  Psychiatric:        Behavior: Behavior normal.     Imaging: CT ABDOMEN WO CONTRAST  Result Date: 09/04/2022 CLINICAL DATA:  anatomy evaluation for g-tube placement EXAM: CT ABDOMEN WITHOUT CONTRAST TECHNIQUE: Multidetector CT imaging of the abdomen was performed following the standard protocol without IV contrast. RADIATION DOSE REDUCTION: This exam was performed according to the departmental dose-optimization program which includes automated exposure control, adjustment of the mA and/or kV according to patient size and/or use of iterative reconstruction technique. COMPARISON:  Chest XR, 09/03/2022.  CTA neck, 09/03/2022 FINDINGS: Suboptimal evaluation, secondary to motion degradation. Lower chest: No acute abnormality.  Coronary atherosclerosis  Hepatobiliary: No focal liver abnormality is seen. No gallstones, gallbladder wall thickening, or biliary dilatation. Pancreas: No pancreatic ductal dilatation or surrounding inflammatory changes. Spleen: Normal in size without focal abnormality. Adrenals/Urinary Tract: Adrenal glands are unremarkable. Kidneys are normal, without renal calculi, focal lesion, or hydronephrosis. Stomach/Bowel: Stomach is within normal limits. No intervening viscera. Anatomy amenable for percutaneous gastrostomy. Nonobstructed small bowel. Nondilated imaged portions of colon. Vascular/Lymphatic: Aortic atherosclerosis without aneurysmal dilation. No enlarged abdominal lymph nodes. Other: No abdominal wall hernia or abnormality. Musculoskeletal: No acute or significant osseous findings. IMPRESSION: 1. No intervening viscera. Anatomy amenable for percutaneous gastrostomy. 2. Aortic Atherosclerosis (ICD10-I70.0). Additional incidental, chronic and senescent findings as above. Electronically Signed   By: Michaelle Birks M.D.   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 Multiplanar CT image reconstructions and MIPs were obtained to evaluate the vascular anatomy. Carotid stenosis measurements (when applicable) are obtained utilizing NASCET criteria, using the distal internal carotid diameter as the denominator. RADIATION DOSE REDUCTION: This exam was performed according to the departmental dose-optimization program which includes automated exposure control, adjustment of the mA and/or kV according to patient size and/or use of iterative reconstruction technique. CONTRAST:  47m OMNIPAQUE IOHEXOL 350 MG/ML SOLN COMPARISON:  No prior CT head or CTA head and neck; correlation  is made with 09/01/2022 MRI head and CT neck 08/30/2022 FINDINGS: CT HEAD FINDINGS Brain: No evidence of acute infarct, hemorrhage, mass, mass effect, or midline shift. No hydrocephalus or extra-axial fluid collection. Vascular: No hyperdense vessel. Skull: Hyperostosis frontalis. Negative for fracture or focal lesion. Sinuses/Orbits: No acute finding. Other: The mastoid air cells are well aerated. CTA NECK FINDINGS Aortic arch: Evaluation is somewhat limited by beam hardening artifact from adjacent contrast bolus. Within this limitation, there is standard branching. Imaged portion shows no evidence of aneurysm or dissection. No significant stenosis of the major arch vessel origins. Right carotid system: No evidence of dissection, occlusion, or hemodynamically significant stenosis (greater than 50%). Atherosclerotic disease at the bifurcation and in the proximal  ICA is not hemodynamically significant. Left carotid system: No evidence of dissection, occlusion, or hemodynamically significant stenosis (greater than 50%). Vertebral arteries: Moderate stenosis at the origin of the right vertebral artery and in the right V 1 segment (series 11, image 253). No other significant stenosis in the vertebral arteries. No evidence of dissection or occlusion. Skeleton: No acute osseous abnormality.  Edentulous. Other neck: Redemonstrated asymmetric enlargement of the right tongue. Upper chest: Unchanged 5 mm subsolid nodule in the left upper lobe, as seen on the 08/30/2022 neck CT. No additional focal pulmonary opacity or pleural effusion. Review of the MIP images confirms the above findings CTA HEAD FINDINGS Anterior circulation: Both internal carotid arteries are patent to the termini, without significant stenosis. A1 segments patent. Normal anterior communicating artery. Anterior cerebral arteries are patent to their distal aspects. No M1 stenosis or occlusion. MCA branches perfused and symmetric. Posterior circulation:  Vertebral arteries patent to the vertebrobasilar junction without stenosis. Posterior inferior cerebellar arteries patent proximally. Basilar patent to its distal aspect. Superior cerebellar arteries patent proximally. Patent P1 segments. PCAs perfused to their distal aspects without stenosis. The right posterior communicating artery is patent. Venous sinuses: As permitted by contrast timing, patent. Anatomic variants: None significant. Review of the MIP images confirms the above findings IMPRESSION: 1. No acute intracranial process. 2. Moderate stenosis at the origin of the right vertebral artery and in the right V1 segment. No other significant stenosis in the neck. 3. No intracranial large vessel occlusion or significant stenosis. 4. Redemonstrated asymmetric enlargement of the right tongue. 5. Unchanged 5 mm subsolid nodule in the left upper lobe, as seen on the 08/30/2022 neck CT. As before, a dedicated chest CT with contrast is recommended for further evaluation. Electronically Signed   By: Merilyn Baba M.D.   On: 09/04/2022 02:03   DG CHEST PORT 1 VIEW  Result Date: 09/03/2022 CLINICAL DATA:  Aspiration EXAM: PORTABLE CHEST 1 VIEW COMPARISON:  Chest x-ray dated August 30, 2022 FINDINGS: The heart size and mediastinal contours are within normal limits. Both lungs are clear. The visualized skeletal structures are unremarkable. IMPRESSION: No active disease. Electronically Signed   By: Yetta Glassman M.D.   On: 09/03/2022 10:38   MR BRAIN W WO CONTRAST  Result Date: 09/02/2022 CLINICAL DATA:  Initial evaluation for neuro deficit, stroke suspected. Difficulty swallowing. EXAM: MRI HEAD WITHOUT AND WITH CONTRAST TECHNIQUE: Multiplanar, multiecho pulse sequences of the brain and surrounding structures were obtained without and with intravenous contrast. CONTRAST:  63m GADAVIST GADOBUTROL 1 MMOL/ML IV SOLN COMPARISON:  Comparison made with prior MRI of the neck from 09/01/2023. FINDINGS: Brain: Cerebral  volume within normal limits for age. Patchy T2/FLAIR hyperintensity involving the periventricular and deep white matter both cerebral hemispheres, most characteristic of chronic microvascular ischemic disease, moderately advanced in nature. Remote lacunar infarct present at the posterior left lentiform nucleus. No abnormal foci of restricted diffusion to suggest acute or subacute ischemia. Specifically, no acute or recent medullary infarct to explain the findings on recent MRI of the neck. Gray-white matter differentiation otherwise maintained. No acute or chronic intracranial blood products. No mass lesion, midline shift or mass effect. No hydrocephalus or extra-axial fluid collection. Pituitary gland and suprasellar region within normal limits. No abnormal enhancement. Vascular: Major intracranial vascular flow voids are maintained. Skull and upper cervical spine: Craniocervical junction within normal limits. Bone marrow signal intensity within normal limits. No scalp soft tissue abnormality. Sinuses/Orbits: Patient status post ocular lens replacement on the right. Globes normal  soft tissues demonstrate no acute finding. Mild scattered mucosal thickening noted about the ethmoidal air cells. Paranasal sinuses are otherwise clear. Trace bilateral mastoid effusions, of doubtful significance. Other: None. IMPRESSION: 1. No acute intracranial abnormality. Specifically, no visible acute or recent medullary infarct to explain the findings on recent MRI of the neck. 2. Moderately advanced chronic microvascular ischemic disease with a remote lacunar infarct involving the left lentiform nucleus. Electronically Signed   By: Jeannine Boga M.D.   On: 09/02/2022 06:37   DG Swallowing Func-Speech Pathology  Result Date: 09/01/2022 Table formatting from the original result was not included. Images from the original result were not included. Objective Swallowing Evaluation: Type of Study: MBS-Modified Barium Swallow  Study  Patient Details Name: Sara [name] MRN: [id number] Date of Birth: 08-29-1941 Today's Date: 09/01/2022 Time: SLP Start Time (ACUTE ONLY): 1305 -SLP Stop Time (ACUTE ONLY): 1345 SLP Time Calculation (min) (ACUTE ONLY): 40 min Past Medical History: Past Medical History: Diagnosis Date  Allergy   Dizziness 02/21/2018  Estrogen deficiency 01/13/2018  Need for prophylactic vaccination against Streptococcus pneumoniae (pneumococcus) 01/13/2018  Right knee pain 05/29/2019  Routine general medical examination at a health care facility 01/13/2018  Seasonal allergies 06/21/2020  Vaginal itching 06/21/2020  Vision changes 01/13/2018 Past Surgical History: Past Surgical History: Procedure Laterality Date  DG THUMB RIGHT HAND ([facility] HX) Right   EYE SURGERY    TUBAL LIGATION   HPI: Sara [name] is a 81 year old female presenting with progressive dysphagia, weight loss, and AKI. Pertinent PMH/PSH includes CKD 3, type 2 diabetes, hypertension.  ENT consult 11/3 with no specific findings: " Oropharyngeal walls were symmetric and mobile without lesion, mass, or edema.  Hypopharynx was also without  lesion or edema.  Larynx was mobile without lesions.  No lesions or asymmetry in the supraglottic larynx.  Arytenoid mucosa was normal.  Posterior commissure with edema and redundant mucosa.  True vocal folds were pale yellow and without mass or lesion.  Her tongue base was slightly more prominent on the right side.  However, no mucosal ulceration or other abnormality was noted." Dr.Teoh found no definable mass on CT or MRI;  recommended consideration of neurology consultation.  Ms. Rotundo c/o food "not going down." She is unable to swallow her saliva and tends to expectorate it most of the time; she reports better of ease of swallowing of liquids than solids.  No data recorded  Recommendations for follow up therapy are one component of a multi-disciplinary discharge planning process, led by the attending physician.  Recommendations  may be updated based on patient status, additional functional criteria and insurance authorization. Assessment / Plan / Recommendation   09/01/2022   3:00 PM Clinical Impressions Clinical Impression Pt presents with a primary pharyngeal dysphagia marked by significant difficulty transferring material through her pharynx.  There was impaired laryngeal elevation, poor pharyngeal contraction/stripping, reduced and incomplete epiglottic inversion, reduced base-of-tongue contact to pharyngeal wall at the onset of the swallow.  All of these deficits led to barium (thin, nectar, and puree) sitting in the hypopharynx, requiring multiple attempts by pt to repetitively but ineffectivly swallow to pass material through UES. Continuous breaks were required so that she could expectorate residuals.  There was eventual spillage of barium into the larynx, reaching the level of the vocal folds. There was a secondary oral component to her dysphagia with difficulty propelling the bolus into the pharynx. However, she may have also developed some compensatory and maladaptive behaviors in an effort to better control  the quantity that enters her pharynx. The quality of her swallow is consistent with a neurological etiology; recommend consideration of MRI and/or neuro consult. Recommend she continue full liquids as able, but she will likely need temporary enteral feeding until a plan is determined for disposition and follow-up. Spoke with Dr. Sabra Heck regarding findings. SLP Visit Diagnosis Dysphagia, oropharyngeal phase (R13.12) Impact on safety and function Moderate aspiration risk     09/01/2022   3:00 PM Treatment Recommendations Treatment Recommendations Therapy as outlined in treatment plan below     09/01/2022   3:00 PM Prognosis Prognosis for Safe Diet Advancement Fair   09/01/2022   3:00 PM Diet Recommendations SLP Diet Recommendations Other (Comment) Liquid Administration via Cup;Straw Medication Administration Crushed with puree  Compensations Follow solids with liquid     09/01/2022   3:00 PM Other Recommendations Oral Care Recommendations Oral care BID Follow Up Recommendations Other (comment) Assistance recommended at discharge PRN Functional Status Assessment Patient has had a recent decline in their functional status and demonstrates the ability to make significant improvements in function in a reasonable and predictable amount of time.   09/01/2022   3:00 PM Frequency and Duration  Speech Therapy Frequency (ACUTE ONLY) min 2x/week Treatment Duration 2 weeks     09/01/2022   3:00 PM Oral Phase Oral Phase Impaired Oral - Puree Weak lingual manipulation;Reduced posterior propulsion    09/01/2022   3:00 PM Pharyngeal Phase Pharyngeal Phase Impaired Pharyngeal- Nectar Straw Delayed swallow initiation-vallecula;Delayed swallow initiation-pyriform sinuses;Reduced pharyngeal peristalsis;Reduced epiglottic inversion;Reduced laryngeal elevation;Reduced tongue base retraction;Penetration/Aspiration before swallow;Pharyngeal residue - valleculae;Pharyngeal residue - pyriform Pharyngeal- Thin Cup Delayed swallow initiation-vallecula;Delayed swallow initiation-pyriform sinuses;Reduced pharyngeal peristalsis;Reduced epiglottic inversion;Reduced laryngeal elevation;Reduced tongue base retraction;Penetration/Aspiration before swallow;Pharyngeal residue - valleculae;Pharyngeal residue - pyriform Pharyngeal Material enters airway, CONTACTS cords and not ejected out Pharyngeal- Thin Straw Delayed swallow initiation-vallecula;Delayed swallow initiation-pyriform sinuses;Reduced pharyngeal peristalsis;Reduced epiglottic inversion;Reduced laryngeal elevation;Reduced tongue base retraction;Penetration/Aspiration before swallow;Pharyngeal residue - valleculae;Pharyngeal residue - pyriform Pharyngeal Material enters airway, CONTACTS cords and not ejected out     No data to display    Juan Quam Laurice 09/01/2022, 3:25 PM                     MR NECK SOFT  TISSUE ONLY W WO CONTRAST  Result Date: 09/01/2022 CLINICAL DATA:  Initial evaluation for question tongue mass, not well visualized on CT. EXAM: MRI OF THE NECK WITH CONTRAST TECHNIQUE: Multiplanar, multisequence MR imaging was performed following the administration of intravenous contrast. CONTRAST:  57m GADAVIST GADOBUTROL 1 MMOL/ML IV SOLN COMPARISON:  Prior CT from 08/30/2022. FINDINGS: Pharynx and larynx: The right oral tongue is mildly increased in size as compared to the left. Associated STIR signal intensity consistent with edema. Changes are fairly well demarcated involving the right aspect of the oral tongue, with no crossing of the midline (series 8, image 25). No discrete mass about the oral tongue or base of tongue overall appearance is most typical of evolving acute denervation changes. Given this, close attention is paid to the expected course of the right hypoglossal nerve. No abnormality seen about the visualized medulla. No abnormality at the ventral medulla at the expected location of the hypo glossal cisternal segment. No mass lesion or other structural abnormality at the hypoglossal canal or along the right carotid sheath. Slight asymmetry of the right palatine tonsil as compared to the left without identifiable mass (series 9, image 26). Presumably this is not significant as the patient underwent fiberoptic laryngoscopy  by ENT earlier today. The oropharynx and nasopharynx are otherwise unremarkable. No retropharyngeal collection or swelling. Epiglottis within normal limits. Right aspect of the vallecula is partially effaced by the swollen right tongue. Hypopharynx and supraglottic larynx are symmetric without abnormality. Glottis symmetric without abnormality. Visualized subglottic airway is clear. Salivary glands: Salivary glands including the parotid and submandibular glands are within normal limits. No visible abnormality about the sublingual glands or sublingual space. Thyroid: Thyroid  within normal limits. Lymph nodes: Mildly prominent 8 mm right level IV node noted (series 8, image 45). No enlarged or pathologic adenopathy within the neck. Vascular: Major vascular flow voids are preserved within the neck. Limited intracranial: Visualized portions of the brain demonstrate no acute or significant finding. Visualized orbits: Patient status post ocular lens replacement on the right. Globes and orbital soft tissues otherwise unremarkable. Mastoids and visualized paranasal sinuses: Mild mucoperiosteal thickening present about the ethmoidal air cells and maxillary sinuses. Paranasal sinuses are otherwise clear. Trace fluid signal intensity noted within the inferior mastoid air cells bilaterally, of doubtful significance. Skeleton: Normal signal intensity seen within the visualized bone marrow. No focal marrow replacing lesion. Mild for age cervical spondylosis without significant spinal stenosis. Upper chest: Visualized upper chest demonstrates no acute finding. Other: None. IMPRESSION: 1. Well-demarcated asymmetric enlargement and edema involving the right aspect of the oral tongue without discrete mass. Overall appearance is most typical of evolving acute denervation changes. Finding is of uncertain etiology, as no mass lesion or other structural abnormality is seen along the expected course of the right hypoglossal nerve. Given the patient history of diabetes and hypertension, query whether this reflects post ischemic changes. Correlation with dedicated brain MRI could be performed for further evaluation, although the presence of a visible stroke may be difficult/limited given the patient's symptomatology over 1 month. Clinical follow-up to ensure normal expected progressive atrophy of the oral tongue is suggested. 2. No other acute abnormality within the neck. Electronically Signed   By: Jeannine Boga M.D.   On: 09/01/2022 05:10   DG Skull 1-3 Views  Result Date: 08/31/2022 CLINICAL  DATA:  Screening for MRI. EXAM: SKULL - 1-3 VIEW COMPARISON:  None Available. FINDINGS: There is no evidence of skull fracture or other focal bone lesions. No metallic densities are noted. IMPRESSION: No metallic densities are noted. No definite abnormality seen in the skull. Electronically Signed   By: Marijo Conception M.D.   On: 08/31/2022 11:52   CT Soft Tissue Neck Wo Contrast  Result Date: 08/30/2022 CLINICAL DATA:  Dysphagia. EXAM: CT NECK WITHOUT CONTRAST TECHNIQUE: Multidetector CT imaging of the neck was performed following the standard protocol without intravenous contrast. RADIATION DOSE REDUCTION: This exam was performed according to the departmental dose-optimization program which includes automated exposure control, adjustment of the mA and/or kV according to patient size and/or use of iterative reconstruction technique. COMPARISON:  None Available. FINDINGS: Pharynx and larynx: Asymmetry of the tongue. There is a larger volume of tissue in the tongue on the right than the left particularly posteriorly at the tongue base. No definite mass in the area however mucosal evaluation limited without intravenous contrast. Remainder of the pharynx normal.  Larynx normal.  Airway intact. Salivary glands: No inflammation, mass, or stone. Thyroid: Normal Lymph nodes: No enlarged lymph nodes in the neck. Vascular: Limited vascular evaluation without intravenous contrast. Limited intracranial: Negative Visualized orbits: Negative Mastoids and visualized paranasal sinuses: Paranasal sinuses clear. Mastoid clear. Skeleton: Mild cervical spondylosis.  No acute skeletal abnormality. Upper chest:  5.5 mm sub solid nodule left upper lobe. Right upper lobe clear Other: None IMPRESSION: 1. Tongue asymmetry. Tongue volume is greater in the right tongue base than the left tongue base. Question prior surgery. Possible atrophy of the posterior tongue on the left versus enlargement of the right tongue base. Lack of intravenous  contrast limits evaluation for mucosal lesion. Direct mucosal evaluation recommended to exclude mass lesion. 2. No enlarged lymph nodes in the neck 3. 5.5 mm sub solid nodule left upper lobe. Recommend complete chest CT with contrast. Electronically Signed   By: Franchot Gallo M.D.   On: 08/30/2022 16:10   DG Chest 2 View  Result Date: 08/30/2022 CLINICAL DATA:  Chest congestion. EXAM: CHEST - 2 VIEW COMPARISON:  None Available. FINDINGS: The heart size and mediastinal contours are within normal limits. Both lungs are clear. The visualized skeletal structures are unremarkable. IMPRESSION: No active cardiopulmonary disease. Electronically Signed   By: Marijo Conception M.D.   On: 08/30/2022 13:46    Labs:  CBC: Recent Labs    08/30/22 1336 08/31/22 0337  WBC 6.5 6.9  HGB 13.5 12.6  HCT 42.4 40.1  PLT 315 248    COAGS: No results for input(s): "INR", "APTT" in the last 8760 hours.  BMP: Recent Labs    09/01/22 0433 09/02/22 0227 09/03/22 0244 09/04/22 0317  NA 142 142 141 140  K 4.1 4.0 3.8 3.6  CL 115* 111 113* 107  CO2 20* 21* 21* 24  GLUCOSE 79 81 71 77  BUN 42* _0 CALCIUM 9.9 10.2 9.7 10.0  CREATININE 1.17* 0.95 0.89 0.92  GFRNONAA 47* >60 >60 >60    LIVER FUNCTION TESTS: Recent Labs    08/30/22 1336 08/31/22 0337  BILITOT 0.9 1.1  AST 29 25  ALT 25 24  ALKPHOS 96 76  PROT 9.0* 7.7  ALBUMIN 4.4 3.9    TUMOR MARKERS: No results for input(s): "AFPTM", "CEA", "CA199", "CHROMGRNA" in the last 8760 hours.  Assessment and Plan:  Tongue mass Dysphagia Wt loss Scheduled for percutaneous gastric tube placement Risks and benefits image guided gastrostomy tube placement was discussed with the patient including, but not limited to the need for a barium enema during the procedure, bleeding, infection, peritonitis and/or damage to adjacent structures.  All of the patient's questions were answered, patient is agreeable to proceed.  Consent signed and in  chart.  Thank you for this interesting consult.  I greatly enjoyed meeting SISSI PADIA and look forward to participating in their care.  A copy of this report was sent to the requesting provider on this date.  Electronically Signed: Lavonia Drafts, PA-C 09/04/2022, 2:11 PM   I spent a total of 20 Minutes    in face to face in clinical consultation, greater than 50% of which was counseling/coordinating care for percutaneous gastric tube placement

## 2022-09-05 ENCOUNTER — Inpatient Hospital Stay (HOSPITAL_COMMUNITY): Payer: Medicare HMO

## 2022-09-05 DIAGNOSIS — R1311 Dysphagia, oral phase: Secondary | ICD-10-CM | POA: Diagnosis not present

## 2022-09-05 HISTORY — PX: IR GASTROSTOMY TUBE MOD SED: IMG625

## 2022-09-05 LAB — GLUCOSE, CAPILLARY
Glucose-Capillary: 107 mg/dL — ABNORMAL HIGH (ref 70–99)
Glucose-Capillary: 128 mg/dL — ABNORMAL HIGH (ref 70–99)
Glucose-Capillary: 89 mg/dL (ref 70–99)
Glucose-Capillary: 95 mg/dL (ref 70–99)

## 2022-09-05 MED ORDER — ONDANSETRON HCL 4 MG/2ML IJ SOLN
INTRAMUSCULAR | Status: AC | PRN
  Administered 2022-09-05: 4 mg via INTRAVENOUS

## 2022-09-05 MED ORDER — FENTANYL CITRATE (PF) 100 MCG/2ML IJ SOLN
INTRAMUSCULAR | Status: AC | PRN
  Administered 2022-09-05: 25 ug via INTRAVENOUS

## 2022-09-05 MED ORDER — LIDOCAINE HCL 1 % IJ SOLN
INTRAMUSCULAR | Status: AC
  Filled 2022-09-05: qty 20

## 2022-09-05 MED ORDER — ONDANSETRON HCL 4 MG/2ML IJ SOLN: 4.0000 mg | INTRAMUSCULAR | Status: DC | PRN

## 2022-09-05 MED ORDER — ONDANSETRON HCL 4 MG/2ML IJ SOLN
INTRAMUSCULAR | Status: AC
  Filled 2022-09-05: qty 2

## 2022-09-05 MED ORDER — MIDAZOLAM HCL 2 MG/2ML IJ SOLN
INTRAMUSCULAR | Status: AC
  Filled 2022-09-05: qty 2

## 2022-09-05 MED ORDER — FENTANYL CITRATE (PF) 100 MCG/2ML IJ SOLN
INTRAMUSCULAR | Status: AC
  Filled 2022-09-05: qty 2

## 2022-09-05 MED ORDER — HYDROMORPHONE HCL 1 MG/ML IJ SOLN
0.5000 mg | Freq: Once | INTRAMUSCULAR | Status: AC
  Administered 2022-09-05: 0.5 mg via INTRAVENOUS
  Filled 2022-09-05: qty 0.5

## 2022-09-05 MED ORDER — MIDAZOLAM HCL 2 MG/2ML IJ SOLN
INTRAMUSCULAR | Status: AC | PRN
  Administered 2022-09-05: .5 mg via INTRAVENOUS

## 2022-09-05 MED ORDER — HYDROMORPHONE HCL 1 MG/ML IJ SOLN
0.5000 mg | INTRAMUSCULAR | Status: DC | PRN
  Administered 2022-09-06: 0.5 mg via INTRAVENOUS
  Filled 2022-09-05: qty 0.5

## 2022-09-05 MED ORDER — CEFAZOLIN SODIUM-DEXTROSE 2-4 GM/100ML-% IV SOLN
INTRAVENOUS | Status: AC | PRN
  Administered 2022-09-05: 2 g via INTRAVENOUS

## 2022-09-05 MED ORDER — HYDROCODONE-ACETAMINOPHEN 5-325 MG PO TABS
1.0000 | ORAL_TABLET | ORAL | Status: DC | PRN
  Administered 2022-09-06: 1 via ORAL
  Filled 2022-09-05: qty 1

## 2022-09-05 MED ORDER — GLUCAGON HCL RDNA (DIAGNOSTIC) 1 MG IJ SOLR
INTRAMUSCULAR | Status: AC | PRN
  Administered 2022-09-05: .5 mg via INTRAVENOUS

## 2022-09-05 MED ORDER — GLUCAGON HCL RDNA (DIAGNOSTIC) 1 MG IJ SOLR
INTRAMUSCULAR | Status: AC
  Filled 2022-09-05: qty 1

## 2022-09-05 NOTE — Progress Notes (Signed)
FMTS Interim Progress Note  Went to see the patient s/p PEG tube.  She is resting comfortably and doing well after procedure.  She reports that she is in moderate pain around PEG tube site.  Ordered 0.5 mg Dilaudid now  PRN 0.5 mg Dilaudid at bedtime per patient request.  Spoke with patient and daughter about initiating PEG tube feeding tomorrow.  Reported there is a high risk for refeeding syndrome that we will monitor.  Patient and family understand that this may be a barrier to discharge.  Darci Current, DO 09/05/2022, 4:52 PM PGY-1, [facility] Service pager [phone]

## 2022-09-05 NOTE — Procedures (Signed)
  Procedure:  Percutaneous gastrostomy catheter placement  71fPreprocedure diagnosis: The primary encounter diagnosis was AKI (acute kidney [place]) [place]. Diagnoses of Dysphagia, unspecified type and Tongue mass were also pertinent to this visit.  Postprocedure diagnosis: same EBL:    minimal Complications:   none immediate  See full dictation in [facility]'s  DDillard CannonMD Main # [phone]  [phone] [phone]

## 2022-09-05 NOTE — Assessment & Plan Note (Addendum)
Tube feeds at goal, will advance to bolus feeds so patient can get used to expected home regimen. No signs of refeeding syndrome on labs. - Nutrition following, appreciate recs - Continue to trend refeeding labs (BMP, Mg, Ph) - Continue SSI

## 2022-09-05 NOTE — Progress Notes (Signed)
     Daily Progress Note Intern Pager: [phone]  Patient name: Sara [name] Medical record number: [id number] Date of birth: [birth year] Age: 81 y.o. Gender: female  Primary Care Provider: Erskine Emery, MD Consultants: IR, Neurology, ENT Code Status: Full code  Pt Overview and Major Events to Date:  11/2: Admitted to FMTS 11/8: PEG tube planned today   Assessment and Plan:  Sara [name] is a 81 year old female presenting with progressive dysphagia, weight loss, and AKI. Pertinent PMH/PSH includes CKD 3, type 2 diabetes, hypertension.      * Oral phase dysphagia Dysphagia most likely related to complication of type 2 diabetes.  Patient remains only on thin liquid diet.  PEG tube planned for today.  With poor p.o. intake for the last month, when tube feeds begin, will need to monitor for refeeding syndrome. - ASA for CVA prophylaxis, can start in OP - Neurology s/o, follow-up outpatient - SLP following, follow-up outpatient - PEG tube pending, IR consulted  HTN (hypertension) Continues to be hypertensive but patient unable to take oral medications with dysphagia.  - Monitor BP  - Consider IV antihypertensives if BP >220/110  Type 2 diabetes mellitus without complication, with long-term current use of insulin (HCC) Home meds: metformin, insulin (22 Units daily), and invokana.  CBG today ranging in 70s-80s. Holding insulin as CBGs have been stable.  - CBGs q6h  - Consider very sensitive SSI if CBGs severely elevated - Holding home metformin, invokana, and insulin   FEN/GI: Thin liquids PPx: SCDs Dispo:Home  pending PEG tube . Barriers include ongoing medical management.   Subjective:  No acute events overnight.  Patient resting comfortably this morning.  She reports no increased work of breathing or shortness of breath.    Objective: Temp:  [98 F (36.7 C)-98.8 F (37.1 C)] 98.4 F (36.9 C) (11/08 0751) Pulse Rate:  [82-87] 85 (11/08 0751) Resp:  [16-18] 18  (11/08 0751) BP: (138-168)/(66-82) 160/77 (11/08 0751) SpO2:  [99 %-100 %] 99 % (11/08 0751) Physical Exam: General: Well-appearing, thin,  Cardiovascular: No acute distress regular regular rate and rhythm, no murmurs on exam Respiratory: Clear, no wheezing, no increased work of breathing Abdomen: Soft, nontender, nondistended Extremities: No peripheral edema  Laboratory: Most recent CBC Lab Results  Component Value Date   WBC 6.9 08/31/2022   HGB 12.6 08/31/2022   HCT 40.1 08/31/2022   MCV 83.9 08/31/2022   PLT 248 08/31/2022   Most recent BMP    Latest Ref Rng & Units 09/04/2022    3:17 AM  BMP  Glucose 70 - 99 mg/dL 77   BUN 8 - 23 mg/dL 13   Creatinine 0.44 - 1.00 mg/dL 0.92   Sodium 135 - 145 mmol/L 140   Potassium 3.5 - 5.1 mmol/L 3.6   Chloride 98 - 111 mmol/L 107   CO2 22 - 32 mmol/L 24   Calcium 8.9 - 10.3 mg/dL 10.0    Darci Current, DO 09/05/2022, 8:41 AM  PGY-1, [place] Intern pager: [phone], text pages welcome Secure chat group [facility]

## 2022-09-05 NOTE — Progress Notes (Signed)
Mobility Specialist Progress Note:   09/05/22 1219  Mobility  Activity Ambulated with assistance in hallway  Level of Assistance Contact guard assist, steadying assist  Assistive Device None  Distance Ambulated (ft) 400 ft  Activity Response Tolerated well  Mobility Referral Yes  $Mobility charge 1 Mobility   Pt received in bed and agreeable. C/o lightheadedness upon standing and feeling tired. Pt left in bed with all needs met, call bell in reach, and family in room.   Kaitlyn Wall Mobility Specialist-Acute Rehab Secure Chat only  

## 2022-09-06 DIAGNOSIS — R1311 Dysphagia, oral phase: Secondary | ICD-10-CM | POA: Diagnosis not present

## 2022-09-06 DIAGNOSIS — E43 Unspecified severe protein-calorie malnutrition: Secondary | ICD-10-CM | POA: Insufficient documentation

## 2022-09-06 DIAGNOSIS — Z931 Gastrostomy status: Secondary | ICD-10-CM | POA: Diagnosis not present

## 2022-09-06 LAB — BASIC METABOLIC PANEL
Anion gap: 5 (ref 5–15)
Anion gap: 8 (ref 5–15)
Anion gap: 8 (ref 5–15)
BUN: 7 mg/dL — ABNORMAL LOW (ref 8–23)
BUN: 7 mg/dL — ABNORMAL LOW (ref 8–23)
BUN: 7 mg/dL — ABNORMAL LOW (ref 8–23)
CO2: 25 mmol/L (ref 22–32)
CO2: 21 mmol/L — ABNORMAL LOW (ref 22–32)
CO2: 23 mmol/L (ref 22–32)
Calcium: 8.9 mg/dL (ref 8.9–10.3)
Calcium: 8.7 mg/dL — ABNORMAL LOW (ref 8.9–10.3)
Calcium: 8.6 mg/dL — ABNORMAL LOW (ref 8.9–10.3)
Chloride: 109 mmol/L (ref 98–111)
Chloride: 112 mmol/L — ABNORMAL HIGH (ref 98–111)
Chloride: 109 mmol/L (ref 98–111)
Creatinine, Ser: 1.03 mg/dL — ABNORMAL HIGH (ref 0.44–1.00)
Creatinine, Ser: 0.82 mg/dL (ref 0.44–1.00)
Creatinine, Ser: 0.8 mg/dL (ref 0.44–1.00)
GFR, Estimated: 55 mL/min — ABNORMAL LOW (ref >60)
GFR, Estimated: >60 mL/min (ref >60)
GFR, Estimated: >60 mL/min (ref >60)
Glucose, Bld: 185 mg/dL — ABNORMAL HIGH (ref 70–99)
Glucose, Bld: 76 mg/dL (ref 70–99)
Glucose, Bld: 110 mg/dL — ABNORMAL HIGH (ref 70–99)
Potassium: 3.2 mmol/L — ABNORMAL LOW (ref 3.5–5.1)
Potassium: 3.9 mmol/L (ref 3.5–5.1)
Potassium: 4 mmol/L (ref 3.5–5.1)
Sodium: 139 mmol/L (ref 135–145)
Sodium: 141 mmol/L (ref 135–145)
Sodium: 140 mmol/L (ref 135–145)

## 2022-09-06 LAB — CBC
HCT: 35.9 % — ABNORMAL LOW (ref 36.0–46.0)
Hemoglobin: 11.5 g/dL — ABNORMAL LOW (ref 12.0–15.0)
MCH: 26.6 pg (ref 26.0–34.0)
MCHC: 32 g/dL (ref 30.0–36.0)
MCV: 82.9 fL (ref 80.0–100.0)
Platelets: 134 10*3/uL — ABNORMAL LOW (ref 150–400)
RBC: 4.33 MIL/uL (ref 3.87–5.11)
RDW: 14.6 % (ref 11.5–15.5)
WBC: 8.8 10*3/uL (ref 4.0–10.5)
nRBC: 0 % (ref 0.0–0.2)

## 2022-09-06 LAB — GLUCOSE, CAPILLARY
Glucose-Capillary: 107 mg/dL — ABNORMAL HIGH (ref 70–99)
Glucose-Capillary: 166 mg/dL — ABNORMAL HIGH (ref 70–99)
Glucose-Capillary: 174 mg/dL — ABNORMAL HIGH (ref 70–99)
Glucose-Capillary: 204 mg/dL — ABNORMAL HIGH (ref 70–99)
Glucose-Capillary: 65 mg/dL — ABNORMAL LOW (ref 70–99)
Glucose-Capillary: 68 mg/dL — ABNORMAL LOW (ref 70–99)
Glucose-Capillary: 81 mg/dL (ref 70–99)
Glucose-Capillary: 96 mg/dL (ref 70–99)

## 2022-09-06 LAB — MAGNESIUM
Magnesium: 1.3 mg/dL — ABNORMAL LOW (ref 1.7–2.4)
Magnesium: 1.8 mg/dL (ref 1.7–2.4)
Magnesium: 1.6 mg/dL — ABNORMAL LOW (ref 1.7–2.4)

## 2022-09-06 LAB — PHOSPHORUS
Phosphorus: 2.1 mg/dL — ABNORMAL LOW (ref 2.5–4.6)
Phosphorus: 1.9 mg/dL — ABNORMAL LOW (ref 2.5–4.6)
Phosphorus: 2.5 mg/dL (ref 2.5–4.6)

## 2022-09-06 MED ORDER — GLUCERNA 1.2 CAL PO LIQD
1000.0000 mL | ORAL | Status: DC
  Administered 2022-09-06 – 2022-09-10 (×2): 1000 mL
  Filled 2022-09-06 (×6): qty 1000

## 2022-09-06 MED ORDER — THIAMINE MONONITRATE 100 MG PO TABS
100.0000 mg | ORAL_TABLET | Freq: Every day | ORAL | Status: DC
  Administered 2022-09-06 – 2022-09-10 (×5): 100 mg
  Filled 2022-09-06 (×4): qty 1

## 2022-09-06 MED ORDER — POTASSIUM PHOSPHATES 15 MMOLE/5ML IV SOLN
15.0000 mmol | Freq: Once | INTRAVENOUS | Status: AC
  Administered 2022-09-06: 15 mmol via INTRAVENOUS
  Filled 2022-09-06: qty 5

## 2022-09-06 MED ORDER — POTASSIUM PHOSPHATES 15 MMOLE/5ML IV SOLN: 30.0000 mmol | Freq: Once | INTRAVENOUS | Status: DC

## 2022-09-06 MED ORDER — INSULIN ASPART 100 UNIT/ML IJ SOLN
0.0000 [IU] | INTRAMUSCULAR | Status: DC
  Administered 2022-09-06 – 2022-09-08 (×2): 2 [IU] via SUBCUTANEOUS
  Administered 2022-09-08: 1 [IU] via SUBCUTANEOUS
  Administered 2022-09-09: 2 [IU] via SUBCUTANEOUS
  Administered 2022-09-09 – 2022-09-10 (×4): 1 [IU] via SUBCUTANEOUS
  Administered 2022-09-10: 2 [IU] via SUBCUTANEOUS

## 2022-09-06 MED ORDER — MAGNESIUM SULFATE 2 GM/50ML IV SOLN
2.0000 g | Freq: Once | INTRAVENOUS | Status: AC
  Administered 2022-09-06: 2 g via INTRAVENOUS
  Filled 2022-09-06: qty 50

## 2022-09-06 MED ORDER — THIAMINE MONONITRATE 100 MG PO TABS
100.0000 mg | ORAL_TABLET | Freq: Every day | ORAL | Status: DC
  Filled 2022-09-06: qty 1

## 2022-09-06 MED ORDER — SODIUM CHLORIDE 0.9 % IV SOLN: INTRAVENOUS | Status: DC

## 2022-09-06 MED ORDER — HYDROCODONE-ACETAMINOPHEN 5-325 MG PO TABS
1.0000 | ORAL_TABLET | ORAL | Status: DC | PRN
  Administered 2022-09-07 – 2022-09-10 (×2): 1
  Filled 2022-09-06: qty 1
  Filled 2022-09-06: qty 2

## 2022-09-06 MED ORDER — POTASSIUM CHLORIDE 10 MEQ/100ML IV SOLN
10.0000 meq | INTRAVENOUS | Status: DC
  Administered 2022-09-06 (×4): 10 meq via INTRAVENOUS
  Filled 2022-09-06 (×4): qty 100

## 2022-09-06 NOTE — Progress Notes (Signed)
Referring Physician(s): Dr. Sabra Heck  Supervising Physician: Mir, Sharen Heck  Patient Status:  [facility] - In-pt  Chief Complaint: Dysphagia s/p gastrostomy tube in IR 09/06/22  Subjective: Patient in bed watching TV. She feels ok with the exception of her IV site burning from potassium administration. The nurse has been called regarding this matter.   Allergies: Patient has no known allergies.  Medications: Prior to Admission medications   Medication Sig Start Date End Date Taking? Authorizing Provider  atorvastatin (LIPITOR) 40 MG tablet Take 1 tablet (40 mg total) by mouth daily. 03/05/22  Yes Brimage, Ronnette Juniper, DO  canagliflozin (INVOKANA) 300 MG TABS tablet Take 1 tablet (300 mg total) by mouth daily before breakfast. 03/05/22  Yes Brimage, Vondra, DO  cetirizine (ZYRTEC) 10 MG tablet Take 1 tablet (10 mg total) by mouth daily as needed for allergies. 08/24/22  Yes Sowell, Erlene Quan, MD  diclofenac Sodium (VOLTAREN) 1 % GEL Apply 2 g topically 4 (four) times daily. Patient taking differently: Apply 2 g topically daily as needed (For pain). 04/06/21  Yes Milus Banister C, DO  gabapentin (NEURONTIN) 100 MG capsule Take 1 capsule (100 mg total) by mouth at bedtime. 10/05/21  Yes Brimage, Vondra, DO  insulin glargine (LANTUS SOLOSTAR) 100 UNIT/ML Solostar Pen Inject 18 Units into the skin daily. Patient taking differently: Inject 22 Units into the skin daily. 08/16/22  Yes Erskine Emery, MD  losartan (COZAAR) 50 MG tablet Take 1 tablet (50 mg total) by mouth at bedtime. 07/30/22  Yes Erskine Emery, MD  metFORMIN (GLUCOPHAGE) 500 MG tablet TAKE 2 TABLETS BY MOUTH TWICE DAILY WITH A MEAL Patient taking differently: Take 500 mg by mouth 2 (two) times daily with a meal. 08/21/22  Yes Maxwell, Allee, MD  Accu-Chek Softclix Lancets lancets Please use to check blood sugar up to 4 times daily. 08/21/22   Erskine Emery, MD  blood glucose meter kit and supplies KIT Dispense based on patient and insurance  preference. Use up to four times daily as directed. 08/24/22   Holley Bouche, MD  glucose blood test strip 1 each by Other route 4 (four) times daily - after meals and at bedtime. 08/23/22   Erskine Emery, MD  Insulin Pen Needle (PEN NEEDLES 3/16") 31G X 5 MM MISC Use as instructed to inject insulin once daily 10/05/21   Lyndee Hensen, DO     Vital Signs: BP 107/65 (BP Location: Left Arm)   Pulse 80   Temp 97.8 F (36.6 C) (Oral)   Resp 15   Ht 5' 1" (1.549 m)   Wt 109 lb (49.4 kg)   SpO2 100%   BMI 20.60 kg/m   Physical Exam Constitutional:      General: She is not in acute distress.    Appearance: She is not ill-appearing.  Abdominal:     Palpations: Abdomen is soft.     Tenderness: There is abdominal tenderness.     Comments: G-tube in place. Dressing is clean/dry. Patient states the nurses have been using it for medication administration without any difficulties. Dressing is clean/dry. Site is tender to palpation.   Skin:    General: Skin is warm and dry.  Neurological:     Mental Status: She is alert.     Imaging: IR GASTROSTOMY TUBE MOD SED  Result Date: 09/05/2022 CLINICAL DATA:  Dysphagia, needs enteral feeding support EXAM: PERC PLACEMENT GASTROSTOMY FLUOROSCOPY: Radiation Exposure Index (as provided by the fluoroscopic device): 14 mGy air Kerma TECHNIQUE: The procedure, risks, benefits, and  alternatives were explained to the patient. Questions regarding the procedure were encouraged and answered. The patient understands and consents to the procedure. As antibiotic prophylaxis, cefazolin 2 g was ordered pre-procedure and administered intravenously within one hour of incision. A safe percutaneous approach was confirmed on recent CT. A 5 French angiographic catheter was placed as orogastric tube. The upper abdomen was prepped with Betadine, draped in usual sterile fashion, and infiltrated locally with 1% lidocaine. Intravenous Fentanyl 6mg and Versed 0.51mwere  administered as conscious sedation during continuous monitoring of the patient's level of consciousness and physiological / cardiorespiratory status by the radiology RN, with a total moderate sedation time of 16 minutes. 0.5 mg glucagon given IV. Stomach was insufflated using air through the orogastric tube. An 1865rench sheath needle was advanced percutaneously into the gastric lumen under fluoroscopy. Gas could be aspirated and a small contrast injection confirmed intraluminal spread. The sheath was exchanged over a guidewire for a 9 Fr[place] sheath, through which the snare device was advanced and used to snare a guidewire passed through the orogastric tube. This was withdrawn, and the snare attached to the 20 French pull-through gastrostomy tube, which was advanced antegrade, positioned with the internal bumper securing the anterior gastric wall to the anterior abdominal wall. Small contrast injection confirms appropriate positioning. The external bumper was applied and the catheter was flushed. COMPLICATIONS: COMPLICATIONS none IMPRESSION: 1. Technically successful 20 French pull-through gastrostomy placement under fluoroscopy. Electronically Signed   By: D Lucrezia Europe.D.   On: 09/05/2022 16:34   CT ABDOMEN WO CONTRAST  Result Date: 09/04/2022 CLINICAL DATA:  anatomy evaluation for g-tube placement EXAM: CT ABDOMEN WITHOUT CONTRAST TECHNIQUE: Multidetector CT imaging of the abdomen was performed following the standard protocol without IV contrast. RADIATION DOSE REDUCTION: This exam was performed according to the departmental dose-optimization program which includes automated exposure control, adjustment of the mA and/or kV according to patient size and/or use of iterative reconstruction technique. COMPARISON:  Chest XR, 09/03/2022.  CTA neck, 09/03/2022 FINDINGS: Suboptimal evaluation, secondary to motion degradation. Lower chest: No acute abnormality.  Coronary atherosclerosis Hepatobiliary: No focal  liver abnormality is seen. No gallstones, gallbladder wall thickening, or biliary dilatation. Pancreas: No pancreatic ductal dilatation or surrounding inflammatory changes. Spleen: Normal in size without focal abnormality. Adrenals/Urinary Tract: Adrenal glands are unremarkable. Kidneys are normal, without renal calculi, focal lesion, or hydronephrosis. Stomach/Bowel: Stomach is within normal limits. No intervening viscera. Anatomy amenable for percutaneous gastrostomy. Nonobstructed small bowel. Nondilated imaged portions of colon. Vascular/Lymphatic: Aortic atherosclerosis without aneurysmal dilation. No enlarged abdominal lymph nodes. Other: No abdominal wall hernia or abnormality. Musculoskeletal: No acute or significant osseous findings. IMPRESSION: 1. No intervening viscera. Anatomy amenable for percutaneous gastrostomy. 2. Aortic Atherosclerosis (ICD10-I70.0). Additional incidental, chronic and senescent findings as above. Electronically Signed   By: JoMichaelle Birks.D.   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 Multiplanar CT image reconstructions and MIPs were obtained to evaluate the vascular anatomy. Carotid stenosis measurements (when applicable) are obtained utilizing NASCET criteria, using the distal internal carotid diameter as the denominator. RADIATION DOSE REDUCTION: This exam was performed according to the departmental dose-optimization program which includes automated exposure control, adjustment of the mA and/or kV according to patient size  and/or use of iterative reconstruction technique. CONTRAST:  17m OMNIPAQUE IOHEXOL 350 MG/ML SOLN COMPARISON:  No prior CT head or CTA head and neck; correlation is made with 09/01/2022  MRI head and CT neck 08/30/2022 FINDINGS: CT HEAD FINDINGS Brain: No evidence of acute infarct, hemorrhage, mass, mass effect, or midline shift. No hydrocephalus or extra-axial fluid collection. Vascular: No hyperdense vessel. Skull: Hyperostosis frontalis. Negative for fracture or focal lesion. Sinuses/Orbits: No acute finding. Other: The mastoid air cells are well aerated. CTA NECK FINDINGS Aortic arch: Evaluation is somewhat limited by beam hardening artifact from adjacent contrast bolus. Within this limitation, there is standard branching. Imaged portion shows no evidence of aneurysm or dissection. No significant stenosis of the major arch vessel origins. Right carotid system: No evidence of dissection, occlusion, or hemodynamically significant stenosis (greater than 50%). Atherosclerotic disease at the bifurcation and in the proximal ICA is not hemodynamically significant. Left carotid system: No evidence of dissection, occlusion, or hemodynamically significant stenosis (greater than 50%). Vertebral arteries: Moderate stenosis at the origin of the right vertebral artery and in the right V 1 segment (series 11, image 253). No other significant stenosis in the vertebral arteries. No evidence of dissection or occlusion. Skeleton: No acute osseous abnormality.  Edentulous. Other neck: Redemonstrated asymmetric enlargement of the right tongue. Upper chest: Unchanged 5 mm subsolid nodule in the left upper lobe, as seen on the 08/30/2022 neck CT. No additional focal pulmonary opacity or pleural effusion. Review of the MIP images confirms the above findings CTA HEAD FINDINGS Anterior circulation: Both internal carotid arteries are patent to the termini, without significant stenosis. A1 segments patent. Normal anterior communicating artery. Anterior cerebral arteries are patent to their distal aspects. No M1 stenosis or occlusion. MCA branches perfused and symmetric. Posterior circulation: Vertebral arteries patent to  the vertebrobasilar junction without stenosis. Posterior inferior cerebellar arteries patent proximally. Basilar patent to its distal aspect. Superior cerebellar arteries patent proximally. Patent P1 segments. PCAs perfused to their distal aspects without stenosis. The right posterior communicating artery is patent. Venous sinuses: As permitted by contrast timing, patent. Anatomic variants: None significant. Review of the MIP images confirms the above findings IMPRESSION: 1. No acute intracranial process. 2. Moderate stenosis at the origin of the right vertebral artery and in the right V1 segment. No other significant stenosis in the neck. 3. No intracranial large vessel occlusion or significant stenosis. 4. Redemonstrated asymmetric enlargement of the right tongue. 5. Unchanged 5 mm subsolid nodule in the left upper lobe, as seen on the 08/30/2022 neck CT. As before, a dedicated chest CT with contrast is recommended for further evaluation. Electronically Signed   By: AMerilyn BabaM.D.   On: 09/04/2022 02:03   DG CHEST PORT 1 VIEW  Result Date: 09/03/2022 CLINICAL DATA:  Aspiration EXAM: PORTABLE CHEST 1 VIEW COMPARISON:  Chest x-ray dated August 30, 2022 FINDINGS: The heart size and mediastinal contours are within normal limits. Both lungs are clear. The visualized skeletal structures are unremarkable. IMPRESSION: No active disease. Electronically Signed   By: LYetta GlassmanM.D.   On: 09/03/2022 10:38    Labs:  CBC: Recent Labs    08/30/22 1336 08/31/22 0337 09/06/22 0526  WBC 6.5 6.9 8.8  HGB 13.5 12.6 11.5*  HCT 42.4 40.1 35.9*  PLT 315 248 134*    COAGS: No results for input(s): "INR", "APTT" in the last 8760 hours.  BMP: Recent Labs    09/02/22 0227 09/03/22 0244 09/04/22 0317 09/06/22 0526  NA 142  141 140 139  K 4.0 3.8 3.6 3.2*  CL 111 113* 107 109  CO2 21* 21* 24 25  GLUCOSE 81 71 77 185*  BUN _0 7*  CALCIUM 10.2 9.7 10.0 8.9  CREATININE 0.95 0.89 0.92 1.03*   GFRNONAA >60 >60 >60 55*    LIVER FUNCTION TESTS: Recent Labs    08/30/22 1336 08/31/22 0337  BILITOT 0.9 1.1  AST 29 25  ALT 25 24  ALKPHOS 96 76  PROT 9.0* 7.7  ALBUMIN 4.4 3.9    Assessment and Plan:  Dysphagia s/p gastrostomy tube in IR 09/06/22  Patient is afebrile and without leukocytosis. G-tube is functioning as expected. Ok to use tube for feeds.   Please call IR with any questions.   Electronically Signed: Soyla Dryer, AGACNP-BC [phone] 09/06/2022, 2:17 PM   I spent a total of 15 Minutes at the the patient's bedside AND on the patient's hospital floor or unit, greater than 50% of which was counseling/coordinating care s/p gastrostomy tube placement.

## 2022-09-06 NOTE — Progress Notes (Signed)
     Daily Progress Note Intern Pager: [phone]  Patient name: Sara [name] Medical record number: [id number] Date of birth: July 31, 1941 Age: 81 y.o. Gender: female  Primary Care Provider: Erskine Emery, MD Consultants: IR, Neurology, ENT, Nutrition  Code Status: Full Code  Pt Overview and Major Events to Date:  11/2: Admitted  11/8: s/p PEG tube   Assessment and Plan:  Sara [name] is a 81 year old female presenting with progressive dysphagia, weight loss, and AKI. Pertinent PMH/PSH includes CKD 3, type 2 diabetes, hypertension.    * Oral phase dysphagia Dysphagia most likely caused by long standing type 2 diabetes. S/P PEG tube 11/8.  With poor p.o. intake for the last month, will need to monitor for refeeding syndrome. - ASA for CVA prophylaxis, can start in OP - Neurology s/o, follow-up outpatient - SLP following, follow-up outpatient - nutrition consulted  - add on sensitive sliding scale insulin  - daily phos, BMP, Mag ordered; may need to increase labs to BID pending electrolyte abnormalities   HTN (hypertension) BP stable overnight. If pressures continue to increase throughout the day can start back BP meds tomorrow per tube.  - Monitor BP  - Consider IV antihypertensives if BP >220/110  Type 2 diabetes mellitus without complication, with long-term [name] use of insulin (HCC) Holding home meds: metformin, insulin (22 Units daily), and invokana.  - CBGs q6h  - Add sensitive SSI in anticipation of tube feeds   FEN/GI: clear liquids, tube feeds starting today  PPx: SCDs, patient very mobile  Dispo:Home pending clinical improvement . Barriers include ongoing medical management.   Subjective:  No acute events overnight.  Patient resting comfortably this morning.  She denies being in pain.  Tube feeds are expected to start today.  Patient understands risk for refeeding syndrome.  Objective: Temp:  [97.8 F (36.6 C)-99.8 F (37.7 C)] 97.8 F (36.6 C)  (11/09 0727) Pulse Rate:  [80-101] 80 (11/09 0727) Resp:  [13-20] 15 (11/09 0727) BP: (107-190)/(65-87) 107/65 (11/09 0727) SpO2:  [98 %-100 %] 100 % (11/09 0727) Physical Exam: General: Well-appearing, thin, no acute distress Cardiovascular: Regular rate, regular rhythm, no murmurs on exam Respiratory: Clear, no wheezing, no increased work of breathing Abdomen: Soft, nontender, nondistended, PEG tube in place with dressing intact and clean Extremities: No peripheral edema  Laboratory: Most recent CBC Lab Results  Component Value Date   WBC 8.8 09/06/2022   HGB 11.5 (L) 09/06/2022   HCT 35.9 (L) 09/06/2022   MCV 82.9 09/06/2022   PLT 134 (L) 09/06/2022   Most recent BMP    Latest Ref Rng & Units 09/06/2022    5:26 AM  BMP  Glucose 70 - 99 mg/dL 185   BUN 8 - 23 mg/dL 7   Creatinine 0.44 - 1.00 mg/dL 1.03   Sodium 135 - 145 mmol/L 139   Potassium 3.5 - 5.1 mmol/L 3.2   Chloride 98 - 111 mmol/L 109   CO2 22 - 32 mmol/L 25   Calcium 8.9 - 10.3 mg/dL 8.9    Sara Current, DO 09/06/2022, 7:44 AM  PGY-1, [place] Intern pager: [phone], text pages welcome Secure chat group [place]

## 2022-09-06 NOTE — Progress Notes (Signed)
Mobility Specialist: Progress Note   09/06/22 1159  Mobility  Activity Ambulated with assistance in hallway  Level of Assistance Modified independent, requires aide device or extra time  Assistive Device Other (Comment) (IV pole)  Distance Ambulated (ft) 250 ft  Activity Response Tolerated well  Mobility Referral Yes  $Mobility charge 1 Mobility   Received pt in bed having no complaints and agreeable to mobility. Pt was asymptomatic throughout ambulation and returned to room w/o fault. Left in bed w/ call bell in reach and all needs met.  [name] Mobility Specialist Please contact via SecureChat or Rehab office at [phone]

## 2022-09-06 NOTE — Progress Notes (Signed)
Initial Nutrition Assessment  DOCUMENTATION CODES:  Severe malnutrition in context of acute illness/injury  INTERVENTION:  When cleared for use by IR recommend the following: Initiate tube feeding via PEG: Glucerna 1.2 at 55 ml/h (1320 ml per day) Start at 73m/h and advance by 180mq12h to goal of 5524m Free water flush 6m53mh Provides 1584 kcal, 79 gm protein, 1603 ml free water daily Pt is at risk for refeeding. Monitor Mg, phosphorus, and K and replace until stable (minimum of 48 hours) Thiamine '100mg'$  x 14 days for refeeding risk Continue liquid diet as tolerated  NUTRITION DIAGNOSIS:   Severe Malnutrition (in the context of acute illness) related to dysphagia, poor appetite as evidenced by moderate fat depletion, moderate muscle depletion, energy intake < or equal to 50% for > or equal to 5 days.  GOAL:   Patient will meet greater than or equal to 90% of their needs  MONITOR:   PO intake, TF tolerance, I & O's, Labs, Weight trends  REASON FOR ASSESSMENT:   Consult Enteral/tube feeding initiation and management  ASSESSMENT:   Pt with hx of CKD3, DM type 2, and HTN presented to ED with worsening dysphagia, weight loss, and dehydration. Pt noted to have tongue swelling, imaging showed a mass.  11/4 - MBS, full liquids as able 11/8 - PEG placed by IR   Pt resting in bed at the time of assessment. Pt reports feeling well this AM, eager to start feeds as she is hungry. Reports little to no intake x1 month and a 20lb weight loss during this time. Discussed TF regimen and starting slow feeds this afternoon once Radiologist comes by to check on her tube. Encouraged pt to continue sipping on liquids as she desired. No questions from pt at this time.  Discussed TF regimen with RN. Feeds to be started at a tirckle and advanced slowly to monitor for refeeding. Labs ordered to assess. Added thiamine to assist with refeeding risk.    Average Meal Intake: 11/3: 25% intake x 1  recorded meals  Nutritionally Relevant Medications: Scheduled Meds:  insulin aspart  0-9 Units Subcutaneous Q4H   Continuous Infusions:  dextrose 5 % and 0.9% NaCl 75 mL/hr at 09/05/22 1520   magnesium sulfate bolus IVPB     potassium chloride 10 mEq (09/06/22 0853)   PRN Meds: ondansetron  Labs Reviewed: K 3.2 BUN 7, creatinine 1.03 Mg 1.3 CBG ranges from 89-204 mg/dL over the last 24 hours HgbA1c 7.3% (10/02)  NUTRITION - FOCUSED PHYSICAL EXAM: Flowsheet Row Most Recent Value  Orbital Region Moderate depletion  Upper Arm Region Mild depletion  Thoracic and Lumbar Region Mild depletion  Buccal Region Moderate depletion  Temple Region Moderate depletion  Clavicle Bone Region Moderate depletion  Clavicle and Acromion Bone Region Severe depletion  Scapular Bone Region Moderate depletion  Dorsal Hand Mild depletion  Patellar Region Moderate depletion  Anterior Thigh Region Moderate depletion  Posterior Calf Region Mild depletion  Edema (RD Assessment) None  Hair Reviewed  Eyes Reviewed  Mouth Reviewed  Skin Reviewed  Nails Reviewed   Diet Order:   Diet Order             Diet clear liquid Room service appropriate? Yes; Fluid consistency: Thin  Diet effective now                   EDUCATION NEEDS:  Education needs have been addressed  Skin:  Skin Assessment: Reviewed RN Assessment  Last BM:  unsure  Height:  Ht Readings from Last 1 Encounters:  09/03/22 '5\' 1"'$  (1.549 m)    Weight:  Wt Readings from Last 1 Encounters:  09/03/22 49.4 kg    Ideal Body Weight:  47.7 kg  BMI:  Body mass index is 20.6 kg/m.  Estimated Nutritional Needs:  Kcal:  1500-1700 kcal/d Protein:  75-90g/d Fluid:  >/=1.5L/d    Ranell Patrick, RD, LDN Clinical Dietitian RD pager # available in [facility]  After hours/weekend pager # available in [facility]

## 2022-09-07 DIAGNOSIS — R1311 Dysphagia, oral phase: Secondary | ICD-10-CM | POA: Diagnosis not present

## 2022-09-07 LAB — BASIC METABOLIC PANEL
Anion gap: 4 — ABNORMAL LOW (ref 5–15)
BUN: 7 mg/dL — ABNORMAL LOW (ref 8–23)
CO2: 24 mmol/L (ref 22–32)
Calcium: 8.7 mg/dL — ABNORMAL LOW (ref 8.9–10.3)
Chloride: 111 mmol/L (ref 98–111)
Creatinine, Ser: 0.83 mg/dL (ref 0.44–1.00)
GFR, Estimated: >60 mL/min (ref >60)
Glucose, Bld: 114 mg/dL — ABNORMAL HIGH (ref 70–99)
Potassium: 3.7 mmol/L (ref 3.5–5.1)
Sodium: 139 mmol/L (ref 135–145)

## 2022-09-07 LAB — MAGNESIUM: Magnesium: 2 mg/dL (ref 1.7–2.4)

## 2022-09-07 LAB — GLUCOSE, CAPILLARY
Glucose-Capillary: 109 mg/dL — ABNORMAL HIGH (ref 70–99)
Glucose-Capillary: 107 mg/dL — ABNORMAL HIGH (ref 70–99)
Glucose-Capillary: 123 mg/dL — ABNORMAL HIGH (ref 70–99)
Glucose-Capillary: 133 mg/dL — ABNORMAL HIGH (ref 70–99)
Glucose-Capillary: 105 mg/dL — ABNORMAL HIGH (ref 70–99)
Glucose-Capillary: 120 mg/dL — ABNORMAL HIGH (ref 70–99)

## 2022-09-07 LAB — PHOSPHORUS: Phosphorus: 2.3 mg/dL — ABNORMAL LOW (ref 2.5–4.6)

## 2022-09-07 MED ORDER — K PHOS MONO-SOD PHOS DI & MONO 155-852-130 MG PO TABS
250.0000 mg | ORAL_TABLET | Freq: Two times a day (BID) | ORAL | Status: DC
  Administered 2022-09-07 – 2022-09-10 (×7): 250 mg
  Filled 2022-09-07 (×8): qty 1

## 2022-09-07 NOTE — Progress Notes (Signed)
     Daily Progress Note Intern Pager: [phone]  Patient name: Sara [name] Medical record number: [id number] Date of birth: [birth year] Age: 81 y.o. Gender: female  Primary Care Provider: Erskine Emery, MD Consultants: IR, neurology, ENT, nutrition Code Status: Full code  Pt Overview and Major Events to Date:  11/2: Admitted  11/8: s/p PEG tube   Assessment and Plan:  Sara [name] is a 81 year old female presenting with progressive dysphagia, weight loss, and AKI. Pertinent PMH/PSH includes CKD 3, type 2 diabetes, hypertension.     * Oral phase dysphagia Dysphagia most likely caused by long standing type 2 diabetes. S/P PEG tube 11/8.  With poor p.o. intake for the last month, will need to monitor for refeeding syndrome. Mag: 2.0 Phos: 2.3 K: 3.7 - ASA for CVA prophylaxis, can start in OP - nutrition consulted, appreciate recommendations - Continue SSI  - daily phos, BMP, Mag ordered; may need to increase labs to BID pending electrolyte abnormalities   HTN (hypertension) BP stable overnight. If pressures continue to increase throughout the day can start back BP meds tomorrow per tube.  - Monitor BP  - hold BP meds in the setting of low pressures   Type 2 diabetes mellitus without complication, with long-term current use of insulin (HCC) Holding home meds: metformin, insulin (22 Units daily), and invokana.  - CBGs q6h  - Cont SSI, sensitive    FEN/GI: clear liquids and tube feeds PPx: SCDs, mobile patient  Dispo:Home pending clinical improvement . Barriers include monitoring for refeeding syndrome.   Subjective:  No acute events overnight patient resting comfortably.  She is doing well with her tube feeds.  No new complaints.  Objective: Temp:  [98.4 F (36.9 C)-99.3 F (37.4 C)] 98.4 F (36.9 C) (11/10 0441) Pulse Rate:  [77-87] 77 (11/10 0441) Resp:  [15-18] 18 (11/10 0441) BP: (121-131)/(65-71) 121/65 (11/10 0441) SpO2:  [100 %] 100 % (11/10  0441) Physical Exam: General: Well-appearing, thin, no acute distress Cardiovascular: Regular rate, regular rhythm, no murmurs on exam Respiratory: Clear, no wheezing, no increased work of breathing Abdomen: Soft, nontender, nondistended Extremities: No peripheral edema  Laboratory: Most recent CBC Lab Results  Component Value Date   WBC 8.8 09/06/2022   HGB 11.5 (L) 09/06/2022   HCT 35.9 (L) 09/06/2022   MCV 82.9 09/06/2022   PLT 134 (L) 09/06/2022   Most recent BMP    Latest Ref Rng & Units 09/07/2022    3:21 AM  BMP  Glucose 70 - 99 mg/dL 114   BUN 8 - 23 mg/dL 7   Creatinine 0.44 - 1.00 mg/dL 0.83   Sodium 135 - 145 mmol/L 139   Potassium 3.5 - 5.1 mmol/L 3.7   Chloride 98 - 111 mmol/L 111   CO2 22 - 32 mmol/L 24   Calcium 8.9 - 10.3 mg/dL 8.7    Mag - 2.0 Phos - 2.3  Darci Current, DO 09/07/2022, 7:47 AM  PGY-1, [place] Intern pager: [phone], text pages welcome Secure chat group [facility] Teaching Service

## 2022-09-07 NOTE — Progress Notes (Signed)
Brief Nutrition Note  Pt had TF initiated via PEG 11/9 and so far reports good tolerance. Currently infusing at 56m/h and set to be advanced to 475m~4pm. Phosphorus low again on AM labs after being replaced yesterday afternoon. Attending replaced again this AM. Pt likely refeeding. Will continue slow advancement and monitoring of labs.   Once pt is tolerating full rate feeds can be transitioned over to bolus feeds for ease of administration at home. Would recommend adjusting to bolus feeds prior to dc home so that pt can practice administrating. Pt would benefit from Glucerna 1.2 formula as she has a hx of DM with most recent A1c 7.3% on 07/30/22.  Recommend the following regimen: Glucerna 1.2, 35065mx/d (~6 cartons per day) 2m18m free water before and after each bolus feed (120mL84md) This will provide 1680kcal, 84g of protein, and 1607mL 49mree water  RachelRanell PatrickLDN Clinical Dietitian RD pager # available in [facility] hours/weekend pager # available in [facility]

## 2022-09-07 NOTE — Plan of Care (Signed)
0700:Pt resting in bed at beginning of shift, pt alert and oriented x4, pt on tube feeds continuously, pt independent and ambulatory. Pt call bell within reach and bed in lowest position.   1700:Pt tube feeds increased to 69m/hr

## 2022-09-07 NOTE — Progress Notes (Signed)
Speech Language Pathology Treatment: Dysphagia  Patient Details Name: BRENTNEY GOLDBACH MRN: [id number] DOB: 10-Jan-1941 Today's Date: 09/07/2022 Time: 3267-1245 SLP Time Calculation (min) (ACUTE ONLY): 10 min  Assessment / Plan / Recommendation Clinical Impression  Pt reports good tolerance of thin liquids, but did not take any sips under observation. Pt verbalizes understanding of Masako maneuver, but had difficulty executing it with verbal and tactile cues. Reinforced execution and therapy plan until pt starts with OP SLP. Will f/u while admitted, though d/c happening early next week.   HPI HPI: SOMARA FRYMIRE is a 81 year old female presenting with progressive dysphagia x one month, 20 lb. weight loss, and AKI. Pertinent PMH/PSH includes CKD 3, type 2 diabetes, hypertension. ENT consult 11/3 with no specific findings excluding "tongue base was slightly more prominent on the right side. However, no mucosal ulceration or other abnormality was noted." Dr.Teoh found no definable mass on CT or MRI; recommended consideration of neurology consultation. MR neck soft tissue: "Well-demarcated asymmetric enlargement and edema involving the right aspect of the oral tongue without discrete mass. Overall appearance is most typical of evolving acute denervation changes."  Ms. Panik c/o food "not going down." She is unable to swallow her saliva and tends to expectorate it most of the time; she reports better of ease of swallowing of liquids than solids.      SLP Plan  Continue with current plan of care      Recommendations for follow up therapy are one component of a multi-disciplinary discharge planning process, led by the attending physician.  Recommendations may be updated based on patient status, additional functional criteria and insurance authorization.    Recommendations  Diet recommendations: Thin liquid Liquids provided via: Straw Medication Administration: Crushed with puree Supervision:  Patient able to self feed                Oral Care Recommendations: Oral care BID Follow Up Recommendations: Outpatient SLP Assistance recommended at discharge: PRN SLP Visit Diagnosis: Dysphagia, oropharyngeal phase (R13.12) Plan: Continue with current plan of care           Johnston Maddocks, Katherene Ponto  09/07/2022, 10:25 AM

## 2022-09-07 NOTE — Progress Notes (Signed)
Mobility Specialist Progress Note:   09/07/22 0957  Mobility  Activity Ambulated with assistance in hallway  Level of Assistance Standby assist, set-up cues, supervision of patient - no hands on  Assistive Device Other (Comment) (IV Pole)  Distance Ambulated (ft) 400 ft  Activity Response Tolerated well  Mobility Referral Yes  $Mobility charge 1 Mobility   Pt received in bed and agreeable. No complaints. Pt left in bed with all needs met and call bell in reach.   Nivea Wojdyla Mobility Specialist-Acute Rehab Secure Chat only

## 2022-09-07 NOTE — TOC Progression Note (Signed)
Transition of Care [facility]) - Progression Note    Patient Details  Name: Sara [name] MRN: [id number] Date of Birth: [birth year]  Transition of Care [facility]) CM/SW [place], RN Phone Number: 09/07/2022, 11:04 AM  Clinical Narrative:    CM discussed with attending physician team / nutrition regarding tube feeding and/or need for tube feeding pump.  Patient is currently on tube feeding and nutritionist will coordinate needs for home.  Approval given to contact [employer] company to follow the patient for Enteral feeding needs and/or equipment.  I called and spoke with Carolynn Sayers, RNCM with [employer] and referral was placed.  The patient was updated and aware. I spoke with the patient and gave her Medicare choice regarding home health company and she did not have a preference.  I called [facility] and spoke with Tommi Rumps, [facility] and he accepted the referral for home health RN.  CM will continue to follow the patient for discharge to home needs with the daughter - no discharge date at this time. Once patient is medically stable - she will discharge home with the daughter by car.   Expected Discharge Plan: [place] Barriers to Discharge: Continued Medical Work up  Expected Discharge Plan and Services Expected Discharge Plan: [place]   Discharge Planning Services: CM Consult   Living arrangements for the past 2 months: Single Family Home                 DME Arranged: Tube feeding pump, Tube feeding DME Agency: Other - Comment Carolynn Sayers, RNCM with [employer] company) Date DME Agency Contacted: 09/07/22 Time DME Agency Contacted: [id number] Representative spoke with at DME Agency: Carolynn Sayers, [place] with [employer] company HH Arranged: RN [place] Agency: [place] Date [facility] Agency Contacted: 09/07/22 Time [place]: 1103 Representative spoke with at [place]: Tommi Rumps, RNCM with [facility]   Social Determinants of  Health (SDOH) Interventions    Readmission Risk Interventions    09/03/2022    2:19 PM  Readmission Risk Prevention Plan  Post Dischage Appt Complete  Medication Screening Complete  Transportation Screening Complete

## 2022-09-08 DIAGNOSIS — R1311 Dysphagia, oral phase: Secondary | ICD-10-CM | POA: Diagnosis not present

## 2022-09-08 LAB — BASIC METABOLIC PANEL
Anion gap: 3 — ABNORMAL LOW (ref 5–15)
BUN: 9 mg/dL (ref 8–23)
CO2: 27 mmol/L (ref 22–32)
Calcium: 8.9 mg/dL (ref 8.9–10.3)
Chloride: 107 mmol/L (ref 98–111)
Creatinine, Ser: 0.77 mg/dL (ref 0.44–1.00)
GFR, Estimated: >60 mL/min (ref >60)
Glucose, Bld: 136 mg/dL — ABNORMAL HIGH (ref 70–99)
Potassium: 3.9 mmol/L (ref 3.5–5.1)
Sodium: 137 mmol/L (ref 135–145)

## 2022-09-08 LAB — GLUCOSE, CAPILLARY
Glucose-Capillary: 106 mg/dL — ABNORMAL HIGH (ref 70–99)
Glucose-Capillary: 110 mg/dL — ABNORMAL HIGH (ref 70–99)
Glucose-Capillary: 120 mg/dL — ABNORMAL HIGH (ref 70–99)
Glucose-Capillary: 128 mg/dL — ABNORMAL HIGH (ref 70–99)
Glucose-Capillary: 145 mg/dL — ABNORMAL HIGH (ref 70–99)
Glucose-Capillary: 191 mg/dL — ABNORMAL HIGH (ref 70–99)
Glucose-Capillary: 97 mg/dL (ref 70–99)
Glucose-Capillary: 98 mg/dL (ref 70–99)

## 2022-09-08 LAB — PHOSPHORUS: Phosphorus: 2.6 mg/dL (ref 2.5–4.6)

## 2022-09-08 LAB — MAGNESIUM: Magnesium: 1.8 mg/dL (ref 1.7–2.4)

## 2022-09-08 NOTE — Progress Notes (Signed)
     Daily Progress Note Intern Pager: [phone]  Patient name: Sara [name] Medical record number: [id number] Date of birth: 06/28/1941 Age: 81 y.o. Gender: female  Primary Care Provider: Erskine Emery, MD Consultants: IR, neurology, ENT, nutrition Code Status: Full  Pt Overview and Major Events to Date:  11/2 - Admitted 11/8 - PEG tube placed  Assessment and Plan: Sara [name] is a 81 y.o. female that presented with progressive dysphagia, weight loss, and AKI; is now s/p PEG tube placement. PMH significant for CKD stage 3, T2DM, HTN.  * Oral phase dysphagia Advancing tube feeds as able while monitoring for refeeding. Currently on continuous feeds and will transition to bolus feeds so patient can administer at home.  - Nutrition following, appreciate recs - Continue to trend refeeding labs - Continue SSI  HTN (hypertension) BP largely normotensive. Given recent borderline low blood pressures, will continue to hold home meds  - Holding BP - Continue to monitor BP  Type 2 diabetes mellitus without complication, with long-term current use of insulin (HCC) Holding home medications. Trending CBGs with tube feeds to determine insulin needs.  - sensitive SSI - CBGs q6h    FEN/GI: Tube feeds and CLD PPx: SCDs, patient mobile Dispo:Home pending clinical improvement . Barriers include progression of tube feeds and monitoring refeeding syndrome.   Subjective:  Patient reports no acute complaints at this time. She is feeling a bit better this morning overall and has been able to drink fluids.   Objective: Temp:  [97.9 F (36.6 C)-99 F (37.2 C)] 98.3 F (36.8 C) (11/11 0539) Pulse Rate:  [72-98] 72 (11/11 0539) Resp:  [15-18] 15 (11/11 0418) BP: (101-151)/(63-74) 128/70 (11/11 0539) SpO2:  [96 %-100 %] 100 % (11/11 0539) Weight:  [49.5 kg] 49.5 kg (11/11 0424) Physical Exam: General: well-appearing, elderly, thin female, NAD Cardiovascular: RRR, no murmur  appreciated Respiratory: CTAB, no increased WOB Abdomen: soft, non-tender, non-distended, PEG tube in place and area covered with gauze that is CDI Extremities: moving extremities equally, no peripheral edema  Laboratory: Most recent CBC Lab Results  Component Value Date   WBC 8.8 09/06/2022   HGB 11.5 (L) 09/06/2022   HCT 35.9 (L) 09/06/2022   MCV 82.9 09/06/2022   PLT 134 (L) 09/06/2022   Most recent BMP    Latest Ref Rng & Units 09/08/2022    2:59 AM  BMP  Glucose 70 - 99 mg/dL 136   BUN 8 - 23 mg/dL 9   Creatinine 0.44 - 1.00 mg/dL 0.77   Sodium 135 - 145 mmol/L 137   Potassium 3.5 - 5.1 mmol/L 3.9   Chloride 98 - 111 mmol/L 107   CO2 22 - 32 mmol/L 27   Calcium 8.9 - 10.3 mg/dL 8.9     Other pertinent labs Phos 2.8, Mag 1.8    Sara Fariss, DO 09/08/2022, 6:07 AM  PGY-3, [place] Intern pager: [phone], text pages welcome Secure chat group [facility] Teaching Service

## 2022-09-09 DIAGNOSIS — R1311 Dysphagia, oral phase: Secondary | ICD-10-CM | POA: Diagnosis not present

## 2022-09-09 LAB — BASIC METABOLIC PANEL
Anion gap: 6 (ref 5–15)
BUN: 12 mg/dL (ref 8–23)
CO2: 29 mmol/L (ref 22–32)
Calcium: 9.3 mg/dL (ref 8.9–10.3)
Chloride: 105 mmol/L (ref 98–111)
Creatinine, Ser: 0.73 mg/dL (ref 0.44–1.00)
GFR, Estimated: >60 mL/min (ref >60)
Glucose, Bld: 131 mg/dL — ABNORMAL HIGH (ref 70–99)
Potassium: 4.1 mmol/L (ref 3.5–5.1)
Sodium: 140 mmol/L (ref 135–145)

## 2022-09-09 LAB — PHOSPHORUS: Phosphorus: 2.7 mg/dL (ref 2.5–4.6)

## 2022-09-09 LAB — GLUCOSE, CAPILLARY
Glucose-Capillary: 125 mg/dL — ABNORMAL HIGH (ref 70–99)
Glucose-Capillary: 124 mg/dL — ABNORMAL HIGH (ref 70–99)
Glucose-Capillary: 146 mg/dL — ABNORMAL HIGH (ref 70–99)
Glucose-Capillary: 151 mg/dL — ABNORMAL HIGH (ref 70–99)
Glucose-Capillary: 96 mg/dL (ref 70–99)
Glucose-Capillary: 129 mg/dL — ABNORMAL HIGH (ref 70–99)

## 2022-09-09 LAB — MAGNESIUM: Magnesium: 1.7 mg/dL (ref 1.7–2.4)

## 2022-09-09 MED ORDER — METFORMIN HCL 500 MG PO TABS
500.0000 mg | ORAL_TABLET | Freq: Two times a day (BID) | ORAL | Status: DC
  Administered 2022-09-09 – 2022-09-10 (×2): 500 mg
  Filled 2022-09-09 (×2): qty 1

## 2022-09-09 MED ORDER — ATORVASTATIN CALCIUM 40 MG PO TABS
40.0000 mg | ORAL_TABLET | Freq: Every day | ORAL | Status: DC
  Administered 2022-09-09 – 2022-09-10 (×2): 40 mg
  Filled 2022-09-09 (×2): qty 1

## 2022-09-09 NOTE — Discharge Instructions (Addendum)
Dear Sara [name],  Thank you for letting us participate in your care. You were hospitalized for dysphagia and diagnosed with Oral phase dysphagia. You were treated with PEG tube placement. You were switched to bolus tube feeds, prior to discharge, this should be your feeding routine at home.    POST-HOSPITAL & CARE INSTRUCTIONS Go to your follow up appointments (listed below)   DOCTOR'S APPOINTMENT   No future appointments.  Follow-up Information     Erskine Emery, MD. Schedule an appointment as soon as possible for a visit.   Specialty: Family Medicine Contact information: [place] [address] [place], [place], DO. Schedule an appointment as soon as possible for a visit.   Specialty: Neurology Contact information: [place] [address] [phone]         [employer] Follow up.   Why: [employer] will provide Enteral tube feedings and supplies.        Care, [facility] Follow up.   Specialty: Home Health Services Why: [facility] will provide home health RN.  They will call you in the next 24-48 hours to schedule RN to set up visits to the home. Contact information: [place] [address] [phone]                 Take care and be well!  [facility]  [place], [place] [address] [phone]

## 2022-09-09 NOTE — Discharge Summary (Incomplete)
[facility] Discharge Summary  Patient name: Sara [name] Medical record number: [id number] Date of birth: 10-27-1941 Age: 81 y.o. Gender: female Date of Admission: 08/30/2022  Date of Discharge: 11/13 Admitting Physician: Sallee Provencal, MD  Primary Care Provider: Erskine Emery, MD Consultants: IR, neurology, ENT, nutrition   Indication for Hospitalization: Dysphagia  Discharge Diagnoses/Problem List:  Principal Problem for Admission:   Other Problems addressed during stay:  Principal Problem:   Oral phase dysphagia Active Problems:   Type 2 diabetes mellitus without complication, with long-term current use of insulin (HCC)   HTN (hypertension)   Stage 3a chronic kidney disease (CKD) ([place])   Tongue mass   AKI (acute kidney injury) ([place])   Dysphagia   Protein-calorie malnutrition, severe   Brief Hospital Course:  Sara [name] is a 81 y.o. female who presented with progressive dysphagia and AKI who was found to have no mass but abnormal swelling. PMH significant for CKD 3a, T2DM, and HTN. A brief hospital course is below.   Oral phase dysphagia  Tongue mass Presented for difficulty swallowing and 11 pound weight loss in 1 month.  She reports that she had this irregular mass by about 1 month ago.  CT head showed tiny asymmetry on the right.  ENT consulted and recommended MRI which showed asymmetric enlargement and edema involving respite of the oral tongue without discrete mass.  MRI brain showed no discrete stroke lesions.  Neurology was consulted and suspected this was due to a complication of her longstanding type 2 diabetes.  SLP obtained a swallowing function study which showed difficulty swallowing and high risk for aspiration. Family and patient agreed to PEG tube placement while she works with SLP outpatient to regain swallowing function.  PEG was placed 11/8. Patient tolerated tube feeds well and discharged home with outpatient  follow up with SLP and neurology.    AKI (acute kidney injury) ([place]) Creatinine 2.13 on admission, but improved with IV fluids.  Creatinine down to 0.73 by d/c/  HTN (hypertension) Hypertensive on admission to 171/86.  Restarted home losartan with good control of blood pressures.  Issues for follow up: 5.5 mm sub solid nodule left upper lobe. Recommend complete chest CT with contrast outpatient.  Restart canagliflozin in outpatient Metformin increased to 1000 mg BID at d/c, will need adjustment in outpatient Ensure patient follows up with neurology outpatient.  Losartan held, reassess and make adjustments as appropriate.  Held Invokana due to preventing hypoglycemia and adjusting to new tube feeds, reassess and restart as appropriate.   Disposition: Home  Discharge Condition: medically stable   Discharge Exam:  Vitals:   09/10/22 0344 09/10/22 0735  BP: 137/67 137/63  Pulse: 84 83  Resp: 18 18  Temp: 98.8 F (37.1 C) 98.4 F (36.9 C)  SpO2: 99% 100%   General: Well appearing, NAD, African American Woman, conversant/interactive Cardiovascular: RRR, S1/S2, NRMG Respiratory: CTABL, no wheezing or stridor, good WOB Abdomen: Soft, NTTP, PEG in place with bandage w/ mild ttp Extremities: Moving all extremities independently, minimal edema at edge of socks  Significant Procedures: PEG placed 09/05/22  Significant Labs and Imaging:  No results for input(s): "WBC", "HGB", "HCT", "PLT" in the last 48 hours. Recent Labs  Lab 09/09/22 0400 09/10/22 0206  NA 140 139  K 4.1 4.3  CL 105 102  CO2 29 29  GLUCOSE 131* 130*  BUN 12 13  CREATININE 0.73 0.74  CALCIUM 9.3 9.6  MG 1.7 1.7  PHOS 2.7  2.5    Pertinent Imaging   MR Neck Soft Tissue WWO contrast: Well-demarcated asymmetric enlargement and edema involving the right aspect of the oral tongue without discrete mass. Overall appearance is most typical of evolving acute denervation changes  Results/Tests Pending at Time of  Discharge: None  Discharge Medications:  Allergies as of 09/10/2022   No Known Allergies      Medication List     STOP taking these medications    canagliflozin 300 MG Tabs tablet Commonly known as: INVOKANA   gabapentin 100 MG capsule Commonly known as: NEURONTIN Replaced by: Gabapentin 25 MG Tabs   Lantus SoloStar 100 UNIT/ML Solostar Pen Generic drug: insulin glargine   losartan 50 MG tablet Commonly known as: COZAAR       TAKE these medications    Accu-Chek Softclix Lancets lancets Please use to check blood sugar up to 4 times daily.   atorvastatin 40 MG tablet Commonly known as: LIPITOR Place 1 tablet (40 mg total) into feeding tube daily. Start taking on: September 11, 2022 What changed: how to take this   blood glucose meter kit and supplies Kit Dispense based on patient and insurance preference. Use up to four times daily as directed.   cetirizine 10 MG tablet Commonly known as: ZYRTEC Take 1 tablet (10 mg total) by mouth daily as needed for allergies.   diclofenac Sodium 1 % Gel Commonly known as: Voltaren Apply 2 g topically 4 (four) times daily. What changed:  when to take this reasons to take this   feeding supplement (GLUCERNA 1.2 CAL) Liqd Place 1,000 mLs into feeding tube continuous.   Gabapentin 25 MG Tabs Give 100 mg by tube at bedtime. Replaces: gabapentin 100 MG capsule   glucose blood test strip 1 each by Other route 4 (four) times daily - after meals and at bedtime.   metFORMIN 500 MG tablet Commonly known as: GLUCOPHAGE Place 2 tablets (1,000 mg total) into feeding tube 2 (two) times daily with a meal. What changed: See the new instructions.   Pen Needles 3/16" 31G X 5 MM Misc Use as instructed to inject insulin once daily   phosphorus 155-852-130 MG tablet Commonly known as: K PHOS NEUTRAL Place 1 tablet (250 mg total) into feeding tube 2 (two) times daily.   thiamine 100 MG tablet Commonly known as: Vitamin B-1 Place  1 tablet (100 mg total) into feeding tube daily. Start taking on: September 11, 2022               Durable Medical Equipment  (From admission, onward)           Start     Ordered   09/09/22 1254  For home use only DME Tube feeding  Once       Comments: Recommend the following regimen:  Glucerna 1.2, 326m 4x/d (~6 cartons per day)  660mof free water before and after each bolus feed (12072mx/d)  This will provide 1680kcal, 84g of protein, and 1607m43m free water  Glucerna- Anticipate long term need for management of unstable blood sugars   09/09/22 1254   09/09/22 1240  For home use only DME Tube feeding pump  Once       Question:  Length of Need  Answer:  Lifetime   09/09/22 1239            Discharge Instructions: Please refer to Patient Instructions section of EMR for full details.  Patient was counseled important signs and symptoms that  should prompt return to medical care, changes in medications, dietary instructions, activity restrictions, and follow up appointments.   Follow-Up Appointments:  Follow-up Information     Erskine Emery, MD. Schedule an appointment as soon as possible for a visit on 09/17/2022.   Specialty: Family Medicine Why: Appointment at 1:50 pm, please arrive 15 min prior to your scheduled appointment time. Contact information: [place] [address] [phone]         Alda Berthold, DO. Schedule an appointment as soon as possible for a visit.   Specialty: Neurology Contact information: [place] [address] [phone]         [facility] Follow up.   Why: [facility] will provide Enteral tube feedings and supplies.        [facility] Follow up.   Specialty: Home Health Services Why: [facility] will provide home health RN.  They will call you in the next 24-48 hours to schedule RN to set up visits to the home. Contact information: [address] [place] [address] [phone]                 Alesia Morin, MD 09/10/2022, 1:55 PM PGY-1, [employer]  I was personally present and performed or re-performed the history, physical exam and medical decision making activities of this service and have verified that the service and findings are accurately documented in the intern's note. My edits are noted within the note above. Please also see attending's attestation.   Donney Dice, DO                  09/10/2022, 1:56 PM  PGY-3, [place]

## 2022-09-09 NOTE — Assessment & Plan Note (Signed)
Tube feeds at rate of 55 ml/Hr, will advance tube feeds to bolus feeding, in preparation for home regimen. Will continue to monitor for refeeding syndrome.  -Nutrition following, appreciate recs -Continue refeeding labs (BMP, Mg, Phos) -Continue SSI

## 2022-09-09 NOTE — TOC Progression Note (Signed)
Transition of Care [facility]) - Progression Note    Patient Details  Name: Sara [name] MRN: [id number] Date of Birth: June 19, 1941  [facility] CM/SW Contact  Carles Collet, RN Phone Number: 09/09/2022, 12:58 PM  Clinical Narrative:      Provider inquiring if feeds are set up for home. Verified feeds needed for home from RD note 11/10 w Dr Adah Salvage. Order placed for feeds, with Glucerna medically necessary statement as requested by [facility]. Order also placed for feeding pump at request of Dr Adah Salvage. Anticipate insurance auth obtained by [employer] for feeds Monday.  Notified Alvis Lemmings of anticipated DC Monday.  Expected Discharge Plan: [place] Barriers to Discharge: Continued Medical Work up  Expected Discharge Plan and Services Expected Discharge Plan: [place]   Discharge Planning Services: CM Consult   Living arrangements for the past 2 months: Single Family Home                 DME Arranged: Tube feeding pump, Tube feeding DME Agency: Other - Comment Carolynn Sayers, RNCM with [facility] DME company) Date DME Agency Contacted: 09/07/22 Time DME Agency Contacted: 2353 Representative spoke with at DME Agency: Carolynn Sayers, [place] with [facility] DME company HH Arranged: RN [place] Agency: [place] Date [facility] Agency Contacted: 09/07/22 Time [place]: 1103 Representative spoke with [place]: Tommi Rumps, RNCM with [facility]   Social Determinants of Health (SDOH) Interventions    Readmission Risk Interventions    09/03/2022    2:19 PM  Readmission Risk Prevention Plan  Post Dischage Appt Complete  Medication Screening Complete  Transportation Screening Complete

## 2022-09-09 NOTE — Progress Notes (Signed)
     Daily Progress Note Intern Pager: [phone]  Patient name: Sara [name] Medical record number: [id number] Date of birth: [birth year] Age: 81 y.o. Gender: female  Primary Care Provider: Erskine Emery, MD Consultants: IR, neurology, ENT, nutrition  Code Status: Full  Pt Overview and Major Events to Date:  11/2 - Admitted 11/8 - PEG tube placed    Assessment and Plan:  Sara [name] is a 81 y.o. female that presented with progressive dysphagia, weight loss, and AKI; is now s/p PEG tube placement. PMH significant for CKD stage 3, T2DM, HTN.    * Oral phase dysphagia Tube feeds at goal, will advance to bolus feeds so patient can get used to expected home regimen. No signs of refeeding syndrome on labs. - Nutrition following, appreciate recs - Continue to trend refeeding labs (BMP, Mg, Ph) - Continue SSI  HTN (hypertension) BP largely normotensive, some BP in 621'H systolic range. Will continue to hold home meds, and consider starting losartan 50 mg if BP remain's elevated. - Holding BP meds - Continue to monitor BP -Start atorvastatin 40 mg daily  Type 2 diabetes mellitus without complication, with long-term current use of insulin ([place]) Holding home medications. Trending CBGs with tube feeds to determine insulin needs. CBG range 97-191 with 3 units of short acting yesterday.  - sensitive SSI - CBGs q4h - restart metformin 500 mg BID   FEN/GI: Tube Feeds and CLD PPx: SCD's Dispo:Home pending clinical improvement . Barriers include progression of tube feeds and monitoring for refeeding syndrome.   Subjective:  Doing well this morning, no complaints  Objective: Temp:  [98.2 F (36.8 C)-98.4 F (36.9 C)] 98.2 F (36.8 C) (11/12 0746) Pulse Rate:  [75-84] 81 (11/12 0746) Resp:  [16-17] 16 (11/12 0746) BP: (133-148)/(63-79) 140/79 (11/12 0746) SpO2:  [99 %-100 %] 100 % (11/12 0746) Weight:  [49.3 kg] 49.3 kg (11/12 0345) Physical Exam: General: Well  appearing, NAD, African American Woman, conversant/interactive Cardiovascular: RRR, S1/S2, NRMG Respiratory: CTABL, no wheezing or stridor, good WOB Abdomen: Soft, NTTP, PEG in place with bandage w/ mild ttp Extremities: Moving all extremities independently, minimal edema at edge of socks  Laboratory: Most recent CBC Lab Results  Component Value Date   WBC 8.8 09/06/2022   HGB 11.5 (L) 09/06/2022   HCT 35.9 (L) 09/06/2022   MCV 82.9 09/06/2022   PLT 134 (L) 09/06/2022   Most recent BMP    Latest Ref Rng & Units 09/09/2022    4:00 AM  BMP  Glucose 70 - 99 mg/dL 131   BUN 8 - 23 mg/dL 12   Creatinine 0.44 - 1.00 mg/dL 0.73   Sodium 135 - 145 mmol/L 140   Potassium 3.5 - 5.1 mmol/L 4.1   Chloride 98 - 111 mmol/L 105   CO2 22 - 32 mmol/L 29   Calcium 8.9 - 10.3 mg/dL 9.3      Holley Bouche, MD 09/09/2022, 7:51 AM  PGY-2, [place] Intern pager: [phone], text pages welcome Secure chat group [place]

## 2022-09-10 DIAGNOSIS — R1311 Dysphagia, oral phase: Secondary | ICD-10-CM | POA: Diagnosis not present

## 2022-09-10 LAB — GLUCOSE, CAPILLARY
Glucose-Capillary: 127 mg/dL — ABNORMAL HIGH (ref 70–99)
Glucose-Capillary: 153 mg/dL — ABNORMAL HIGH (ref 70–99)
Glucose-Capillary: 107 mg/dL — ABNORMAL HIGH (ref 70–99)
Glucose-Capillary: 103 mg/dL — ABNORMAL HIGH (ref 70–99)

## 2022-09-10 LAB — BASIC METABOLIC PANEL
Anion gap: 8 (ref 5–15)
BUN: 13 mg/dL (ref 8–23)
CO2: 29 mmol/L (ref 22–32)
Calcium: 9.6 mg/dL (ref 8.9–10.3)
Chloride: 102 mmol/L (ref 98–111)
Creatinine, Ser: 0.74 mg/dL (ref 0.44–1.00)
GFR, Estimated: >60 mL/min (ref >60)
Glucose, Bld: 130 mg/dL — ABNORMAL HIGH (ref 70–99)
Potassium: 4.3 mmol/L (ref 3.5–5.1)
Sodium: 139 mmol/L (ref 135–145)

## 2022-09-10 LAB — MAGNESIUM: Magnesium: 1.7 mg/dL (ref 1.7–2.4)

## 2022-09-10 LAB — PHOSPHORUS: Phosphorus: 2.5 mg/dL (ref 2.5–4.6)

## 2022-09-10 MED ORDER — GLUCERNA 1.2 CAL PO LIQD: 1000.0000 mL | ORAL | 1 refills | Status: AC

## 2022-09-10 MED ORDER — ATORVASTATIN CALCIUM 40 MG PO TABS: 40.0000 mg | ORAL_TABLET | Freq: Every day | ORAL | 0 refills | Status: DC

## 2022-09-10 MED ORDER — VITAMIN B-1 100 MG PO TABS: 100.0000 mg | ORAL_TABLET | Freq: Every day | ORAL | 2 refills | Status: AC

## 2022-09-10 MED ORDER — GABAPENTIN 25 MG PO TABS: 100.0000 mg | ORAL_TABLET | Freq: Every day | ORAL | 0 refills | Status: DC

## 2022-09-10 MED ORDER — METFORMIN HCL 500 MG PO TABS: 1000.0000 mg | ORAL_TABLET | Freq: Two times a day (BID) | ORAL | 0 refills | Status: DC

## 2022-09-10 MED ORDER — K PHOS MONO-SOD PHOS DI & MONO 155-852-130 MG PO TABS: 250.0000 mg | ORAL_TABLET | Freq: Two times a day (BID) | ORAL | 2 refills | Status: AC

## 2022-09-10 NOTE — Progress Notes (Signed)
Mobility Specialist Progress Note   09/10/22 0920  Mobility  Activity Ambulated with assistance in hallway (in recliner before and after)  Level of Assistance Modified independent, requires aide device or extra time  Assistive Device Other (Comment) (IV pole)  Distance Ambulated (ft) 400 ft  Range of Motion/Exercises Active;All extremities  Activity Response Tolerated well   Patient received in recliner, agreeable to participate. Ambulated with slow steady gait. Returned to room without complaint or incident. Was left in recliner with all needs met, call bell in reach.   [place] Taedyn Glasscock, [place], [place]  Office: [phone]

## 2022-09-10 NOTE — Plan of Care (Signed)
Discharge instructions discussed with patient.  Patient instructed on home medications, restrictions, and follow up appointments. Belongings gathered and sent with patient.  Patients medications [facility] Patient discharged via wheelchair by this Probation officer.

## 2022-09-11 ENCOUNTER — Encounter: Payer: Self-pay | Admitting: Neurology

## 2022-09-11 ENCOUNTER — Telehealth: Payer: Self-pay

## 2022-09-11 DIAGNOSIS — R1310 Dysphagia, unspecified: Secondary | ICD-10-CM | POA: Diagnosis not present

## 2022-09-11 DIAGNOSIS — R634 Abnormal weight loss: Secondary | ICD-10-CM | POA: Diagnosis not present

## 2022-09-11 DIAGNOSIS — E1122 Type 2 diabetes mellitus with diabetic chronic kidney disease: Secondary | ICD-10-CM | POA: Diagnosis not present

## 2022-09-11 DIAGNOSIS — I129 Hypertensive chronic kidney disease with stage 1 through stage 4 chronic kidney disease, or unspecified chronic kidney disease: Secondary | ICD-10-CM | POA: Diagnosis not present

## 2022-09-11 DIAGNOSIS — N179 Acute kidney failure, unspecified: Secondary | ICD-10-CM | POA: Diagnosis not present

## 2022-09-11 DIAGNOSIS — Z431 Encounter for attention to gastrostomy: Secondary | ICD-10-CM | POA: Diagnosis not present

## 2022-09-11 DIAGNOSIS — N1831 Chronic kidney disease, stage 3a: Secondary | ICD-10-CM | POA: Diagnosis not present

## 2022-09-11 DIAGNOSIS — K148 Other diseases of tongue: Secondary | ICD-10-CM | POA: Diagnosis not present

## 2022-09-11 DIAGNOSIS — E785 Hyperlipidemia, unspecified: Secondary | ICD-10-CM | POA: Diagnosis not present

## 2022-09-11 DIAGNOSIS — R110 Nausea: Secondary | ICD-10-CM

## 2022-09-11 DIAGNOSIS — R112 Nausea with vomiting, unspecified: Secondary | ICD-10-CM

## 2022-09-11 NOTE — Patient Outreach (Signed)
  Care Coordination TOC Note Transition Care Management Follow-up Telephone Call Date of discharge and from where: [facility] 08/30/22-09/10/22 How have you been since you were released from the hospital? "I am doing pretty good". Any questions or concerns? Yes- Patients son reported patient vomited after receiving her tube feeding this morning.  He feels they gave the feeding too quickly.  Nurse from [place] expected any minute as her home so advised to discuss with the nurse ans she/he would contact her physician as needed.  Family may need more teaching in enteral feedings.  Items Reviewed: Did the pt receive and understand the discharge instructions provided? Yes  Medications obtained and verified? Yes  Other? No  Any new allergies since your discharge? No  Dietary orders reviewed? Yes Do you have support at home? Yes   Home Care and Equipment/Supplies: Were home health services ordered? yes If so, what is the name of the agency? [facility]  Has the agency set up a time to come to the patient's home? yes Were any new equipment or medical supplies ordered?  Yes: Enteral feeding What is the name of the medical supply agency? [facility] Were you able to get the supplies/equipment? yes Do you have any questions related to the use of the equipment or supplies? No  Functional Questionnaire: (I = Independent and D = Dependent) ADLs: Needs assistance  Bathing/Dressing- Needs assistance  Meal Prep- D  Eating- I  Maintaining continence- I  Transferring/Ambulation- Supervision needed  Managing Meds- I  Follow up appointments reviewed:  PCP Hospital f/u appt confirmed? Yes  Scheduled to see Dr. Zigmund Daniel on 09/17/22 @ 1:50. [place] [facility] f/u appt confirmed? No   Are transportation arrangements needed? No  If their condition worsens, is the pt aware to call PCP or go to the Emergency Dept.? Yes Was the patient provided with contact information for the PCP's office or ED? Yes Was to pt  encouraged to call back with questions or concerns? Yes  SDOH assessments and interventions completed:   Yes  Care Coordination Interventions Activated:  Yes   Care Coordination Interventions:  No Care Coordination interventions needed at this time.   Encounter Outcome:  Pt. Visit Completed

## 2022-09-11 NOTE — Telephone Encounter (Signed)
Received phone call from [place], [place] at [facility] regarding patient.   She reports that patient has been experiencing nausea today with two episodes of emesis. PEG tube placed on 11/8.  HH RN has already contacted nutritionist regarding tube feedings.   RN is requesting prescription for Zofran to be sent to [facility] on [address].   She is also requesting DME order for suction machine. If appropriate, please place order and route back to RN team.   Verbal orders provided for skilled nursing 2 times weekly for two weeks, then once weekly for seven weeks.   Forwarding to PCP for further advisement.   Talbot Grumbling, RN

## 2022-09-13 ENCOUNTER — Other Ambulatory Visit: Payer: Self-pay | Admitting: Student

## 2022-09-13 MED ORDER — ONDANSETRON HCL 8 MG PO TABS: 8.0000 mg | ORAL_TABLET | Freq: Three times a day (TID) | ORAL | 0 refills | Status: DC | PRN

## 2022-09-14 MED ORDER — Q-CARE Q2 ORAL CLEANS/SUCTION MT KIT: 1.0000 | PACK | OROMUCOSAL | 1 refills | Status: DC | PRN

## 2022-09-14 NOTE — Telephone Encounter (Signed)
Patient needs DME order placed for suction machine.  Routing to PCP for order placement.   Talbot Grumbling, RN

## 2022-09-14 NOTE — Addendum Note (Signed)
Addended by: Erskine Emery on: 09/14/2022 12:36 PM   Modules accepted: Orders

## 2022-09-14 NOTE — Telephone Encounter (Signed)
Community message sent to [employer] for suction machine. Will await response.   Talbot Grumbling, RN

## 2022-09-14 NOTE — Addendum Note (Signed)
Addended by: Lona Millard C on: 09/14/2022 12:05 PM   Modules accepted: Orders

## 2022-09-14 NOTE — Telephone Encounter (Signed)
I have pended the order for signing.   The previous order is for the oral care kit that could be used in combination with the suction machine.   Please route back to "RN team" once order has been signed.   Thanks.  

## 2022-09-17 ENCOUNTER — Encounter: Payer: Self-pay | Admitting: Student

## 2022-09-17 ENCOUNTER — Ambulatory Visit (INDEPENDENT_AMBULATORY_CARE_PROVIDER_SITE_OTHER): Payer: Medicare HMO | Admitting: Student

## 2022-09-17 VITALS — BP 122/58 | HR 83 | Wt 106.0 lb

## 2022-09-17 DIAGNOSIS — E119 Type 2 diabetes mellitus without complications: Secondary | ICD-10-CM

## 2022-09-17 DIAGNOSIS — I10 Essential (primary) hypertension: Secondary | ICD-10-CM

## 2022-09-17 DIAGNOSIS — R6889 Other general symptoms and signs: Secondary | ICD-10-CM | POA: Diagnosis not present

## 2022-09-17 DIAGNOSIS — R911 Solitary pulmonary nodule: Secondary | ICD-10-CM | POA: Diagnosis not present

## 2022-09-17 DIAGNOSIS — Z794 Long term (current) use of insulin: Secondary | ICD-10-CM | POA: Diagnosis not present

## 2022-09-17 DIAGNOSIS — R1311 Dysphagia, oral phase: Secondary | ICD-10-CM | POA: Diagnosis not present

## 2022-09-17 DIAGNOSIS — E43 Unspecified severe protein-calorie malnutrition: Secondary | ICD-10-CM

## 2022-09-17 DIAGNOSIS — R011 Cardiac murmur, unspecified: Secondary | ICD-10-CM

## 2022-09-17 DIAGNOSIS — K219 Gastro-esophageal reflux disease without esophagitis: Secondary | ICD-10-CM | POA: Diagnosis not present

## 2022-09-17 MED ORDER — ESOMEPRAZOLE MAGNESIUM 10 MG PO PACK: 10.0000 mg | PACK | Freq: Every day | ORAL | 12 refills | Status: DC

## 2022-09-17 NOTE — Assessment & Plan Note (Signed)
BP: (!) 122/58 today. Well controlled. Continue to work on healthy dietary habits and exercise. Follow up in 2 months.   Medication regimen: None; Will NOT restart Losartan at this time. Instructed patient to bring in BP measurements if able.

## 2022-09-17 NOTE — Assessment & Plan Note (Signed)
<  6 mm and stable.  Per radiologic recommendations, will continue with the CT scan of the chest for further evaluation.  Asymptomatic currently without shortness of breath on no oxygen requirement.

## 2022-09-17 NOTE — Assessment & Plan Note (Signed)
well controlled - Last A1c:  Lab Results  Component Value Date   HGBA1C 7.3 (A) 07/30/2022   - Medications: STOP metformin, low CBGs reported. Please monitor CBGs and send in a log to [name] off of Metformin and Invokana  - Compliance: Appropriate

## 2022-09-17 NOTE — Assessment & Plan Note (Signed)
Status post PEG tube placement.  Patient is tolerating feeds, instructed family to ask home health RN to give Sara [name] the information for the nutritionist so that I can speak to them.  She may need an alteration in her current feeds as she is continuing to have nausea and heartburn.  We will continue with Zofran for nausea and ordered Nexium packs for heartburn via tube

## 2022-09-17 NOTE — Patient Instructions (Addendum)
It was great to see you today! Thank you for choosing [facility] for your primary care. Sara [name] was seen for hospital follow-up.  We will continue with the CT scan of the chest to monitor the nodule that you have present in the lung that was found previously.  With the PEG tube and see speech outpatient, placed referral to the speech therapist   Please follow-up with neurology as recommended, appt on January 5th   Follow up with the nutritionist as well, ask home health nurse to message [name] with the information for the nutritionist   Discontinue your Metformin and monitor blood sugars   Please send me a my chart of the blood sugars, weight, blood pressure checks   I put in an order for Nexium to help with heartburn   If you haven't already, sign up for My Chart to have easy access to your labs results, and communication with your primary care physician.  I recommend that you always bring your medications to each appointment as this makes it easy to ensure you are on the correct medications and helps [name] not miss refills when you need them. Call the clinic at [phone] if your symptoms worsen or you have any concerns.  You should return to our clinic Return in about 4 weeks (around 10/15/2022) for Glucose and blood pressure control . Please arrive 15 minutes before your appointment to ensure smooth check in process.  We appreciate your efforts in making this happen.  Thank you for allowing me to participate in your care, Erskine Emery, MD 09/17/2022, 2:40 PM PGY-2, [place]

## 2022-09-17 NOTE — Assessment & Plan Note (Signed)
SLP referral placed. Continue with PEG tube and feeds. Nutritionist following.

## 2022-09-17 NOTE — Progress Notes (Signed)
SUBJECTIVE:   CHIEF COMPLAINT / HPI:   Hospital Follow Up:  Sara [name] is a 81 y.o. female who presented with progressive dysphagia and AKI who was found to have no mass but abnormal swelling. PMH significant for CKD 3a, T2DM, and HTN.   At the time of admission, the patient had been experiencing difficulty swallowing and 11 pound weight loss in the timeframe of one month. CT head showed tiny asymmetry on the right.  ENT consulted and recommended MRI which showed asymmetric enlargement and edema involving respite of the oral tongue without discrete mass. Family and patient agreed to PEG tube placement while she works with SLP outpatient to regain swallowing function.  PEG was placed 11/8.   Patient was found to have a 5.5 cm nodule LUL of lung. Complete chest CT with contrast recommended outpatient.   Previously held Canagliflozin and Losartan on discharge given soft pressures and BG.   Peg tube feeds: Having nausea and heartburn with feeds. Overall, tolerating them with Zofran.   Nutrition recommended the following regimen: Glucerna 1.2, 366m 4x/d (~6 cartons per day) 681mof free water before and after each bolus feed (12087mx/d) This will provide 1680kcal, 84g of protein, and 1607m56m free water   PERTINENT  PMH / PSH:   HTN, dysphagia, DM2, 3a CKD  OBJECTIVE:  BP (!) 122/58   Pulse 83   Wt 106 lb (48.1 kg)   SpO2 97%   BMI 20.03 kg/m  Physical Exam Vitals reviewed.  Constitutional:      Appearance: Normal appearance.  HENT:     Head: Normocephalic.     Nose: Nose normal.     Mouth/Throat:     Mouth: Mucous membranes are moist.     Pharynx: No oropharyngeal exudate or posterior oropharyngeal erythema.     Comments: No noticeable mass or tonsillar hypertrophy Eyes:     Conjunctiva/sclera: Conjunctivae normal.  Cardiovascular:     Rate and Rhythm: Normal rate and regular rhythm.     Comments: Midsystolic murmur  Pulmonary:     Effort: Pulmonary effort is  normal.     Breath sounds: Normal breath sounds.  Abdominal:     General: Abdomen is flat. Bowel sounds are normal. There is no distension.     Palpations: Abdomen is soft.     Comments: PEG in place with no erythema or swelling, no rashes noted, clean and dry   Musculoskeletal:     Cervical back: Normal range of motion.  Skin:    Capillary Refill: Capillary refill takes less than 2 seconds.  Neurological:     General: No focal deficit present.     Mental Status: She is alert.  Psychiatric:        Mood and Affect: Mood normal.        Behavior: Behavior normal.      ASSESSMENT/PLAN:  Gastroesophageal reflux disease, unspecified whether esophagitis present -     Esomeprazole Magnesium; Take 10 mg by mouth daily before breakfast.  Dispense: 30 each; Refill: 12  Lung nodule Assessment & Plan: <6 mm and stable.  Per radiologic recommendations, will continue with the CT scan of the chest for further evaluation.  Asymptomatic currently without shortness of breath on no oxygen requirement.  Orders: -     CT CHEST W CONTRAST; Future  Oral phase dysphagia Assessment & Plan: SLP referral placed. Continue with PEG tube and feeds. Nutritionist following.   Orders: -     Ambulatory referral to Speech  Therapy  Primary hypertension Assessment & Plan: BP: (!) 122/58 today. Well controlled. Continue to work on healthy dietary habits and exercise. Follow up in 2 months.   Medication regimen: None; Will NOT restart Losartan at this time. Instructed patient to bring in BP measurements if able.     Protein-calorie malnutrition, severe Assessment & Plan: Status post PEG tube placement.  Patient is tolerating feeds, instructed family to ask home health RN to give [name] the information for the nutritionist so that I can speak to them.  She may need an alteration in her current feeds as she is continuing to have nausea and heartburn.  We will continue with Zofran for nausea and ordered Nexium packs  for heartburn via tube    Excessive oral secretions  Type 2 diabetes mellitus without complication, with long-term current use of insulin (HCC) Assessment & Plan: well controlled - Last A1c:  Lab Results  Component Value Date   HGBA1C 7.3 (A) 07/30/2022   - Medications: STOP metformin, low CBGs reported. Please monitor CBGs and send in a log to [name] off of Metformin and Invokana  - Compliance: Appropriate     Murmur, cardiac Assessment & Plan: Remains Asymptomatic, heard today. Defer echo     Return in about 4 weeks (around 10/15/2022) for Glucose and blood pressure control . Erskine Emery, MD 09/17/2022, 3:17 PM PGY-2, [place]

## 2022-09-17 NOTE — Assessment & Plan Note (Signed)
Remains Asymptomatic, heard today. Defer echo

## 2022-09-18 ENCOUNTER — Telehealth: Payer: Self-pay

## 2022-09-18 NOTE — Telephone Encounter (Signed)
Glenard Haring, RN with [facility] calls nurse line regarding intolerance to PEG tube feedings.   Patient is experiencing fullness, bloating, diarrhea, nausea.    She is currently on Glucerna 1.2 cal. She is supposed to be getting feeds 4 times daily, however is only getting a little over 500 ml per day.   [facility] RN is concerned as patient has had a Weight loss of 5 lbs in the last week.   [place] RN provided phone number for nutritionist providing formula. Patient is receiving formula from [facility], phone number [phone].  Due to continued intolerance, nurse and family are asking if formula should be adjusted/changed.   Please advise.   Talbot Grumbling, RN

## 2022-09-18 NOTE — Telephone Encounter (Signed)
Received phone call from [facility] at [place] [facility] regarding PA for CT scan.   PA will be needed prior to scan on 11/27.  Will forward to [place].   Talbot Grumbling, RN

## 2022-09-19 NOTE — Telephone Encounter (Signed)
Authorization received and order updated.  Alizabeth Antonio,CMA

## 2022-09-21 ENCOUNTER — Other Ambulatory Visit: Payer: Self-pay | Admitting: Student

## 2022-09-21 DIAGNOSIS — R110 Nausea: Secondary | ICD-10-CM

## 2022-09-24 ENCOUNTER — Ambulatory Visit (HOSPITAL_COMMUNITY)
Admission: RE | Admit: 2022-09-24 | Discharge: 2022-09-24 | Disposition: A | Discharge status: Home / Self Care | Payer: Medicare HMO | Source: Ambulatory Visit | Attending: Family Medicine | Admitting: Family Medicine

## 2022-09-24 DIAGNOSIS — R911 Solitary pulmonary nodule: Secondary | ICD-10-CM | POA: Diagnosis not present

## 2022-09-24 DIAGNOSIS — R918 Other nonspecific abnormal finding of lung field: Secondary | ICD-10-CM | POA: Diagnosis not present

## 2022-09-24 MED ORDER — IOHEXOL 350 MG/ML SOLN
50.0000 mL | Freq: Once | INTRAVENOUS | Status: AC | PRN
  Administered 2022-09-24: 50 mL via INTRAVENOUS

## 2022-09-25 ENCOUNTER — Telehealth: Payer: Self-pay | Admitting: Student

## 2022-09-25 NOTE — Telephone Encounter (Signed)
Patient called with daughter on the phone. She stated at last visit that doctor stated she would put in the order for a suction to help with patients saliva stated it was through [facility] and the number was provider on AVS, daughter said they called and there is no order. Daughter is needing order placed ASAP.   Fax number to [place]: [phone]  Please call patient once order is placed.   Thanks!

## 2022-09-27 ENCOUNTER — Other Ambulatory Visit: Payer: Self-pay | Admitting: Student

## 2022-09-27 NOTE — Telephone Encounter (Signed)
Order was sent to [facility] on 11/17. I have sent a follow up message to check the status. Will await response.   Talbot Grumbling, RN

## 2022-09-27 NOTE — Telephone Encounter (Signed)
Routed message to PCP. Sara [name], CMA  

## 2022-09-28 ENCOUNTER — Telehealth: Payer: Self-pay | Admitting: Student

## 2022-09-28 NOTE — Telephone Encounter (Signed)
Patient requires oral suctioning for increased secretion. Suctioning kit ordered for this

## 2022-09-28 NOTE — Addendum Note (Signed)
Addended by: Erskine Emery on: 09/28/2022 08:03 AM   Modules accepted: Orders

## 2022-10-01 ENCOUNTER — Telehealth: Payer: Self-pay | Admitting: Student

## 2022-10-01 NOTE — Therapy (Unsigned)
OUTPATIENT SPEECH LANGUAGE PATHOLOGY SWALLOW EVALUATION   Patient Name: Sara [name] MRN: [id number] DOB:06/26/1941, 81 y.o., female Today's Date: 10/03/2022  PCP: Erskine Emery, MD / Leeanne Rio, MD REFERRING PROVIDER: Leeanne Rio, MD  END OF SESSION:  End of Session - 10/03/22 1521     Visit Number 1    Number of Visits 6    Date for SLP Re-Evaluation 11/21/22    Authorization Type [employer]    Progress Note Due on Visit 10    SLP Start Time 65    SLP Stop Time  6144    SLP Time Calculation (min) 45 min    Activity Tolerance Patient tolerated treatment well             Past Medical History:  Diagnosis Date   Allergy    Dizziness 02/21/2018   Estrogen deficiency 01/13/2018   Need for prophylactic vaccination against Streptococcus pneumoniae (pneumococcus) 01/13/2018   Right knee pain 05/29/2019   Routine general medical examination at a health care facility 01/13/2018   Seasonal allergies 06/21/2020   Vaginal itching 06/21/2020   Vision changes 01/13/2018   Past Surgical History:  Procedure Laterality Date   DG THUMB RIGHT HAND ([facility] HX) Right    EYE SURGERY     IR GASTROSTOMY TUBE MOD SED  09/05/2022   TUBAL LIGATION     Patient Active Problem List   Diagnosis Date Noted   Gastroesophageal reflux disease 09/17/2022   Lung nodule 09/17/2022   Excessive oral secretions 09/17/2022   Protein-calorie malnutrition, severe 09/06/2022   Dysphagia 09/03/2022   Oral phase dysphagia 09/02/2022   Tongue mass 08/30/2022   Acute rhinitis 08/24/2022   Bilateral hearing loss 07/30/2022   Murmur, cardiac 06/01/2022   Tinnitus 06/30/2021   Atherosclerotic vascular disease 09/16/2020   Need for immunization against influenza 09/16/2020   Stage 3a chronic kidney disease (CKD) ([place]) 09/14/2020   Neuropathic pain of both legs 06/23/2018   HTN (hypertension) 02/21/2018   Healthcare maintenance 01/13/2018   Type 2 diabetes mellitus without  complication, with long-term current use of insulin ([place]) 01/13/2018    ONSET DATE: referral [facility] 09/17/2022   REFERRING DIAG: R13.11 (ICD-10-CM) - Oral phase dysphagia   THERAPY DIAG:  Dysphagia, unspecified type  Rationale for Evaluation and Treatment: Rehabilitation  SUBJECTIVE:   SUBJECTIVE STATEMENT: "I just couldn't swallow one day" Pt accompanied by:  daughter, Sara [name]  PERTINENT HISTORY: Sara [name] is a 81 y.o. female who presented with progressive dysphagia and AKI who was found to have no mass but abnormal swelling. PMH significant for CKD 3a, T2DM, and HTN.    At the time of admission, the patient had been experiencing difficulty swallowing and 11 pound weight loss in the timeframe of one month. CT head showed tiny asymmetry on the right.  ENT consulted and recommended MRI which showed asymmetric enlargement and edema involving respite of the oral tongue without discrete mass. Family and patient agreed to PEG tube placement while she works with SLP outpatient to regain swallowing function.  PEG was placed 11/8.   PAIN:  Are you having pain? No  FALLS: Has patient fallen in last 6 months?  No  LIVING ENVIRONMENT: Lives with: lives with their family Lives in: House/apartment  PLOF: Independent   PATIENT GOALS: remove PEG, improve swallow  OBJECTIVE:   DIAGNOSTIC FINDINGS: 09/04/22 CT HEAD IMPRESSION: 1. No acute intracranial process. 2. Moderate stenosis at the origin of the right vertebral artery and in the  right V1 segment. No other significant stenosis in the neck. 3. No intracranial large vessel occlusion or significant stenosis. 4. Redemonstrated asymmetric enlargement of the right tongue. 5. Unchanged 5 mm subsolid nodule in the left upper lobe, as seen on the 08/30/2022 neck CT. As before, a dedicated chest CT with contrast is recommended for further evaluation.  RECOMMENDATIONS FROM OBJECTIVE SWALLOW STUDY (MBSS/FEES): Pt presents with a primary  pharyngeal dysphagia marked by significant difficulty transferring material through her pharynx. There was impaired laryngeal elevation, poor pharyngeal contraction/stripping, reduced and incomplete epiglottic inversion, reduced base-of-tongue contact to pharyngeal wall at the onset of the swallow. All of these deficits led to barium (thin, nectar, and puree) sitting in the hypopharynx, requiring multiple attempts by pt to repetitively but ineffectivly swallow to pass material through UES. Continuous breaks were required so that she could expectorate residuals. There was eventual spillage of barium into the larynx, reaching the level of the vocal folds. There was a secondary oral component to her dysphagia with difficulty propelling the bolus into the pharynx. However, she may have also developed some compensatory and maladaptive behaviors in an effort to better control the quantity that enters her pharynx. The quality of her swallow is consistent with a neurological etiology; recommend consideration of MRI and/or neuro consult. Recommend she continue full liquids as able, but she will likely need temporary enteral feeding until a plan is determined for disposition and follow-up.   COGNITION: Overall cognitive status: Within functional limits for tasks assessed  ORAL MOTOR EXAMINATION: Overall status: tongue deviation to R with abnormal "twisting" quality to R lateral tongue. Pt reports this is improved from previously, prior to this event, tells SLP "my tongue was normal"   CLINICAL SWALLOW ASSESSMENT:   Current diet: thin liquids + PEG Dentition:  dentures, pt endorsing slight decrease in comfort of fit since weight loss, plans to get new pair if needed Patient directly observed with POs: Yes: dysphagia 1 (puree), thin liquids, and mixed consistencies   Feeding: able to feed self Liquids provided by: cup Oral phase signs and symptoms: prolonged bolus formation Pharyngeal phase signs and symptoms:   none evidenced on this date Comments: pt denies odynophagia, denies globus sensation. Throughout demonstrates modulation of bolus size, fully clears oral cavity. Medications currently being administrated via PEG.   PATIENT REPORTED OUTCOME MEASURES (PROM): EAT-10: 30   TODAY'S TREATMENT:   10-03-22: SLP provided education on observations made during clinical swallow evaluation and recommendations. SLP recommends implementation of Dysphagia 1 diet at this time, with consideration for Dysphagia 2 with caution. Recommend increasing oral intake over course of next few weeks to aid in improved swallow function and confidence and ability to decrease tube feeds with end goal to meet nutrition needs orally. Discussion re: food options within dysphagia 1 diet. Education on swallow compensations and strategies. Collaborated with pt and daughter to generate plan of care and goals. All questions answered to satisfaction, pt able to teach back swallow strategies and diet recommendations.   PATIENT EDUCATION: Education details: see above Person educated: Patient and daughter Education method: Explanation, Demonstration, and Handouts Education comprehension: verbalized understanding, returned demonstration, and needs further education   ASSESSMENT:  CLINICAL IMPRESSION: Patient is a 81 y.o. F who was seen today for dysphagia evaluation. Pt with prior MBSS evidencing pharyngeal dysphagia and pt report of difficulty swallowing all textures and own secretions. Ongoing 1+ month. Has been taking thin liquids PO, using small sips. Has tried cream of chicken soup without difficulties. Able to  clear thin liquid (including 2 sequential sips), puree, pudding, and fruit cup boluses this date without overt difficulty. Denies globus sensation or odynophagia with all PO trials, no overt s/sx of aspiration. Appears to be suitable for upgrade to dysphagia 1 diet. At onset, voice changes noted (weaker and pitch changed) with  some (variable) improvement endorsed. Slight tremor noted with sustained phonation, prior to PO intake. Pt would benefit from skilled ST to assess ongoing diet tolerance, rehabilitate pt's swallow function, and advance diet (texture and quantity) to facilitate potential removal of PEG tube. Etiology of pt's dysphagia remains unclear, SLP recommends consideration for OP neurology consult and f/u MBSS to assess pt's current swallow function and safety based on improvement evidenced and endorsed.   OBJECTIVE IMPAIRMENTS: include dysphagia. These impairments are limiting patient from safety when swallowing. Factors affecting potential to achieve goals and functional outcome are medical prognosis unknown and unknown etiology. Patient will benefit from skilled SLP services to address above impairments and improve overall function.  REHAB POTENTIAL: Good   GOALS: Goals reviewed with patient? Yes  SHORT TERM GOALS: Target date: 13/2024  Pt will complete f/u MBSS to assess current status of swallow function and safety  Baseline: Goal status: INITIAL  2.  Pt will achieve 25% hydration/nutrition via PO  Baseline:  Goal status: INITIAL  3.  Pt will demonstrate compliance with swallow precautions, strategies, and compensations during all PO trials over 2 session with rare min-A Baseline:  Goal status: INITIAL   LONG TERM GOALS: Target date: 11/21/2022  Pt will follow swallow precautions & diet modifications with supervision cues over 1 week  Baseline:  Goal status: INITIAL  2.  Pt will achieve 50% hydration/nutrition via PO  Baseline:  Goal status: INITIAL  3.  Pt will tolerate dysphagia 2 textures without overt s/sx of aspiration or oral phase dysphagia over 1 week period  Baseline:  Goal status: INITIAL  4.  Pt will improve score on EAT - 10 by 4 points by d/c Baseline:  Goal status: INITIAL   PLAN:  SLP FREQUENCY: 1x/week  SLP DURATION: 8 weeks  PLANNED INTERVENTIONS:  Aspiration precaution training, Pharyngeal strengthening exercises, Diet toleration management , Environmental controls, Trials of upgraded texture/liquids, Cueing hierachy, Internal/external aids, SLP instruction and feedback, Compensatory strategies, Patient/family education, and Re-evaluation    Su Monks, [place] 10/03/2022, 3:21 PM

## 2022-10-01 NOTE — Telephone Encounter (Signed)
ADDENDUM to visit from 09/17/22: Patient with excessive oral secretions, inhibiting ability to perform daily activities. She requires a suctioning device for oral suctioning and suctioning machine to be ordered through [employer].   Erskine Emery MD

## 2022-10-03 ENCOUNTER — Ambulatory Visit: Payer: Medicare HMO | Attending: Audiologist | Admitting: Speech Pathology

## 2022-10-03 ENCOUNTER — Encounter: Payer: Self-pay | Admitting: Speech Pathology

## 2022-10-03 DIAGNOSIS — R1310 Dysphagia, unspecified: Secondary | ICD-10-CM | POA: Insufficient documentation

## 2022-10-04 ENCOUNTER — Other Ambulatory Visit (HOSPITAL_COMMUNITY): Payer: Self-pay

## 2022-10-04 DIAGNOSIS — R1310 Dysphagia, unspecified: Secondary | ICD-10-CM

## 2022-10-09 ENCOUNTER — Telehealth: Payer: Self-pay

## 2022-10-09 NOTE — Telephone Encounter (Signed)
Received phone call from RN at [facility] regarding patient's tube feeds. She is out of [place] and is needing new prescription sent to [facility].   New prescription will need to include quantity and specific directions. This will then need to be faxed to [phone].   Talbot Grumbling, RN

## 2022-10-10 ENCOUNTER — Ambulatory Visit (HOSPITAL_COMMUNITY)
Admission: RE | Admit: 2022-10-10 | Discharge: 2022-10-10 | Disposition: A | Discharge status: Home / Self Care | Payer: Medicare HMO | Source: Ambulatory Visit | Attending: Family Medicine | Admitting: Family Medicine

## 2022-10-10 ENCOUNTER — Ambulatory Visit (HOSPITAL_COMMUNITY)
Admission: RE | Admit: 2022-10-10 | Discharge: 2022-10-10 | Disposition: A | Discharge status: Home / Self Care | Payer: Medicare HMO | Source: Ambulatory Visit

## 2022-10-10 DIAGNOSIS — R1310 Dysphagia, unspecified: Secondary | ICD-10-CM | POA: Diagnosis not present

## 2022-10-10 DIAGNOSIS — R6339 Other feeding difficulties: Secondary | ICD-10-CM | POA: Diagnosis not present

## 2022-10-10 DIAGNOSIS — R1313 Dysphagia, pharyngeal phase: Secondary | ICD-10-CM | POA: Diagnosis not present

## 2022-10-10 NOTE — Progress Notes (Signed)
Modified Barium Swallow Progress Note  Patient Details  Name: TENIKA KEERAN MRN: [id number] Date of Birth: [birth year]  Today's Date: 10/10/2022  Modified Barium Swallow completed.  Full report located under Chart Review in the Imaging Section.  Brief recommendations include the following:  Clinical Impression  Pt's overall swallow function appears improved compared to that noted 09/01/22. Pt presents with pharyngeal dysphagia characterized by reduction in tongue base retraction, anterior laryngeal movement, and pharyngeal stripping. She demonstrated base of tongue residue, vallecular residue, and pyriform sinus residue. A liquid wash reduced pharyngeal residue and liquid residue was improved with secondary swallows. Penetration (PAS 2, 3, 5) was noted with consecutive swallows of thin liquids. Penetration was often with consecutive swallows and secondary to pyriform sinus residue; this risk was reduced with use of individual swallows and cued secondary swallows ofter the initial liquid bolus. A dysphagia 3 diet with thin liquids is recommended at this time with observance of swallowing precautions and use of liquids (e.g., gravy) to further moisten meats. Continued outpatient SLP intervention for dysphagia is recommended.   Swallow Evaluation Recommendations       SLP Diet Recommendations: Dysphagia 3 (Mech soft) solids;Thin liquid   Liquid Administration via: Straw;Cup   Medication Administration: Crushed with puree (or via G-tube)       Compensations: Slow rate;Small sips/bites;Multiple dry swallows after each bite/sip;Follow solids with liquid       Oral Care Recommendations: Oral care BID      Charlee Squibb I. Hardin Negus, [place], [place] Office number [phone]    Horton Marshall 10/10/2022,4:01 PM

## 2022-10-15 ENCOUNTER — Other Ambulatory Visit: Payer: Self-pay | Admitting: Student

## 2022-10-15 DIAGNOSIS — R634 Abnormal weight loss: Secondary | ICD-10-CM | POA: Diagnosis not present

## 2022-10-15 DIAGNOSIS — E785 Hyperlipidemia, unspecified: Secondary | ICD-10-CM | POA: Diagnosis not present

## 2022-10-15 DIAGNOSIS — E1122 Type 2 diabetes mellitus with diabetic chronic kidney disease: Secondary | ICD-10-CM | POA: Diagnosis not present

## 2022-10-15 DIAGNOSIS — R1310 Dysphagia, unspecified: Secondary | ICD-10-CM | POA: Diagnosis not present

## 2022-10-15 DIAGNOSIS — Z431 Encounter for attention to gastrostomy: Secondary | ICD-10-CM | POA: Diagnosis not present

## 2022-10-15 DIAGNOSIS — I129 Hypertensive chronic kidney disease with stage 1 through stage 4 chronic kidney disease, or unspecified chronic kidney disease: Secondary | ICD-10-CM | POA: Diagnosis not present

## 2022-10-15 DIAGNOSIS — K148 Other diseases of tongue: Secondary | ICD-10-CM | POA: Diagnosis not present

## 2022-10-15 DIAGNOSIS — N179 Acute kidney failure, unspecified: Secondary | ICD-10-CM | POA: Diagnosis not present

## 2022-10-15 DIAGNOSIS — N1831 Chronic kidney disease, stage 3a: Secondary | ICD-10-CM | POA: Diagnosis not present

## 2022-10-15 MED ORDER — KATE FARMS STANDARD 1.4 PO LIQD: ORAL | 2 refills | Status: DC

## 2022-10-16 ENCOUNTER — Encounter: Payer: Self-pay | Admitting: Student

## 2022-10-17 DIAGNOSIS — R1311 Dysphagia, oral phase: Secondary | ICD-10-CM | POA: Diagnosis not present

## 2022-10-18 NOTE — Telephone Encounter (Signed)
Faxed to [facility].   Talbot Grumbling, RN

## 2022-10-19 ENCOUNTER — Other Ambulatory Visit: Payer: Self-pay | Admitting: Student

## 2022-10-19 ENCOUNTER — Ambulatory Visit: Payer: Medicare HMO | Admitting: Speech Pathology

## 2022-10-19 ENCOUNTER — Telehealth: Payer: Self-pay

## 2022-10-19 DIAGNOSIS — R1310 Dysphagia, unspecified: Secondary | ICD-10-CM | POA: Diagnosis not present

## 2022-10-19 DIAGNOSIS — I10 Essential (primary) hypertension: Secondary | ICD-10-CM

## 2022-10-19 DIAGNOSIS — Z794 Long term (current) use of insulin: Secondary | ICD-10-CM

## 2022-10-19 DIAGNOSIS — N182 Chronic kidney disease, stage 2 (mild): Secondary | ICD-10-CM

## 2022-10-19 NOTE — Patient Instructions (Addendum)
Next appointment is November 02, 2022 at 11:00 If you need to reschedule call the front office at [phone]  Keep a log of what you are eating and how much  Breakfast: banana with pack of oatmeal   Lunch: 3 chicken wings  Also keep note if anything was overtly challenging or surprising   Swallow Compensations: Chew thoroughly  Follow solids with a drink Occasional "dry swallow" (drink/eat, swallow, then swallow again to clear any residue from throat) If needed, moisten foods   HARD SWALLOW Swallowing as hard as possible-- tongue pushes up to roof of mouth and squeeze all your throat muscles   Your goal is to do 100 hard swallows every day   Sip water, 5 effortful swallows x6 sets (this gets you to 30!) 3x a day = 90 repetitions    Sip water, 5 effortful swallows x10 sets (this gets you to 50!) 2x day = 100 repetitions   Sip water, 10 effortful swallows x5 sets (this gets you to 50!) 2x day - 100 repetitions    Sip water, 7 effortful swallows x5 sets (this gets you to 35!) 3x a day = 105 repetitions   Sip water 7 effortful swallows x8 sets (this gets you to 56!) 2x a day = 112 repetitions

## 2022-10-19 NOTE — Therapy (Signed)
OUTPATIENT SPEECH LANGUAGE PATHOLOGY SWALLOW TREATMENT    Patient Name: Sara [name] MRN: [id number] DOB:January 06, 1941, 81 y.o., female Today's Date: 10/19/2022  PCP: Erskine Emery, MD / Leeanne Rio, MD REFERRING PROVIDER: Leeanne Rio, MD  END OF SESSION:  End of Session - 10/19/22 1049     Visit Number 2    Number of Visits 6    Date for SLP Re-Evaluation 11/21/22    Authorization Type [employer]    Progress Note Due on Visit 10    SLP Start Time 1049    SLP Stop Time  1122    SLP Time Calculation (min) 33 min    Activity Tolerance Patient tolerated treatment well              Past Medical History:  Diagnosis Date   Allergy    Dizziness 02/21/2018   Estrogen deficiency 01/13/2018   Need for prophylactic vaccination against Streptococcus pneumoniae (pneumococcus) 01/13/2018   Right knee pain 05/29/2019   Routine general medical examination at a health care facility 01/13/2018   Seasonal allergies 06/21/2020   Vaginal itching 06/21/2020   Vision changes 01/13/2018   Past Surgical History:  Procedure Laterality Date   DG THUMB RIGHT HAND ([facility] HX) Right    EYE SURGERY     IR GASTROSTOMY TUBE MOD SED  09/05/2022   TUBAL LIGATION     Patient Active Problem List   Diagnosis Date Noted   Gastroesophageal reflux disease 09/17/2022   Lung nodule 09/17/2022   Excessive oral secretions 09/17/2022   Protein-calorie malnutrition, severe 09/06/2022   Dysphagia 09/03/2022   Oral phase dysphagia 09/02/2022   Tongue mass 08/30/2022   Acute rhinitis 08/24/2022   Bilateral hearing loss 07/30/2022   Murmur, cardiac 06/01/2022   Tinnitus 06/30/2021   Atherosclerotic vascular disease 09/16/2020   Need for immunization against influenza 09/16/2020   Stage 3a chronic kidney disease (CKD) ([place]) 09/14/2020   Neuropathic pain of both legs 06/23/2018   HTN (hypertension) 02/21/2018   Healthcare maintenance 01/13/2018   Type 2 diabetes mellitus without  complication, with long-term current use of insulin ([place]) 01/13/2018    ONSET DATE: referral [facility] 09/17/2022   REFERRING DIAG: R13.11 (ICD-10-CM) - Oral phase dysphagia   THERAPY DIAG:  Dysphagia, unspecified type  Rationale for Evaluation and Treatment: Rehabilitation  SUBJECTIVE:   SUBJECTIVE STATEMENT: "I think I did good on it"   PAIN:  Are you having pain? No  OBJECTIVE:   RECOMMENDATIONS FROM OBJECTIVE SWALLOW STUDY (MBSS/FEES) 10-10-2022: "Pt presents with pharyngeal dysphagia characterized by reduction in tongue base retraction, anterior laryngeal movement, and pharyngeal stripping. She demonstrated base of tongue residue, vallecular residue, and pyriform sinus residue." Recommendations: liquid wash, single sips, dry swallow, moistened meats  PATIENT REPORTED OUTCOME MEASURES (PROM): EAT-10: 30   TODAY'S TREATMENT:     DYSPHAGIA TREATMENT 10-19-2022:   Current diet: Dysphagia 3 (mechanical soft) and thin liquids Swallow precautions: chew thoroughly, alternate bites and sips, and limit distractions while eating Patient directly observed with POs: Yes: thin liquids  Feeding: able to feed self Liquids provided by: cup Oral phase signs and symptoms:  none Pharyngeal phase signs and symptoms:  none Therapeutic exercises: Effortful Swallow (4 sets of 5 repetitions, totaling 15 this date) Types of cueing: verbal and visual Amount of cueing: usual  moderate, faded to rare min Treatment comments: SLP educated on results from recent MBSS, all questions answered to satisfaction.     10-03-22: SLP provided education on observations made during  clinical swallow evaluation and recommendations. SLP recommends implementation of Dysphagia 1 diet at this time, with consideration for Dysphagia 2 with caution. Recommend increasing oral intake over course of next few weeks to aid in improved swallow function and confidence and ability to decrease tube feeds with end goal to meet  nutrition needs orally. Discussion re: food options within dysphagia 1 diet. Education on swallow compensations and strategies. Collaborated with pt and daughter to generate plan of care and goals. All questions answered to satisfaction, pt able to teach back swallow strategies and diet recommendations.   PATIENT EDUCATION: Education details: see above Person educated: Patient and daughter Education method: Explanation, Demonstration, and Handouts Education comprehension: verbalized understanding, returned demonstration, and needs further education   ASSESSMENT:  CLINICAL IMPRESSION: Patient is a 81 y.o. F who was seen today for dysphagia evaluation. Pt with prior MBSS evidencing pharyngeal dysphagia and pt report of difficulty swallowing all textures and own secretions. Ongoing 1+ month. Has been taking thin liquids PO, using small sips. Has tried cream of chicken soup without difficulties. Able to clear thin liquid (including 2 sequential sips), puree, pudding, and fruit cup boluses this date without overt difficulty. Denies globus sensation or odynophagia with all PO trials, no overt s/sx of aspiration. Appears to be suitable for upgrade to dysphagia 1 diet. At onset, voice changes noted (weaker and pitch changed) with some (variable) improvement endorsed. Slight tremor noted with sustained phonation, prior to PO intake. Pt would benefit from skilled ST to assess ongoing diet tolerance, rehabilitate pt's swallow function, and advance diet (texture and quantity) to facilitate potential removal of PEG tube. Etiology of pt's dysphagia remains unclear, SLP recommends consideration for OP neurology consult and f/u MBSS to assess pt's current swallow function and safety based on improvement evidenced and endorsed.   OBJECTIVE IMPAIRMENTS: include dysphagia. These impairments are limiting patient from safety when swallowing. Factors affecting potential to achieve goals and functional outcome are medical  prognosis unknown and unknown etiology. Patient will benefit from skilled SLP services to address above impairments and improve overall function.  REHAB POTENTIAL: Good   GOALS: Goals reviewed with patient? Yes  SHORT TERM GOALS: Target date: 13/2024  Pt will complete f/u MBSS to assess current status of swallow function and safety  Baseline: 10-10-22 Goal status: MET  2.  Pt will achieve 25% hydration/nutrition via PO  Baseline:  Goal status: INITIAL  3.  Pt will demonstrate compliance with swallow precautions, strategies, and compensations during all PO trials over 2 session with rare min-A Baseline:  Goal status: INITIAL   LONG TERM GOALS: Target date: 11/21/2022  Pt will follow swallow precautions & diet modifications with supervision cues over 1 week  Baseline:  Goal status: INITIAL  2.  Pt will achieve 50% hydration/nutrition via PO  Baseline:  Goal status: INITIAL  3.  Pt will tolerate dysphagia 2 textures without overt s/sx of aspiration or oral phase dysphagia over 1 week period  Baseline:  Goal status: INITIAL  4.  Pt will improve score on EAT - 10 by 4 points by d/c Baseline:  Goal status: INITIAL   PLAN:  SLP FREQUENCY: 1x/week  SLP DURATION: 8 weeks  PLANNED INTERVENTIONS: Aspiration precaution training, Pharyngeal strengthening exercises, Diet toleration management , Environmental controls, Trials of upgraded texture/liquids, Cueing hierachy, Internal/external aids, SLP instruction and feedback, Compensatory strategies, Patient/family education, and Re-evaluation    Su Monks, [place] 10/19/2022, 10:49 AM

## 2022-10-19 NOTE — Telephone Encounter (Signed)
Patient calls nurse line in regards to blood pressure medication.  Patient advise at last office visit Losartan was not being refilled.   She reports her blood pressure this morning was 168/85. She reports this is the highest it has been and why she called. She denies any chest pains, headaches, vision changes or SOB at this time.  She reports some bilateral leg swelling. Denies any warm or fevers. She reports she elevated her feet and the swelling went down.  Patient advised to monitor leg swelling and keep a BP journal.   ED precautions discussed.   Patient scheduled for 11/01/2022 with PCP.

## 2022-10-20 ENCOUNTER — Other Ambulatory Visit: Payer: Self-pay | Admitting: Student

## 2022-10-20 DIAGNOSIS — E119 Type 2 diabetes mellitus without complications: Secondary | ICD-10-CM

## 2022-10-22 ENCOUNTER — Other Ambulatory Visit: Payer: Self-pay

## 2022-10-22 ENCOUNTER — Emergency Department (HOSPITAL_COMMUNITY)
Admission: EM | Admit: 2022-10-22 | Discharge: 2022-10-23 | Disposition: A | Discharge status: Home / Self Care | Payer: Medicare HMO | Attending: Emergency Medicine | Admitting: Emergency Medicine

## 2022-10-22 DIAGNOSIS — R339 Retention of urine, unspecified: Secondary | ICD-10-CM | POA: Diagnosis not present

## 2022-10-22 DIAGNOSIS — E119 Type 2 diabetes mellitus without complications: Secondary | ICD-10-CM | POA: Insufficient documentation

## 2022-10-22 DIAGNOSIS — N390 Urinary tract infection, site not specified: Secondary | ICD-10-CM | POA: Insufficient documentation

## 2022-10-22 DIAGNOSIS — Z7984 Long term (current) use of oral hypoglycemic drugs: Secondary | ICD-10-CM | POA: Insufficient documentation

## 2022-10-22 DIAGNOSIS — R338 Other retention of urine: Secondary | ICD-10-CM

## 2022-10-22 DIAGNOSIS — R600 Localized edema: Secondary | ICD-10-CM | POA: Diagnosis not present

## 2022-10-22 DIAGNOSIS — Z794 Long term (current) use of insulin: Secondary | ICD-10-CM | POA: Diagnosis not present

## 2022-10-22 DIAGNOSIS — Z79899 Other long term (current) drug therapy: Secondary | ICD-10-CM | POA: Diagnosis not present

## 2022-10-22 DIAGNOSIS — I10 Essential (primary) hypertension: Secondary | ICD-10-CM | POA: Diagnosis not present

## 2022-10-22 DIAGNOSIS — R109 Unspecified abdominal pain: Secondary | ICD-10-CM | POA: Diagnosis present

## 2022-10-22 LAB — COMPREHENSIVE METABOLIC PANEL
ALT: 24 U/L (ref 0–44)
AST: 35 U/L (ref 15–41)
Albumin: 4 g/dL (ref 3.5–5.0)
Alkaline Phosphatase: 67 U/L (ref 38–126)
Anion gap: 12 (ref 5–15)
BUN: 15 mg/dL (ref 8–23)
CO2: 24 mmol/L (ref 22–32)
Calcium: 10 mg/dL (ref 8.9–10.3)
Chloride: 104 mmol/L (ref 98–111)
Creatinine, Ser: 0.89 mg/dL (ref 0.44–1.00)
GFR, Estimated: >60 mL/min (ref >60)
Glucose, Bld: 178 mg/dL — ABNORMAL HIGH (ref 70–99)
Potassium: 3.9 mmol/L (ref 3.5–5.1)
Sodium: 140 mmol/L (ref 135–145)
Total Bilirubin: 1 mg/dL (ref 0.3–1.2)
Total Protein: 8.1 g/dL (ref 6.5–8.1)

## 2022-10-22 LAB — CBC
HCT: 37.9 % (ref 36.0–46.0)
Hemoglobin: 11.8 g/dL — ABNORMAL LOW (ref 12.0–15.0)
MCH: 27.4 pg (ref 26.0–34.0)
MCHC: 31.1 g/dL (ref 30.0–36.0)
MCV: 88.1 fL (ref 80.0–100.0)
Platelets: 257 10*3/uL (ref 150–400)
RBC: 4.3 MIL/uL (ref 3.87–5.11)
RDW: 15.5 % (ref 11.5–15.5)
WBC: 7.7 10*3/uL (ref 4.0–10.5)
nRBC: 0 % (ref 0.0–0.2)

## 2022-10-22 NOTE — Telephone Encounter (Signed)
I have reviewed last clinic note, it is specifically mentioned that losartan would not be restarted and patient was to return with blood pressure log.  Continue with plan of PCP follow-up on 11/01/2022.

## 2022-10-22 NOTE — ED Provider Triage Note (Signed)
Emergency Medicine Provider Triage Evaluation Note  Sara [name] , a 81 y.o. female  was evaluated in triage.  Pt complains of suprapubic pain and urinary retention.  Reports last urinated around 5 PM but has not been able to urinate since then.  She feels an urgency that she needs to pee.  Is very uncomfortable.  She said this is similar to when she had a UTI several years ago.  Denies dysuria earlier this week..  Review of Systems  Positive: Difficulty urinating, abdominal pain Negative: Blood in urine  Physical Exam  BP (!) 189/115 (BP Location: Left Arm)   Pulse (!) 130   Temp 97.9 F (36.6 C) (Oral)   Resp 19   Ht '5\' 1"'$  (1.549 m)   Wt 53.5 kg   SpO2 99%   BMI 22.28 kg/m  Gen:   Awake, pacing and uncomfortable in the room Resp:  Normal effort  MSK:   Moves extremities without difficulty  Other:  Suprapubic abdominal pressure and tenderness  Medical Decision Making  Medically screening exam initiated at 10:16 PM.  Appropriate orders placed.  GIANNY KILLMAN was informed that the remainder of the evaluation will be completed by another provider, this initial triage assessment does not replace that evaluation, and the importance of remaining in the ED until their evaluation is complete.  Concern for possible bladder outlet obstruction versus UTI.  I have asked nurse to place Foley catheter, send UA and urine culture.  Basic labs also ordered.   Wyvonnia Dusky, MD 10/22/22 2216

## 2022-10-22 NOTE — ED Provider Notes (Signed)
I was asked to assist with placement of the catheter. On exam patient has obvious uterine prolapse. Her Cervix sits at the introitus of her vagina. I had to manually displace the patient's Cervix which was physically obstructing the patient's urethra. With displacement the patient had blood urine output.  The nurse was able to place the catheter .  Patient will likely need referral to Uro-gynecology for pessary fitting.   Margarita Mail, PA-C 10/22/22 2347    Wyvonnia Dusky, MD 10/25/22 1341

## 2022-10-22 NOTE — ED Triage Notes (Signed)
Pt via POV reporting urinary retention since earlier today. Last voided around 5:30pm. Pt is reporting bladder pain rated 9/10 and denies additional symptoms. Previous UTI presented with similar symptoms.

## 2022-10-23 LAB — URINALYSIS, ROUTINE W REFLEX MICROSCOPIC
RBC / HPF: >50 RBC/hpf — ABNORMAL HIGH (ref 0–5)
WBC, UA: >50 WBC/hpf — ABNORMAL HIGH (ref 0–5)

## 2022-10-23 MED ORDER — CEPHALEXIN 250 MG/5ML PO SUSR
500.0000 mg | Freq: Once | ORAL | Status: AC
  Administered 2022-10-23: 500 mg
  Filled 2022-10-23: qty 10

## 2022-10-23 MED ORDER — CEPHALEXIN 250 MG/5ML PO SUSR: 500.0000 mg | Freq: Two times a day (BID) | ORAL | 0 refills | Status: AC

## 2022-10-23 NOTE — ED Notes (Signed)
EDP at bedside  

## 2022-10-23 NOTE — ED Provider Notes (Signed)
[facility] DEPT Provider Note   CSN: [id number] Arrival date & time: 10/22/22  2111     History  Chief Complaint  Patient presents with   Urinary Retention    Sara [name] is a 81 y.o. female.  The history is provided by the patient and a relative.  Sara [name] is a 81 y.o. female who presents to the Emergency Department complaining of urinary retention.  She presents to the emergency department accompanied by her daughter for evaluation of urinary retention.  Symptoms started earlier today.  Initially she was having difficulty urinating and was able to get a few drops out later she was unable to void at all.  She had associated abdominal pain.  No fevers, nausea, vomiting.  No prior similar symptoms.  She does have a history of diabetes and has a gastrostomy tube due to difficulty swallowing secondary to her diabetes.  Also has a history of hypertension.  She has been dealing with lower extremity edema.  No associated chest pain or difficulty breathing.     Home Medications Prior to Admission medications   Medication Sig Start Date End Date Taking? Authorizing Provider  cephALEXin (KEFLEX) 250 MG/5ML suspension Place 10 mLs (500 mg total) into feeding tube 2 (two) times daily for 10 days. 10/23/22 11/02/22 Yes Quintella Reichert, MD  Accu-Chek Softclix Lancets lancets Please use to check blood sugar up to 4 times daily. 08/21/22   Erskine Emery, MD  atorvastatin (LIPITOR) 40 MG tablet Place 1 tablet (40 mg total) into feeding tube daily. 09/11/22 12/10/22  Ganta, Anupa, DO  blood glucose meter kit and supplies KIT Dispense based on patient and insurance preference. Use up to four times daily as directed. 08/24/22   Holley Bouche, MD  cetirizine (ZYRTEC) 10 MG tablet Take 1 tablet (10 mg total) by mouth daily as needed for allergies. 08/24/22   Holley Bouche, MD  diclofenac Sodium (VOLTAREN) 1 % GEL Apply 2 g topically 4 (four) times  daily. Patient taking differently: Apply 2 g topically daily as needed (For pain). 04/06/21   Daisy Floro, DO  esomeprazole (NEXIUM) 10 MG packet Take 10 mg by mouth daily before breakfast. 09/17/22   Erskine Emery, MD  Gabapentin 25 MG TABS Give 100 mg by tube at bedtime. 09/10/22   Ganta, Anupa, DO  glucose blood test strip 1 each by Other route 4 (four) times daily - after meals and at bedtime. 08/23/22   Erskine Emery, MD  Insulin Pen Needle (PEN NEEDLES 3/16") 31G X 5 MM MISC Use as instructed to inject insulin once daily 10/05/21   Lyndee Hensen, DO  metFORMIN (GLUCOPHAGE) 500 MG tablet Place 2 tablets (1,000 mg total) into feeding tube 2 (two) times daily with a meal. 09/10/22 12/09/22  Ganta, Anupa, DO  Nutritional Supplements (FEEDING SUPPLEMENT, GLUCERNA 1.2 CAL,) LIQD Place 1,000 mLs into feeding tube continuous. 09/10/22 12/09/22  Donney Dice, DO  Nutritional Supplements (FEEDING SUPPLEMENT, KATE FARMS STANDARD 1.4,) LIQD liquid Per nutritionist. Previously on Glucerna 362m 4x/d (~6 cartons per day), 688mof free water before and after each bolus feed (12018mx/d) 10/15/22   MaxErskine EmeryD  ondansetron (ZOFRAN) 8 MG tablet TAKE 1 TABLET BY MOUTH EVERY 8 HOURS AS NEEDED FOR UP TO 5 DAYS FOR NAUSEA FOR VOMITING 09/24/22   MaxErskine EmeryD  Oral Hygiene Products (Q-CARE Q2 ORAL CLEANS/SUCTION) KIT Use as directed 1 kit in the mouth or throat as needed. 09/14/22   MaxErskine Emery  MD  phosphorus (K PHOS NEUTRAL) 155-852-130 MG tablet Place 1 tablet (250 mg total) into feeding tube 2 (two) times daily. 09/10/22 12/09/22  Donney Dice, DO  thiamine (VITAMIN B-1) 100 MG tablet Place 1 tablet (100 mg total) into feeding tube daily. 09/11/22 12/10/22  Donney Dice, DO      Allergies    Patient has no known allergies.    Review of Systems   Review of Systems  All other systems reviewed and are negative.   Physical Exam Updated Vital Signs BP (!) 189/115 (BP Location: Left Arm)    Pulse (!) 130   Temp 97.9 F (36.6 C) (Oral)   Resp 19   Ht _0  (1.549 m)   Wt 53.5 kg   SpO2 99%   BMI 22.28 kg/m  Physical Exam Vitals and nursing note reviewed.  Constitutional:      Appearance: She is well-developed.  HENT:     Head: Normocephalic and atraumatic.  Cardiovascular:     Rate and Rhythm: Normal rate and regular rhythm.  Pulmonary:     Effort: Pulmonary effort is normal.     Breath sounds: Normal breath sounds.  Abdominal:     Palpations: Abdomen is soft.     Tenderness: There is no abdominal tenderness. There is no guarding or rebound.     Comments: Gastrostomy tube in place without any surrounding erythema or edema.  Musculoskeletal:        General: No tenderness.     Comments: Pitting edema to bilateral lower extremities  Skin:    General: Skin is warm and dry.  Neurological:     Mental Status: She is alert and oriented to person, place, and time.  Psychiatric:        Behavior: Behavior normal.     ED Results / Procedures / Treatments   Labs (all labs ordered are listed, but only abnormal results are displayed) Labs Reviewed  URINALYSIS, ROUTINE W REFLEX MICROSCOPIC - Abnormal; Notable for the following components:      Result Value   Color, Urine RED (*)    APPearance CLOUDY (*)    Glucose, UA   (*)    Value: TEST NOT REPORTED DUE TO COLOR INTERFERENCE OF URINE PIGMENT   Hgb urine dipstick   (*)    Value: TEST NOT REPORTED DUE TO COLOR INTERFERENCE OF URINE PIGMENT   Bilirubin Urine   (*)    Value: TEST NOT REPORTED DUE TO COLOR INTERFERENCE OF URINE PIGMENT   Ketones, ur   (*)    Value: TEST NOT REPORTED DUE TO COLOR INTERFERENCE OF URINE PIGMENT   Protein, ur   (*)    Value: TEST NOT REPORTED DUE TO COLOR INTERFERENCE OF URINE PIGMENT   Nitrite   (*)    Value: TEST NOT REPORTED DUE TO COLOR INTERFERENCE OF URINE PIGMENT   Leukocytes,Ua   (*)    Value: TEST NOT REPORTED DUE TO COLOR INTERFERENCE OF URINE PIGMENT   RBC / HPF >50 (*)     WBC, UA >50 (*)    Bacteria, UA RARE (*)    All other components within normal limits  CBC - Abnormal; Notable for the following components:   Hemoglobin 11.8 (*)    All other components within normal limits  COMPREHENSIVE METABOLIC PANEL - Abnormal; Notable for the following components:   Glucose, Bld 178 (*)    All other components within normal limits  URINE CULTURE    EKG None  Radiology  No results found.  Procedures Procedures    Medications Ordered in ED Medications  cephALEXin (KEFLEX) 250 MG/5ML suspension 500 mg (has no administration in time range)    ED Course/ Medical Decision Making/ A&P                           Medical Decision Making Amount and/or Complexity of Data Reviewed Labs: ordered.  Risk Prescription drug management.  Patient here for evaluation of inability to urinate.  She had urinary retention at time of ED presentation and a catheter was placed.  Per PA note she had a bladder outlet obstruction secondary to pelvic organ prolapse.  Urine that was returned was frankly bloody, did not clear but her bladder is draining well.  After placement of the catheter her abdominal pain has completely resolved.  Concern for UTI given her symptoms and urinalysis.  Will send a culture and start on antibiotics.  Discussed with patient and daughter home care for UTI as well as bladder outlet obstruction.  Recommend that she keep the Foley catheter in place and follow-up with urology.  Labs with normal renal function, essentially normal DBC.  Feel she is stable at this point for outpatient follow-up with return precautions.   No evidence of sepsis.        Final Clinical Impression(s) / ED Diagnoses Final diagnoses:  Lower urinary tract infectious disease  Acute urinary retention    Rx / DC Orders ED Discharge Orders          Ordered    cephALEXin (KEFLEX) 250 MG/5ML suspension  2 times daily        10/23/22 0154              Quintella Reichert, MD 10/23/22 0205

## 2022-10-24 LAB — URINE CULTURE: Culture: NO GROWTH

## 2022-10-24 NOTE — Telephone Encounter (Signed)
Patient calls nurse line again with continuing concerns with blood pressure.   Patient scheduled for tomorrow for evaluation.   Please cancel FU apt with PCP if no longer needed.

## 2022-10-25 ENCOUNTER — Ambulatory Visit (INDEPENDENT_AMBULATORY_CARE_PROVIDER_SITE_OTHER): Payer: Medicare HMO | Admitting: Family Medicine

## 2022-10-25 ENCOUNTER — Encounter: Payer: Self-pay | Admitting: Family Medicine

## 2022-10-25 VITALS — BP 149/75 | HR 94 | Ht 61.0 in | Wt 113.4 lb

## 2022-10-25 DIAGNOSIS — E119 Type 2 diabetes mellitus without complications: Secondary | ICD-10-CM | POA: Diagnosis not present

## 2022-10-25 DIAGNOSIS — I10 Essential (primary) hypertension: Secondary | ICD-10-CM

## 2022-10-25 DIAGNOSIS — Z794 Long term (current) use of insulin: Secondary | ICD-10-CM | POA: Diagnosis not present

## 2022-10-25 MED ORDER — LOSARTAN POTASSIUM 50 MG PO TABS: 50.0000 mg | ORAL_TABLET | Freq: Every day | ORAL | 0 refills | Status: DC

## 2022-10-25 MED ORDER — ACCU-CHEK GUIDE VI STRP: ORAL_STRIP | 0 refills | Status: AC

## 2022-10-25 NOTE — Assessment & Plan Note (Signed)
Previously well-controlled off of medication but now uncontrolled. - restart losartan 50 mg - advised to monitor BP at home - check BMP in 2 weeks

## 2022-10-25 NOTE — Progress Notes (Signed)
    SUBJECTIVE:   CHIEF COMPLAINT / HPI:  Chief Complaint  Patient presents with   Follow-up    BP    Patient was previously on losartan 50 mg but this was held after recent hospitalization in November of this year.  At hospital follow-up appointment with PCP she was maintained off of the losartan and advised to monitor her BP at home.  In the interim, she has called the nurse line due to elevated blood pressure readings.  Patient was also recently seen in the ED on 12/25 for acute urinary retention and had a Foley catheter placed with plan for urology follow-up.  Also treated for UTI.  Urine culture obtained revealed no growth.  Patient reports that she has a follow-up appointment with urology on 1/10.  Patient reports her BP readings have been in the 150s-160s/80s-90s. She did keep a BP log but forgot to bring it today.  She denies chest pain, shortness of breath, headache, vision changes.  She has noticed a little bit of swelling in her ankles which has improved.  She has been trying to elevate her legs.  PERTINENT  PMH / PSH: HTN, dysphagia s/p PEG tube, T2DM, CKD stage IIIa  Patient Care Team: Erskine Emery, MD as PCP - General (Family Medicine)   OBJECTIVE:   BP (!) 151/75   Pulse 94   Ht '5\' 1"'$  (1.549 m)   Wt 113 lb 6 oz (51.4 kg)   SpO2 100%   BMI 21.42 kg/m   Physical Exam Constitutional:      General: She is not in acute distress.    Appearance: Normal appearance.  Cardiovascular:     Rate and Rhythm: Normal rate and regular rhythm.  Pulmonary:     Effort: Pulmonary effort is normal. No respiratory distress.     Breath sounds: Normal breath sounds.  Genitourinary:    Comments: Foley catheter in place, yellow urine noted in bag Musculoskeletal:     Cervical back: Neck supple.     Comments: Trace edema bilateral ankles  Neurological:     Mental Status: She is alert.         10/25/2022    9:37 AM  Depression screen PHQ 2/9  Decreased Interest 0  Down,  Depressed, Hopeless 0  PHQ - 2 Score 0  Altered sleeping 0  Tired, decreased energy 0  Change in appetite 1  Feeling bad or failure about yourself  0  Trouble concentrating 0  Moving slowly or fidgety/restless 0  Suicidal thoughts 0  PHQ-9 Score 1     {Show previous vital signs (optional):23777}    ASSESSMENT/PLAN:   HTN (hypertension) Previously well-controlled off of medication but now uncontrolled. - restart losartan 50 mg - advised to monitor BP at home - check BMP in 2 weeks    Return in about 2 weeks (around 11/08/2022) for RN visit BP check + labs.   [name], MD [facility]

## 2022-10-25 NOTE — Patient Instructions (Addendum)
It was nice seeing you today!  Start taking the losartan 50 mg once a day. Record your blood pressure at least a few times a week and write them down.  Come back in 2 weeks for a nurse visit for blood pressure check and blood work.  Stay well, [name], MD [place] [phone]  --  Make sure to check out at the front desk before you leave today.  Please arrive at least 15 minutes prior to your scheduled appointments.  If you had blood work today, I will send you a MyChart message or a letter if results are normal. Otherwise, I will give you a call.  If you had a referral placed, they will call you to set up an appointment. Please give [name] a call if you don't hear back in the next 2 weeks.  If you need additional refills before your next appointment, please call your pharmacy first.  -- Blood Pressure Record Sheet To take your blood pressure, you will need a blood pressure machine. You can buy a blood pressure machine (blood pressure monitor) at your clinic, drug store, or online. When choosing one, consider: An automatic monitor that has an arm cuff. A cuff that wraps snugly around your upper arm. You should be able to fit only one finger between your arm and the cuff. A device that stores blood pressure reading results. Do not choose a monitor that measures your blood pressure from your wrist or finger. Follow your health care provider's instructions for how to take your blood pressure. To use this form: Take your blood pressure medications every day These measurements should be taken when you have been at rest for at least 10-15 min Take at least 2 readings with each blood pressure check. This makes sure the results are correct. Wait 1-2 minutes between measurements. Write down the results in the spaces on this form. Keep in mind it should always be recorded systolic over diastolic. Both numbers are important.  Repeat this every day for 2-3 weeks, or  as told by your health care provider.  Make a follow-up appointment with your health care provider to discuss the results.  Blood Pressure Log Date Medications taken? (Y/N) Blood Pressure Time of Day

## 2022-10-29 DIAGNOSIS — R1311 Dysphagia, oral phase: Secondary | ICD-10-CM | POA: Diagnosis not present

## 2022-10-31 ENCOUNTER — Telehealth: Payer: Self-pay

## 2022-10-31 NOTE — Telephone Encounter (Signed)
        Patient  visited [facility] on 12/26    Telephone encounter attempt :  1st  A HIPAA compliant voice message was left requesting a return call.  Instructed patient to call back .   [place], Care Management  [phone] [address], [place], [place] [address] Phone: [phone] Email: [email or web address]

## 2022-11-01 ENCOUNTER — Telehealth: Payer: Self-pay

## 2022-11-01 ENCOUNTER — Ambulatory Visit: Payer: Medicare HMO | Admitting: Student

## 2022-11-01 NOTE — Telephone Encounter (Signed)
     Patient  visit on 12/26  at [facility] you been able to follow up with your primary care physician? Yes   The patient was or was not able to obtain any needed medicine or equipment. Yes   Are there diet recommendations that you are having difficulty following? Na   Patient expresses understanding of discharge instructions and education provided has no other needs at this time.  Yes     ED Follow Up call

## 2022-11-02 ENCOUNTER — Ambulatory Visit (INDEPENDENT_AMBULATORY_CARE_PROVIDER_SITE_OTHER): Payer: Medicare HMO | Admitting: Neurology

## 2022-11-02 ENCOUNTER — Encounter: Payer: Self-pay | Admitting: Neurology

## 2022-11-02 ENCOUNTER — Ambulatory Visit: Payer: Medicare HMO | Attending: Audiologist | Admitting: Speech Pathology

## 2022-11-02 VITALS — BP 183/85 | HR 96 | Ht 61.0 in | Wt 113.0 lb

## 2022-11-02 DIAGNOSIS — R1312 Dysphagia, oropharyngeal phase: Secondary | ICD-10-CM

## 2022-11-02 DIAGNOSIS — R1310 Dysphagia, unspecified: Secondary | ICD-10-CM | POA: Insufficient documentation

## 2022-11-02 DIAGNOSIS — G523 Disorders of hypoglossal nerve: Secondary | ICD-10-CM | POA: Diagnosis not present

## 2022-11-02 NOTE — Patient Instructions (Signed)
Friday January 19 at 2:45  GOAL: eat more, tube feed less  Eat foods which you feel comfortable and confident eating Small challenges   Take your time Chew and then chew some more  Keep doing your swallow exercises

## 2022-11-02 NOTE — Patient Instructions (Signed)
If your swallowing gets worse, please contact my office for a sooner visit.  I will see you back in 3-4 months

## 2022-11-02 NOTE — Therapy (Signed)
OUTPATIENT SPEECH LANGUAGE PATHOLOGY SWALLOW TREATMENT    Patient Name: Sara [name] MRN: [id number] DOB:[birth year], 82 y.o., female Today's Date: 11/02/2022  PCP: Erskine Emery, MD / Leeanne Rio, MD REFERRING PROVIDER: Leeanne Rio, MD  END OF SESSION:  End of Session - 11/02/22 1100     Visit Number 3    Number of Visits 6    Date for SLP Re-Evaluation 11/21/22    Authorization Type [employer]    Progress Note Due on Visit 10    SLP Start Time 1100    SLP Stop Time  1133    SLP Time Calculation (min) 33 min    Activity Tolerance Patient tolerated treatment well              Past Medical History:  Diagnosis Date   Allergy    Dizziness 02/21/2018   Estrogen deficiency 01/13/2018   Need for prophylactic vaccination against Streptococcus pneumoniae (pneumococcus) 01/13/2018   Right knee pain 05/29/2019   Routine general medical examination at a health care facility 01/13/2018   Seasonal allergies 06/21/2020   Vaginal itching 06/21/2020   Vision changes 01/13/2018   Past Surgical History:  Procedure Laterality Date   DG THUMB RIGHT HAND ([facility] HX) Right    EYE SURGERY     IR GASTROSTOMY TUBE MOD SED  09/05/2022   TUBAL LIGATION     Patient Active Problem List   Diagnosis Date Noted   Gastroesophageal reflux disease 09/17/2022   Lung nodule 09/17/2022   Excessive oral secretions 09/17/2022   Protein-calorie malnutrition, severe 09/06/2022   Dysphagia 09/03/2022   Oral phase dysphagia 09/02/2022   Tongue mass 08/30/2022   Acute rhinitis 08/24/2022   Bilateral hearing loss 07/30/2022   Murmur, cardiac 06/01/2022   Tinnitus 06/30/2021   Atherosclerotic vascular disease 09/16/2020   Need for immunization against influenza 09/16/2020   Stage 3a chronic kidney disease (CKD) ([place]) 09/14/2020   Neuropathic pain of both legs 06/23/2018   HTN (hypertension) 02/21/2018   Healthcare maintenance 01/13/2018   Type 2 diabetes mellitus without  complication, with long-term current use of insulin ([place]) 01/13/2018    ONSET DATE: referral [facility] 09/17/2022   REFERRING DIAG: R13.11 (ICD-10-CM) - Oral phase dysphagia   THERAPY DIAG:  Dysphagia, unspecified type  Rationale for Evaluation and Treatment: Rehabilitation  SUBJECTIVE:   SUBJECTIVE STATEMENT: "It's been going better"   PAIN:  Are you having pain? No  OBJECTIVE:   RECOMMENDATIONS FROM OBJECTIVE SWALLOW STUDY (MBSS/FEES) 10-10-2022: "Pt presents with pharyngeal dysphagia characterized by reduction in tongue base retraction, anterior laryngeal movement, and pharyngeal stripping. She demonstrated base of tongue residue, vallecular residue, and pyriform sinus residue." Recommendations: liquid wash, single sips, dry swallow, moistened meats  PATIENT REPORTED OUTCOME MEASURES (PROM): EAT-10: 30   TODAY'S TREATMENT:     DYSPHAGIA TREATMENT 11-02-2022:   Current diet: regular, Dysphagia 3 (mechanical soft), and thin liquids Swallow precautions: chew thoroughly, alternate bites and sips, and limit distractions while eating Patient directly observed with POs: Yes: thin liquids  Feeding: able to feed self Liquids provided by: cup Oral phase signs and symptoms:  none Pharyngeal phase signs and symptoms:  none Therapeutic exercises: Effortful Swallow  Types of cueing: verbal and visual Amount of cueing: usual  minimal Treatment comments: Pt demonstrates swallow precautions with mod-I over x7 bolus trials. No overt s/sx of aspiration with pt reporting smooth transient following swallow. Pt has not increased PO intake since last session, SLP advised pt is able to consume  food PO, needs to increase intake to facilitate PEG removal. Provided education on ways to increase intake: fill plate with easier to eat foods with incorporation of few slightly challenging consistencies, several small meals. Advised to monitor weight to ensure maintaining current healthy weight. Pt verbalizes  understanding via teach back with occasional need for questioning cues. Handout provided.    10-03-22: SLP provided education on observations made during clinical swallow evaluation and recommendations. SLP recommends implementation of Dysphagia 1 diet at this time, with consideration for Dysphagia 2 with caution. Recommend increasing oral intake over course of next few weeks to aid in improved swallow function and confidence and ability to decrease tube feeds with end goal to meet nutrition needs orally. Discussion re: food options within dysphagia 1 diet. Education on swallow compensations and strategies. Collaborated with pt and daughter to generate plan of care and goals. All questions answered to satisfaction, pt able to teach back swallow strategies and diet recommendations.   PATIENT EDUCATION: Education details: see above Person educated: Patient and daughter Education method: Explanation, Demonstration, and Handouts Education comprehension: verbalized understanding, returned demonstration, and needs further education   ASSESSMENT:  CLINICAL IMPRESSION: Patient is a 82 y.o. F who was seen today for dysphagia evaluation. Pt with prior MBSS evidencing pharyngeal dysphagia and pt report of difficulty swallowing all textures and own secretions. Ongoing 1+ month. Has been taking thin liquids PO, using small sips. Has tried cream of chicken soup without difficulties. Able to clear thin liquid (including 2 sequential sips), puree, pudding, and fruit cup boluses this date without overt difficulty. Denies globus sensation or odynophagia with all PO trials, no overt s/sx of aspiration. Appears to be suitable for upgrade to dysphagia 1 diet. At onset, voice changes noted (weaker and pitch changed) with some (variable) improvement endorsed. Slight tremor noted with sustained phonation, prior to PO intake. Pt would benefit from skilled ST to assess ongoing diet tolerance, rehabilitate pt's swallow function,  and advance diet (texture and quantity) to facilitate potential removal of PEG tube. Etiology of pt's dysphagia remains unclear, SLP recommends consideration for OP neurology consult and f/u MBSS to assess pt's current swallow function and safety based on improvement evidenced and endorsed.   OBJECTIVE IMPAIRMENTS: include dysphagia. These impairments are limiting patient from safety when swallowing. Factors affecting potential to achieve goals and functional outcome are medical prognosis unknown and unknown etiology. Patient will benefit from skilled SLP services to address above impairments and improve overall function.  REHAB POTENTIAL: Good   GOALS: Goals reviewed with patient? Yes  SHORT TERM GOALS: Target date: 13/2024  Pt will complete f/u MBSS to assess current status of swallow function and safety  Baseline: 10-10-22 Goal status: MET  2.  Pt will achieve 25% hydration/nutrition via PO  Baseline:  Goal status: MET  3.  Pt will demonstrate compliance with swallow precautions, strategies, and compensations during all PO trials over 2 session with rare min-A Baseline:  Goal status: MET   LONG TERM GOALS: Target date: 11/21/2022  Pt will follow swallow precautions & diet modifications with supervision cues over 1 week  Baseline:  Goal status: INITIAL  2.  Pt will achieve 50% hydration/nutrition via PO  Baseline:  Goal status: INITIAL  3.  Pt will tolerate dysphagia 2 textures without overt s/sx of aspiration or oral phase dysphagia over 1 week period  Baseline:  Goal status: INITIAL  4.  Pt will improve score on EAT - 10 by 4 points by d/c Baseline:  Goal  status: INITIAL   PLAN:  SLP FREQUENCY: 1x/week  SLP DURATION: 8 weeks  PLANNED INTERVENTIONS: Aspiration precaution training, Pharyngeal strengthening exercises, Diet toleration management , Environmental controls, Trials of upgraded texture/liquids, Cueing hierachy, Internal/external aids, SLP instruction and  feedback, Compensatory strategies, Patient/family education, and Re-evaluation    Su Monks, [place] 11/02/2022, 11:00 AM

## 2022-11-02 NOTE — Progress Notes (Signed)
[facility] Clinic Note - Initial Visit   Date: 11/02/2022   Sara [name] MRN: [id number] DOB: [birth year]   Dear Dr. Erin Hearing:  Thank you for your kind referral of Sara [name] for consultation of dysphagia. Although her history is well known to you, please allow [name] to reiterate it for the purpose of our medical record. The patient was accompanied to the clinic by self.    [name] is a 82 y.o. right-handed female with hyperlipidemia, diabetes mellitus, CKD stage 3, and hypertension presenting for evaluation of dysphagia.   IMPRESSION/PLAN: Right hypoglossal nerve palsy, likely diabetic.  No evidence of stroke involving the medulla.  Exam shows mild weakness of tongue protrusion to the left with tongue deviation to the right, mild right sided atrophy.  No other cranial nerve palsy or limb weakness/sensory changes are appreciated.  Additional testing such as NCS/EMG and repeat MRI neck was discussed, however, given that she is having improvement of swallowing function, patient prefers to monitor symptoms.  We agreed that testing can be ordered, if she has any deterioration of symptoms or develops new symptoms. Management is supportive. In the meantime, she will continue to see speech therapy.  She is hopeful to reduce her tube feeds and start to take more by mouth.  She will be seeing speech therapy later today.   Elevated blood pressure, asymptomatic.  Recommend monitoring at home and follow-up with PCP.   Return to clinic in 3-4 months   ------------------------------------------------------------- History of present illness: She was admitted to [facility] in November with one-month history of dysphagia and 20lb weight loss.  She reports having sudden onset of dysphagia and felt the right tongue was swollen.  MRI neck shows edema involving the right side of the done concerning for denervation process.  No mass was seen.  MRI brain did not  show acute stroke to explain symptoms.  Based on her symptoms, right hypoglossal nerve injury was suspected.  Due to her poor PO intake, she underwent PEG placement for tube feeds. When she was hospitalized, she was only able to eat broth and jello.  Over the past few months, her swallowing has improved and she has less fullness of the tongue an able drink liquids, eat soft foods, soup, and puddings without choking spells.  Since her hospitalization, she has been having elevated blood pressure.  She is monitoring it at home and noticed that it is staying 160-170s.  Today, her BP is very high at 183/85.  She denies dizziness, headache, vision changes, shortness of breath or chest pain.   Out-side paper records, electronic medical record, and images have been reviewed where available and summarized as:  Lab Results  Component Value Date   HGBA1C 7.3 (A) 07/30/2022    Past Medical History:  Diagnosis Date   Allergy    Dizziness 02/21/2018   Estrogen deficiency 01/13/2018   Need for prophylactic vaccination against Streptococcus pneumoniae (pneumococcus) 01/13/2018   Right knee pain 05/29/2019   Routine general medical examination at a health care facility 01/13/2018   Seasonal allergies 06/21/2020   Vaginal itching 06/21/2020   Vision changes 01/13/2018    Past Surgical History:  Procedure Laterality Date   DG THUMB RIGHT HAND ([facility] HX) Right    EYE SURGERY     IR GASTROSTOMY TUBE MOD SED  09/05/2022   TUBAL LIGATION       Medications:  Outpatient Encounter Medications as of 11/02/2022  Medication Sig   Accu-Chek Softclix  Lancets lancets Please use to check blood sugar up to 4 times daily.   atorvastatin (LIPITOR) 40 MG tablet Place 1 tablet (40 mg total) into feeding tube daily.   blood glucose meter kit and supplies KIT Dispense based on patient and insurance preference. Use up to four times daily as directed.   cetirizine (ZYRTEC) 10 MG tablet Take 1 tablet (10 mg total) by mouth daily as  needed for allergies.   diclofenac Sodium (VOLTAREN) 1 % GEL Apply 2 g topically 4 (four) times daily. (Patient taking differently: Apply 2 g topically daily as needed (For pain).)   esomeprazole (NEXIUM) 10 MG packet Take 10 mg by mouth daily before breakfast.   Gabapentin 25 MG TABS Give 100 mg by tube at bedtime.   glucose blood (ACCU-CHEK GUIDE) test strip USE 1  4 TIMES DAILY   Insulin Pen Needle (PEN NEEDLES 3/16") 31G X 5 MM MISC Use as instructed to inject insulin once daily   losartan (COZAAR) 50 MG tablet Take 1 tablet (50 mg total) by mouth at bedtime.   metFORMIN (GLUCOPHAGE) 500 MG tablet Place 2 tablets (1,000 mg total) into feeding tube 2 (two) times daily with a meal.   Nutritional Supplements (FEEDING SUPPLEMENT, GLUCERNA 1.2 CAL,) LIQD Place 1,000 mLs into feeding tube continuous.   Nutritional Supplements (FEEDING SUPPLEMENT, KATE FARMS STANDARD 1.4,) LIQD liquid Per nutritionist. Previously on Glucerna 328m 4x/d (~6 cartons per day), 660mof free water before and after each bolus feed (12029mx/d)   ondansetron (ZOFRAN) 8 MG tablet TAKE 1 TABLET BY MOUTH EVERY 8 HOURS AS NEEDED FOR UP TO 5 DAYS FOR NAUSEA FOR VOMITING   Oral Hygiene Products (Q-CARE Q2 ORAL CLEANS/SUCTION) KIT Use as directed 1 kit in the mouth or throat as needed.   thiamine (VITAMIN B-1) 100 MG tablet Place 1 tablet (100 mg total) into feeding tube daily.   cephALEXin (KEFLEX) 250 MG/5ML suspension Place 10 mLs (500 mg total) into feeding tube 2 (two) times daily for 10 days. (Patient not taking: Reported on 11/02/2022)   phosphorus (K PHOS NEUTRAL) 155-852-130 MG tablet Place 1 tablet (250 mg total) into feeding tube 2 (two) times daily. (Patient not taking: Reported on 11/02/2022)   No facility-administered encounter medications on file as of 11/02/2022.    Allergies: No Known Allergies  Family History: Family History  Problem Relation Age of Onset   Heart disease Mother    Diabetes Mother    Other Mother         PacPsychologist, forensicAlzheimer's disease Father    Cancer Sister    Cancer Brother    Alzheimer's disease Brother    Breast cancer Neg Hx     Social History: Social History   Tobacco Use   Smoking status: Never   Smokeless tobacco: Never  Vaping Use   Vaping Use: Never used  Substance Use Topics   Alcohol use: Never   Drug use: Never   Social History   Social History Narrative   [place].    Patient still drives, however limits where she goes.    Patient is retired.    Patient enjoys crosswords, working in her yard, spending time with family, and she is very active in her church.       Right Handed    Lives in a one story home. Lives with daughter and great grandson.     Vital Signs:  BP (!)[place] Kitchen  183/85   Pulse 96   Ht '5\' 1"'$  (1.549 m)   Wt 113 lb (51.3 kg)   SpO2 99%   BMI 21.35 kg/m   Neurological Exam: MENTAL STATUS including orientation to time, place, person, recent and remote memory, attention span and concentration, language, and fund of knowledge is normal.  Speech is not dysarthric.  CRANIAL NERVES: II:  No visual field defects.    III-IV-VI: Pupils equal round and reactive to light.  Normal conjugate, extra-ocular eye movements in all directions of gaze.  No nystagmus.  No ptosis.   V:  Normal facial sensation.    VII:  Normal facial symmetry and movements.   VIII:  Normal hearing and vestibular function.   IX-X:  Normal palatal movement.   XI:  Normal shoulder shrug and head rotation.   XII:  Mild tongue weakness with protrusion to the left; tongue is mildly deviated to the right.  Range of motion intact.  No fasciculations are seen.  MOTOR:  Motor strength is 5/5 throughout. No atrophy, fasciculations or abnormal movements.  No pronator drift.   REFLEXES:  Reflexes are 2+/4 throughout  SENSORY:  Normal and symmetric perception of light touch, pinprick, vibration, and proprioception.     COORDINATION/GAIT: Normal finger-to- nose-finger.  Intact rapid alternating movements bilaterally.  Gait narrow based and stable.    Total time spent reviewing records, interview, history/exam, documentation, and coordination of care on day of encounter:  45 min   Thank you for allowing me to participate in patient's care.  If I can answer any additional questions, I would be pleased to do so.    Sincerely,    Dashawn Bartnick K. Posey Pronto, DO

## 2022-11-07 DIAGNOSIS — R338 Other retention of urine: Secondary | ICD-10-CM | POA: Diagnosis not present

## 2022-11-07 DIAGNOSIS — N819 Female genital prolapse, unspecified: Secondary | ICD-10-CM | POA: Diagnosis not present

## 2022-11-09 ENCOUNTER — Other Ambulatory Visit: Payer: Self-pay | Admitting: Family Medicine

## 2022-11-12 NOTE — Patient Instructions (Addendum)
It was nice seeing you today!  Take HCTZ once a day in addition to your losartan. Keep checking your blood pressure at home and write them down.  Stay well, [name], MD [place] [phone]  --  Make sure to check out at the front desk before you leave today.  Please arrive at least 15 minutes prior to your scheduled appointments.  If you had blood work today, I will send you a MyChart message or a letter if results are normal. Otherwise, I will give you a call.  If you had a referral placed, they will call you to set up an appointment. Please give [name] a call if you don't hear back in the next 2 weeks.  If you need additional refills before your next appointment, please call your pharmacy first.

## 2022-11-12 NOTE — Progress Notes (Signed)
    SUBJECTIVE:   CHIEF COMPLAINT / HPI:  Chief Complaint  Patient presents with   Follow-up    2wk    I last saw patient about 2 weeks ago for hypertension follow-up she was not well-controlled at that time, I resumed her losartan at 50 mg which she was previously on.  Reports her home BP readings: 150s-160s/80s. She has a BP log with her today.  PERTINENT  PMH / PSH: HTN, dysphagia s/p PEG tube, T2DM, CKD stage IIIa  Patient Care Team: Erskine Emery, MD as PCP - General (Family Medicine) Alda Berthold, DO as Consulting Physician (Neurology)   OBJECTIVE:   BP (!) 157/78   Pulse 89   Ht '5\' 1"'$  (1.549 m)   Wt 114 lb 4 oz (51.8 kg)   SpO2 100%   BMI 21.59 kg/m   Physical Exam Constitutional:      General: She is not in acute distress. Cardiovascular:     Rate and Rhythm: Normal rate and regular rhythm.  Pulmonary:     Effort: Pulmonary effort is normal. No respiratory distress.     Breath sounds: Normal breath sounds.  Musculoskeletal:     Cervical back: Neck supple.  Neurological:     Mental Status: She is alert.         11/14/2022    8:48 AM  Depression screen PHQ 2/9  Decreased Interest 0  Down, Depressed, Hopeless 0  PHQ - 2 Score 0  Altered sleeping 0  Tired, decreased energy 0  Change in appetite 0  Feeling bad or failure about yourself  0  Trouble concentrating 0  Moving slowly or fidgety/restless 0  Suicidal thoughts 0  PHQ-9 Score 0     {Show previous vital signs (optional):23777}    ASSESSMENT/PLAN:   HTN (hypertension) Chronic, remains uncontrolled in office as well as ambulatory BP readings despite restarting losartan. - continue losartan 50 mg - start HCTZ 25 mg - BMP today - f/u 2 weeks for BP check + labs   Insulin pen needles refilled per patient request  Return in about 2 weeks (around 11/28/2022) for f/u HTN.   [name], MD [place]

## 2022-11-14 ENCOUNTER — Encounter: Payer: Self-pay | Admitting: Family Medicine

## 2022-11-14 ENCOUNTER — Ambulatory Visit (INDEPENDENT_AMBULATORY_CARE_PROVIDER_SITE_OTHER): Payer: Medicare HMO | Admitting: Family Medicine

## 2022-11-14 VITALS — BP 157/78 | HR 89 | Ht 61.0 in | Wt 114.2 lb

## 2022-11-14 DIAGNOSIS — I10 Essential (primary) hypertension: Secondary | ICD-10-CM

## 2022-11-14 DIAGNOSIS — Z794 Long term (current) use of insulin: Secondary | ICD-10-CM

## 2022-11-14 DIAGNOSIS — E119 Type 2 diabetes mellitus without complications: Secondary | ICD-10-CM

## 2022-11-14 MED ORDER — "PEN NEEDLES 3/16"" 31G X 5 MM MISC": 3 refills | Status: DC

## 2022-11-14 MED ORDER — HYDROCHLOROTHIAZIDE 25 MG PO TABS: 25.0000 mg | ORAL_TABLET | Freq: Every day | ORAL | 1 refills | Status: DC

## 2022-11-14 NOTE — Assessment & Plan Note (Signed)
Chronic, remains uncontrolled in office as well as ambulatory BP readings despite restarting losartan. - continue losartan 50 mg - start HCTZ 25 mg - BMP today - f/u 2 weeks for BP check + labs

## 2022-11-15 LAB — SPECIMEN STATUS REPORT

## 2022-11-16 ENCOUNTER — Ambulatory Visit: Payer: Medicare HMO | Admitting: Speech Pathology

## 2022-11-16 DIAGNOSIS — R1310 Dysphagia, unspecified: Secondary | ICD-10-CM

## 2022-11-16 LAB — BASIC METABOLIC PANEL
BUN/Creatinine Ratio: 13 (ref 12–28)
BUN: 11 mg/dL (ref 8–27)
CO2: 25 mmol/L (ref 20–29)
Calcium: 10 mg/dL (ref 8.7–10.3)
Chloride: 105 mmol/L (ref 96–106)
Creatinine, Ser: 0.85 mg/dL (ref 0.57–1.00)
Glucose: 90 mg/dL (ref 70–99)
Potassium: 4.1 mmol/L (ref 3.5–5.2)
Sodium: 142 mmol/L (ref 134–144)
eGFR: 69 mL/min/{1.73_m2} (ref >59)

## 2022-11-16 NOTE — Patient Instructions (Signed)
Great job eating food!  Today we went over the variety of foods you've been able to safely eat without difficulty.  I think you are ready to try eating all your nutrition by mouth. I'd recommend ceasing tube feeds and trying to get all your calories by eating real food.   Let your body tell you what you need. If you're hungry, eat! Save the tube feed for absolute last case scenario--- you've tried to meet your hunger by eating, you're still starving, and you just can't eat anymore.   It may be worthwhile to try completely eliminating tube feeds for a week or two while monitoring your weight. If everything goes well (weight stays up, you're able to eat without difficulties, you're not coughing or choking or getting overly fatigued when eating), I think you'll be able to meet with your doctor to discuss having the tube removed.   It is OK to make your foods softer if that makes it easier.   If you have hard foods, make sure you focus, chew, chew, chew... then swallow  Continue to maintain your swallow precautions: Take your time Small bites, small sips Limit distractions so you can focus on chewing and swallowing Eat softer foods primarily if that's more comfortable and add in harder foods in smaller doses  Continue to do thorough oral care Wash down solids with a drink to clear any residue  Make sure your mouth is clear at the end of meals or snacks

## 2022-11-16 NOTE — Therapy (Signed)
OUTPATIENT SPEECH LANGUAGE PATHOLOGY SWALLOW TREATMENT    Patient Name: Sara [name] MRN: [id number] DOB:1941-02-07, 82 y.o., female Today's Date: 11/16/2022  PCP: Erskine Emery, MD / Leeanne Rio, MD REFERRING PROVIDER: Leeanne Rio, MD  END OF SESSION:  End of Session - 11/16/22 1439     Visit Number 4    Number of Visits 6    Date for SLP Re-Evaluation 11/21/22    Authorization Type [employer]    Progress Note Due on Visit 10    SLP Start Time 1439    SLP Stop Time  30    SLP Time Calculation (min) 33 min    Activity Tolerance Patient tolerated treatment well              Past Medical History:  Diagnosis Date   Allergy    Dizziness 02/21/2018   Estrogen deficiency 01/13/2018   Need for prophylactic vaccination against Streptococcus pneumoniae (pneumococcus) 01/13/2018   Right knee pain 05/29/2019   Routine general medical examination at a health care facility 01/13/2018   Seasonal allergies 06/21/2020   Vaginal itching 06/21/2020   Vision changes 01/13/2018   Past Surgical History:  Procedure Laterality Date   DG THUMB RIGHT HAND ([facility] HX) Right    EYE SURGERY     IR GASTROSTOMY TUBE MOD SED  09/05/2022   TUBAL LIGATION     Patient Active Problem List   Diagnosis Date Noted   Gastroesophageal reflux disease 09/17/2022   Lung nodule 09/17/2022   Excessive oral secretions 09/17/2022   Protein-calorie malnutrition, severe 09/06/2022   Dysphagia 09/03/2022   Oral phase dysphagia 09/02/2022   Tongue mass 08/30/2022   Acute rhinitis 08/24/2022   Bilateral hearing loss 07/30/2022   Murmur, cardiac 06/01/2022   Tinnitus 06/30/2021   Atherosclerotic vascular disease 09/16/2020   Need for immunization against influenza 09/16/2020   Stage 3a chronic kidney disease (CKD) ([place]) 09/14/2020   Neuropathic pain of both legs 06/23/2018   HTN (hypertension) 02/21/2018   Healthcare maintenance 01/13/2018   Type 2 diabetes mellitus without  complication, with long-term current use of insulin ([place]) 01/13/2018    ONSET DATE: referral [facility] 09/17/2022   REFERRING DIAG: R13.11 (ICD-10-CM) - Oral phase dysphagia   THERAPY DIAG:  Dysphagia, unspecified type  Rationale for Evaluation and Treatment: Rehabilitation  SUBJECTIVE:   SUBJECTIVE STATEMENT: "It's going great, it really is"   PAIN:  Are you having pain? No  OBJECTIVE:   RECOMMENDATIONS FROM OBJECTIVE SWALLOW STUDY (MBSS/FEES) 10-10-2022: "Pt presents with pharyngeal dysphagia characterized by reduction in tongue base retraction, anterior laryngeal movement, and pharyngeal stripping. She demonstrated base of tongue residue, vallecular residue, and pyriform sinus residue." Recommendations: liquid wash, single sips, dry swallow, moistened meats  PATIENT REPORTED OUTCOME MEASURES (PROM): EAT-10: 30   TODAY'S TREATMENT:    11-16-2022: Pt reporting everything she is eating is going down well, no overt difficulties reported. Able to swallow liquids and solids. Is modifying texture to be softer to aid in ease of mastication/deglutition. When asked, pt reports she has increased her oral intake and has cut back her tube feeds. Pt is PO breakfast, tube feed for lunch, PO snack, PO dinner, tube feed in the evening late evening, PO evening snack. SLP advised pt she can try to cease tubefeeds to A in ability to potentially remove PEG should PO continue to progress without difficulties and pt is able to meet nutritional needs PO. SLP re-educated on swallow precautions and strategies with pt able to  verbalize understanding via teach back with mod-I. Handout provided to support all education provided this date.    10-03-22: SLP provided education on observations made during clinical swallow evaluation and recommendations. SLP recommends implementation of Dysphagia 1 diet at this time, with consideration for Dysphagia 2 with caution. Recommend increasing oral intake over course of next  few weeks to aid in improved swallow function and confidence and ability to decrease tube feeds with end goal to meet nutrition needs orally. Discussion re: food options within dysphagia 1 diet. Education on swallow compensations and strategies. Collaborated with pt and daughter to generate plan of care and goals. All questions answered to satisfaction, pt able to teach back swallow strategies and diet recommendations.   PATIENT EDUCATION: Education details: see above Person educated: Patient and daughter Education method: Explanation, Demonstration, and Handouts Education comprehension: verbalized understanding, returned demonstration, and needs further education   ASSESSMENT:  CLINICAL IMPRESSION: Patient is a 82 y.o. F who was seen today for dysphagia evaluation. Pt with prior MBSS evidencing pharyngeal dysphagia and pt report of difficulty swallowing all textures and own secretions. Ongoing 1+ month. Has been taking thin liquids PO, using small sips. Has tried cream of chicken soup without difficulties. Able to clear thin liquid (including 2 sequential sips), puree, pudding, and fruit cup boluses this date without overt difficulty. Denies globus sensation or odynophagia with all PO trials, no overt s/sx of aspiration. Appears to be suitable for upgrade to dysphagia 1 diet. At onset, voice changes noted (weaker and pitch changed) with some (variable) improvement endorsed. Slight tremor noted with sustained phonation, prior to PO intake. Pt would benefit from skilled ST to assess ongoing diet tolerance, rehabilitate pt's swallow function, and advance diet (texture and quantity) to facilitate potential removal of PEG tube. Etiology of pt's dysphagia remains unclear, SLP recommends consideration for OP neurology consult and f/u MBSS to assess pt's current swallow function and safety based on improvement evidenced and endorsed.   OBJECTIVE IMPAIRMENTS: include dysphagia. These impairments are limiting  patient from safety when swallowing. Factors affecting potential to achieve goals and functional outcome are medical prognosis unknown and unknown etiology. Patient will benefit from skilled SLP services to address above impairments and improve overall function.  REHAB POTENTIAL: Good   GOALS: Goals reviewed with patient? Yes  SHORT TERM GOALS: Target date: 11/10/2022  Pt will complete f/u MBSS to assess current status of swallow function and safety  Baseline: 10-10-22 Goal status: MET  2.  Pt will achieve 25% hydration/nutrition via PO  Baseline:  Goal status: MET  3.  Pt will demonstrate compliance with swallow precautions, strategies, and compensations during all PO trials over 2 session with rare min-A Baseline:  Goal status: MET   LONG TERM GOALS: Target date: 11/21/2022  Pt will follow swallow precautions & diet modifications with supervision cues over 1 week  Baseline:  Goal status: INITIAL  2.  Pt will achieve 50% hydration/nutrition via PO  Baseline:  Goal status: INITIAL  3.  Pt will tolerate dysphagia 2 textures without overt s/sx of aspiration or oral phase dysphagia over 1 week period  Baseline:  Goal status: INITIAL  4.  Pt will improve score on EAT - 10 by 4 points by d/c Baseline:  Goal status: INITIAL   PLAN:  SLP FREQUENCY: 1x/week  SLP DURATION: 8 weeks  PLANNED INTERVENTIONS: Aspiration precaution training, Pharyngeal strengthening exercises, Diet toleration management , Environmental controls, Trials of upgraded texture/liquids, Cueing hierachy, Internal/external aids, SLP instruction and feedback, Compensatory  strategies, Patient/family education, and Re-evaluation    Su Monks, [place] 11/16/2022, 3:05 PM

## 2022-11-19 ENCOUNTER — Telehealth: Payer: Self-pay

## 2022-11-19 DIAGNOSIS — Z931 Gastrostomy status: Secondary | ICD-10-CM

## 2022-11-19 NOTE — Addendum Note (Signed)
Addended by: Erskine Emery on: 11/19/2022 12:00 PM   Modules accepted: Orders

## 2022-11-19 NOTE — Telephone Encounter (Signed)
Patient calls nurse line regarding request for G tube removal. She reports that her SLP told her that she is feeding well by mouth and feels that she no longer has a need for it.   Patient is requesting advice in next steps for removal.   Will forward to PCP.   Talbot Grumbling, RN

## 2022-11-19 NOTE — Telephone Encounter (Signed)
Returned call to patient. She did not answer. LVM asking her to return call to provide with update.   Talbot Grumbling, RN

## 2022-11-21 ENCOUNTER — Other Ambulatory Visit (HOSPITAL_COMMUNITY): Payer: Self-pay | Admitting: Family Medicine

## 2022-11-21 DIAGNOSIS — Z931 Gastrostomy status: Secondary | ICD-10-CM

## 2022-11-23 DIAGNOSIS — H9193 Unspecified hearing loss, bilateral: Secondary | ICD-10-CM | POA: Diagnosis not present

## 2022-11-23 DIAGNOSIS — R1311 Dysphagia, oral phase: Secondary | ICD-10-CM | POA: Diagnosis not present

## 2022-11-23 DIAGNOSIS — I10 Essential (primary) hypertension: Secondary | ICD-10-CM | POA: Diagnosis not present

## 2022-11-23 DIAGNOSIS — Z79899 Other long term (current) drug therapy: Secondary | ICD-10-CM | POA: Diagnosis not present

## 2022-11-23 DIAGNOSIS — I700 Atherosclerosis of aorta: Secondary | ICD-10-CM | POA: Diagnosis not present

## 2022-11-23 DIAGNOSIS — E119 Type 2 diabetes mellitus without complications: Secondary | ICD-10-CM | POA: Diagnosis not present

## 2022-11-23 DIAGNOSIS — K219 Gastro-esophageal reflux disease without esophagitis: Secondary | ICD-10-CM | POA: Diagnosis not present

## 2022-11-23 DIAGNOSIS — I6529 Occlusion and stenosis of unspecified carotid artery: Secondary | ICD-10-CM | POA: Diagnosis not present

## 2022-11-23 DIAGNOSIS — R918 Other nonspecific abnormal finding of lung field: Secondary | ICD-10-CM | POA: Diagnosis not present

## 2022-11-28 ENCOUNTER — Ambulatory Visit (INDEPENDENT_AMBULATORY_CARE_PROVIDER_SITE_OTHER): Payer: Medicare HMO | Admitting: Family Medicine

## 2022-11-28 ENCOUNTER — Encounter: Payer: Self-pay | Admitting: Family Medicine

## 2022-11-28 VITALS — BP 142/64 | HR 85 | Ht 61.0 in | Wt 115.6 lb

## 2022-11-28 DIAGNOSIS — I10 Essential (primary) hypertension: Secondary | ICD-10-CM | POA: Diagnosis not present

## 2022-11-28 DIAGNOSIS — Z931 Gastrostomy status: Secondary | ICD-10-CM

## 2022-11-28 NOTE — Assessment & Plan Note (Signed)
Improved but not quite at goal.  With shared decision making decided no medication changes today, she will work on lifestyle changes. - continue losartan 50 mg - continue HCTZ 25 mg - BMP today - DASH diet handout provided - counseled on diet and exercise

## 2022-11-28 NOTE — Progress Notes (Signed)
    SUBJECTIVE:   CHIEF COMPLAINT / HPI:  Chief Complaint  Patient presents with   Follow-up    I last saw this patient 2 weeks ago for blood pressure follow-up, she was uncontrolled and so I started her on HCTZ 25 mg in addition to her losartan 50 mg that she was on.   She has noticed increased urination. Ambulatory BP readings reviewed, mostly 150s/80s with some 140s and some 160s.  PERTINENT  PMH / PSH: HTN, dysphagia s/p PEG tube, T2DM, CKD stage IIIa  Patient Care Team: Erskine Emery, MD as PCP - General (Family Medicine) Alda Berthold, DO as Consulting Physician (Neurology)   OBJECTIVE:   BP (!) 142/64   Pulse 85   Ht '5\' 1"'$  (1.549 m)   Wt 115 lb 9.6 oz (52.4 kg)   SpO2 100%   BMI 21.84 kg/m   Physical Exam Constitutional:      General: She is not in acute distress. Cardiovascular:     Rate and Rhythm: Normal rate and regular rhythm.  Pulmonary:     Effort: Pulmonary effort is normal. No respiratory distress.     Breath sounds: Normal breath sounds.  Musculoskeletal:     Cervical back: Neck supple.  Neurological:     Mental Status: She is alert.         11/28/2022   10:40 AM  Depression screen PHQ 2/9  Decreased Interest 0  Down, Depressed, Hopeless 0  PHQ - 2 Score 0  Altered sleeping 0  Tired, decreased energy 0  Change in appetite 0  Feeling bad or failure about yourself  0  Trouble concentrating 0  Moving slowly or fidgety/restless 0  Suicidal thoughts 0  PHQ-9 Score 0  Difficult doing work/chores Not difficult at all     {Show previous vital signs (optional):23777}    ASSESSMENT/PLAN:   HTN (hypertension) Improved but not quite at goal.  With shared decision making decided no medication changes today, she will work on lifestyle changes. - continue losartan 50 mg - continue HCTZ 25 mg - BMP today - DASH diet handout provided - counseled on diet and exercise   IR gastrostomy tube removal future order was actually released, I have  replaced order for this.  Return in about 4 weeks (around 12/26/2022) for f/u HTN.   [name], MD [place]

## 2022-11-28 NOTE — Patient Instructions (Addendum)
It was nice seeing you today!  Try to work in increasing exercise and reducing salt/sodium in your diet.  Goal blood pressure is < 140/90.  No changes to your medications today.  Stay well, [name], MD [place] [phone]  --  Make sure to check out at the front desk before you leave today.  Please arrive at least 15 minutes prior to your scheduled appointments.  If you had blood work today, I will send you a MyChart message or a letter if results are normal. Otherwise, I will give you a call.  If you had a referral placed, they will call you to set up an appointment. Please give [name] a call if you don't hear back in the next 2 weeks.  If you need additional refills before your next appointment, please call your pharmacy first.  -- Blood Pressure Record Sheet To take your blood pressure, you will need a blood pressure machine. You can buy a blood pressure machine (blood pressure monitor) at your clinic, drug store, or online. When choosing one, consider: An automatic monitor that has an arm cuff. A cuff that wraps snugly around your upper arm. You should be able to fit only one finger between your arm and the cuff. A device that stores blood pressure reading results. Do not choose a monitor that measures your blood pressure from your wrist or finger. Follow your health care provider's instructions for how to take your blood pressure. To use this form: Take your blood pressure medications every day These measurements should be taken when you have been at rest for at least 10-15 min Take at least 2 readings with each blood pressure check. This makes sure the results are correct. Wait 1-2 minutes between measurements. Write down the results in the spaces on this form. Keep in mind it should always be recorded systolic over diastolic. Both numbers are important.  Repeat this every day for 2-3 weeks, or as told by your health care provider.  Make a  follow-up appointment with your health care provider to discuss the results.  Blood Pressure Log Date Medications taken? (Y/N) Blood Pressure Time of Day

## 2022-11-30 LAB — BASIC METABOLIC PANEL
BUN/Creatinine Ratio: 12 (ref 12–28)
BUN: 12 mg/dL (ref 8–27)
CO2: 21 mmol/L (ref 20–29)
Calcium: 9.9 mg/dL (ref 8.7–10.3)
Chloride: 103 mmol/L (ref 96–106)
Creatinine, Ser: 1 mg/dL (ref 0.57–1.00)
Glucose: 128 mg/dL — ABNORMAL HIGH (ref 70–99)
Potassium: 4 mmol/L (ref 3.5–5.2)
Sodium: 142 mmol/L (ref 134–144)
eGFR: 57 mL/min/{1.73_m2} — ABNORMAL LOW (ref >59)

## 2022-12-04 ENCOUNTER — Ambulatory Visit (HOSPITAL_COMMUNITY)
Admission: RE | Admit: 2022-12-04 | Discharge: 2022-12-04 | Disposition: A | Discharge status: Home / Self Care | Payer: Medicare HMO | Source: Ambulatory Visit | Attending: Family Medicine | Admitting: Family Medicine

## 2022-12-04 DIAGNOSIS — Z431 Encounter for attention to gastrostomy: Secondary | ICD-10-CM | POA: Diagnosis not present

## 2022-12-04 HISTORY — PX: IR GASTROSTOMY TUBE REMOVAL: IMG5492

## 2022-12-04 MED ORDER — LIDOCAINE VISCOUS HCL 2 % MT SOLN
OROMUCOSAL | Status: AC
  Filled 2022-12-04: qty 15

## 2023-01-14 ENCOUNTER — Telehealth: Payer: Self-pay | Admitting: Student

## 2023-01-14 NOTE — Telephone Encounter (Signed)
Contacted Sara [name] to schedule their annual wellness visit. Appointment made for 01/18/2023.  Thank you,  [place] Direct dial  [phone]

## 2023-01-17 NOTE — Patient Instructions (Signed)

## 2023-01-17 NOTE — Progress Notes (Signed)
I attempted to contact the patient for her scheduled Virtual Telephone Annual Wellness Visit. No answer. I left a detailed message on the patient's personal voicemail stating her name to return my call [phone].

## 2023-01-18 ENCOUNTER — Ambulatory Visit (INDEPENDENT_AMBULATORY_CARE_PROVIDER_SITE_OTHER): Payer: Medicare PPO

## 2023-01-18 DIAGNOSIS — Z0000 Encounter for general adult medical examination without abnormal findings: Secondary | ICD-10-CM

## 2023-01-24 ENCOUNTER — Other Ambulatory Visit: Payer: Self-pay | Admitting: Student

## 2023-01-24 DIAGNOSIS — R918 Other nonspecific abnormal finding of lung field: Secondary | ICD-10-CM

## 2023-02-04 ENCOUNTER — Encounter: Payer: Self-pay | Admitting: Neurology

## 2023-02-04 ENCOUNTER — Ambulatory Visit: Payer: Medicare PPO | Admitting: Neurology

## 2023-02-04 DIAGNOSIS — Z029 Encounter for administrative examinations, unspecified: Secondary | ICD-10-CM

## 2023-02-21 ENCOUNTER — Encounter: Payer: Self-pay | Admitting: Student

## 2023-02-28 ENCOUNTER — Other Ambulatory Visit: Payer: Medicare PPO

## 2023-03-28 ENCOUNTER — Other Ambulatory Visit: Payer: Medicare PPO
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
## 2023-03-29 ENCOUNTER — Ambulatory Visit (INDEPENDENT_AMBULATORY_CARE_PROVIDER_SITE_OTHER): Payer: Medicare PPO | Admitting: Podiatry

## 2023-03-29 ENCOUNTER — Encounter: Payer: Self-pay | Admitting: Podiatry

## 2023-03-29 DIAGNOSIS — E119 Type 2 diabetes mellitus without complications: Secondary | ICD-10-CM | POA: Diagnosis not present

## 2023-03-29 DIAGNOSIS — B351 Tinea unguium: Secondary | ICD-10-CM

## 2023-03-29 DIAGNOSIS — N1831 Chronic kidney disease, stage 3a: Secondary | ICD-10-CM | POA: Diagnosis not present

## 2023-03-29 DIAGNOSIS — M79674 Pain in right toe(s): Secondary | ICD-10-CM

## 2023-03-29 DIAGNOSIS — M79675 Pain in left toe(s): Secondary | ICD-10-CM

## 2023-03-29 DIAGNOSIS — Z794 Long term (current) use of insulin: Secondary | ICD-10-CM

## 2023-03-29 NOTE — Progress Notes (Signed)
This patient presents to my office for at risk foot care.  This patient requires this care by a professional since this patient will be at risk due to having diabetes, CKD and neuropathy This patient is unable to cut nails herself since the patient cannot reach her nails.These nails are painful walking and wearing shoes.  This patient presents for at risk foot care today.  General Appearance  Alert, conversant and in no acute stress.  Vascular  Dorsalis pedis and posterior tibial  pulses are weakly  palpable  bilaterally.  Capillary return is within normal limits  bilaterally. Temperature is within normal limits  bilaterally.  Neurologic  Senn-Weinstein monofilament wire test within normal limits  bilaterally. Muscle power within normal limits bilaterally.  Nails Thick disfigured discolored nails with subungual debris  from hallux to fifth toes bilaterally. No evidence of bacterial infection or drainage bilaterally.  Orthopedic  No limitations of motion  feet .  No crepitus or effusions noted.  No bony pathology or digital deformities noted. HAV  B/L.  Skin  normotropic skin with no porokeratosis noted bilaterally.  No signs of infections or ulcers noted.     Onychomycosis  Pain in right toes  Pain in left toes  Consent was obtained for treatment procedures.   Mechanical debridement of nails 1-5  bilaterally performed with a nail nipper.  Filed with dremel without incident.    Return office visit    3 months                  Told patient to return for periodic foot care and evaluation due to potential at risk complications.   Helane Gunther DPM

## 2023-04-01 ENCOUNTER — Other Ambulatory Visit: Payer: Self-pay | Admitting: Family Medicine

## 2023-04-11 ENCOUNTER — Other Ambulatory Visit: Payer: Self-pay

## 2023-04-11 NOTE — Progress Notes (Signed)
   Sara [name] 29-Sep-1941 [id number]  Patient outreached by Minette Brine , PharmD Candidate on 04/11/2023.  Blood Pressure Readings: Last documented ambulatory systolic blood pressure: 140 Last documented ambulatory diastolic blood pressure: 76 Does the patient have a validated home blood pressure machine?: Yes  Medication review was performed. Is the patient taking their medications as prescribed?: Yes Differences from their prescribed list include: Pt is requesting refills on her insulin and gabapentin 25mg  tabs.  The following barriers to adherence were noted: Does the patient have cost concerns?: No Does the patient have transportation concerns?: No Does the patient need assistance obtaining refills?: Yes (wants gabapentin and insulin refilled) Does the patient occassionally forget to take some of their prescribed medications?: No Does the patient feel like one/some of their medications make them feel poorly?: No Does the patient have questions or concerns about their medications?: No Does the patient have a follow up scheduled with their primary care provider/cardiologist?: Yes   Interventions: Interventions Completed: Medications were reviewed, Patient was educated on goal blood pressures and long term health implications of elevated blood pressure, Patient was educated on medications, including indication and administration, Patient was counseled on lifestyle modifications to improve blood pressure, including  The patient has follow up scheduled:  PCP: Alfredo Martinez, MD   Minette Brine, Student-PharmD

## 2023-04-12 ENCOUNTER — Encounter: Payer: Self-pay | Admitting: Student

## 2023-04-12 ENCOUNTER — Ambulatory Visit (INDEPENDENT_AMBULATORY_CARE_PROVIDER_SITE_OTHER): Payer: Medicare PPO | Admitting: Student

## 2023-04-12 VITALS — BP 130/62 | HR 68 | Ht 61.0 in | Wt 117.0 lb

## 2023-04-12 DIAGNOSIS — Z794 Long term (current) use of insulin: Secondary | ICD-10-CM | POA: Diagnosis not present

## 2023-04-12 DIAGNOSIS — E119 Type 2 diabetes mellitus without complications: Secondary | ICD-10-CM | POA: Diagnosis not present

## 2023-04-12 DIAGNOSIS — R911 Solitary pulmonary nodule: Secondary | ICD-10-CM | POA: Diagnosis not present

## 2023-04-12 LAB — POCT GLYCOSYLATED HEMOGLOBIN (HGB A1C): HbA1c, POC (controlled diabetic range): 6.7 % (ref 0.0–7.0)

## 2023-04-12 NOTE — Assessment & Plan Note (Signed)
Hemoglobin A 1C is 6.7 today.  Stop all insulin at this time.  Continue only with metformin.  She can continue discussions with her new PCP.

## 2023-04-12 NOTE — Progress Notes (Signed)
    SUBJECTIVE:   Chief compliant/HPI: annual examination  JONI HOVELL is a 82 y.o. who presents today for an annual exam.   DM2: Metformin 500 two tabs twice a day   Has insulin to use when "her sugars get high"  History tabs reviewed and updated.   Review of systems form reviewed and negative.  Patient reports that she is here today because she would like to release her records to her new PCP as her new PCP has transportation for her.  New PCP is [facility]  She reports that she does have a CT chest already ordered with radiology.  OBJECTIVE:   BP 130/62   Pulse 68   Ht 5\' 1"  (1.549 m)   Wt 117 lb (53.1 kg)   SpO2 99%   BMI 22.11 kg/m   General: Alert and oriented in no apparent distress Heart: Regular rate and rhythm with no murmurs appreciated Lungs: Normal WOB Abdomen: no abdominal pain Skin: Warm and dry Extremities: No lower extremity edema   ASSESSMENT/PLAN:   Lung nodule Has CT scan already ordered for 99-month follow-up on lung nodules.  Discussed with patient.  Type 2 diabetes mellitus without complication, with long-term current use of insulin (HCC) Hemoglobin A 1C is 6.7 today.  Stop all insulin at this time.  Continue only with metformin.  She can continue discussions with her new PCP.    Annual Examination  See AVS for age appropriate recommendations.  PHQ score     04/12/2023    2:38 PM 11/28/2022   10:40 AM 11/14/2022    8:48 AM 10/25/2022    9:37 AM 09/17/2022    2:07 PM  Depression screen PHQ 2/9  Decreased Interest 0 0 0 0 0  Down, Depressed, Hopeless 0 0 0 0 0  PHQ - 2 Score 0 0 0 0 0  Altered sleeping 0 0 0 0 0  Tired, decreased energy 0 0 0 0 1  Change in appetite 0 0 0 1 0  Feeling bad or failure about yourself  0 0 0 0 0  Trouble concentrating 0 0 0 0 0  Moving slowly or fidgety/restless 0 0 0 0 0  Suicidal thoughts 0 0 0 0 0  PHQ-9 Score 0 0 0 1 1  Difficult doing work/chores  Not difficult at all     reviewed  and discussed.  Blood pressure value is at goal, discussed.   Considered the following screening exams based upon USPSTF recommendations: Diabetes screening: ordered Screening for elevated cholesterol: Not today  Hepatitis C:  negative--<0.1 Syphilis if at high risk:  Not HR Reviewed risk factors for latent tuberculosis and not indicated Colorectal cancer screening: Not indicated Lung cancer screening: CT chest repeat ordered, recommended 6 month follow up  DEXA scan:  AP Spine  L1-L2       02/10/2018    76.9         -2.0    0.930 g/cm2 Dual Femur Total Right 02/10/2018    76.9         -0.5    0.945 g/cm2    We have greatly enjoyed taking care of Ms. Coy Saunas, we will assist her in any way she needs with PCP transition.   Record release signed and will be sent.    Alfredo Martinez, MD [facility]

## 2023-04-12 NOTE — Assessment & Plan Note (Signed)
Has CT scan already ordered for 9-month follow-up on lung nodules.  Discussed with patient.

## 2023-04-12 NOTE — Patient Instructions (Addendum)
It was great to see you today! Thank you for choosing [facility] for your primary care. Sara [name] was seen for .  Today we addressed: ONLY use your Metformin 2 tabs twice a day Stop the insulin  We will send your records to your new doctors office.  It has been so nice taking care of you!!  If you haven't already, sign up for My Chart to have easy access to your labs results, and communication with your primary care physician.  I recommend that you always bring your medications to each appointment as this makes it easy to ensure you are on the correct medications and helps [name] not miss refills when you need them. Call the clinic at [phone] if your symptoms worsen or you have any concerns.  You should return to our clinic Return for Changing to new PCP. Please arrive 15 minutes before your appointment to ensure smooth check in process.  We appreciate your efforts in making this happen.  Thank you for allowing me to participate in your care, Alfredo Martinez, MD 04/12/2023, 2:58 PM PGY-2, [facility]

## 2023-04-15 ENCOUNTER — Other Ambulatory Visit: Payer: Self-pay

## 2023-04-15 ENCOUNTER — Encounter: Payer: Self-pay | Admitting: Neurology

## 2023-04-15 ENCOUNTER — Ambulatory Visit (INDEPENDENT_AMBULATORY_CARE_PROVIDER_SITE_OTHER): Payer: Medicare PPO | Admitting: Neurology

## 2023-04-15 VITALS — BP 125/65 | HR 83 | Ht 61.0 in | Wt 119.0 lb

## 2023-04-15 DIAGNOSIS — G523 Disorders of hypoglossal nerve: Secondary | ICD-10-CM

## 2023-04-15 NOTE — Patient Instructions (Signed)
It was lovely to see you today!

## 2023-04-15 NOTE — Progress Notes (Signed)
Follow-up Visit   Date: 04/15/2023    Sara [name] MRN: [id number] DOB: 11-22-1940    Sara [name] is a 82 y.o. right-handed female with hyperlipidemia, diabetes mellitus, CKD stage 3, and hypertension returning to the clinic for follow-up of hypoglossal nerve palsy.  The patient was accompanied to the clinic by self.   IMPRESSION/PLAN: Right hypoglossal nerve palsy, likely diabetic.  She has made significant improvement since her last visit and had PEG removed in 11/2022.  She denies any swallowing problems and is back to eat regular textured food.  Diabetes is under good control with last HbA1c 6.7.  Continue to monitor   Return to clinic as needed  --------------------------------------------- History of present illness: She was admitted to [facility] in November with one-month history of dysphagia and 20lb weight loss.  She reports having sudden onset of dysphagia and felt the right tongue was swollen.  MRI neck shows edema involving the right side of the done concerning for denervation process.  No mass was seen.  MRI brain did not show acute stroke to explain symptoms.  Based on her symptoms, right hypoglossal nerve injury was suspected.  Due to her poor PO intake, she underwent PEG placement for tube feeds. When she was hospitalized, she was only able to eat broth and jello.  Over the past few months, her swallowing has improved and she has less fullness of the tongue an able drink liquids, eat soft foods, soup, and puddings without choking spells.   UPDATE 04/15/2023:  She is here for follow-up visit. She is doing great and swallowing much better.  PEG was removed months ago and she is back to eating regular food.  She remains highly independent and active at home.   No new complaints.    Medications:  Current Outpatient Medications on File Prior to Visit  Medication Sig Dispense Refill   Accu-Chek Softclix Lancets lancets Please use to check blood sugar up to 4  times daily. 400 each 1   atorvastatin (LIPITOR) 40 MG tablet Place 1 tablet (40 mg total) into feeding tube daily. 90 tablet 0   blood glucose meter kit and supplies KIT Dispense based on patient and insurance preference. Use up to four times daily as directed. 1 each 0   cetirizine (ZYRTEC) 10 MG tablet Take 1 tablet (10 mg total) by mouth daily as needed for allergies. 30 tablet 11   diclofenac Sodium (VOLTAREN) 1 % GEL Apply 2 g topically 4 (four) times daily. 100 g 1   esomeprazole (NEXIUM) 10 MG packet Take 10 mg by mouth daily before breakfast. 30 each 12   Gabapentin 25 MG TABS Give 100 mg by tube at bedtime. 30 tablet 0   glucose blood (ACCU-CHEK GUIDE) test strip USE 1  4 TIMES DAILY 400 each 0   hydrochlorothiazide (HYDRODIURIL) 25 MG tablet Take 1 tablet (25 mg total) by mouth daily. 30 tablet 1   Insulin Pen Needle (PEN NEEDLES 3/16") 31G X 5 MM MISC Use as instructed to inject insulin once daily 90 each 3   losartan (COZAAR) 50 MG tablet TAKE 1 TABLET BY MOUTH AT BEDTIME 90 tablet 0   metFORMIN (GLUCOPHAGE) 500 MG tablet Place 2 tablets (1,000 mg total) into feeding tube 2 (two) times daily with a meal. 360 tablet 0   Oral Hygiene Products (Q-CARE Q2 ORAL CLEANS/SUCTION) KIT Use as directed 1 kit in the mouth or throat as needed. 1 kit 1   Nutritional Supplements (FEEDING  SUPPLEMENT, KATE FARMS STANDARD 1.4,) LIQD liquid Per nutritionist. Previously on Glucerna 4x/d (~6 cartons per day), 60mL of free water before and after each bolus feed ( 4x/d) (Patient not taking: Reported on 04/15/2023) 325 mL 2   ondansetron (ZOFRAN) 8 MG tablet TAKE 1 TABLET BY MOUTH EVERY 8 HOURS AS NEEDED FOR UP TO 5 DAYS FOR NAUSEA FOR VOMITING (Patient not taking: Reported on 04/15/2023) 15 tablet 0   No current facility-administered medications on file prior to visit.    Allergies: No Known Allergies  Vital Signs:  BP 125/65   Pulse 83   Ht 5\' 1"  (1.549 m)   Wt 119 lb (54 kg)   SpO2 93%    BMI 22.48 kg/m    Neurological Exam: MENTAL STATUS including orientation to time, place, person, recent and remote memory, attention span and concentration, language, and fund of knowledge is normal.  Speech is not dysarthric.  CRANIAL NERVES:  No visual field defects.  Pupils equal round and reactive to light.  Normal conjugate, extra-ocular eye movements in all directions of gaze.  No ptosis.  Face is symmetric. Palate elevates symmetrically.  Tongue is mildly deviated to the right, trace weakness with movement to the left.  MOTOR:  Motor strength is 5/5 in all extremities.  No atrophy, fasciculations or abnormal movements.  No pronator drift.  Tone is normal.    MSRs:  Reflexes are 2+/4 throughout .   COORDINATION/GAIT:  Gait narrow based and stable.   Data:  Lab Results  Component Value Date   HGBA1C 6.7 04/12/2023     Thank you for allowing me to participate in patient's care.  If I can answer any additional questions, I would be pleased to do so.    Sincerely,    La Dibella K. Allena Katz, DO

## 2023-04-17 ENCOUNTER — Encounter (INDEPENDENT_AMBULATORY_CARE_PROVIDER_SITE_OTHER): Payer: Medicare PPO | Admitting: Ophthalmology

## 2023-04-17 DIAGNOSIS — H43813 Vitreous degeneration, bilateral: Secondary | ICD-10-CM

## 2023-04-17 DIAGNOSIS — H2512 Age-related nuclear cataract, left eye: Secondary | ICD-10-CM

## 2023-04-17 DIAGNOSIS — I10 Essential (primary) hypertension: Secondary | ICD-10-CM | POA: Diagnosis not present

## 2023-04-17 DIAGNOSIS — H35033 Hypertensive retinopathy, bilateral: Secondary | ICD-10-CM | POA: Diagnosis not present

## 2023-04-17 DIAGNOSIS — E113313 Type 2 diabetes mellitus with moderate nonproliferative diabetic retinopathy with macular edema, bilateral: Secondary | ICD-10-CM | POA: Diagnosis not present

## 2023-04-17 DIAGNOSIS — Z7984 Long term (current) use of oral hypoglycemic drugs: Secondary | ICD-10-CM

## 2023-05-17 ENCOUNTER — Encounter (INDEPENDENT_AMBULATORY_CARE_PROVIDER_SITE_OTHER): Payer: Medicare PPO | Admitting: Ophthalmology

## 2023-05-17 DIAGNOSIS — Z7984 Long term (current) use of oral hypoglycemic drugs: Secondary | ICD-10-CM | POA: Diagnosis not present

## 2023-05-17 DIAGNOSIS — E113313 Type 2 diabetes mellitus with moderate nonproliferative diabetic retinopathy with macular edema, bilateral: Secondary | ICD-10-CM | POA: Diagnosis not present

## 2023-05-17 DIAGNOSIS — H35033 Hypertensive retinopathy, bilateral: Secondary | ICD-10-CM

## 2023-05-17 DIAGNOSIS — I10 Essential (primary) hypertension: Secondary | ICD-10-CM | POA: Diagnosis not present

## 2023-05-17 DIAGNOSIS — H43813 Vitreous degeneration, bilateral: Secondary | ICD-10-CM

## 2023-06-14 ENCOUNTER — Encounter (INDEPENDENT_AMBULATORY_CARE_PROVIDER_SITE_OTHER): Payer: Medicare PPO | Admitting: Ophthalmology

## 2023-06-14 DIAGNOSIS — E113313 Type 2 diabetes mellitus with moderate nonproliferative diabetic retinopathy with macular edema, bilateral: Secondary | ICD-10-CM

## 2023-06-14 DIAGNOSIS — H35033 Hypertensive retinopathy, bilateral: Secondary | ICD-10-CM

## 2023-06-14 DIAGNOSIS — Z7984 Long term (current) use of oral hypoglycemic drugs: Secondary | ICD-10-CM | POA: Diagnosis not present

## 2023-06-14 DIAGNOSIS — I10 Essential (primary) hypertension: Secondary | ICD-10-CM | POA: Diagnosis not present

## 2023-06-14 DIAGNOSIS — H43813 Vitreous degeneration, bilateral: Secondary | ICD-10-CM

## 2023-07-05 ENCOUNTER — Ambulatory Visit (INDEPENDENT_AMBULATORY_CARE_PROVIDER_SITE_OTHER): Payer: Medicare PPO | Admitting: Podiatry

## 2023-07-05 DIAGNOSIS — M79674 Pain in right toe(s): Secondary | ICD-10-CM

## 2023-07-05 DIAGNOSIS — M79675 Pain in left toe(s): Secondary | ICD-10-CM | POA: Diagnosis not present

## 2023-07-05 DIAGNOSIS — Z794 Long term (current) use of insulin: Secondary | ICD-10-CM

## 2023-07-05 DIAGNOSIS — B351 Tinea unguium: Secondary | ICD-10-CM

## 2023-07-05 DIAGNOSIS — E119 Type 2 diabetes mellitus without complications: Secondary | ICD-10-CM | POA: Diagnosis not present

## 2023-07-05 NOTE — Progress Notes (Signed)
This patient returns to my office for at risk foot care.  This patient requires this care by a professional since this patient will be at risk due to having diabetes.  This patient is unable to cut nails herself since the patient cannot reach her nails.These nails are painful walking and wearing shoes.  This patient presents for at risk foot care today.  General Appearance  Alert, conversant and in no acute stress.  Vascular  Dorsalis pedis and posterior tibial  pulses are weakly  palpable  bilaterally.  Capillary return is within normal limits  bilaterally. Temperature is within normal limits  bilaterally.  Neurologic  Senn-Weinstein monofilament wire test within normal limits  bilaterally. Muscle power within normal limits bilaterally.  Nails Thick disfigured discolored nails with subungual debris  from hallux to fifth toes bilaterally. No evidence of bacterial infection or drainage bilaterally.  Orthopedic  No limitations of motion  feet .  No crepitus or effusions noted.  No bony pathology or digital deformities noted.  HAV   B/L.  Skin  normotropic skin with no porokeratosis noted bilaterally.  No signs of infections or ulcers noted.     Onychomycosis  Pain in right toes  Pain in left toes  Consent was obtained for treatment procedures.   Mechanical debridement of nails 1-5  bilaterally performed with a nail nipper.  Filed with dremel without incident.    Return office visit   3 months                   Told patient to return for periodic foot care and evaluation due to potential at risk complications.   Gardiner Barefoot DPM

## 2023-07-12 ENCOUNTER — Encounter (INDEPENDENT_AMBULATORY_CARE_PROVIDER_SITE_OTHER): Payer: Medicare PPO | Admitting: Ophthalmology

## 2023-07-12 DIAGNOSIS — Z7984 Long term (current) use of oral hypoglycemic drugs: Secondary | ICD-10-CM | POA: Diagnosis not present

## 2023-07-12 DIAGNOSIS — E113313 Type 2 diabetes mellitus with moderate nonproliferative diabetic retinopathy with macular edema, bilateral: Secondary | ICD-10-CM | POA: Diagnosis not present

## 2023-07-12 DIAGNOSIS — H35033 Hypertensive retinopathy, bilateral: Secondary | ICD-10-CM | POA: Diagnosis not present

## 2023-07-12 DIAGNOSIS — H43813 Vitreous degeneration, bilateral: Secondary | ICD-10-CM

## 2023-07-12 DIAGNOSIS — I10 Essential (primary) hypertension: Secondary | ICD-10-CM | POA: Diagnosis not present

## 2023-07-23 ENCOUNTER — Telehealth: Payer: Self-pay | Admitting: Student

## 2023-07-23 NOTE — Telephone Encounter (Signed)
Contacted Sara [name] to schedule their annual wellness visit. Patient declined to schedule AWV at this time.  I spoke to patient's daughter, [name].  Patient has transferred to [facility].  Thank you,  [facility] Support [facility] Direct dial  [phone]

## 2023-08-09 ENCOUNTER — Encounter (INDEPENDENT_AMBULATORY_CARE_PROVIDER_SITE_OTHER): Payer: Medicare PPO | Admitting: Ophthalmology

## 2023-08-09 DIAGNOSIS — I10 Essential (primary) hypertension: Secondary | ICD-10-CM

## 2023-08-09 DIAGNOSIS — H43813 Vitreous degeneration, bilateral: Secondary | ICD-10-CM

## 2023-08-09 DIAGNOSIS — H35033 Hypertensive retinopathy, bilateral: Secondary | ICD-10-CM

## 2023-08-09 DIAGNOSIS — E113313 Type 2 diabetes mellitus with moderate nonproliferative diabetic retinopathy with macular edema, bilateral: Secondary | ICD-10-CM

## 2023-08-09 DIAGNOSIS — Z7984 Long term (current) use of oral hypoglycemic drugs: Secondary | ICD-10-CM | POA: Diagnosis not present

## 2023-09-06 ENCOUNTER — Encounter (INDEPENDENT_AMBULATORY_CARE_PROVIDER_SITE_OTHER): Payer: Medicare PPO | Admitting: Ophthalmology

## 2023-09-06 DIAGNOSIS — I10 Essential (primary) hypertension: Secondary | ICD-10-CM | POA: Diagnosis not present

## 2023-09-06 DIAGNOSIS — Z7984 Long term (current) use of oral hypoglycemic drugs: Secondary | ICD-10-CM

## 2023-09-06 DIAGNOSIS — E113313 Type 2 diabetes mellitus with moderate nonproliferative diabetic retinopathy with macular edema, bilateral: Secondary | ICD-10-CM | POA: Diagnosis not present

## 2023-09-06 DIAGNOSIS — H2512 Age-related nuclear cataract, left eye: Secondary | ICD-10-CM

## 2023-09-06 DIAGNOSIS — H43813 Vitreous degeneration, bilateral: Secondary | ICD-10-CM

## 2023-09-06 DIAGNOSIS — H35033 Hypertensive retinopathy, bilateral: Secondary | ICD-10-CM | POA: Diagnosis not present

## 2023-10-04 ENCOUNTER — Encounter: Payer: Self-pay | Admitting: Podiatry

## 2023-10-04 ENCOUNTER — Ambulatory Visit (INDEPENDENT_AMBULATORY_CARE_PROVIDER_SITE_OTHER): Payer: Medicare HMO | Admitting: Podiatry

## 2023-10-04 DIAGNOSIS — Z794 Long term (current) use of insulin: Secondary | ICD-10-CM

## 2023-10-04 DIAGNOSIS — M79675 Pain in left toe(s): Secondary | ICD-10-CM | POA: Diagnosis not present

## 2023-10-04 DIAGNOSIS — B351 Tinea unguium: Secondary | ICD-10-CM

## 2023-10-04 DIAGNOSIS — M79674 Pain in right toe(s): Secondary | ICD-10-CM

## 2023-10-04 DIAGNOSIS — E119 Type 2 diabetes mellitus without complications: Secondary | ICD-10-CM

## 2023-10-04 NOTE — Progress Notes (Signed)
This patient returns to my office for at risk foot care.  This patient requires this care by a professional since this patient will be at risk due to having diabetes.  This patient is unable to cut nails herself since the patient cannot reach her nails.These nails are painful walking and wearing shoes.  This patient presents for at risk foot care today.  General Appearance  Alert, conversant and in no acute stress.  Vascular  Dorsalis pedis and posterior tibial  pulses are weakly  palpable  bilaterally.  Capillary return is within normal limits  bilaterally. Temperature is within normal limits  bilaterally.  Neurologic  Senn-Weinstein monofilament wire test within normal limits  bilaterally. Muscle power within normal limits bilaterally.  Nails Thick disfigured discolored nails with subungual debris  from hallux to fifth toes bilaterally. No evidence of bacterial infection or drainage bilaterally.  Orthopedic  No limitations of motion  feet .  No crepitus or effusions noted.  No bony pathology or digital deformities noted.  HAV   B/L.  Skin  normotropic skin with no porokeratosis noted bilaterally.  No signs of infections or ulcers noted.     Onychomycosis  Pain in right toes  Pain in left toes  Consent was obtained for treatment procedures.   Mechanical debridement of nails 1-5  bilaterally performed with a nail nipper.  Filed with dremel without incident.    Return office visit   3 months                   Told patient to return for periodic foot care and evaluation due to potential at risk complications.   Gardiner Barefoot DPM

## 2023-10-07 ENCOUNTER — Encounter (INDEPENDENT_AMBULATORY_CARE_PROVIDER_SITE_OTHER): Payer: Medicare HMO | Admitting: Ophthalmology

## 2023-10-07 DIAGNOSIS — H43813 Vitreous degeneration, bilateral: Secondary | ICD-10-CM

## 2023-10-07 DIAGNOSIS — Z7984 Long term (current) use of oral hypoglycemic drugs: Secondary | ICD-10-CM

## 2023-10-07 DIAGNOSIS — I10 Essential (primary) hypertension: Secondary | ICD-10-CM | POA: Diagnosis not present

## 2023-10-07 DIAGNOSIS — H2512 Age-related nuclear cataract, left eye: Secondary | ICD-10-CM

## 2023-10-07 DIAGNOSIS — H35033 Hypertensive retinopathy, bilateral: Secondary | ICD-10-CM

## 2023-10-07 DIAGNOSIS — E113313 Type 2 diabetes mellitus with moderate nonproliferative diabetic retinopathy with macular edema, bilateral: Secondary | ICD-10-CM | POA: Diagnosis not present

## 2023-11-04 ENCOUNTER — Encounter (INDEPENDENT_AMBULATORY_CARE_PROVIDER_SITE_OTHER): Payer: Medicare HMO | Admitting: Ophthalmology

## 2023-11-04 DIAGNOSIS — E113313 Type 2 diabetes mellitus with moderate nonproliferative diabetic retinopathy with macular edema, bilateral: Secondary | ICD-10-CM

## 2023-11-04 DIAGNOSIS — H35033 Hypertensive retinopathy, bilateral: Secondary | ICD-10-CM

## 2023-11-04 DIAGNOSIS — I10 Essential (primary) hypertension: Secondary | ICD-10-CM

## 2023-11-04 DIAGNOSIS — H43813 Vitreous degeneration, bilateral: Secondary | ICD-10-CM

## 2023-11-04 DIAGNOSIS — Z7984 Long term (current) use of oral hypoglycemic drugs: Secondary | ICD-10-CM

## 2023-12-02 ENCOUNTER — Encounter (INDEPENDENT_AMBULATORY_CARE_PROVIDER_SITE_OTHER): Payer: Medicare Other | Admitting: Ophthalmology

## 2023-12-02 DIAGNOSIS — E113313 Type 2 diabetes mellitus with moderate nonproliferative diabetic retinopathy with macular edema, bilateral: Secondary | ICD-10-CM

## 2023-12-02 DIAGNOSIS — Z7984 Long term (current) use of oral hypoglycemic drugs: Secondary | ICD-10-CM | POA: Diagnosis not present

## 2023-12-02 DIAGNOSIS — H35033 Hypertensive retinopathy, bilateral: Secondary | ICD-10-CM | POA: Diagnosis not present

## 2023-12-02 DIAGNOSIS — I10 Essential (primary) hypertension: Secondary | ICD-10-CM

## 2023-12-02 DIAGNOSIS — H43813 Vitreous degeneration, bilateral: Secondary | ICD-10-CM

## 2023-12-02 DIAGNOSIS — H2512 Age-related nuclear cataract, left eye: Secondary | ICD-10-CM

## 2023-12-30 ENCOUNTER — Encounter (INDEPENDENT_AMBULATORY_CARE_PROVIDER_SITE_OTHER): Payer: Medicare Other | Admitting: Ophthalmology

## 2024-01-03 ENCOUNTER — Encounter: Payer: Self-pay | Admitting: Podiatry

## 2024-01-03 ENCOUNTER — Ambulatory Visit (INDEPENDENT_AMBULATORY_CARE_PROVIDER_SITE_OTHER): Payer: Medicare HMO | Admitting: Podiatry

## 2024-01-03 DIAGNOSIS — M79674 Pain in right toe(s): Secondary | ICD-10-CM

## 2024-01-03 DIAGNOSIS — M79675 Pain in left toe(s): Secondary | ICD-10-CM | POA: Diagnosis not present

## 2024-01-03 DIAGNOSIS — B351 Tinea unguium: Secondary | ICD-10-CM | POA: Diagnosis not present

## 2024-01-03 DIAGNOSIS — E119 Type 2 diabetes mellitus without complications: Secondary | ICD-10-CM | POA: Diagnosis not present

## 2024-01-03 DIAGNOSIS — Z794 Long term (current) use of insulin: Secondary | ICD-10-CM | POA: Diagnosis not present

## 2024-01-03 NOTE — Progress Notes (Signed)
This patient returns to my office for at risk foot care.  This patient requires this care by a professional since this patient will be at risk due to having diabetes.  This patient is unable to cut nails herself since the patient cannot reach her nails.These nails are painful walking and wearing shoes.  This patient presents for at risk foot care today.  General Appearance  Alert, conversant and in no acute stress.  Vascular  Dorsalis pedis and posterior tibial  pulses are weakly  palpable  bilaterally.  Capillary return is within normal limits  bilaterally. Temperature is within normal limits  bilaterally.  Neurologic  Senn-Weinstein monofilament wire test within normal limits  bilaterally. Muscle power within normal limits bilaterally.  Nails Thick disfigured discolored nails with subungual debris  from hallux to fifth toes bilaterally. No evidence of bacterial infection or drainage bilaterally.  Orthopedic  No limitations of motion  feet .  No crepitus or effusions noted.  No bony pathology or digital deformities noted.  HAV   B/L.  Skin  normotropic skin with no porokeratosis noted bilaterally.  No signs of infections or ulcers noted.     Onychomycosis  Pain in right toes  Pain in left toes  Consent was obtained for treatment procedures.   Mechanical debridement of nails 1-5  bilaterally performed with a nail nipper.  Filed with dremel without incident.    Return office visit   3 months                   Told patient to return for periodic foot care and evaluation due to potential at risk complications.   Gardiner Barefoot DPM

## 2024-01-06 ENCOUNTER — Encounter (INDEPENDENT_AMBULATORY_CARE_PROVIDER_SITE_OTHER): Admitting: Ophthalmology

## 2024-01-06 DIAGNOSIS — H35033 Hypertensive retinopathy, bilateral: Secondary | ICD-10-CM

## 2024-01-06 DIAGNOSIS — I10 Essential (primary) hypertension: Secondary | ICD-10-CM

## 2024-01-06 DIAGNOSIS — H43813 Vitreous degeneration, bilateral: Secondary | ICD-10-CM

## 2024-01-06 DIAGNOSIS — Z7984 Long term (current) use of oral hypoglycemic drugs: Secondary | ICD-10-CM | POA: Diagnosis not present

## 2024-01-06 DIAGNOSIS — E113313 Type 2 diabetes mellitus with moderate nonproliferative diabetic retinopathy with macular edema, bilateral: Secondary | ICD-10-CM | POA: Diagnosis not present

## 2024-02-03 ENCOUNTER — Encounter (INDEPENDENT_AMBULATORY_CARE_PROVIDER_SITE_OTHER): Admitting: Ophthalmology

## 2024-02-21 ENCOUNTER — Encounter (INDEPENDENT_AMBULATORY_CARE_PROVIDER_SITE_OTHER): Admitting: Ophthalmology

## 2024-02-21 DIAGNOSIS — Z7984 Long term (current) use of oral hypoglycemic drugs: Secondary | ICD-10-CM | POA: Diagnosis not present

## 2024-02-21 DIAGNOSIS — H35033 Hypertensive retinopathy, bilateral: Secondary | ICD-10-CM

## 2024-02-21 DIAGNOSIS — I10 Essential (primary) hypertension: Secondary | ICD-10-CM | POA: Diagnosis not present

## 2024-02-21 DIAGNOSIS — E113313 Type 2 diabetes mellitus with moderate nonproliferative diabetic retinopathy with macular edema, bilateral: Secondary | ICD-10-CM | POA: Diagnosis not present

## 2024-02-21 DIAGNOSIS — H43813 Vitreous degeneration, bilateral: Secondary | ICD-10-CM

## 2024-03-26 ENCOUNTER — Encounter (HOSPITAL_BASED_OUTPATIENT_CLINIC_OR_DEPARTMENT_OTHER): Payer: Self-pay | Admitting: Emergency Medicine

## 2024-03-26 ENCOUNTER — Observation Stay (HOSPITAL_COMMUNITY)

## 2024-03-26 ENCOUNTER — Emergency Department (HOSPITAL_BASED_OUTPATIENT_CLINIC_OR_DEPARTMENT_OTHER)

## 2024-03-26 ENCOUNTER — Other Ambulatory Visit: Payer: Self-pay

## 2024-03-26 ENCOUNTER — Inpatient Hospital Stay (HOSPITAL_COMMUNITY)

## 2024-03-26 ENCOUNTER — Inpatient Hospital Stay (HOSPITAL_BASED_OUTPATIENT_CLINIC_OR_DEPARTMENT_OTHER)
Admission: EM | Admit: 2024-03-26 | Discharge: 2024-03-28 | DRG: 682 | Disposition: A | Discharge status: Home Health | Attending: Family Medicine | Admitting: Family Medicine

## 2024-03-26 DIAGNOSIS — E872 Acidosis, unspecified: Secondary | ICD-10-CM | POA: Diagnosis present

## 2024-03-26 DIAGNOSIS — E43 Unspecified severe protein-calorie malnutrition: Secondary | ICD-10-CM | POA: Diagnosis present

## 2024-03-26 DIAGNOSIS — N390 Urinary tract infection, site not specified: Secondary | ICD-10-CM | POA: Diagnosis present

## 2024-03-26 DIAGNOSIS — Z794 Long term (current) use of insulin: Secondary | ICD-10-CM

## 2024-03-26 DIAGNOSIS — Z8673 Personal history of transient ischemic attack (TIA), and cerebral infarction without residual deficits: Secondary | ICD-10-CM

## 2024-03-26 DIAGNOSIS — E875 Hyperkalemia: Principal | ICD-10-CM | POA: Diagnosis present

## 2024-03-26 DIAGNOSIS — E1141 Type 2 diabetes mellitus with diabetic mononeuropathy: Secondary | ICD-10-CM | POA: Diagnosis present

## 2024-03-26 DIAGNOSIS — N179 Acute kidney failure, unspecified: Principal | ICD-10-CM | POA: Diagnosis present

## 2024-03-26 DIAGNOSIS — R269 Unspecified abnormalities of gait and mobility: Secondary | ICD-10-CM | POA: Diagnosis present

## 2024-03-26 DIAGNOSIS — Z820 Family history of epilepsy and other diseases of the nervous system: Secondary | ICD-10-CM | POA: Diagnosis not present

## 2024-03-26 DIAGNOSIS — D75839 Thrombocytosis, unspecified: Secondary | ICD-10-CM | POA: Diagnosis present

## 2024-03-26 DIAGNOSIS — N1831 Chronic kidney disease, stage 3a: Secondary | ICD-10-CM | POA: Diagnosis present

## 2024-03-26 DIAGNOSIS — R2990 Unspecified symptoms and signs involving the nervous system: Secondary | ICD-10-CM | POA: Diagnosis not present

## 2024-03-26 DIAGNOSIS — R471 Dysarthria and anarthria: Secondary | ICD-10-CM | POA: Diagnosis present

## 2024-03-26 DIAGNOSIS — Z681 Body mass index (BMI) 19 or less, adult: Secondary | ICD-10-CM

## 2024-03-26 DIAGNOSIS — W010XXA Fall on same level from slipping, tripping and stumbling without subsequent striking against object, initial encounter: Secondary | ICD-10-CM | POA: Diagnosis present

## 2024-03-26 DIAGNOSIS — I129 Hypertensive chronic kidney disease with stage 1 through stage 4 chronic kidney disease, or unspecified chronic kidney disease: Secondary | ICD-10-CM | POA: Diagnosis present

## 2024-03-26 DIAGNOSIS — Z7984 Long term (current) use of oral hypoglycemic drugs: Secondary | ICD-10-CM | POA: Diagnosis not present

## 2024-03-26 DIAGNOSIS — Z8249 Family history of ischemic heart disease and other diseases of the circulatory system: Secondary | ICD-10-CM

## 2024-03-26 DIAGNOSIS — E1122 Type 2 diabetes mellitus with diabetic chronic kidney disease: Secondary | ICD-10-CM | POA: Diagnosis present

## 2024-03-26 DIAGNOSIS — Z833 Family history of diabetes mellitus: Secondary | ICD-10-CM | POA: Diagnosis not present

## 2024-03-26 DIAGNOSIS — R1310 Dysphagia, unspecified: Secondary | ICD-10-CM | POA: Diagnosis present

## 2024-03-26 DIAGNOSIS — D529 Folate deficiency anemia, unspecified: Secondary | ICD-10-CM | POA: Diagnosis present

## 2024-03-26 DIAGNOSIS — G8311 Monoplegia of lower limb affecting right dominant side: Secondary | ICD-10-CM | POA: Diagnosis present

## 2024-03-26 DIAGNOSIS — Z79899 Other long term (current) drug therapy: Secondary | ICD-10-CM

## 2024-03-26 DIAGNOSIS — K148 Other diseases of tongue: Secondary | ICD-10-CM | POA: Diagnosis present

## 2024-03-26 DIAGNOSIS — R531 Weakness: Secondary | ICD-10-CM | POA: Diagnosis present

## 2024-03-26 LAB — BASIC METABOLIC PANEL WITH GFR
Anion gap: 7 (ref 5–15)
BUN: 29 mg/dL — ABNORMAL HIGH (ref 8–23)
CO2: 19 mmol/L — ABNORMAL LOW (ref 22–32)
Calcium: 10.1 mg/dL (ref 8.9–10.3)
Chloride: 108 mmol/L (ref 98–111)
Creatinine, Ser: 1.94 mg/dL — ABNORMAL HIGH (ref 0.44–1.00)
GFR, Estimated: 25 mL/min — ABNORMAL LOW (ref >60)
Glucose, Bld: 111 mg/dL — ABNORMAL HIGH (ref 70–99)
Potassium: 5.4 mmol/L — ABNORMAL HIGH (ref 3.5–5.1)
Sodium: 134 mmol/L — ABNORMAL LOW (ref 135–145)

## 2024-03-26 LAB — COMPREHENSIVE METABOLIC PANEL WITH GFR
ALT: 10 U/L (ref 0–44)
AST: 16 U/L (ref 15–41)
Albumin: 3 g/dL — ABNORMAL LOW (ref 3.5–5.0)
Alkaline Phosphatase: 101 U/L (ref 38–126)
Anion gap: 11 (ref 5–15)
BUN: 32 mg/dL — ABNORMAL HIGH (ref 8–23)
CO2: 19 mmol/L — ABNORMAL LOW (ref 22–32)
Calcium: 10.8 mg/dL — ABNORMAL HIGH (ref 8.9–10.3)
Chloride: 103 mmol/L (ref 98–111)
Creatinine, Ser: 2.24 mg/dL — ABNORMAL HIGH (ref 0.44–1.00)
GFR, Estimated: 21 mL/min — ABNORMAL LOW (ref >60)
Glucose, Bld: 98 mg/dL (ref 70–99)
Potassium: 5.9 mmol/L — ABNORMAL HIGH (ref 3.5–5.1)
Sodium: 133 mmol/L — ABNORMAL LOW (ref 135–145)
Total Bilirubin: 0.4 mg/dL (ref 0.0–1.2)
Total Protein: 8.1 g/dL (ref 6.5–8.1)

## 2024-03-26 LAB — ETHANOL: Alcohol, Ethyl (B): <15 mg/dL (ref <15)

## 2024-03-26 LAB — DIFFERENTIAL
Abs Immature Granulocytes: 0.04 10*3/uL (ref 0.00–0.07)
Basophils Absolute: 0 10*3/uL (ref 0.0–0.1)
Basophils Relative: 1 %
Eosinophils Absolute: 0 10*3/uL (ref 0.0–0.5)
Eosinophils Relative: 0 %
Immature Granulocytes: 1 %
Lymphocytes Relative: 22 %
Lymphs Abs: 1.7 10*3/uL (ref 0.7–4.0)
Monocytes Absolute: 0.7 10*3/uL (ref 0.1–1.0)
Monocytes Relative: 9 %
Neutro Abs: 5.3 10*3/uL (ref 1.7–7.7)
Neutrophils Relative %: 67 %
Smear Review: NORMAL

## 2024-03-26 LAB — URINALYSIS, ROUTINE W REFLEX MICROSCOPIC
Bilirubin Urine: NEGATIVE
Glucose, UA: >=500 mg/dL — AB
Ketones, ur: 40 mg/dL — AB
Nitrite: NEGATIVE
Protein, ur: NEGATIVE mg/dL
Specific Gravity, Urine: 1.02 (ref 1.005–1.030)
pH: 5 (ref 5.0–8.0)

## 2024-03-26 LAB — CBG MONITORING, ED
Glucose-Capillary: 85 mg/dL (ref 70–99)
Glucose-Capillary: 209 mg/dL — ABNORMAL HIGH (ref 70–99)

## 2024-03-26 LAB — POTASSIUM: Potassium: 4.9 mmol/L (ref 3.5–5.1)

## 2024-03-26 LAB — URINALYSIS, MICROSCOPIC (REFLEX)
RBC / HPF: NONE SEEN RBC/hpf (ref 0–5)
Squamous Epithelial / HPF: NONE SEEN /HPF (ref 0–5)
WBC, UA: >50 WBC/hpf (ref 0–5)

## 2024-03-26 LAB — CBC
HCT: 32.3 % — ABNORMAL LOW (ref 36.0–46.0)
Hemoglobin: 10.1 g/dL — ABNORMAL LOW (ref 12.0–15.0)
MCH: 25 pg — ABNORMAL LOW (ref 26.0–34.0)
MCHC: 31.3 g/dL (ref 30.0–36.0)
MCV: 80 fL (ref 80.0–100.0)
Platelets: 454 10*3/uL — ABNORMAL HIGH (ref 150–400)
RBC: 4.04 MIL/uL (ref 3.87–5.11)
RDW: 15.4 % (ref 11.5–15.5)
WBC: 7.8 10*3/uL (ref 4.0–10.5)
nRBC: 0 % (ref 0.0–0.2)

## 2024-03-26 LAB — URINE DRUG SCREEN
Amphetamines: NOT DETECTED
Barbiturates: NOT DETECTED
Benzodiazepines: NOT DETECTED
Cocaine: NOT DETECTED
Fentanyl: NOT DETECTED
Methadone Scn, Ur: NOT DETECTED
Opiates: NOT DETECTED
Tetrahydrocannabinol: NOT DETECTED

## 2024-03-26 LAB — GLUCOSE, CAPILLARY: Glucose-Capillary: 128 mg/dL — ABNORMAL HIGH (ref 70–99)

## 2024-03-26 MED ORDER — ACETAMINOPHEN 325 MG PO TABS
650.0000 mg | ORAL_TABLET | Freq: Four times a day (QID) | ORAL | Status: DC | PRN
  Administered 2024-03-27: 650 mg via ORAL
  Filled 2024-03-26: qty 2

## 2024-03-26 MED ORDER — POLYETHYLENE GLYCOL 3350 17 G PO PACK
17.0000 g | PACK | Freq: Every day | ORAL | Status: DC | PRN
Start: 2024-03-26 — End: 2024-03-26

## 2024-03-26 MED ORDER — ONDANSETRON HCL 4 MG PO TABS: 4.0000 mg | ORAL_TABLET | Freq: Four times a day (QID) | ORAL | Status: DC | PRN

## 2024-03-26 MED ORDER — DEXTROSE 10 % IV SOLN: Freq: Once | INTRAVENOUS | Status: AC

## 2024-03-26 MED ORDER — SODIUM CHLORIDE 0.9 % IV SOLN
1.0000 g | INTRAVENOUS | Status: AC
  Administered 2024-03-27 – 2024-03-28 (×2): 1 g via INTRAVENOUS
  Filled 2024-03-26 (×2): qty 10

## 2024-03-26 MED ORDER — SODIUM ZIRCONIUM CYCLOSILICATE 10 G PO PACK
10.0000 g | PACK | Freq: Once | ORAL | Status: AC
  Administered 2024-03-26: 10 g via ORAL
  Filled 2024-03-26: qty 1

## 2024-03-26 MED ORDER — ONDANSETRON HCL 4 MG/2ML IJ SOLN: 4.0000 mg | Freq: Four times a day (QID) | INTRAMUSCULAR | Status: DC | PRN

## 2024-03-26 MED ORDER — DEXTROSE 50 % IV SOLN
1.0000 | Freq: Once | INTRAVENOUS | Status: AC
  Administered 2024-03-26: 50 mL via INTRAVENOUS
  Filled 2024-03-26: qty 50

## 2024-03-26 MED ORDER — SODIUM ZIRCONIUM CYCLOSILICATE 10 G PO PACK
10.0000 g | PACK | Freq: Three times a day (TID) | ORAL | Status: AC
  Administered 2024-03-26: 10 g via ORAL
  Filled 2024-03-26: qty 1

## 2024-03-26 MED ORDER — ACETAMINOPHEN 650 MG RE SUPP: 650.0000 mg | Freq: Four times a day (QID) | RECTAL | Status: DC | PRN

## 2024-03-26 MED ORDER — ASPIRIN 81 MG PO TBEC
81.0000 mg | DELAYED_RELEASE_TABLET | Freq: Every day | ORAL | Status: DC
  Administered 2024-03-26 – 2024-03-28 (×3): 81 mg via ORAL
  Filled 2024-03-26 (×3): qty 1

## 2024-03-26 MED ORDER — SODIUM CHLORIDE 0.9 % IV BOLUS
1000.0000 mL | Freq: Once | INTRAVENOUS | Status: AC
  Administered 2024-03-26: 1000 mL via INTRAVENOUS

## 2024-03-26 MED ORDER — SODIUM CHLORIDE 0.9 % IV SOLN
2.0000 g | Freq: Once | INTRAVENOUS | Status: AC
  Administered 2024-03-26: 2 g via INTRAVENOUS
  Filled 2024-03-26: qty 20

## 2024-03-26 MED ORDER — INSULIN ASPART 100 UNIT/ML IV SOLN
5.0000 [IU] | Freq: Once | INTRAVENOUS | Status: AC
  Administered 2024-03-26: 5 [IU] via INTRAVENOUS

## 2024-03-26 MED ORDER — POLYETHYLENE GLYCOL 3350 17 G PO PACK
17.0000 g | PACK | Freq: Every day | ORAL | Status: DC
  Administered 2024-03-27 – 2024-03-28 (×2): 17 g via ORAL
  Filled 2024-03-26 (×2): qty 1

## 2024-03-26 MED ORDER — ENOXAPARIN SODIUM 30 MG/0.3ML IJ SOSY
30.0000 mg | PREFILLED_SYRINGE | INTRAMUSCULAR | Status: DC
  Administered 2024-03-27 – 2024-03-28 (×2): 30 mg via SUBCUTANEOUS
  Filled 2024-03-26 (×2): qty 0.3

## 2024-03-26 MED ORDER — SODIUM CHLORIDE 0.9 % IV BOLUS
500.0000 mL | Freq: Once | INTRAVENOUS | Status: AC
  Administered 2024-03-26: 500 mL via INTRAVENOUS

## 2024-03-26 MED ORDER — INSULIN ASPART 100 UNIT/ML IJ SOLN: 0.0000 [IU] | Freq: Three times a day (TID) | INTRAMUSCULAR | Status: DC

## 2024-03-26 NOTE — Hospital Course (Addendum)
 A1c 11 mo ago 6.7 well controlled for age.  On atorvastatin  40mg    Hydrochlorothiazide  25mg  every day  Losartan  50mg  every day  Metformin  1g BID    K 5.9 (bicarb 19) Scr 2.24 (scr 1 (11/28/2022) Ca 10.8   Hgb 10.1 (11.8 (10/22/22) Plt 454 MCV 80  Check [employer] stores   11/16/2022  SLOutpatient   Patient is a 83 y.o. F who was seen today for dysphagia evaluation. Pt with prior MBSS evidencing pharyngeal dysphagia and pt report of difficulty swallowing all textures and own secretions. Ongoing 1+ month. Has been taking thin liquids PO, using small sips. Has tried cream of chicken soup without difficulties. Able to clear thin liquid (including 2 sequential sips), puree, pudding, and fruit cup boluses this date without overt difficulty. Denies globus sensation or odynophagia with all PO trials, no overt s/sx of aspiration. Appears to be suitable for upgrade to dysphagia 1 diet. At onset, voice changes noted (weaker and pitch changed) with some (variable) improvement endorsed. Slight tremor noted with sustained phonation, prior to PO intake. Pt would benefit from skilled ST to assess ongoing diet tolerance, rehabilitate pt's swallow function, and advance diet (texture and quantity) to facilitate potential removal of PEG tube. Etiology of pt's dysphagia remains unclear, SLP recommends consideration for OP neurology consult and f/u MBSS to assess pt's current swallow function and safety based on improvement evidenced and endorsed.    She was to have f/u MBSS to assess current status of swallow function and safety. At that time recommended upgrade to dysphagia 1 diet

## 2024-03-26 NOTE — ED Provider Notes (Signed)
  EMERGENCY DEPARTMENT AT [facility] Provider Note   CSN: [id number] Arrival date & time: 03/26/24  0941     History {Add pertinent medical, surgical, social history, OB history to HPI:1} No chief complaint on file.   Sara [name] is a 83 y.o. female.  83 yo F with a chief complaints of difficulty with her speech.  This been noticed by family for about a week.  She had something similar and was diagnosed with a mini stroke.  She had significant difficulty swallowing then but is not having any trouble now.  Family deny cough congestion difficulty breathing abdominal pain.  She has been complaining of pain with urination and frequent urination.        Home Medications Prior to Admission medications   Medication Sig Start Date End Date Taking? Authorizing Provider  Accu-Chek Softclix Lancets lancets Please use to check blood sugar up to 4 times daily. 08/21/22   Ernestina Headland, MD  atorvastatin  (LIPITOR) 40 MG tablet Place 1 tablet (40 mg total) into feeding tube daily. 09/11/22 04/15/23  Ganta, Anupa, DO  blood glucose meter kit and supplies KIT Dispense based on patient and insurance preference. Use up to four times daily as directed. 08/24/22   Wilhemena Harbour, MD  cetirizine  (ZYRTEC ) 10 MG tablet Take 1 tablet (10 mg total) by mouth daily as needed for allergies. 08/24/22   Wilhemena Harbour, MD  diclofenac  Sodium (VOLTAREN ) 1 % GEL Apply 2 g topically 4 (four) times daily. 04/06/21   America Just, DO  esomeprazole  (NEXIUM ) 10 MG packet Take 10 mg by mouth daily before breakfast. 09/17/22   Ernestina Headland, MD  Gabapentin  25 MG TABS Give 100 mg by tube at bedtime. 09/10/22   Ganta, Anupa, DO  glucose blood (ACCU-CHEK GUIDE) test strip USE 1  4 TIMES DAILY 10/25/22   Joelle Musca, MD  hydrochlorothiazide  (HYDRODIURIL ) 25 MG tablet Take 1 tablet (25 mg total) by mouth daily. 11/14/22   Joelle Musca, MD  Insulin  Pen Needle (PEN NEEDLES 3/16") 31G X 5 MM MISC  Use as instructed to inject insulin  once daily 11/14/22   Joelle Musca, MD  losartan  (COZAAR ) 50 MG tablet TAKE 1 TABLET BY MOUTH AT BEDTIME 04/01/23   Ernestina Headland, MD  metFORMIN  (GLUCOPHAGE ) 500 MG tablet Place 2 tablets (1,000 mg total) into feeding tube 2 (two) times daily with a meal. 09/10/22 04/15/23  Ganta, Anupa, DO  Nutritional Supplements (FEEDING SUPPLEMENT, KATE FARMS STANDARD 1.4,) LIQD liquid Per nutritionist. Previously on Glucerna 350mL 4x/d (~6 cartons per day), 60mL of free water before and after each bolus feed ( 4x/d) 10/15/22   Ernestina Headland, MD  ondansetron  (ZOFRAN ) 8 MG tablet TAKE 1 TABLET BY MOUTH EVERY 8 HOURS AS NEEDED FOR UP TO 5 DAYS FOR NAUSEA FOR VOMITING 09/24/22   Ernestina Headland, MD  Oral Hygiene Products (Q-CARE Q2 ORAL CLEANS/SUCTION) KIT Use as directed 1 kit in the mouth or throat as needed. 09/14/22   Ernestina Headland, MD      Allergies    Patient has no known allergies.    Review of Systems   Review of Systems  Physical Exam Updated Vital Signs BP 134/81   Pulse (!) 108   Temp 97.7 F (36.5 C) (Oral)   Resp 18   Wt 46.3 kg   SpO2 100%   BMI 19.27 kg/m  Physical Exam Vitals and nursing note reviewed.  Constitutional:      General: She is not in acute  distress.    Appearance: She is well-developed. She is not diaphoretic.  HENT:     Head: Normocephalic and atraumatic.  Eyes:     Pupils: Pupils are equal, round, and reactive to light.  Cardiovascular:     Rate and Rhythm: Normal rate and regular rhythm.     Heart sounds: No murmur heard.    No friction rub. No gallop.  Pulmonary:     Effort: Pulmonary effort is normal.     Breath sounds: No wheezing or rales.  Abdominal:     General: There is no distension.     Palpations: Abdomen is soft.     Tenderness: There is no abdominal tenderness.  Musculoskeletal:        General: No tenderness.     Cervical back: Normal range of motion and neck supple.  Skin:    General: Skin is warm and  dry.  Neurological:     Mental Status: She is alert and oriented to person, place, and time.     GCS: GCS eye subscore is 4. GCS verbal subscore is 5. GCS motor subscore is 6.     Cranial Nerves: Cranial nerves 2-12 are intact.     Sensory: Sensation is intact.     Motor: Motor function is intact.     Coordination: Coordination is intact.     Comments: No obvious focal neurologic deficit.  Psychiatric:        Behavior: Behavior normal.     ED Results / Procedures / Treatments   Labs (all labs ordered are listed, but only abnormal results are displayed) Labs Reviewed  ETHANOL  CBC  DIFFERENTIAL  COMPREHENSIVE METABOLIC PANEL WITH GFR  URINE DRUG SCREEN  URINALYSIS, ROUTINE W REFLEX MICROSCOPIC    EKG None  Radiology No results found.  Procedures Procedures  {Document cardiac monitor, telemetry assessment procedure when appropriate:1}  Medications Ordered in ED Medications - No data to display  ED Course/ Medical Decision Making/ A&P   {   Click here for ABCD2, HEART and other calculatorsREFRESH Note before signing :1}                              Medical Decision Making Amount and/or Complexity of Data Reviewed Labs: ordered. Radiology: ordered.  Risk OTC drugs. Prescription drug management. Decision regarding hospitalization.   83 yo F with a chief complaints of change to her speech and urinary symptoms.  Family is concerned that maybe she has had another mini stroke.  She had similar symptoms when she was diagnosed with this previously.  She is also complaining of urinary symptoms which perhaps caused a recrudescence of her prior stroke.  She has no obvious neurologic deficit.  I do not appreciate any obvious abnormality with speech.  I reviewed the patient's medical record.  She did have an MRI that was negative for acute finding at the time of her prior symptoms and this was in 2023.   Patient's blood work consistent with an acute kidney injury.   Baseline creatinine about 1 creatinine today 2.2.  She has hyperkalemia without obvious EKG changes.  Will temporize.  Continue to give IV fluids.  She has not yet been able to urinate we will BladderScan.  Will discuss with medicine for admission.  The patients results and plan were reviewed and discussed.   Any x-rays performed were independently reviewed by myself.   Differential diagnosis were considered with the presenting HPI.  Medications  sodium zirconium cyclosilicate  (LOKELMA ) packet 10 g (10 g Oral Given 03/26/24 1223)  insulin  aspart (novoLOG ) injection 5 Units (5 Units Intravenous Given 03/26/24 1230)    And  dextrose  50 % solution 50 mL (50 mLs Intravenous Given 03/26/24 1227)  dextrose  10 % infusion ( Intravenous New Bag/Given 03/26/24 1234)  sodium chloride  0.9 % bolus 1,000 mL (1,000 mLs Intravenous New Bag/Given 03/26/24 1310)    Vitals:   03/26/24 0953 03/26/24 0957 03/26/24 1248 03/26/24 1315  BP: 134/81     Pulse: (!) 108  (!) 105 97  Resp: 18  12 17   Temp: 97.7 F (36.5 C)     TempSrc: Oral     SpO2: 100%  99% 93%  Weight:  46.3 kg      Final diagnoses:  None    Admission/ observation were discussed with the admitting physician, patient and/or family and they are comfortable with the plan.   {Document critical care time when appropriate:1} {Document review of labs and clinical decision tools ie heart score, Chads2Vasc2 etc:1}  {Document your independent review of radiology images, and any outside records:1} {Document your discussion with family members, caretakers, and with consultants:1} {Document social determinants of health affecting pt's care:1} {Document your decision making why or why not admission, treatments were needed:1} Final Clinical Impression(s) / ED Diagnoses Final diagnoses:  None    Rx / DC Orders ED Discharge Orders     None

## 2024-03-26 NOTE — Evaluation (Signed)
 SLP Cancellation Note  Patient Details Name: Sara [name] MRN: [id number] DOB: 1941/01/25   Cancelled treatment:       Reason Eval/Treat Not Completed: Other (comment) (order for swallow evaluation received, RN reports emergent MRI ordered therefore will see pt next date.) Pt has h/o dysphagia = requiring PEG.  Prior MBS completed 2023 with recommendation of meds crushed with puree and a dys3/thin diet.    Maudie Sorrow, MS [facility] SLP Acute Rehab Services Office [phone]  Chantal Comment 03/26/2024, 4:47 PM

## 2024-03-26 NOTE — ED Triage Notes (Signed)
 Per daughter pt been having issues with speech "again" x 2 weeks , Hx TIA past October .  Also reports fall today , no injury .   Dysuria reported today .   Alert and oriented x 4 . Wheeled to triage room .

## 2024-03-26 NOTE — H&P (Addendum)
 History and Physical    Patient: Sara [name] ZOX:[id number] DOB: 05-04-1941 DOA: 03/26/2024 DOS: the patient was seen and examined on 03/26/2024 PCP: [facility], [facility]  Patient coming from: [employer] Complaint: Dysarthria, poor appetite   HPI: Sara [name] is a 83 y.o. female with medical history significant of HTN, T2DM (not on insulin ), CVA (with no residual deficits), and HLD who presents with dysarthria, dysphagia, and poor appetite.   History is provided by patient's son, daughter, and patient.   Patient was in her normal state of health approximately 1 week ago. At baseline patient is able to ambulate without an assistive device. She is able to walk throughout her home without dyspnea, cook, feed, and clean herself. However, around 1 week ago she began losing her appetite. Patient states that she had no nausea or emesis, difficulty swallowing or chewing food. However, during this time she began to eat only softer foods.   Sometime between the day of admission and when the patient started losing her appetite she developed intermittent abdominal pain mostly in the lower part of abdomen that she felt was related to constipation as this was relieved when she took laxatives and had a bowel movement. She also started to have some burning with urination.  Patient's family noticed that her gait was unsteady and her speech was more garbled than usual.  Patient eventually had a fall on the day of admission prompting her family to bring her in for further evaluation.  Regarding her fall, she states she went to get mail and slipped and fell.  She did have some dizziness after the fall, but this subsided after about 15 minutes. So you fell this morning, got out of the bed and sitting on the edge of the bed. When got to get mail patient slippped and fell. Not lightheaded or dizzy before fall but after fall she felt dizzy and lightheaded. Dizziness went away after 15 min.    She denies cough, fever, dyspnea.  She does note loose stools but this was after taking a laxative.   No cough or fever. No sob at rest or doe.   Review of Systems: As mentioned in the history of present illness. All other systems reviewed and are negative. Past Medical History:  Diagnosis Date   Allergy    Dizziness 02/21/2018   Estrogen deficiency 01/13/2018   Need for prophylactic vaccination against Streptococcus pneumoniae (pneumococcus) 01/13/2018   Right knee pain 05/29/2019   Routine general medical examination at a health care facility 01/13/2018   Seasonal allergies 06/21/2020   Vaginal itching 06/21/2020   Vision changes 01/13/2018   Past Surgical History:  Procedure Laterality Date   DG THUMB RIGHT HAND ([facility] HX) Right    EYE SURGERY     IR GASTROSTOMY TUBE MOD SED  09/05/2022   IR GASTROSTOMY TUBE REMOVAL  12/04/2022   TUBAL LIGATION     Social History:  reports that she has never smoked. She has never used smokeless tobacco. She reports that she does not drink alcohol  and does not use drugs.  No Known Allergies  Family History  Problem Relation Age of Onset   Heart disease Mother    Diabetes Mother    Other Mother        Roxy Cordial   Alzheimer's disease Father    Cancer Sister    Cancer Brother    Alzheimer's disease Brother    Breast cancer Neg Hx  Prior to Admission medications   Medication Sig Start Date End Date Taking? Authorizing Provider  Accu-Chek Softclix Lancets lancets Please use to check blood sugar up to 4 times daily. 08/21/22   Ernestina Headland, MD  atorvastatin  (LIPITOR) 40 MG tablet Place 1 tablet (40 mg total) into feeding tube daily. 09/11/22 04/15/23  Ganta, Anupa, DO  blood glucose meter kit and supplies KIT Dispense based on patient and insurance preference. Use up to four times daily as directed. 08/24/22   Wilhemena Harbour, MD  cetirizine  (ZYRTEC ) 10 MG tablet Take 1 tablet (10 mg total) by mouth daily as needed for allergies. 08/24/22    Wilhemena Harbour, MD  diclofenac  Sodium (VOLTAREN ) 1 % GEL Apply 2 g topically 4 (four) times daily. 04/06/21   America Just, DO  esomeprazole  (NEXIUM ) 10 MG packet Take 10 mg by mouth daily before breakfast. 09/17/22   Ernestina Headland, MD  Gabapentin  25 MG TABS Give 100 mg by tube at bedtime. 09/10/22   Ganta, Anupa, DO  glucose blood (ACCU-CHEK GUIDE) test strip USE 1  4 TIMES DAILY 10/25/22   Joelle Musca, MD  hydrochlorothiazide  (HYDRODIURIL ) 25 MG tablet Take 1 tablet (25 mg total) by mouth daily. 11/14/22   Joelle Musca, MD  Insulin  Pen Needle (PEN NEEDLES 3/16") 31G X 5 MM MISC Use as instructed to inject insulin  once daily 11/14/22   Joelle Musca, MD  losartan  (COZAAR ) 50 MG tablet TAKE 1 TABLET BY MOUTH AT BEDTIME 04/01/23   Ernestina Headland, MD  metFORMIN  (GLUCOPHAGE ) 500 MG tablet Place 2 tablets (1,000 mg total) into feeding tube 2 (two) times daily with a meal. 09/10/22 04/15/23  Ganta, Anupa, DO  Nutritional Supplements (FEEDING SUPPLEMENT, KATE FARMS STANDARD 1.4,) LIQD liquid Per nutritionist. Previously on Glucerna 350mL 4x/d (~6 cartons per day), 60mL of free water before and after each bolus feed ( 4x/d) 10/15/22   Ernestina Headland, MD  ondansetron  (ZOFRAN ) 8 MG tablet TAKE 1 TABLET BY MOUTH EVERY 8 HOURS AS NEEDED FOR UP TO 5 DAYS FOR NAUSEA FOR VOMITING 09/24/22   Ernestina Headland, MD  Oral Hygiene Products (Q-CARE Q2 ORAL CLEANS/SUCTION) KIT Use as directed 1 kit in the mouth or throat as needed. 09/14/22   Ernestina Headland, MD    Physical Exam: Vitals:   03/26/24 0957 03/26/24 1248 03/26/24 1315 03/26/24 1512  BP:    132/85  Pulse:  (!) 105 97 95  Resp:  12 17 18   Temp:    (!) 97.4 F (36.3 C)  TempSrc:    Oral  SpO2:  99% 93% 100%  Weight: 46.3 kg      Physical Exam  Constitutional: In no distress.  Cardiovascular: Normal rate, regular rhythm. No lower extremity edema  Pulmonary: Non labored breathing on room air, no wheezing or rales.  No lower extremity  edema Abdominal: Soft. Normal bowel sounds. Non distended, mildly tender in suprapubic region  Musculoskeletal: Normal range of motion.     Neurological: Alert and oriented to person, place, and time. Mild dysmetria on the R with FTN (reportedly poor vision in R eye however).  Skin: Skin is warm and dry.    Full code.  Data Reviewed:     Latest Ref Rng & Units 03/26/2024   10:47 AM 10/22/2022   10:28 PM 09/06/2022    5:26 AM  CBC  WBC 4.0 - 10.5 K/uL 7.8  7.7  8.8   Hemoglobin 12.0 - 15.0 g/dL 16.1  09.6  04.5   Hematocrit 36.0 -  46.0 % 32.3  37.9  35.9   Platelets 150 - 400 K/uL 454  257  134       Latest Ref Rng & Units 03/26/2024   10:47 AM 11/28/2022   11:30 AM 11/14/2022    9:38 AM  BMP  Glucose 70 - 99 mg/dL 98  161  90   BUN 8 - 23 mg/dL 32  12  11   Creatinine 0.44 - 1.00 mg/dL 0.96  0.45  4.09   BUN/Creat Ratio 12 - 28  12  13    Sodium 135 - 145 mmol/L 133  142  142   Potassium 3.5 - 5.1 mmol/L 5.9  4.0  4.1   Chloride 98 - 111 mmol/L 103  103  105   CO2 22 - 32 mmol/L 19  21  25    Calcium  8.9 - 10.3 mg/dL 81.1  9.9  91.4    MR BRAIN WO CONTRAST Result Date: 03/26/2024 CLINICAL DATA:  Acute neurologic deficit EXAM: MRI HEAD WITHOUT CONTRAST TECHNIQUE: Multiplanar, multiecho pulse sequences of the brain and surrounding structures were obtained without intravenous contrast. COMPARISON:  None Available. FINDINGS: Brain: No acute infarct, mass effect or extra-axial collection. No acute or chronic hemorrhage. There is multifocal hyperintense T2-weighted signal within the white matter. Parenchymal volume and CSF spaces are normal. The midline structures are normal. Vascular: Normal flow voids. Skull and upper cervical spine: Normal calvarium and skull base. Visualized upper cervical spine and soft tissues are normal. Sinuses/Orbits:No paranasal sinus fluid levels or advanced mucosal thickening. No mastoid or middle ear effusion. Normal orbits. IMPRESSION: 1. No acute intracranial  abnormality. 2. Chronic microangiopathic white matter changes. Electronically Signed   By: Juanetta Nordmann M.D.   On: 03/26/2024 20:30   DG Chest Port 1 View Result Date: 03/26/2024 CLINICAL DATA:  Fall.  Dysuria.  Question aspiration. EXAM: PORTABLE CHEST 1 VIEW COMPARISON:  09/03/2022 FINDINGS: The lungs are clear without focal pneumonia, edema, pneumothorax or pleural effusion. The cardiopericardial silhouette is within normal limits for size. No acute bony abnormality. Telemetry leads overlie the chest. IMPRESSION: No active disease. Electronically Signed   By: Donnal Fusi M.D.   On: 03/26/2024 12:07   CT HEAD WO CONTRAST Result Date: 03/26/2024 CLINICAL DATA:  Neuro deficit, concern for stroke, fall today, slurred speech for 2 weeks. EXAM: CT HEAD WITHOUT CONTRAST TECHNIQUE: Contiguous axial images were obtained from the base of the skull through the vertex without intravenous contrast. RADIATION DOSE REDUCTION: This exam was performed according to the departmental dose-optimization program which includes automated exposure control, adjustment of the mA and/or kV according to patient size and/or use of iterative reconstruction technique. COMPARISON:  CTA head and neck 09/13/2022. FINDINGS: Brain: No acute intracranial hemorrhage. No CT evidence of acute infarct. Remote infarct in the posterior aspect of the left lentiform nuclei. Nonspecific hypoattenuation in the periventricular and subcortical white matter favored to reflect chronic microvascular ischemic changes. No edema, mass effect, or midline shift. The basilar cisterns are patent. Ventricles: The ventricles are normal. Vascular: Atherosclerotic calcifications of the carotid siphons. No hyperdense vessel. Skull: No acute or aggressive finding. Orbits: Orbits are symmetric. Sinuses: The visualized paranasal sinuses are clear. Other: Mastoid air cells are clear. IMPRESSION: No CT evidence of acute intracranial abnormality. Remote infarct involving  the left lentiform nuclei. Mild chronic microvascular ischemic changes. Electronically Signed   By: Denny Flack M.D.   On: 03/26/2024 12:07     Assessment and Plan:  #Stroke like symptoms  Patient  presented with dysarthria and ataxia for ~1 week in duration.   Unclear exact etiology of patient's symptoms.  Suspect that her poor oral intake over at least the last week as well as her continued use of hydrochlorothiazide  and losartan  led to her acute kidney injury.  Her renal insufficiency along with gabapentin  use likely led to her presenting symptoms.  Possible UTI contributing as well. Also considered CVA given patient's risk factors.  Plan: Will obtain MRI brain Discontinue gabapentin  Continue to encourage oral intake   #UTI  Scented with dysuria (burning with urination and suprapubic pain).  Continue ceftriaxone  while inpatient.  #NAGMA  #AKI on CKD 3a  #Hyperkalemia  #Hypercalcemia      Latest Ref Rng & Units 03/26/2024    3:29 PM 03/26/2024   10:47 AM 11/28/2022   11:30 AM  BMP  Glucose 70 - 99 mg/dL 045  98  409   BUN 8 - 23 mg/dL 29  32  12   Creatinine 0.44 - 1.00 mg/dL 8.11  9.14  7.82   BUN/Creat Ratio 12 - 28   12   Sodium 135 - 145 mmol/L 134  133  142   Potassium 3.5 - 5.1 mmol/L 5.4  5.9  4.0   Chloride 98 - 111 mmol/L 108  103  103   CO2 22 - 32 mmol/L 19  19  21    Calcium  8.9 - 10.3 mg/dL 95.6  21.3  9.9   Patient reportedly with CKD 3A and baseline serum creatinine of around 1.  Serum creatinine elevated up to 2.24 on admission.  She was also noted to have a mild non anion gap metabolic acidosis and hyperkalemia up to 5.9.   Her electrolyte derangements and AKI are likely related to her poor oral intake and continued antihypertensive use.  She received IV fluids, dextrose /insulin  as well as Lokelma in the ED.  Plan: Bolus another half liter of IV fluids Given additional dose of Lokelma, repeat potassium 5.4 Hold losartan  and HCTZ  #Normocytic anemia  #  Thrombocytosis M CV 80, hemoglobin 10.8, thrombocytosis.  Patient denies any overt bleeding.  No colorectal cancer screening noted in chart. -Check iron panel    T2DM Last A1c 6.7, well controlled.  She is on metformin  1 g twice daily. Will hold this while she is inpatient [facility]   HTN Home losartan  and hydrochlorothiazide .  R hypoglossal nerve palsy  Dysphagia  Patient has a history of dysphagia and required a PEG tube due to poor oral intake.  She saw neurology 03/2023 and at that time she was tolerating a regular diet with all textures. However, patient notes she is mostly eating soft foods.  -Will resume diet with dysphagia 3.  -SLP will evaluate patient in the AM, may require MBSS, last SLP note in the outpatient recommended periodic assessment of her swallowing.       Advance Care Planning:   Code Status: Prior Full code.  Discussed with patient and 2 of her children at bedside.  Consults: None  Family Communication: Daughter and son at bedside  Severity of Illness: The appropriate patient status for this patient is OBSERVATION. Observation status is judged to be reasonable and necessary in order to provide the required intensity of service to ensure the patient's safety. The patient's presenting symptoms, physical exam findings, and initial radiographic and laboratory data in the context of their medical condition is felt to place them at decreased risk for further clinical deterioration. Furthermore, it is anticipated that the  patient will be medically stable for discharge from the hospital within 2 midnights of admission.   Author: Joette Mustard, MD 03/26/2024 3:26 PM  For on call review [email or web address].

## 2024-03-27 ENCOUNTER — Encounter (INDEPENDENT_AMBULATORY_CARE_PROVIDER_SITE_OTHER): Admitting: Ophthalmology

## 2024-03-27 DIAGNOSIS — R471 Dysarthria and anarthria: Secondary | ICD-10-CM

## 2024-03-27 LAB — GLUCOSE, CAPILLARY
Glucose-Capillary: 119 mg/dL — ABNORMAL HIGH (ref 70–99)
Glucose-Capillary: 118 mg/dL — ABNORMAL HIGH (ref 70–99)
Glucose-Capillary: 180 mg/dL — ABNORMAL HIGH (ref 70–99)
Glucose-Capillary: 139 mg/dL — ABNORMAL HIGH (ref 70–99)

## 2024-03-27 LAB — BASIC METABOLIC PANEL WITH GFR
Anion gap: 7 (ref 5–15)
BUN: 25 mg/dL — ABNORMAL HIGH (ref 8–23)
CO2: 19 mmol/L — ABNORMAL LOW (ref 22–32)
Calcium: 9.7 mg/dL (ref 8.9–10.3)
Chloride: 107 mmol/L (ref 98–111)
Creatinine, Ser: 1.67 mg/dL — ABNORMAL HIGH (ref 0.44–1.00)
GFR, Estimated: 30 mL/min — ABNORMAL LOW (ref >60)
Glucose, Bld: 132 mg/dL — ABNORMAL HIGH (ref 70–99)
Potassium: 4.8 mmol/L (ref 3.5–5.1)
Sodium: 133 mmol/L — ABNORMAL LOW (ref 135–145)

## 2024-03-27 LAB — CBC
HCT: 30.2 % — ABNORMAL LOW (ref 36.0–46.0)
Hemoglobin: 9.6 g/dL — ABNORMAL LOW (ref 12.0–15.0)
MCH: 25.9 pg — ABNORMAL LOW (ref 26.0–34.0)
MCHC: 31.8 g/dL (ref 30.0–36.0)
MCV: 81.4 fL (ref 80.0–100.0)
Platelets: 399 10*3/uL (ref 150–400)
RBC: 3.71 MIL/uL — ABNORMAL LOW (ref 3.87–5.11)
RDW: 15.2 % (ref 11.5–15.5)
WBC: 6.8 10*3/uL (ref 4.0–10.5)
nRBC: 0 % (ref 0.0–0.2)

## 2024-03-27 LAB — FERRITIN: Ferritin: 230 ng/mL (ref 11–307)

## 2024-03-27 LAB — LIPID PANEL
Cholesterol: 69 mg/dL (ref 0–200)
HDL: 41 mg/dL (ref >40)
LDL Cholesterol: 17 mg/dL (ref 0–99)
Total CHOL/HDL Ratio: 1.7 ratio
Triglycerides: 54 mg/dL (ref <150)
VLDL: 11 mg/dL (ref 0–40)

## 2024-03-27 LAB — IRON AND TIBC
Iron: 31 ug/dL (ref 28–170)
Saturation Ratios: 20 % (ref 10.4–31.8)
TIBC: 155 ug/dL — ABNORMAL LOW (ref 250–450)
UIBC: 124 ug/dL

## 2024-03-27 LAB — ALBUMIN: Albumin: 1.9 g/dL — ABNORMAL LOW (ref 3.5–5.0)

## 2024-03-27 LAB — VITAMIN B12: Vitamin B-12: 1660 pg/mL — ABNORMAL HIGH (ref 180–914)

## 2024-03-27 LAB — FOLATE: Folate: 5.3 ng/mL — ABNORMAL LOW (ref >5.9)

## 2024-03-27 MED ORDER — ADULT MULTIVITAMIN W/MINERALS CH
1.0000 | ORAL_TABLET | Freq: Every day | ORAL | Status: DC
  Administered 2024-03-27 – 2024-03-28 (×2): 1 via ORAL
  Filled 2024-03-27 (×2): qty 1

## 2024-03-27 MED ORDER — FOLIC ACID 1 MG PO TABS
1.0000 mg | ORAL_TABLET | Freq: Every day | ORAL | Status: DC
  Administered 2024-03-27 – 2024-03-28 (×2): 1 mg via ORAL
  Filled 2024-03-27 (×2): qty 1

## 2024-03-27 MED ORDER — ENSURE PLUS HIGH PROTEIN PO LIQD
237.0000 mL | Freq: Three times a day (TID) | ORAL | Status: DC
  Administered 2024-03-27 – 2024-03-28 (×3): 237 mL via ORAL

## 2024-03-27 MED ORDER — SODIUM CHLORIDE 0.9 % IV SOLN
INTRAVENOUS | Status: AC
  Administered 2024-03-27: 75 mL/h via INTRAVENOUS

## 2024-03-27 NOTE — Progress Notes (Signed)
 Initial Nutrition Assessment  DOCUMENTATION CODES:   Severe malnutrition in context of chronic illness  INTERVENTION:   -Ensure Plus High Protein po TID, each supplement provides 350 kcal and 20 grams of protein.   -Multivitamin with minerals daily  -Placed "High Calorie, High Protein" handout in AVS  NUTRITION DIAGNOSIS:   Severe Malnutrition related to chronic illness as evidenced by severe fat depletion, severe muscle depletion.  GOAL:   Patient will meet greater than or equal to 90% of their needs  MONITOR:   PO intake, Supplement acceptance  REASON FOR ASSESSMENT:   Consult Assessment of nutrition requirement/status  ASSESSMENT:   83 y.o. female with medical history significant of HTN, T2DM (not on insulin ), CVA (with no residual deficits), and HLD who presents with dysarthria, dysphagia, and poor appetite.  Patient in room, daughters at bedside. They report pt has been eating better since her PEG was removed in February 2025. Was eating 3 meals a day with 3 Boosts daily. Really likes protein shakes. Pt drinking an Ensure in room during visit. Reports she was not eating as well this past week, per daughter appetite changed after Mother's day (2.5 weeks ago). Pt denies swallowing issues, SLP evaluated and recommend regular diet. Main issue is that she has broken dentures.  Per weight records, pt has lost 16 lbs since 04/15/23 (13% wt loss x 11.5 months, insignificant for time frame).  Medications: Folic acid, Multivitamin with minerals daily, Miralax  Labs reviewed: CBGs: 118-128 Low Na   NUTRITION - FOCUSED PHYSICAL EXAM:  Flowsheet Row Most Recent Value  Orbital Region Moderate depletion  Upper Arm Region Severe depletion  Thoracic and Lumbar Region Severe depletion  Buccal Region Moderate depletion  Temple Region Severe depletion  Clavicle Bone Region Severe depletion  Clavicle and Acromion Bone Region Severe depletion  Scapular Bone Region Severe  depletion  Dorsal Hand Severe depletion  Patellar Region Severe depletion  Anterior Thigh Region Severe depletion  Posterior Calf Region Severe depletion  Edema (RD Assessment) None  Hair Reviewed  Eyes Reviewed  Mouth Reviewed  Skin Reviewed  Nails Reviewed       Diet Order:   Diet Order             Diet regular Room service appropriate? Yes; Fluid consistency: Thin  Diet effective now                   EDUCATION NEEDS:   Education needs have been addressed  Skin:  Skin Assessment: Reviewed RN Assessment  Last BM:  5/29  Height:   Ht Readings from Last 1 Encounters:  03/26/24 5\' 1"  (1.549 m)    Weight:   Wt Readings from Last 1 Encounters:  03/26/24 46.3 kg    BMI:  Body mass index is 19.27 kg/m.  Estimated Nutritional Needs:   Kcal:  1400-1600  Protein:  70-85g  Fluid:  1.6L/day   Arna Better, MS, RD, LDN Inpatient Clinical Dietitian Contact via Secure chat

## 2024-03-27 NOTE — Progress Notes (Signed)
   03/27/24 0339  Assess: MEWS Score  Temp 98.3 F (36.8 C)  BP (!) 140/65  MAP (mmHg) 83  Pulse Rate (!) 121  ECG Heart Rate (!) 123  Resp (!) 21  Level of Consciousness Alert  SpO2 100 %  O2 Device Room Air  Assess: MEWS Score  MEWS Temp 0  MEWS Systolic 0  MEWS Pulse 2  MEWS RR 1  MEWS LOC 0  MEWS Score 3  MEWS Score Color Yellow  Assess: if the MEWS score is Yellow or Red  Were vital signs accurate and taken at a resting state? Yes  MEWS guidelines implemented  Yes, yellow  Treat  MEWS Interventions Considered administering scheduled or prn medications/treatments as ordered  Take Vital Signs  Increase Vital Sign Frequency  Yellow: Q2hr x1, continue Q4hrs until patient remains green for 12hrs  Escalate  MEWS: Escalate Yellow: Discuss with charge nurse and consider notifying provider and/or RRT  Notify: Charge Nurse/RN  Name of Charge Nurse/RN Notified Kingsley Penny, RN  Provider Notification  Provider Name/Title A. Lorre Rosin, NP  Date Provider Notified 03/27/24  Time Provider Notified [phone]  Method of Notification Page  Notification Reason Other (Comment) (New Yellow Mews d/t HR)  Provider response No new orders  Date of Provider Response 03/27/24  Time of Provider Response 0355  Assess: SIRS CRITERIA  SIRS Temperature  0  SIRS Respirations  1  SIRS Pulse 1  SIRS WBC 0  SIRS Score Sum  2

## 2024-03-27 NOTE — Discharge Instructions (Signed)

## 2024-03-27 NOTE — Evaluation (Signed)
 Occupational Therapy Evaluation Patient Details Name: Sara [name] MRN: [id number] DOB: 10-Jul-1941 Today's Date: 03/27/2024   History of Present Illness   Pt is a 83 y.o. female who presents with dysarthria, dysphagia, and poor appetite. Past medical history significant of HTN, T2DM (not on insulin ), CVA (with no residual deficits), and HLD     Clinical Impressions PTA, patient lives with family in a 1 level home and was mod I with assist only for transportation and groceries. Currently, patient presents with deficits outlined below (see OT Problem List for details) most significantly generalized muscle weakness, modified diet per SLP, decreased balance and activity tolerance impacting BADL's and functional mobility. OT recommending home with family assist/support and HHOT with a 3 in 1 commode.  Patient requires continued Acute care hospital level OT services to progress safety and functional performance and allow for discharge.       If plan is discharge home, recommend the following:   A little help with bathing/dressing/bathroom;A little help with walking and/or transfers;Assistance with cooking/housework;Assist for transportation;Help with stairs or ramp for entrance     Functional Status Assessment   Patient has had a recent decline in their functional status and demonstrates the ability to make significant improvements in function in a reasonable and predictable amount of time.     Equipment Recommendations   BSC/3in1      Precautions/Restrictions   Precautions Precautions: Fall Restrictions Weight Bearing Restrictions Per Provider Order: No     Mobility Bed Mobility Overal bed mobility: Needs Assistance Bed Mobility: Supine to Sit, Sit to Supine     Supine to sit: Contact guard, HOB elevated, Used rails Sit to supine: Contact guard assist, HOB elevated   General bed mobility comments: increased time and effort    Transfers Overall transfer level:  Needs assistance Equipment used: Rolling walker (2 wheels) Transfers: Sit to/from Stand Sit to Stand: Contact guard assist           General transfer comment: verbal cues for hand placement      Balance Overall balance assessment: History of Falls                                         ADL either performed or assessed with clinical judgement   ADL Overall ADL's : Needs assistance/impaired Eating/Feeding: Modified independent Eating/Feeding Details (indicate cue type and reason): self feedign mod I, modified diet (see SLP recs)  SLP recs Grooming: Wash/dry hands;Wash/dry face;Oral care;Standing;Supervision/safety   Upper Body Bathing: Supervision/ safety;Sitting   Lower Body Bathing: Contact guard assist;Sit to/from stand   Upper Body Dressing : Set up;Sitting   Lower Body Dressing: Contact guard assist;Sit to/from stand   Toilet Transfer: Contact guard assist;Rolling walker (2 wheels);Regular Toilet;Grab bars   Toileting- Clothing Manipulation and Hygiene: Contact guard assist;Sit to/from stand       Functional mobility during ADLs: Contact guard assist;Rolling walker (2 wheels) General ADL Comments: increased time due to mild weakness     Vision Baseline Vision/History: 0 No visual deficits Ability to See in Adequate Light: 0 Adequate Vision Assessment?: No apparent visual deficits            Pertinent Vitals/Pain Pain Assessment Pain Assessment: No/denies pain     Extremity/Trunk Assessment Upper Extremity Assessment Upper Extremity Assessment: Right hand dominant;Overall [facility] for tasks assessed   Lower Extremity Assessment Lower Extremity Assessment: Defer to PT evaluation  Cervical / Trunk Assessment Cervical / Trunk Assessment: Normal   Communication Communication Communication: No apparent difficulties   Cognition Arousal: Alert Behavior During Therapy: WFL for tasks assessed/performed Cognition: No apparent impairments                                Following commands: Intact       Cueing  General Comments   Cueing Techniques: Verbal cues  low BMI           Home Living Family/patient expects to be discharged to:: Private residence Living Arrangements: Children;Other relatives Available Help at Discharge: Family;Available 24 hours/day Type of Home: House Home Access: Stairs to enter [employer] of Steps: 1   Home Layout: One level     Bathroom Shower/Tub: Walk-in Pensions consultant: Standard Bathroom Accessibility: Yes How Accessible: Accessible via walker Home Equipment: Rollator (4 wheels)          Prior Functioning/Environment Prior Level of Function : Independent/Modified Independent             Mobility Comments: ambulatory without AD ADLs Comments: independent with light cooking and laundry, family assist with transportation    OT Problem List: Decreased strength;Decreased activity tolerance;Impaired balance (sitting and/or standing);Decreased safety awareness   OT Treatment/Interventions: Self-care/ADL training;Therapeutic exercise;Energy conservation;DME and/or AE instruction;Therapeutic activities;Patient/family education;Balance training      OT Goals(Current goals can be found in the care plan section)   Acute Rehab OT Goals Patient Stated Goal: to get stronger OT Goal Formulation: With patient/family Time For Goal Achievement: 04/10/24 Potential to Achieve Goals: Good ADL Goals Pt Will Perform Lower Body Bathing: with modified independence;sit to/from stand Pt Will Perform Lower Body Dressing: with supervision;sit to/from stand Pt Will Transfer to Toilet: with supervision;bedside commode Pt Will Perform Toileting - Clothing Manipulation and hygiene: with set-up;sit to/from stand Pt Will Perform Tub/Shower Transfer: with supervision;shower seat Pt/caregiver will Perform Home Exercise Program: Increased strength;Both right  and left upper extremity;Independently;With written HEP provided   OT Frequency:  Min 2X/week       AM-PAC OT "6 Clicks" Daily Activity     Outcome Measure Help from another person eating meals?: None Help from another person taking care of personal grooming?: None Help from another person toileting, which includes using toliet, bedpan, or urinal?: A Little Help from another person bathing (including washing, rinsing, drying)?: A Little Help from another person to put on and taking off regular upper body clothing?: A Little Help from another person to put on and taking off regular lower body clothing?: A Little 6 Click Score: 20   End of Session Equipment Utilized During Treatment: Gait belt;Rolling walker (2 wheels) Nurse Communication: Mobility status  Activity Tolerance: Patient tolerated treatment well Patient left: in bed;with call bell/phone within reach;with bed alarm set;with family/visitor present  OT Visit Diagnosis: Unsteadiness on feet (R26.81);Muscle weakness (generalized) (M62.81);History of falling (Z91.81);Feeding difficulties (R63.3);Other (comment) (not self feeding difficulty, swallowing deficits)                Time: 1420-1450 OT Time Calculation (min): 30 min Charges:  OT General Charges $OT Visit: 1 Visit OT Evaluation $OT Eval Low Complexity: 1 Low OT Treatments $Self Care/Home Management : 8-22 mins  Harless Molinari OT/L Acute Rehabilitation Department  [phone]  03/27/2024, 4:57 PM

## 2024-03-27 NOTE — Progress Notes (Signed)
   03/27/24 1433  Spiritual Encounters  Type of Visit Attempt (pt unavailable)   I attempted to visit with Mrs. Madura to discuss HCPOA. At time of visit she was receiving PT care intervention.  Sharmane Dame L. Minetta Aly, M.Div [phone]

## 2024-03-27 NOTE — Progress Notes (Signed)
 PROGRESS NOTE    Sara [name]  ZOX:[id number] DOB: 04/26/1941 DOA: 03/26/2024 PCP: [facility]  No chief complaint on file.   Brief Narrative:   Sara [name] is Sara [name] 83 y.o. female with medical history significant of HTN, T2DM (not on insulin ), CVA (with no residual deficits), and HLD who presents with dysarthria, dysphagia, and poor appetite as well as [name] gait abnormality.  MRI without acute findings.  Workup underway.    Assessment & Plan:   Principal Problem:   Dysarthria Active Problems:   AKI (acute kidney injury) (HCC)  Dysarthria  Gait Abnormality Sx since around Mother's day MRI without acute intracranial abnormality - unclear cause - related to electrolyte derangements or UTI below? Exam notable for lower extremity weakness and tongue deviation to R Given bilateral LE weakness > upper will get MRI T/L spine Holding gabapentin  Folate low - replace B12 elevated.  TSH wnl. PT/OT/SLP Would benefit from neurology outpatient follow up  UTI  Dysuria Ceftriaxone  Follow culture, unfortunately not yet collected  Acute Kidney Injury Hyperkalemia Baseline creatinine appears to be around 1 Presented with creatinine of 2.24, improving today Will trend - hold losartan  and HCTZ  Poor PO intake RD c/s  Hypercalcemia Correct for albumin, work up additionally as needed  Anemia  Folate Deficiency Supplement folate  T2DM Holding metformin , SSI for now  Hx R hyoglossal nerve palsy Dysphagia Regular, thin liquids per SLP     DVT prophylaxis: lovenox Code Status: full Family Communication: daughter,son Disposition:   Status is: Inpatient Remains inpatient appropriate because: need for ongoing care   Consultants:  none  Procedures:  none  Antimicrobials:  Anti-infectives (From admission, onward)    Start     Dose/Rate Route Frequency Ordered Stop   03/27/24 1600  cefTRIAXone  (ROCEPHIN ) 1 g in sodium chloride  0.9 % 100 mL IVPB        1  g 200 mL/hr over 30 Minutes Intravenous Every 24 hours 03/26/24 1648 03/29/24 1559   03/26/24 1445  cefTRIAXone  (ROCEPHIN ) 2 g in sodium chloride  0.9 % 100 mL IVPB        2 g 200 mL/hr over 30 Minutes Intravenous  Once 03/26/24 1436 03/26/24 1815       Subjective: Dysarthria and gait abnormality since around mothers day She fell yesterday am dysuria  Objective: Vitals:   03/27/24 0339 03/27/24 0558 03/27/24 0914 03/27/24 1524  BP: (!) 140/65 (!) 141/82 135/71 133/71  Pulse: (!) 121 (!) 116 (!) 111 (!) 104  Resp: (!) 21 19 18 18   Temp: 98.3 F (36.8 C) 98.5 F (36.9 C) 98 F (36.7 C) 98 F (36.7 C)  TempSrc: Oral Oral Oral Oral  SpO2: 100% 100% 100% 100%  Weight:      Height:        Intake/Output Summary (Last 24 hours) at 03/27/2024 1644 Last data filed at 03/27/2024 1300 Gross per 24 hour  Intake 590 ml  Output 400 ml  Net 190 ml   Filed Weights   03/26/24 0957  Weight: 46.3 kg    Examination:  General exam: Appears calm and comfortable  Respiratory system: unlabored Cardiovascular system: RRR Gastrointestinal system: Abdomen is nondistended, soft and nontender.  Central nervous system: Alert and oriented. CN notable for deviation of tongue to R.  5/5 strength to upper extremities.  4/5 to lowers.  FNF and heel to shin intact.   Extremities: no LEE   Data Reviewed: I have personally reviewed following labs and imaging studies  CBC: Recent Labs  Lab 03/26/24 1047 03/27/24 0354  WBC 7.8 6.8  NEUTROABS 5.3  --   HGB 10.1* 9.6*  HCT 32.3* 30.2*  MCV 80.0 81.4  PLT 454* 399    Basic Metabolic Panel: Recent Labs  Lab 03/26/24 1047 03/26/24 1529 03/26/24 2102 03/27/24 0354  NA 133* 134*  --  133*  K 5.9* 5.4* 4.9 4.8  CL 103 108  --  107  CO2 19* 19*  --  19*  GLUCOSE 98 111*  --  132*  BUN 32* 29*  --  25*  CREATININE 2.24* 1.94*  --  1.67*  CALCIUM  10.8* 10.1  --  9.7    GFR: Estimated Creatinine Clearance: 18.7 mL/min ([place]) (by C-G  formula based on SCr of 1.67 mg/dL (H)).  Liver Function Tests: Recent Labs  Lab 03/26/24 1047  AST 16  ALT 10  ALKPHOS 101  BILITOT 0.4  PROT 8.1  ALBUMIN 3.0*    CBG: Recent Labs  Lab 03/26/24 1307 03/26/24 2030 03/27/24 0741 03/27/24 1219 03/27/24 1621  GLUCAP 209* 128* 119* 118* 180*     No results found for this or any previous visit (from the past 240 hours).       Radiology Studies: DG Abd 1 View Result Date: 03/26/2024 CLINICAL DATA:  Constipation. EXAM: ABDOMEN - 1 VIEW COMPARISON:  None Available. FINDINGS: Small volume of formed stool in the colon. No abnormal rectal distention. High-density material within bowel in the upper abdomen is likely related to ingested material. No small bowel distension or evidence of obstruction. Vascular calcifications are seen. IMPRESSION: Small volume of formed stool in the colon. No bowel obstruction. Electronically Signed   By: Chadwick Colonel M.D.   On: 03/26/2024 22:38   MR BRAIN WO CONTRAST Result Date: 03/26/2024 CLINICAL DATA:  Acute neurologic deficit EXAM: MRI HEAD WITHOUT CONTRAST TECHNIQUE: Multiplanar, multiecho pulse sequences of the brain and surrounding structures were obtained without intravenous contrast. COMPARISON:  None Available. FINDINGS: Brain: No acute infarct, mass effect or extra-axial collection. No acute or chronic hemorrhage. There is multifocal hyperintense T2-weighted signal within the white matter. Parenchymal volume and CSF spaces are normal. The midline structures are normal. Vascular: Normal flow voids. Skull and upper cervical spine: Normal calvarium and skull base. Visualized upper cervical spine and soft tissues are normal. Sinuses/Orbits:No paranasal sinus fluid levels or advanced mucosal thickening. No mastoid or middle ear effusion. Normal orbits. IMPRESSION: 1. No acute intracranial abnormality. 2. Chronic microangiopathic white matter changes. Electronically Signed   By: Juanetta Nordmann M.D.    On: 03/26/2024 20:30   DG Chest Port 1 View Result Date: 03/26/2024 CLINICAL DATA:  Fall.  Dysuria.  Question aspiration. EXAM: PORTABLE CHEST 1 VIEW COMPARISON:  09/03/2022 FINDINGS: The lungs are clear without focal pneumonia, edema, pneumothorax or pleural effusion. The cardiopericardial silhouette is within normal limits for size. No acute bony abnormality. Telemetry leads overlie the chest. IMPRESSION: No active disease. Electronically Signed   By: Donnal Fusi M.D.   On: 03/26/2024 12:07   CT HEAD WO CONTRAST Result Date: 03/26/2024 CLINICAL DATA:  Neuro deficit, concern for stroke, fall today, slurred speech for 2 weeks. EXAM: CT HEAD WITHOUT CONTRAST TECHNIQUE: Contiguous axial images were obtained from the base of the skull through the vertex without intravenous contrast. RADIATION DOSE REDUCTION: This exam was performed according to the departmental dose-optimization program which includes automated exposure control, adjustment of the mA and/or kV according to patient size and/or use of iterative reconstruction  technique. COMPARISON:  CTA head and neck 09/13/2022. FINDINGS: Brain: No acute intracranial hemorrhage. No CT evidence of acute infarct. Remote infarct in the posterior aspect of the left lentiform nuclei. Nonspecific hypoattenuation in the periventricular and subcortical white matter favored to reflect chronic microvascular ischemic changes. No edema, mass effect, or midline shift. The basilar cisterns are patent. Ventricles: The ventricles are normal. Vascular: Atherosclerotic calcifications of the carotid siphons. No hyperdense vessel. Skull: No acute or aggressive finding. Orbits: Orbits are symmetric. Sinuses: The visualized paranasal sinuses are clear. Other: Mastoid air cells are clear. IMPRESSION: No CT evidence of acute intracranial abnormality. Remote infarct involving the left lentiform nuclei. Mild chronic microvascular ischemic changes. Electronically Signed   By: Denny Flack  M.D.   On: 03/26/2024 12:07        Scheduled Meds:  aspirin EC  81 mg Oral Daily   enoxaparin (LOVENOX) injection  30 mg Subcutaneous Q24H   feeding supplement  237 mL Oral TID BM   folic acid  1 mg Oral Daily   insulin  aspart  0-9 Units Subcutaneous TID WC   multivitamin with minerals  1 tablet Oral Daily   polyethylene glycol  17 g Oral Daily   Continuous Infusions:  cefTRIAXone  (ROCEPHIN )  IV 1 g (03/27/24 1522)     LOS: 1 day    Time spent: over 30 min    Donnetta Gains, MD [facility]   To contact the attending provider between 7A-7P or the covering provider during after hours 7P-7A, please log into the web site [email or web address] and access using universal [place] password for that web site. If you do not have the password, please call the hospital operator.  03/27/2024, 4:44 PM

## 2024-03-27 NOTE — Evaluation (Signed)
 Physical Therapy Evaluation Patient Details Name: Sara [name] MRN: [id number] DOB: Jun 09, 1941 Today's Date: 03/27/2024  History of Present Illness  Pt is a 83 y.o. female who presents with dysarthria, dysphagia, and poor appetite. Past medical history significant of HTN, T2DM (not on insulin ), CVA (with no residual deficits), and HLD  Clinical Impression  Pt admitted with above diagnosis.  Pt currently with functional limitations due to the deficits listed below (see PT Problem List). Pt will benefit from acute skilled PT to increase their independence and safety with mobility to allow discharge.   Pt pleasant and motivated to participate. Pt ambulated 80 ft in hallway and appears steady with use of RW.  Pt typically independent at home however agreeable to use RW upon d/c for safety.  Pt also agreeable with HHPT.  Pt lives with her daughter and reports assist available upon d/c if needed.          If plan is discharge home, recommend the following: Help with stairs or ramp for entrance;Assist for transportation;Assistance with cooking/housework   Can travel by private vehicle        Equipment Recommendations None recommended by PT  Recommendations for Other Services       Functional Status Assessment Patient has had a recent decline in their functional status and demonstrates the ability to make significant improvements in function in a reasonable and predictable amount of time.     Precautions / Restrictions Precautions Precautions: Fall      Mobility  Bed Mobility Overal bed mobility: Needs Assistance Bed Mobility: Supine to Sit, Sit to Supine     Supine to sit: Contact guard, HOB elevated, Used rails Sit to supine: Contact guard assist, HOB elevated   General bed mobility comments: increased time and effort    Transfers Overall transfer level: Needs assistance Equipment used: Rolling walker (2 wheels) Transfers: Sit to/from Stand Sit to Stand: Contact guard  assist           General transfer comment: verbal cues for hand placement    Ambulation/Gait Ambulation/Gait assistance: Contact guard assist Gait Distance (Feet): 80 Feet Assistive device: Rolling walker (2 wheels) Gait Pattern/deviations: Step-through pattern, Decreased stride length, Trunk flexed Gait velocity: decr     General Gait Details: verbal cues for safe use of RW, pt denies dizziness, utilizes UEs through RW for improved support, steady with RW, distance to tolerance  Stairs            Wheelchair Mobility     Tilt Bed    Modified Rankin (Stroke Patients Only)       Balance Overall balance assessment: History of Falls                                           Pertinent Vitals/Pain Pain Assessment Pain Assessment: No/denies pain    Home Living Family/patient expects to be discharged to:: Private residence Living Arrangements: Children;Other relatives Available Help at Discharge: Family;Available 24 hours/day Type of Home: House Home Access: Stairs to enter   [employer] of Steps: 1   Home Layout: One level Home Equipment: Rollator (4 wheels)      Prior Function Prior Level of Function : Independent/Modified Independent             Mobility Comments: ambulatory without AD ADLs Comments: independent with light cooking and laundry, family assist with transportation  Extremity/Trunk Assessment        Lower Extremity Assessment Lower Extremity Assessment: Generalized weakness (denies numbness but endorses tingling in lower legs)       Communication   Communication Communication: No apparent difficulties    Cognition Arousal: Alert Behavior During Therapy: WFL for tasks assessed/performed   PT - Cognitive impairments: No apparent impairments                         Following commands: Intact       Cueing       General Comments      Exercises     Assessment/Plan    PT  Assessment Patient needs continued PT services  PT Problem List Decreased strength;Decreased activity tolerance;Decreased balance;Decreased mobility;Decreased knowledge of use of DME       PT Treatment Interventions DME instruction;Gait training;Balance training;Stair training;Functional mobility training;Therapeutic activities;Therapeutic exercise;Patient/family education    PT Goals (Current goals can be found in the Care Plan section)  Acute Rehab PT Goals PT Goal Formulation: With patient/family Time For Goal Achievement: 04/10/24 Potential to Achieve Goals: Good    Frequency Min 2X/week     Co-evaluation               AM-PAC PT "6 Clicks" Mobility  Outcome Measure Help needed turning from your back to your side while in a flat bed without using bedrails?: A Little Help needed moving from lying on your back to sitting on the side of a flat bed without using bedrails?: A Little Help needed moving to and from a bed to a chair (including a wheelchair)?: A Little Help needed standing up from a chair using your arms (e.g., wheelchair or bedside chair)?: A Little Help needed to walk in hospital room?: A Little Help needed climbing 3-5 steps with a railing? : A Little 6 Click Score: 18    End of Session Equipment Utilized During Treatment: Gait belt Activity Tolerance: Patient tolerated treatment well Patient left: in bed;with call bell/phone within reach;with family/visitor present Nurse Communication: Mobility status PT Visit Diagnosis: Difficulty in walking, not elsewhere classified (R26.2)    Time: 1610-9604 PT Time Calculation (min) (ACUTE ONLY): 17 min   Charges:   PT Evaluation $PT Eval Low Complexity: 1 Low   PT General Charges $$ ACUTE PT VISIT: 1 Visit        Blanch Bunde, DPT Physical Therapist Acute Rehabilitation Services Office: [phone]   Sara [name] [place] 03/27/2024, 3:43 PM

## 2024-03-27 NOTE — Evaluation (Signed)
 Clinical/Bedside Swallow Evaluation Patient Details  Name: YSENIA FILICE MRN: [id number] Date of Birth: June 01, 1941  Today's Date: 03/27/2024 Time: SLP Start Time (ACUTE ONLY): 0805 SLP Stop Time (ACUTE ONLY): 0839 SLP Time Calculation (min) (ACUTE ONLY): 34 min  Past Medical History:  Past Medical History:  Diagnosis Date   Allergy    Dizziness 02/21/2018   Estrogen deficiency 01/13/2018   Need for prophylactic vaccination against Streptococcus pneumoniae (pneumococcus) 01/13/2018   Right knee pain 05/29/2019   Routine general medical examination at a health care facility 01/13/2018   Seasonal allergies 06/21/2020   Vaginal itching 06/21/2020   Vision changes 01/13/2018   Past Surgical History:  Past Surgical History:  Procedure Laterality Date   DG THUMB RIGHT HAND ([facility] HX) Right    EYE SURGERY     IR GASTROSTOMY TUBE MOD SED  09/05/2022   IR GASTROSTOMY TUBE REMOVAL  12/04/2022   TUBAL LIGATION     HPI:  Patient is an 83 year old female admitted to [facility] with dysuria, dysarthria and ataxia.  CT and MRI were negative for strokes concern present for potential UTI and patient is having acute kidney injury with hyperkalemia.  She does have history of sudden onset of dysphagia in 2022 resulting in poor intake and nutrition.  She received a feeding tube for nutrition which was subsequently removed removed February 2025.    Assessment / Plan / Recommendation  Clinical Impression  Patient presents with clinically functional swallow ability.  Hypoglossal palsy noted which was seen by an outpatient neurologist during prior visit and patient appears with slight right facial asymmetry.  Observed patient consuming graham crackers, solids, apple juice and water.  Patient is slow to masticate but able to fully clear.  No signs and symptoms of aspiration.  She reports her swallowing issues have resolved and she does not sense retention in her oral pharynx (retention noted on most recent  MBS) nor does she cough or have discomfort when eating and drinking.    Patient's lower dentures are broken and she typically eats with them therefore requested patient and her daughter order all of her meals to which they advised they would. Daughter reports she will work on getting pt lower dentures.  Some weight loss reported prior to feeding tube being removed per daughter, [facility] but also continued following PEG removal.     Although swallow function reported to be normal, patient continues to lose weight therefore recommend dietitian consult.  She consumes boost at home and she may benefit from nutritional supplement during this hospital course.  Patient denies having issues with swallowing pills with liquids therefore recommend medication administration as she tolerates.    Of note patient was last seen by speech pathology in January 2025 and reports she continues her swallowing exercises inconsistently.  Advised that she continue lingual press exercise for lower hyolaryngeal elevation to aid pharyngeal clearance through PES.  Using return demonstration patient able to demonstrate this exercise adequately.   SLP Visit Diagnosis: Dysphagia, unspecified (R13.10)    Aspiration Risk  Mild aspiration risk    Diet Recommendation Regular;Thin liquid    Medication Administration: Whole meds with liquid Supervision: Patient able to self feed Compensations: Slow rate;Small sips/bites (Start intake with liquid, drink liquids throughout meal) Postural Changes: Seated upright at 90 degrees;Remain upright for at least 30 minutes after po intake    Other  Recommendations Recommended Consults: Other (Comment) (Dietitian consult) Oral Care Recommendations: Oral care BID Caregiver Recommendations: Other (Comment)  Recommendations for follow up therapy are one component of a multi-disciplinary discharge planning process, led by the attending physician.  Recommendations may be updated based on patient  status, additional functional criteria and insurance authorization.  Follow up Recommendations No SLP follow up      Assistance Recommended at Discharge  N/a  Functional Status Assessment Patient has not had a recent decline in their functional status  Frequency and Duration     N/a       Prognosis    N/a    Swallow Study   General Date of Onset: 03/27/24 HPI: Patient is an 83 year old female admitted to [facility] with dysuria, dysarthria and ataxia.  CT and MRI were negative for strokes concern present for potential UTI and patient is having acute kidney injury with hyperkalemia.  She does have history of sudden onset of dysphagia in 2022 resulting in poor intake and nutrition.  She received a feeding tube for nutrition which was subsequently removed removed February 2025. Type of Study: Bedside Swallow Evaluation Diet Prior to this Study: Dysphagia 3 (mechanical soft);Thin liquids (Level 0) Temperature Spikes Noted: No Respiratory Status: Room air History of Recent Intubation: No Behavior/Cognition: Alert;Cooperative Oral Cavity Assessment: Within Functional Limits Oral Care Completed by SLP: No Oral Cavity - Dentition: Dentures, top (low dentures broken) Vision: Functional for self-feeding Self-Feeding Abilities: Able to feed self Patient Positioning: Upright in bed Baseline Vocal Quality: Normal Volitional Cough: Strong Volitional Swallow: Able to elicit    Oral/Motor/Sensory Function Overall Oral Motor/Sensory Function: Mild impairment Facial ROM: Within Functional Limits Facial Symmetry: Abnormal symmetry right (slight) Facial Strength: Within Functional Limits Facial Sensation: Within Functional Limits Lingual ROM: Reduced right Lingual Symmetry: Within Functional Limits Lingual Strength: Reduced;Suspected CN XII (hypoglossal) dysfunction (slightly reduced right) Lingual Sensation: Within Functional Limits Velum: Within Functional Limits Mandible:  Within Functional Limits   Ice Chips Ice chips: Not tested   Thin Liquid Thin Liquid: Within functional limits Presentation: Self Fed;Straw    Nectar Thick Nectar Thick Liquid: Not tested   Honey Thick Honey Thick Liquid: Not tested   Puree Puree: Within functional limits Presentation: Self Fed;Spoon   Solid     Solid: Within functional limits Presentation: Self Fed      Chantal Comment 03/27/2024,9:31 AM

## 2024-03-28 ENCOUNTER — Inpatient Hospital Stay (HOSPITAL_COMMUNITY)

## 2024-03-28 ENCOUNTER — Other Ambulatory Visit (HOSPITAL_COMMUNITY): Payer: Self-pay

## 2024-03-28 DIAGNOSIS — R471 Dysarthria and anarthria: Secondary | ICD-10-CM | POA: Diagnosis not present

## 2024-03-28 LAB — COMPREHENSIVE METABOLIC PANEL WITH GFR
ALT: 12 U/L (ref 0–44)
AST: 14 U/L — ABNORMAL LOW (ref 15–41)
Albumin: 2.1 g/dL — ABNORMAL LOW (ref 3.5–5.0)
Alkaline Phosphatase: 70 U/L (ref 38–126)
Anion gap: 6 (ref 5–15)
BUN: 18 mg/dL (ref 8–23)
CO2: 22 mmol/L (ref 22–32)
Calcium: 9.7 mg/dL (ref 8.9–10.3)
Chloride: 105 mmol/L (ref 98–111)
Creatinine, Ser: 1.52 mg/dL — ABNORMAL HIGH (ref 0.44–1.00)
GFR, Estimated: 34 mL/min — ABNORMAL LOW (ref >60)
Glucose, Bld: 97 mg/dL (ref 70–99)
Potassium: 4.5 mmol/L (ref 3.5–5.1)
Sodium: 133 mmol/L — ABNORMAL LOW (ref 135–145)
Total Bilirubin: 0.6 mg/dL (ref 0.0–1.2)
Total Protein: 7.1 g/dL (ref 6.5–8.1)

## 2024-03-28 LAB — GLUCOSE, CAPILLARY
Glucose-Capillary: 110 mg/dL — ABNORMAL HIGH (ref 70–99)
Glucose-Capillary: 167 mg/dL — ABNORMAL HIGH (ref 70–99)
Glucose-Capillary: 113 mg/dL — ABNORMAL HIGH (ref 70–99)

## 2024-03-28 LAB — CBC WITH DIFFERENTIAL/PLATELET
Abs Immature Granulocytes: 0 10*3/uL (ref 0.00–0.07)
Basophils Absolute: 0 10*3/uL (ref 0.0–0.1)
Basophils Relative: 0 %
Eosinophils Absolute: 0 10*3/uL (ref 0.0–0.5)
Eosinophils Relative: 0 %
HCT: 30 % — ABNORMAL LOW (ref 36.0–46.0)
Hemoglobin: 9.4 g/dL — ABNORMAL LOW (ref 12.0–15.0)
Lymphocytes Relative: 35 %
Lymphs Abs: 2.5 10*3/uL (ref 0.7–4.0)
MCH: 25.9 pg — ABNORMAL LOW (ref 26.0–34.0)
MCHC: 31.3 g/dL (ref 30.0–36.0)
MCV: 82.6 fL (ref 80.0–100.0)
Monocytes Absolute: 0.6 10*3/uL (ref 0.1–1.0)
Monocytes Relative: 8 %
Neutro Abs: 4 10*3/uL (ref 1.7–7.7)
Neutrophils Relative %: 57 %
Platelets: 411 10*3/uL — ABNORMAL HIGH (ref 150–400)
RBC: 3.63 MIL/uL — ABNORMAL LOW (ref 3.87–5.11)
RDW: 15.2 % (ref 11.5–15.5)
WBC: 7 10*3/uL (ref 4.0–10.5)
nRBC: 0 % (ref 0.0–0.2)

## 2024-03-28 LAB — TSH: TSH: 1.73 u[IU]/mL (ref 0.350–4.500)

## 2024-03-28 LAB — MAGNESIUM: Magnesium: 1.7 mg/dL (ref 1.7–2.4)

## 2024-03-28 LAB — PHOSPHORUS: Phosphorus: 1.5 mg/dL — ABNORMAL LOW (ref 2.5–4.6)

## 2024-03-28 MED ORDER — FOLIC ACID 1 MG PO TABS
1.0000 mg | ORAL_TABLET | Freq: Every day | ORAL | 0 refills | Status: DC
  Filled 2024-03-28: qty 90, 90d supply, fill #0

## 2024-03-28 MED ORDER — CEFADROXIL 500 MG PO CAPS
500.0000 mg | ORAL_CAPSULE | Freq: Every day | ORAL | 0 refills | Status: AC
  Filled 2024-03-28: qty 4, 4d supply, fill #0

## 2024-03-28 NOTE — Progress Notes (Signed)
 Mobility Specialist - Progress Note  Pre-mobility: 109 bpm HR, During mobility: 125 bpm HR, Post-mobility: 94 bpm HR,     03/28/24 1321  Mobility  Activity Ambulated with assistance in hallway;Ambulated with assistance to bathroom  Level of Assistance Contact guard assist, steadying assist  Assistive Device Front wheel walker  Distance Ambulated (ft) 100 ft  Range of Motion/Exercises Active  Activity Response Tolerated well  Mobility Referral Yes  Mobility visit 1 Mobility  Mobility Specialist Start Time (ACUTE ONLY) 1305  Mobility Specialist Stop Time (ACUTE ONLY) 1321  Mobility Specialist Time Calculation (min) (ACUTE ONLY) 16 min   Pt was found on recliner chair and agreeable to ambulate. No complaints with session and returned to bed with all needs met. Call bell in reach.  Lorna Rose,  Mobility Specialist Can be reached via Secure Chat

## 2024-03-28 NOTE — Discharge Summary (Signed)
 Physician Discharge Summary  [name]:[id number] DOB: 1941/02/23 DOA: 03/26/2024  PCP: [facility] [address] [facility]  Admit date: 03/26/2024 Discharge date: 03/28/2024  Time spent: 40 minutes  Recommendations for Outpatient Follow-up:  Follow outpatient CBC/CMP  Follow with neurology and neurosurgery for gait abnormality outpatient Follow pending thiamine  Follow folate deficiency Follow UTI symptoms outpatient, if recurrent, follow repeat UA/culture  Follow renal function to resolution of AKI (watch BP as holding thiazide and arb)  Discharge Diagnoses:  Principal Problem:   Dysarthria Active Problems:   AKI (acute kidney injury) (HCC)   Discharge Condition: stable  Diet recommendation: heart healthy, diabetic  Filed Weights   03/26/24 0957  Weight: 46.3 kg    History of present illness:  Sara [name] is Sara [name] 83 y.o. female with medical history significant of HTN, T2DM (not on insulin ), CVA (with no residual deficits), and HLD who presents with dysarthria, dysphagia, and poor appetite as well as [name] gait abnormality.   MRI without acute findings.  Her dentures are broken, this could explain poor PO intake and speech change.  LE weakness worked up further with MRI T/L spine as noted below.  Plan is for outpatient neurology and neurosurgery follow up.  She's also being treated for Adream Parzych UTI.   Stable for discharge on 5/31, see below for additional details.     Hospital Course:  Assessment and Plan:  Dysarthria  Gait Abnormality Sx since around Mother's day MRI without acute intracranial abnormality - unclear cause - related to electrolyte derangements or UTI below? Exam notable for lower extremity weakness and tongue deviation to R On further discussion, family thinks speech abnormality due to missing/broken lower denture MRI T/L spine with advanced lumbar spine degeneration at L2-L3 and L4-L5, no significant lumbar spinal stenosis, but severe neural foraminal and/or lateral  recess stenosis at L L2, L L3, R L4, and R L5 nerve levels.  Normal for age appearance of thoracic spine.   Folate low - replace B12 elevated.  TSH wnl. B1 pending at the time of discharge PT/OT/SLP -> home health Would benefit from neurology outpatient follow up.  Will also have her follow with nsgy.   UTI  Dysuria Ceftriaxone  Follow culture, unfortunately not yet collected.  Will complete 3 days ceftriaxone  and finish course with duricef.     Acute Kidney Injury Hyperkalemia Baseline creatinine appears to be around 1 Presented with creatinine of 2.24, improving today, not yet to baseline hold losartan  and HCTZ   Poor PO intake RD c/s   Hypercalcemia Resolved today   Anemia  Folate Deficiency Supplement folate   T2DM Hold metformin  at discharge until renal function improves   Hx R hyoglossal nerve palsy Dysphagia Regular, thin liquids per SLP        Procedures: none   Consultations: none  Discharge Exam: Vitals:   03/28/24 0558 03/28/24 1212  BP: (!) 151/80 108/67  Pulse: (!) 107 (!) 102  Resp: 16 17  Temp: 98.4 F (36.9 C) 98 F (36.7 C)  SpO2: 100% 100%   No new complaints  General: No acute distress. Cardiovascular: RRR Lungs: unlabored Neurological: Alert and oriented 3. Moves all extremities 4 (mildly weaker to LE's). Deviation of tongue to R. Extremities: No clubbing or cyanosis. No edema.  Discharge Instructions   Discharge Instructions     Call MD for:  difficulty breathing, headache or visual disturbances   Complete by: As directed    Call MD for:  extreme fatigue   Complete by: As directed  Call MD for:  hives   Complete by: As directed    Call MD for:  persistant dizziness or light-headedness   Complete by: As directed    Call MD for:  persistant nausea and vomiting   Complete by: As directed    Call MD for:  redness, tenderness, or signs of infection (pain, swelling, redness, odor or green/yellow discharge around  incision site)   Complete by: As directed    Call MD for:  severe uncontrolled pain   Complete by: As directed    Call MD for:  temperature >100.4   Complete by: As directed    Diet - low sodium heart healthy   Complete by: As directed    Discharge instructions   Complete by: As directed    You were seen for neurologic symptoms (speech changes and gait abnormality).    We've done an extensive workup to this point, revealing [name] suspected UTI.  We treated you with Abbegale Stehle few days of IV antibiotics and will send you home with Jaquila Santelli few days of oral antibiotics.  We didn't get Frady Taddeo urine culture while you were here, if you have recurrent or ongoing symptoms, you should have Loudon Krakow repeat urinalysis and culture done outpatient.    Your MRI of the brain was negative for any acute findings (it showed some chronic white matter changes).  The MRI of your spine showed neural foraminal and lateral recess stenosis.  You should follow up with neurosurgery about your abnormal gait (abnormal walking) and these imaging findings outpatient.  We stopped your blood pressure medicines.  Your kidney function is better, but not to normal.  Follow repeat labs with your PCP outpatient to determine if you need additional workup and whether your meds should be restarted or not.   You should call Dr. [name] of neurology for follow up as well.  Your speech changes maybe related to your denture issue, follow this once this is corrected.    We ordered Alicen Donalson thiamine  level which is pending at the time of discharge.  Return for new, recurrent, or worsening symptoms.  Please ask your PCP to request records from this hospitalization so they know what was done and what the next steps will be.   Increase activity slowly   Complete by: As directed       Allergies as of 03/28/2024   No Known Allergies      Medication List     PAUSE taking these medications    metFORMIN  500 MG tablet Wait to take this until your doctor or other care  provider tells you to start again. Follow up with your PCP for repeat labs before resuming your metformin  Commonly known as: GLUCOPHAGE  Place 2 tablets (1,000 mg total) into feeding tube 2 (two) times daily with Emin Foree meal.       STOP taking these medications    Aleve 220 MG tablet Generic drug: naproxen sodium   hydrochlorothiazide  25 MG tablet Commonly known as: HYDRODIURIL    losartan  100 MG tablet Commonly known as: COZAAR    losartan  50 MG tablet Commonly known as: COZAAR        TAKE these medications    Accu-Chek Guide test strip Generic drug: glucose blood USE 1  4 TIMES DAILY   Accu-Chek Softclix Lancets lancets Please use to check blood sugar up to 4 times daily.   Artificial Tears 1 % ophthalmic solution Generic drug: carboxymethylcellulose Place 1 drop into both eyes 3 (three) times daily as needed (for dryness).  atorvastatin  40 MG tablet Commonly known as: LIPITOR Place 1 tablet (40 mg total) into feeding tube daily. What changed: how to take this   blood glucose meter kit and supplies Kit Dispense based on patient and insurance preference. Use up to four times daily as directed.   cefadroxil 500 MG capsule Commonly known as: DURICEF Take 1 capsule (500 mg total) by mouth daily for 4 days.   cetirizine  10 MG tablet Commonly known as: ZYRTEC  Take 1 tablet (10 mg total) by mouth daily as needed for allergies.   diclofenac  Sodium 1 % Gel Commonly known as: Voltaren  Apply 2 g topically 4 (four) times daily. What changed:  when to take this reasons to take this   esomeprazole  10 MG packet Commonly known as: NEXIUM  Take 10 mg by mouth daily before breakfast.   feeding supplement (KATE FARMS STANDARD 1.4) Liqd liquid Per nutritionist. Previously on Glucerna 4x/d (~6 cartons per day), 60mL of free water before and after each bolus feed ( 4x/d)   gabapentin  100 MG capsule Commonly known as: NEURONTIN  Take 100 mg by mouth at bedtime. What  changed: Another medication with the same name was removed. Continue taking this medication, and follow the directions you see here.   ketorolac 0.4 % Soln Commonly known as: ACULAR Place 1 drop into the left eye 4 (four) times daily.   mirtazapine 7.5 MG tablet Commonly known as: REMERON Take 7.5 mg by mouth at bedtime.   moxifloxacin 0.5 % ophthalmic solution Commonly known as: VIGAMOX Place 1 drop into the left eye 3 (three) times daily.   ondansetron  8 MG tablet Commonly known as: ZOFRAN  TAKE 1 TABLET BY MOUTH EVERY 8 HOURS AS NEEDED FOR UP TO 5 DAYS FOR NAUSEA FOR VOMITING What changed: See the new instructions.   Pen Needles 3/16" 31G X 5 MM Misc Use as instructed to inject insulin  once daily   prednisoLONE acetate 1 % ophthalmic suspension Commonly known as: PRED FORTE Place 1 drop into the left eye 3 (three) times daily.   Q-Care Q2 Oral Cleans/Suction Kit Use as directed 1 kit in the mouth or throat as needed.       No Known Allergies  Follow-up Information     [facility] Follow up.   Specialty: Neurosurgery Why: Call on Monday for Seylah Wernert follow up appointment for your gait abnormality (issues walking).  They might consider additional imaging while you're there. Contact information: [address] [place] [address] [phone]         [facility] [address] Follow up.   Contact information: [address] [place] [address] [phone]         Patel, Donika K, DO Follow up.   Specialty: Neurology Why: call for Julyanna Scholle follow up appointment with Dr. [name] for your issues walking and with speech. Contact information: [address] [place] [address] [phone]                  The results of significant diagnostics from this hospitalization (including imaging, microbiology, ancillary and laboratory) are listed below for reference.    Significant Diagnostic Studies: MR  LUMBAR SPINE WO CONTRAST Result Date: 03/28/2024 CLINICAL DATA:  83 year old female with bilateral lower extremity weakness. EXAM: MRI LUMBAR SPINE WITHOUT CONTRAST TECHNIQUE: Multiplanar, multisequence MR imaging of the lumbar spine was performed. No intravenous contrast was administered. COMPARISON:  Thoracic MRI today reported separately. CT abdomen 09/03/2022. FINDINGS: Segmentation:  Normal, concordant with the thoracic numbering today. Alignment: Chronic straightening of lumbar lordosis superimposed on mild to moderate dextroconvex lumbar scoliosis. Subtle chronic retrolisthesis of L2 on L3, anterolisthesis of L4 on L5. Vertebrae: Maintained vertebral body height. Chronic degenerative endplate marrow sparing and signal changes at L2-L3 and L4-L5. Vacuum disc at those levels on the prior CT. Intact visible sacrum and SI joints. No marrow edema or evidence of acute osseous abnormality. Conus medullaris and cauda equina: Conus extends to the T12-L1 level. No lower spinal cord or conus signal abnormality. Generally normal cauda equina nerve roots. Paraspinal and other soft tissues: Trabeculated urinary bladder wall thickening (series 9, image 40), mildly to moderately distended bladder. Negative visible abdominal viscera. Some lower lumbar erector spinae muscle, paraspinal muscle atrophy. Disc levels: L1-L2:  Negative. L2-L3: Chronic severe disc space loss, mild spondylolisthesis. Leftward circumferential disc osteophyte complex. Moderate left lateral recess stenosis (left L3 nerve level). Severe left L2 foraminal stenosis (series 6, image 11). No spinal stenosis. L3-L4: Mild circumferential disc bulge. Mild facet and ligament flavum hypertrophy. No significant stenosis. L4-L5: Severe disc space loss. Mild anterolisthesis. Circumferential disc osteophyte complex asymmetric to the right. Moderate to severe ligament flavum, moderate right greater than left facet hypertrophy. Severe right lateral recess stenosis  (right L5 nerve level series 9, image 26). Severe right L4 neural foraminal stenosis (series 6, image 6). No significant spinal stenosis. L5-S1:  Negative. IMPRESSION: 1. Chronically advanced lumbar spine degeneration at L2-L3 and L4-L5 in the setting of mild spondylolisthesis, dextroconvex lumbar scoliosis. No significant lumbar spinal stenosis but severe neural foraminal and/or lateral recess stenosis at the left L2, left L3, right L4 and right L5 nerve levels. 2. Very mild for age lumbar spine degeneration elsewhere. 3. Trabeculated urinary bladder wall thickening, query chronic Urinary retention or bladder outlet obstruction. Electronically Signed   By: Marlise Simpers M.D.   On: 03/28/2024 13:18   MR THORACIC SPINE WO CONTRAST Result Date: 03/28/2024 CLINICAL DATA:  83 year old female with bilateral lower extremity weakness. EXAM: MRI THORACIC SPINE WITHOUT CONTRAST TECHNIQUE: Multiplanar, multisequence MR imaging of the thoracic spine was performed. No intravenous contrast was administered. COMPARISON:  Chest CT 09/24/2022. Brain MRI 03/26/2024. FINDINGS: Limited cervical spine imaging: Partially visible C6-C7 disc degeneration, C7-T1 facet hypertrophy. Thoracic spine segmentation:  Normal on the comparison CT. Alignment: Stable thoracic kyphosis since 2023. No significant scoliosis or spondylolisthesis. Vertebrae: Normal background bone marrow signal. Maintained thoracic vertebral height. No marrow edema or evidence of acute osseous abnormality. Cord: Normal. Normal conus medullaris at T12-L1. Fairly capacious underlying thoracic spinal canal. Paraspinal and other soft tissues: Trace layering pleural effusions (series 33 image 27). Otherwise negative visible chest and upper abdominal viscera. Negative visualized posterior paraspinal soft tissues. Disc levels: Very mild for age thoracic spine degeneration. No disc herniation. No spinal stenosis. No significant thoracic foraminal stenosis. IMPRESSION: 1. Normal for  age MRI appearance of the thoracic spine. Normal thoracic spinal cord. 2. Trace layering pleural effusions. Electronically Signed   By: Marlise Simpers M.D.   On: 03/28/2024 13:13   DG Abd 1 View Result Date: 03/26/2024 CLINICAL DATA:  Constipation. EXAM: ABDOMEN - 1 VIEW COMPARISON:  None Available. FINDINGS: Small volume of formed stool in the colon. No abnormal rectal distention. High-density material within bowel in the upper abdomen is likely related to ingested material. No small bowel distension or evidence of obstruction. Vascular calcifications are seen. IMPRESSION: Small volume of formed stool in the colon. No bowel obstruction. Electronically Signed   By:  Chadwick Colonel M.D.   On: 03/26/2024 22:38   MR BRAIN WO CONTRAST Result Date: 03/26/2024 CLINICAL DATA:  Acute neurologic deficit EXAM: MRI HEAD WITHOUT CONTRAST TECHNIQUE: Multiplanar, multiecho pulse sequences of the brain and surrounding structures were obtained without intravenous contrast. COMPARISON:  None Available. FINDINGS: Brain: No acute infarct, mass effect or extra-axial collection. No acute or chronic hemorrhage. There is multifocal hyperintense T2-weighted signal within the white matter. Parenchymal volume and CSF spaces are normal. The midline structures are normal. Vascular: Normal flow voids. Skull and upper cervical spine: Normal calvarium and skull base. Visualized upper cervical spine and soft tissues are normal. Sinuses/Orbits:No paranasal sinus fluid levels or advanced mucosal thickening. No mastoid or middle ear effusion. Normal orbits. IMPRESSION: 1. No acute intracranial abnormality. 2. Chronic microangiopathic white matter changes. Electronically Signed   By: Juanetta Nordmann M.D.   On: 03/26/2024 20:30   DG Chest Port 1 View Result Date: 03/26/2024 CLINICAL DATA:  Fall.  Dysuria.  Question aspiration. EXAM: PORTABLE CHEST 1 VIEW COMPARISON:  09/03/2022 FINDINGS: The lungs are clear without focal pneumonia, edema, pneumothorax  or pleural effusion. The cardiopericardial silhouette is within normal limits for size. No acute bony abnormality. Telemetry leads overlie the chest. IMPRESSION: No active disease. Electronically Signed   By: Donnal Fusi M.D.   On: 03/26/2024 12:07   CT HEAD WO CONTRAST Result Date: 03/26/2024 CLINICAL DATA:  Neuro deficit, concern for stroke, fall today, slurred speech for 2 weeks. EXAM: CT HEAD WITHOUT CONTRAST TECHNIQUE: Contiguous axial images were obtained from the base of the skull through the vertex without intravenous contrast. RADIATION DOSE REDUCTION: This exam was performed according to the departmental dose-optimization program which includes automated exposure control, adjustment of the mA and/or kV according to patient size and/or use of iterative reconstruction technique. COMPARISON:  CTA head and neck 09/13/2022. FINDINGS: Brain: No acute intracranial hemorrhage. No CT evidence of acute infarct. Remote infarct in the posterior aspect of the left lentiform nuclei. Nonspecific hypoattenuation in the periventricular and subcortical white matter favored to reflect chronic microvascular ischemic changes. No edema, mass effect, or midline shift. The basilar cisterns are patent. Ventricles: The ventricles are normal. Vascular: Atherosclerotic calcifications of the carotid siphons. No hyperdense vessel. Skull: No acute or aggressive finding. Orbits: Orbits are symmetric. Sinuses: The visualized paranasal sinuses are clear. Other: Mastoid air cells are clear. IMPRESSION: No CT evidence of acute intracranial abnormality. Remote infarct involving the left lentiform nuclei. Mild chronic microvascular ischemic changes. Electronically Signed   By: Denny Flack M.D.   On: 03/26/2024 12:07    Microbiology: No results found for this or any previous visit (from the past 240 hours).   Labs: Basic Metabolic Panel: Recent Labs  Lab 03/26/24 1047 03/26/24 1529 03/26/24 2102 03/27/24 0354 03/28/24 0447   NA 133* 134*  --  133* 133*  K 5.9* 5.4* 4.9 4.8 4.5  CL 103 108  --  107 105  CO2 19* 19*  --  19* 22  GLUCOSE 98 111*  --  132* 97  BUN 32* 29*  --  25* 18  CREATININE 2.24* 1.94*  --  1.67* 1.52*  CALCIUM  10.8* 10.1  --  9.7 9.7  MG  --   --   --   --  1.7  PHOS  --   --   --   --  1.5*   Liver Function Tests: Recent Labs  Lab 03/26/24 1047 03/27/24 1725 03/28/24 0447  AST 16  --  14*  ALT 10  --  12  ALKPHOS 101  --  70  BILITOT 0.4  --  0.6  PROT 8.1  --  7.1  ALBUMIN 3.0* 1.9* 2.1*   No results for input(s): "LIPASE", "AMYLASE" in the last 168 hours. No results for input(s): "AMMONIA" in the last 168 hours. CBC: Recent Labs  Lab 03/26/24 1047 03/27/24 0354 03/28/24 0447  WBC 7.8 6.8 7.0  NEUTROABS 5.3  --  4.0  HGB 10.1* 9.6* 9.4*  HCT 32.3* 30.2* 30.0*  MCV 80.0 81.4 82.6  PLT 454* 399 411*   Cardiac Enzymes: No results for input(s): "CKTOTAL", "CKMB", "CKMBINDEX", "TROPONINI" in the last 168 hours. BNP: BNP (last 3 results) No results for input(s): "BNP" in the last 8760 hours.  ProBNP (last 3 results) No results for input(s): "PROBNP" in the last 8760 hours.  CBG: Recent Labs  Lab 03/27/24 1219 03/27/24 1621 03/27/24 2044 03/28/24 0717 03/28/24 1211  GLUCAP 118* 180* 139* 110* 167*       Signed:  Donnetta Gains MD.  [facility] 03/28/2024, 2:46 PM

## 2024-03-28 NOTE — TOC Initial Note (Signed)
 Transition of Care [facility]) - Initial/Assessment Note    Patient Details  Name: Sara [name] MRN: [id number] Date of Birth: 04-Feb-1941  [facility] CM/SW Contact:    Amaryllis Junior, LCSW Phone Number: 03/28/2024, 1:42 PM  Clinical Narrative:                 Pt from home with spouse. Pt recommended for HHPT/OT. HH set up with [facility]. No DME recommended. Pt continues medical workup. TOC following for additional dc needs.  Expected Discharge Plan: Home w Home Health Services Barriers to Discharge: Continued Medical Work up   Patient Goals and CMS Choice Patient states their goals for this hospitalization and ongoing recovery are:: return home CMS Medicare.gov Compare Post Acute Care list provided to::  (NA) Choice offered to / list presented to : NA  ownership interest in [facility].provided to::  (NA)    Expected Discharge Plan and Services   Discharge Planning Services: NA Post Acute Care Choice: Home Health Living arrangements for the past 2 months: Single Family Home                 DME Arranged: N/A DME Agency: NA       HH Arranged: PT, OT HH Agency: [facility] Date HH Agency Contacted: 03/28/24 Time HH Agency Contacted: 1341 Representative spoke with at [facility] Agency: Shelvy Dickens  Prior Living Arrangements/Services Living arrangements for the past 2 months: Single Family Home Lives with:: Spouse Patient language and need for interpreter reviewed:: Yes Do you feel safe going back to the place where you live?: Yes      Need for Family Participation in Patient Care: Yes (Comment) Care giver support system in place?: Yes (comment)   Criminal Activity/Legal Involvement Pertinent to Current Situation/Hospitalization: No - Comment as needed  Activities of Daily Living   ADL Screening (condition at time of admission) Independently performs ADLs?: Yes (appropriate for developmental age) Is the patient deaf or have  difficulty hearing?: Yes Does the patient have difficulty seeing, even when wearing glasses/contacts?: Yes Does the patient have difficulty concentrating, remembering, or making decisions?: No  Permission Sought/Granted                  Emotional Assessment Appearance:: Appears stated age Attitude/Demeanor/Rapport: Engaged Affect (typically observed): Accepting Orientation: : Oriented to Self, Oriented to Place, Oriented to  Time, Oriented to Situation Alcohol  / Substance Use: Not Applicable Psych Involvement: No (comment)  Admission diagnosis:  AKI (acute kidney injury) (HCC) [N17.9] Dysarthria [R47.1] Patient Active Problem List   Diagnosis Date Noted   AKI (acute kidney injury) (HCC) 03/26/2024   Dysarthria 03/26/2024   Pain due to onychomycosis of toenails of both feet 03/29/2023   Gastroesophageal reflux disease 09/17/2022   Lung nodule 09/17/2022   Protein-calorie malnutrition, severe 09/06/2022   Acute rhinitis 08/24/2022   Bilateral hearing loss 07/30/2022   Murmur, cardiac 06/01/2022   Tinnitus 06/30/2021   Atherosclerotic vascular disease 09/16/2020   Need for immunization against influenza 09/16/2020   Stage 3a chronic kidney disease (CKD) (HCC) 09/14/2020   Neuropathic pain of both legs 06/23/2018   HTN (hypertension) 02/21/2018   Healthcare maintenance 01/13/2018   Type 2 diabetes mellitus without complication, with long-term current use of insulin  (HCC) 01/13/2018   PCP:  [facility] [address] Pharmacy:   [id number] - [place], [place] - [address] [place] [address] Phone: [phone] Fax: [phone]     Social Drivers  of Health (SDOH) Social History: SDOH Screenings   Food Insecurity: No Food Insecurity (03/26/2024)  Housing: Low Risk  (03/26/2024)  Transportation Needs: No Transportation Needs (03/26/2024)  Utilities: Not At Risk (03/26/2024)  Alcohol  Screen: Low Risk  (07/05/2021)  Depression (PHQ2-9): Low Risk   (04/12/2023)  Financial Resource Strain: Low Risk  (07/05/2021)  Physical Activity: Insufficiently Active (07/05/2021)  Social Connections: Moderately Integrated (03/26/2024)  Stress: No Stress Concern Present (07/05/2021)  Tobacco Use: Low Risk  (03/26/2024)   SDOH Interventions:     Readmission Risk Interventions    03/28/2024    1:40 PM 09/03/2022    2:19 PM  Readmission Risk Prevention Plan  Post Dischage Appt Complete Complete  Medication Screening Complete Complete  Transportation Screening Complete Complete

## 2024-03-30 ENCOUNTER — Other Ambulatory Visit (HOSPITAL_COMMUNITY): Payer: Self-pay

## 2024-04-03 ENCOUNTER — Ambulatory Visit: Admitting: Podiatry

## 2024-04-03 ENCOUNTER — Encounter: Payer: Self-pay | Admitting: Podiatry

## 2024-04-03 DIAGNOSIS — M79674 Pain in right toe(s): Secondary | ICD-10-CM

## 2024-04-03 DIAGNOSIS — B351 Tinea unguium: Secondary | ICD-10-CM

## 2024-04-03 DIAGNOSIS — E119 Type 2 diabetes mellitus without complications: Secondary | ICD-10-CM

## 2024-04-03 DIAGNOSIS — M79675 Pain in left toe(s): Secondary | ICD-10-CM | POA: Diagnosis not present

## 2024-04-03 DIAGNOSIS — Z794 Long term (current) use of insulin: Secondary | ICD-10-CM | POA: Diagnosis not present

## 2024-04-03 LAB — VITAMIN B1: Vitamin B1 (Thiamine): 49.6 nmol/L — ABNORMAL LOW (ref 66.5–200.0)

## 2024-04-03 NOTE — Progress Notes (Signed)
This patient returns to my office for at risk foot care.  This patient requires this care by a professional since this patient will be at risk due to having diabetes.  This patient is unable to cut nails herself since the patient cannot reach her nails.These nails are painful walking and wearing shoes.  This patient presents for at risk foot care today.  General Appearance  Alert, conversant and in no acute stress.  Vascular  Dorsalis pedis and posterior tibial  pulses are weakly  palpable  bilaterally.  Capillary return is within normal limits  bilaterally. Temperature is within normal limits  bilaterally.  Neurologic  Senn-Weinstein monofilament wire test within normal limits  bilaterally. Muscle power within normal limits bilaterally.  Nails Thick disfigured discolored nails with subungual debris  from hallux to fifth toes bilaterally. No evidence of bacterial infection or drainage bilaterally.  Orthopedic  No limitations of motion  feet .  No crepitus or effusions noted.  No bony pathology or digital deformities noted.  HAV   B/L.  Skin  normotropic skin with no porokeratosis noted bilaterally.  No signs of infections or ulcers noted.     Onychomycosis  Pain in right toes  Pain in left toes  Consent was obtained for treatment procedures.   Mechanical debridement of nails 1-5  bilaterally performed with a nail nipper.  Filed with dremel without incident.    Return office visit   3 months                   Told patient to return for periodic foot care and evaluation due to potential at risk complications.   Gardiner Barefoot DPM

## 2024-04-10 ENCOUNTER — Encounter (HOSPITAL_BASED_OUTPATIENT_CLINIC_OR_DEPARTMENT_OTHER): Payer: Self-pay | Admitting: Emergency Medicine

## 2024-04-10 ENCOUNTER — Emergency Department (HOSPITAL_BASED_OUTPATIENT_CLINIC_OR_DEPARTMENT_OTHER)

## 2024-04-10 ENCOUNTER — Inpatient Hospital Stay (HOSPITAL_BASED_OUTPATIENT_CLINIC_OR_DEPARTMENT_OTHER)
Admission: EM | Admit: 2024-04-10 | Discharge: 2024-04-13 | DRG: 683 | Disposition: A | Discharge status: Home / Self Care | Attending: Internal Medicine | Admitting: Internal Medicine

## 2024-04-10 ENCOUNTER — Other Ambulatory Visit: Payer: Self-pay

## 2024-04-10 DIAGNOSIS — D649 Anemia, unspecified: Secondary | ICD-10-CM | POA: Diagnosis present

## 2024-04-10 DIAGNOSIS — Z820 Family history of epilepsy and other diseases of the nervous system: Secondary | ICD-10-CM

## 2024-04-10 DIAGNOSIS — E871 Hypo-osmolality and hyponatremia: Secondary | ICD-10-CM | POA: Diagnosis present

## 2024-04-10 DIAGNOSIS — Z7984 Long term (current) use of oral hypoglycemic drugs: Secondary | ICD-10-CM

## 2024-04-10 DIAGNOSIS — N3000 Acute cystitis without hematuria: Secondary | ICD-10-CM | POA: Diagnosis not present

## 2024-04-10 DIAGNOSIS — Z8673 Personal history of transient ischemic attack (TIA), and cerebral infarction without residual deficits: Secondary | ICD-10-CM

## 2024-04-10 DIAGNOSIS — N179 Acute kidney failure, unspecified: Secondary | ICD-10-CM | POA: Diagnosis not present

## 2024-04-10 DIAGNOSIS — Z79899 Other long term (current) drug therapy: Secondary | ICD-10-CM

## 2024-04-10 DIAGNOSIS — B952 Enterococcus as the cause of diseases classified elsewhere: Secondary | ICD-10-CM | POA: Diagnosis present

## 2024-04-10 DIAGNOSIS — R627 Adult failure to thrive: Secondary | ICD-10-CM | POA: Diagnosis present

## 2024-04-10 DIAGNOSIS — Z794 Long term (current) use of insulin: Secondary | ICD-10-CM

## 2024-04-10 DIAGNOSIS — R5381 Other malaise: Secondary | ICD-10-CM | POA: Diagnosis present

## 2024-04-10 DIAGNOSIS — E875 Hyperkalemia: Secondary | ICD-10-CM | POA: Diagnosis present

## 2024-04-10 DIAGNOSIS — E1122 Type 2 diabetes mellitus with diabetic chronic kidney disease: Secondary | ICD-10-CM

## 2024-04-10 DIAGNOSIS — D631 Anemia in chronic kidney disease: Secondary | ICD-10-CM | POA: Diagnosis present

## 2024-04-10 DIAGNOSIS — Z8249 Family history of ischemic heart disease and other diseases of the circulatory system: Secondary | ICD-10-CM

## 2024-04-10 DIAGNOSIS — I129 Hypertensive chronic kidney disease with stage 1 through stage 4 chronic kidney disease, or unspecified chronic kidney disease: Secondary | ICD-10-CM | POA: Diagnosis present

## 2024-04-10 DIAGNOSIS — Z833 Family history of diabetes mellitus: Secondary | ICD-10-CM

## 2024-04-10 DIAGNOSIS — E119 Type 2 diabetes mellitus without complications: Secondary | ICD-10-CM

## 2024-04-10 DIAGNOSIS — E861 Hypovolemia: Secondary | ICD-10-CM | POA: Diagnosis present

## 2024-04-10 DIAGNOSIS — N390 Urinary tract infection, site not specified: Secondary | ICD-10-CM | POA: Diagnosis present

## 2024-04-10 DIAGNOSIS — N1832 Chronic kidney disease, stage 3b: Secondary | ICD-10-CM | POA: Diagnosis present

## 2024-04-10 DIAGNOSIS — E785 Hyperlipidemia, unspecified: Secondary | ICD-10-CM | POA: Diagnosis present

## 2024-04-10 LAB — COMPREHENSIVE METABOLIC PANEL WITH GFR
ALT: 9 U/L (ref 0–44)
AST: 19 U/L (ref 15–41)
Albumin: 3.7 g/dL (ref 3.5–5.0)
Alkaline Phosphatase: 72 U/L (ref 38–126)
Anion gap: 14 (ref 5–15)
BUN: 60 mg/dL — ABNORMAL HIGH (ref 8–23)
CO2: 22 mmol/L (ref 22–32)
Calcium: 11.3 mg/dL — ABNORMAL HIGH (ref 8.9–10.3)
Chloride: 95 mmol/L — ABNORMAL LOW (ref 98–111)
Creatinine, Ser: 2.91 mg/dL — ABNORMAL HIGH (ref 0.44–1.00)
GFR, Estimated: 15 mL/min — ABNORMAL LOW (ref >60)
Glucose, Bld: 106 mg/dL — ABNORMAL HIGH (ref 70–99)
Potassium: 5.2 mmol/L — ABNORMAL HIGH (ref 3.5–5.1)
Sodium: 131 mmol/L — ABNORMAL LOW (ref 135–145)
Total Bilirubin: 0.5 mg/dL (ref 0.0–1.2)
Total Protein: 8.2 g/dL — ABNORMAL HIGH (ref 6.5–8.1)

## 2024-04-10 LAB — CBC
HCT: 29.7 % — ABNORMAL LOW (ref 36.0–46.0)
Hemoglobin: 9.5 g/dL — ABNORMAL LOW (ref 12.0–15.0)
MCH: 25.9 pg — ABNORMAL LOW (ref 26.0–34.0)
MCHC: 32 g/dL (ref 30.0–36.0)
MCV: 80.9 fL (ref 80.0–100.0)
Platelets: 464 10*3/uL — ABNORMAL HIGH (ref 150–400)
RBC: 3.67 MIL/uL — ABNORMAL LOW (ref 3.87–5.11)
RDW: 18.2 % — ABNORMAL HIGH (ref 11.5–15.5)
WBC: 11.4 10*3/uL — ABNORMAL HIGH (ref 4.0–10.5)
nRBC: 0 % (ref 0.0–0.2)

## 2024-04-10 LAB — URINALYSIS, ROUTINE W REFLEX MICROSCOPIC
Bilirubin Urine: NEGATIVE
Glucose, UA: NEGATIVE mg/dL
Ketones, ur: 15 mg/dL — AB
Nitrite: NEGATIVE
Protein, ur: >=300 mg/dL — AB
Specific Gravity, Urine: 1.02 (ref 1.005–1.030)
pH: 6.5 (ref 5.0–8.0)

## 2024-04-10 LAB — URINALYSIS, MICROSCOPIC (REFLEX): WBC, UA: >50 WBC/hpf (ref 0–5)

## 2024-04-10 LAB — CBG MONITORING, ED: Glucose-Capillary: 129 mg/dL — ABNORMAL HIGH (ref 70–99)

## 2024-04-10 MED ORDER — SODIUM CHLORIDE 0.9 % IV SOLN
1.0000 g | Freq: Once | INTRAVENOUS | Status: AC
  Administered 2024-04-10: 1 g via INTRAVENOUS
  Filled 2024-04-10: qty 10

## 2024-04-10 MED ORDER — SODIUM CHLORIDE 0.9 % IV BOLUS
1000.0000 mL | Freq: Once | INTRAVENOUS | Status: AC
  Administered 2024-04-10: 1000 mL via INTRAVENOUS

## 2024-04-10 NOTE — ED Provider Notes (Signed)
 [place] [facility] AT [facility] Provider Note   CSN: [id number] Arrival date & time: 04/10/24  4098     Patient presents with: Failure To Thrive, Knee Pain, and Shoulder Pain   Sara [name] is a 83 y.o. female.  With prior history of type 2 diabetes, hypertension and CKD who presents to the ED for generalized weakness.  Patient wasRecently seen in the ED on May 29 and subsequently admitted to hospitalist service given concern for dysarthria, dysphagia poor appetite UTI.  She was also noted to have an AKI with hyperkalemia at that time.  Was discharged on May 31.  She returns today given concern for ongoing generalized weakness    Knee Pain Shoulder Pain      Prior to Admission medications   Medication Sig Start Date End Date Taking? Authorizing Provider  Accu-Chek Softclix Lancets lancets Please use to check blood sugar up to 4 times daily. 08/21/22   Ernestina Headland, MD  ARTIFICIAL TEARS 1 % ophthalmic solution Place 1 drop into both eyes 3 (three) times daily as needed (for dryness).    [provider]  atorvastatin  (LIPITOR) 40 MG tablet Place 1 tablet (40 mg total) into feeding tube daily. Patient taking differently: Take 40 mg by mouth daily. 09/11/22 03/26/24  Ganta, Anupa, DO  blood glucose meter kit and supplies KIT Dispense based on patient and insurance preference. Use up to four times daily as directed. 08/24/22   Wilhemena Harbour, MD  cetirizine  (ZYRTEC ) 10 MG tablet Take 1 tablet (10 mg total) by mouth daily as needed for allergies. 08/24/22   Wilhemena Harbour, MD  diclofenac  Sodium (VOLTAREN ) 1 % GEL Apply 2 g topically 4 (four) times daily. Patient taking differently: Apply 2 g topically 4 (four) times daily as needed (for pain- affected sites). 04/06/21   America Just, DO  esomeprazole  (NEXIUM ) 10 MG packet Take 10 mg by mouth daily before breakfast. 09/17/22   Ernestina Headland, MD  folic acid  (FOLVITE ) 1 MG tablet Take 1 tablet (1  mg total) by mouth daily. 03/28/24 06/26/24  Etter Hermann., MD  gabapentin  (NEURONTIN ) 100 MG capsule Take 100 mg by mouth at bedtime.    [provider]  glucose blood (ACCU-CHEK GUIDE) test strip USE 1  4 TIMES DAILY 10/25/22   Joelle Musca, MD  Insulin  Pen Needle (PEN NEEDLES 3/16) 31G X 5 MM MISC Use as instructed to inject insulin  once daily 11/14/22   Joelle Musca, MD  ketorolac (ACULAR) 0.4 % SOLN Place 1 drop into the left eye 4 (four) times daily.    [provider]  metFORMIN  (GLUCOPHAGE ) 500 MG tablet Place 2 tablets (1,000 mg total) into feeding tube 2 (two) times daily with a meal. Patient taking differently: Take 1,000 mg by mouth 2 (two) times daily with a meal. 09/10/22 03/26/24  Ganta, Anupa, DO  mirtazapine (REMERON) 7.5 MG tablet Take 7.5 mg by mouth at bedtime.    [provider]  moxifloxacin (VIGAMOX) 0.5 % ophthalmic solution Place 1 drop into the left eye 3 (three) times daily.    [provider]  Nutritional Supplements (FEEDING SUPPLEMENT, KATE FARMS STANDARD 1.4,) LIQD liquid Per nutritionist. Previously on Glucerna 350mL 4x/d (~6 cartons per day), 60mL of free water before and after each bolus feed ( 4x/d) 10/15/22   Ernestina Headland, MD  ondansetron  (ZOFRAN ) 8 MG tablet TAKE 1 TABLET BY MOUTH EVERY 8 HOURS AS NEEDED FOR UP TO 5 DAYS FOR NAUSEA  FOR VOMITING Patient taking differently: Take 8 mg by mouth every 8 (eight) hours as needed for nausea or vomiting. 09/24/22   Ernestina Headland, MD  Oral Hygiene Products (Q-CARE Q2 ORAL CLEANS/SUCTION) KIT Use as directed 1 kit in the mouth or throat as needed. 09/14/22   Ernestina Headland, MD  prednisoLONE acetate (PRED FORTE) 1 % ophthalmic suspension Place 1 drop into the left eye 3 (three) times daily.    [provider]    Allergies: Patient has no known allergies.    Review of Systems  Updated Vital Signs BP 111/66   Pulse 93   Temp 97.6 F (36.4 C) (Oral)   Resp 14    Ht 5' 1 (1.549 m)   Wt 41.3 kg   SpO2 100%   BMI 17.19 kg/m   Physical Exam Vitals and nursing note reviewed.  Constitutional:      Comments: Cachectic  HENT:     Head: Normocephalic and atraumatic.   Eyes:     Pupils: Pupils are equal, round, and reactive to light.    Cardiovascular:     Rate and Rhythm: Normal rate and regular rhythm.  Pulmonary:     Effort: Pulmonary effort is normal.     Breath sounds: Normal breath sounds.  Abdominal:     Palpations: Abdomen is soft.     Tenderness: There is no abdominal tenderness.   Musculoskeletal:     Comments: Symmetric weakness of bilateral lower extremities   Skin:    General: Skin is warm and dry.   Neurological:     General: No focal deficit present.     Mental Status: She is alert and oriented to person, place, and time.     Sensory: No sensory deficit.     Motor: No weakness.   Psychiatric:        Mood and Affect: Mood normal.     (all labs ordered are listed, but only abnormal results are displayed) Labs Reviewed  COMPREHENSIVE METABOLIC PANEL WITH GFR - Abnormal; Notable for the following components:      Result Value   Sodium 131 (*)    Potassium 5.2 (*)    Chloride 95 (*)    Glucose, Bld 106 (*)    BUN 60 (*)    Creatinine, Ser 2.91 (*)    Calcium  11.3 (*)    Total Protein 8.2 (*)    GFR, Estimated 15 (*)    All other components within normal limits  CBC - Abnormal; Notable for the following components:   WBC 11.4 (*)    RBC 3.67 (*)    Hemoglobin 9.5 (*)    HCT 29.7 (*)    MCH 25.9 (*)    RDW 18.2 (*)    Platelets 464 (*)    All other components within normal limits  URINALYSIS, ROUTINE W REFLEX MICROSCOPIC - Abnormal; Notable for the following components:   Color, Urine STRAW (*)    APPearance HAZY (*)    Hgb urine dipstick LARGE (*)    Ketones, ur 15 (*)    Protein, ur >=300 (*)    Leukocytes,Ua MODERATE (*)    All other components within normal limits  URINALYSIS, MICROSCOPIC (REFLEX)  - Abnormal; Notable for the following components:   Bacteria, UA FEW (*)    All other components within normal limits  CBG MONITORING, ED - Abnormal; Notable for the following components:   Glucose-Capillary 129 (*)    All other components within normal limits  URINE  CULTURE    EKG: EKG Interpretation Date/Time:  Friday April 10 2024 18:58:01 EDT Ventricular Rate:  103 PR Interval:  143 QRS Duration:  85 QT Interval:  314 QTC Calculation: 411 R Axis:   70  Text Interpretation: Sinus tachycardia Consider right atrial enlargement Confirmed by Rafael Bun [phone]) on 04/10/2024 7:48:50 PM  Radiology: Lenell Query Chest Portable 1 View Result Date: 04/10/2024 CLINICAL DATA:  Right knee weakness shoulder pain EXAM: PORTABLE CHEST 1 VIEW COMPARISON:  03/26/2024 FINDINGS: The heart size and mediastinal contours are within normal limits. Aortic atherosclerosis. Both lungs are clear. The visualized skeletal structures are unremarkable. IMPRESSION: No active disease. Electronically Signed   By: Esmeralda Hedge M.D.   On: 04/10/2024 20:10     Procedures   Medications Ordered in the ED  cefTRIAXone  (ROCEPHIN ) 1 g in sodium chloride  0.9 % 100 mL IVPB (1 g Intravenous New Bag/Given 04/10/24 2214)  sodium chloride  0.9 % bolus 1,000 mL (0 mLs Intravenous Stopped 04/10/24 2040)    Clinical Course as of 04/10/24 2231  Fri Apr 10, 2024  2230 Laboratory workup notable for acute renal failure.  Hyperkalemia of 5.2.  No hyperkalemia ECG changes.  Questionable recurrence of UTI.  Discussed with admitting hospitalist accept patient for admission [MP]    Clinical Course User Index [MP] Sallyanne Creamer, DO                                 Medical Decision Making Amount and/or Complexity of Data Reviewed Labs: ordered. Radiology: ordered.  Risk Decision regarding hospitalization.        Final diagnoses:  Acute renal failure, unspecified acute renal failure type (HCC)  Hyperkalemia  Failure to  thrive in adult  Urinary tract infection with hematuria, site unspecified    ED Discharge Orders     None          Sallyanne Creamer, DO 04/10/24 2232

## 2024-04-10 NOTE — ED Notes (Signed)
 [facility] called for transport.

## 2024-04-10 NOTE — ED Notes (Signed)
 Family updated as to patient's status.

## 2024-04-10 NOTE — ED Notes (Signed)
 Lab contacted regarding adding on the urine culture

## 2024-04-10 NOTE — Progress Notes (Signed)
 Plan of Care Note for accepted transfer   Patient: Sara [name] MRN: [id number]   DOA: 04/10/2024  Facility requesting transfer: [facility]   Requesting Provider: Dr. [name] Buys   Reason for transfer: AKI, possible UTI, failure to thrive   Facility course: 83 yr old female with HTN, DM, HLD, CVA, and recent admission presents with general weakness, knee and shoulder pain, and loss of appetite.   SCr is 2.91 (1.52 two weeks ago) and UA is concerning for infectin.   Urine was cultured and she was given IVF and Rocephin .   Plan of care: The patient is accepted for admission to Telemetry unit, at [facility]   Author: Walton Guppy, MD 04/10/2024  Check [email or web address] for on-call coverage.  Nursing staff, Please call [facility] Admits & Consults System-Wide number on Amion as soon as patient's arrival, so appropriate admitting provider can evaluate the pt.

## 2024-04-10 NOTE — ED Triage Notes (Signed)
 Pt POV in wheelchair- pt c/o R knee weakness x1 day, R shoulder pain today. Daughter reports pt with poor po intake since being d/c from hospital 2 weaks ago, reports R leg is dragging. Also reports pt has been having nausea, emesis x3 weeks. Denies recent falls, injury.

## 2024-04-11 ENCOUNTER — Encounter (HOSPITAL_COMMUNITY): Payer: Self-pay | Admitting: Family Medicine

## 2024-04-11 ENCOUNTER — Inpatient Hospital Stay (HOSPITAL_COMMUNITY)

## 2024-04-11 DIAGNOSIS — E871 Hypo-osmolality and hyponatremia: Secondary | ICD-10-CM | POA: Diagnosis present

## 2024-04-11 DIAGNOSIS — Z794 Long term (current) use of insulin: Secondary | ICD-10-CM | POA: Diagnosis not present

## 2024-04-11 DIAGNOSIS — E785 Hyperlipidemia, unspecified: Secondary | ICD-10-CM | POA: Diagnosis present

## 2024-04-11 DIAGNOSIS — Z833 Family history of diabetes mellitus: Secondary | ICD-10-CM | POA: Diagnosis not present

## 2024-04-11 DIAGNOSIS — N390 Urinary tract infection, site not specified: Secondary | ICD-10-CM | POA: Diagnosis present

## 2024-04-11 DIAGNOSIS — E1122 Type 2 diabetes mellitus with diabetic chronic kidney disease: Secondary | ICD-10-CM | POA: Diagnosis present

## 2024-04-11 DIAGNOSIS — N1832 Chronic kidney disease, stage 3b: Secondary | ICD-10-CM | POA: Diagnosis present

## 2024-04-11 DIAGNOSIS — E861 Hypovolemia: Secondary | ICD-10-CM | POA: Diagnosis present

## 2024-04-11 DIAGNOSIS — Z8249 Family history of ischemic heart disease and other diseases of the circulatory system: Secondary | ICD-10-CM | POA: Diagnosis not present

## 2024-04-11 DIAGNOSIS — Z8673 Personal history of transient ischemic attack (TIA), and cerebral infarction without residual deficits: Secondary | ICD-10-CM | POA: Diagnosis not present

## 2024-04-11 DIAGNOSIS — Z79899 Other long term (current) drug therapy: Secondary | ICD-10-CM | POA: Diagnosis not present

## 2024-04-11 DIAGNOSIS — Z820 Family history of epilepsy and other diseases of the nervous system: Secondary | ICD-10-CM | POA: Diagnosis not present

## 2024-04-11 DIAGNOSIS — N179 Acute kidney failure, unspecified: Secondary | ICD-10-CM | POA: Diagnosis present

## 2024-04-11 DIAGNOSIS — R5381 Other malaise: Secondary | ICD-10-CM | POA: Diagnosis present

## 2024-04-11 DIAGNOSIS — I129 Hypertensive chronic kidney disease with stage 1 through stage 4 chronic kidney disease, or unspecified chronic kidney disease: Secondary | ICD-10-CM | POA: Diagnosis present

## 2024-04-11 DIAGNOSIS — R9431 Abnormal electrocardiogram [ECG] [EKG]: Secondary | ICD-10-CM

## 2024-04-11 DIAGNOSIS — E875 Hyperkalemia: Secondary | ICD-10-CM | POA: Diagnosis present

## 2024-04-11 DIAGNOSIS — Z7984 Long term (current) use of oral hypoglycemic drugs: Secondary | ICD-10-CM | POA: Diagnosis not present

## 2024-04-11 DIAGNOSIS — D649 Anemia, unspecified: Secondary | ICD-10-CM | POA: Diagnosis present

## 2024-04-11 DIAGNOSIS — B952 Enterococcus as the cause of diseases classified elsewhere: Secondary | ICD-10-CM | POA: Diagnosis present

## 2024-04-11 DIAGNOSIS — D631 Anemia in chronic kidney disease: Secondary | ICD-10-CM | POA: Diagnosis present

## 2024-04-11 DIAGNOSIS — R627 Adult failure to thrive: Secondary | ICD-10-CM | POA: Diagnosis present

## 2024-04-11 LAB — HEMOGLOBIN A1C
Hgb A1c MFr Bld: 6.3 % — ABNORMAL HIGH (ref 4.8–5.6)
Mean Plasma Glucose: 134.11 mg/dL

## 2024-04-11 LAB — GLUCOSE, CAPILLARY
Glucose-Capillary: 73 mg/dL (ref 70–99)
Glucose-Capillary: 92 mg/dL (ref 70–99)
Glucose-Capillary: 119 mg/dL — ABNORMAL HIGH (ref 70–99)
Glucose-Capillary: 127 mg/dL — ABNORMAL HIGH (ref 70–99)
Glucose-Capillary: 122 mg/dL — ABNORMAL HIGH (ref 70–99)

## 2024-04-11 LAB — BASIC METABOLIC PANEL WITH GFR
Anion gap: 11 (ref 5–15)
BUN: 50 mg/dL — ABNORMAL HIGH (ref 8–23)
CO2: 22 mmol/L (ref 22–32)
Calcium: 10.5 mg/dL — ABNORMAL HIGH (ref 8.9–10.3)
Chloride: 102 mmol/L (ref 98–111)
Creatinine, Ser: 2.63 mg/dL — ABNORMAL HIGH (ref 0.44–1.00)
GFR, Estimated: 18 mL/min — ABNORMAL LOW (ref >60)
Glucose, Bld: 100 mg/dL — ABNORMAL HIGH (ref 70–99)
Potassium: 4.5 mmol/L (ref 3.5–5.1)
Sodium: 135 mmol/L (ref 135–145)

## 2024-04-11 LAB — CBC
HCT: 27.2 % — ABNORMAL LOW (ref 36.0–46.0)
Hemoglobin: 8.6 g/dL — ABNORMAL LOW (ref 12.0–15.0)
MCH: 25.9 pg — ABNORMAL LOW (ref 26.0–34.0)
MCHC: 31.6 g/dL (ref 30.0–36.0)
MCV: 81.9 fL (ref 80.0–100.0)
Platelets: 447 10*3/uL — ABNORMAL HIGH (ref 150–400)
RBC: 3.32 MIL/uL — ABNORMAL LOW (ref 3.87–5.11)
RDW: 18.3 % — ABNORMAL HIGH (ref 11.5–15.5)
WBC: 13.5 10*3/uL — ABNORMAL HIGH (ref 4.0–10.5)
nRBC: 0 % (ref 0.0–0.2)

## 2024-04-11 LAB — ECHOCARDIOGRAM COMPLETE
Area-P 1/2: 4.21 cm2
Height: 61 in
S' Lateral: 1.8 cm
Weight: 1456 [oz_av]

## 2024-04-11 MED ORDER — ACETAMINOPHEN 650 MG RE SUPP: 650.0000 mg | Freq: Four times a day (QID) | RECTAL | Status: DC | PRN

## 2024-04-11 MED ORDER — INSULIN ASPART 100 UNIT/ML IJ SOLN: 0.0000 [IU] | Freq: Three times a day (TID) | INTRAMUSCULAR | Status: DC

## 2024-04-11 MED ORDER — ACETAMINOPHEN 325 MG PO TABS
650.0000 mg | ORAL_TABLET | Freq: Four times a day (QID) | ORAL | Status: DC | PRN
  Administered 2024-04-12: 650 mg via ORAL
  Filled 2024-04-11: qty 2

## 2024-04-11 MED ORDER — INSULIN ASPART 100 UNIT/ML IJ SOLN: 0.0000 [IU] | Freq: Every day | INTRAMUSCULAR | Status: DC

## 2024-04-11 MED ORDER — ATORVASTATIN CALCIUM 40 MG PO TABS
40.0000 mg | ORAL_TABLET | Freq: Every day | ORAL | Status: DC
  Administered 2024-04-11 – 2024-04-13 (×3): 40 mg via ORAL
  Filled 2024-04-11 (×3): qty 1

## 2024-04-11 MED ORDER — MIRTAZAPINE 15 MG PO TABS
15.0000 mg | ORAL_TABLET | Freq: Every day | ORAL | Status: DC
  Administered 2024-04-11 – 2024-04-12 (×2): 15 mg via ORAL
  Filled 2024-04-11 (×2): qty 1

## 2024-04-11 MED ORDER — HEPARIN SODIUM (PORCINE) 5000 UNIT/ML IJ SOLN
5000.0000 [IU] | Freq: Three times a day (TID) | INTRAMUSCULAR | Status: DC
  Administered 2024-04-11 – 2024-04-13 (×7): 5000 [IU] via SUBCUTANEOUS
  Filled 2024-04-11 (×7): qty 1

## 2024-04-11 MED ORDER — ONDANSETRON HCL 4 MG PO TABS: 4.0000 mg | ORAL_TABLET | Freq: Four times a day (QID) | ORAL | Status: DC | PRN

## 2024-04-11 MED ORDER — SODIUM CHLORIDE 0.9% FLUSH
3.0000 mL | Freq: Two times a day (BID) | INTRAVENOUS | Status: DC
  Administered 2024-04-11 – 2024-04-12 (×4): 3 mL via INTRAVENOUS

## 2024-04-11 MED ORDER — SODIUM CHLORIDE 0.9 % IV SOLN: INTRAVENOUS | Status: AC

## 2024-04-11 MED ORDER — SODIUM CHLORIDE 0.9 % IV SOLN
1.0000 g | INTRAVENOUS | Status: DC
  Administered 2024-04-11: 1 g via INTRAVENOUS
  Filled 2024-04-11: qty 10

## 2024-04-11 MED ORDER — ONDANSETRON HCL 4 MG/2ML IJ SOLN: 4.0000 mg | Freq: Four times a day (QID) | INTRAMUSCULAR | Status: DC | PRN

## 2024-04-11 MED ORDER — ALUM & MAG HYDROXIDE-SIMETH 200-200-20 MG/5ML PO SUSP
15.0000 mL | Freq: Once | ORAL | Status: AC
  Administered 2024-04-11: 15 mL via ORAL
  Filled 2024-04-11: qty 30

## 2024-04-11 NOTE — H&P (Signed)
 History and Physical    Sara [name]:[id number] DOB: August 22, 1941 DOA: 04/10/2024  PCP: Maryellen Snare, NP   Patient coming from: Home   Chief Complaint: Generalized weakness, fatigue, loss of appetite   HPI: Sara [name] is an 83 y.o. female with medical history significant for hypertension, hyperlipidemia, type 2 diabetes mellitus, and CKD 3B who presents with generalized weakness, fatigue, and poor oral intake since hospital discharge 2 weeks ago.  Patient reports complete loss of appetite.  She had 2 episodes of vomiting shortly after her hospital discharge but none since.  She has not been experiencing abdominal pain with this.  She has had dysuria but denies flank pain or chills.  She denies focal numbness or weakness.  [facility] ED Course: Upon arrival to the ED, patient is found to be afebrile with mild transient tachycardia and stable BP.  Labs are most notable for sodium 131, potassium 5.2, BUN 60, creatinine 2.91, calcium  11.3, and hemoglobin 9.5.  Chest x-ray is negative for acute findings.    Patient was given a liter of saline, urine was sent for culture, and she was given a gram of IV Rocephin .  She was transferred to [facility] for admission.  Review of Systems:  All other systems reviewed and apart from HPI, are negative.  Past Medical History:  Diagnosis Date   Allergy    Dizziness 02/21/2018   Estrogen deficiency 01/13/2018   Need for prophylactic vaccination against Streptococcus pneumoniae (pneumococcus) 01/13/2018   Normocytic anemia 04/11/2024   Right knee pain 05/29/2019   Routine general medical examination at a health care facility 01/13/2018   Seasonal allergies 06/21/2020   Vaginal itching 06/21/2020   Vision changes 01/13/2018    Past Surgical History:  Procedure Laterality Date   DG THUMB RIGHT HAND ([facility] HX) Right    EYE SURGERY     IR GASTROSTOMY TUBE MOD SED  09/05/2022   IR GASTROSTOMY TUBE REMOVAL  12/04/2022   TUBAL LIGATION       Social History:   reports that she has never smoked. She has never used smokeless tobacco. She reports that she does not drink alcohol  and does not use drugs.  No Known Allergies  Family History  Problem Relation Age of Onset   Heart disease Mother    Diabetes Mother    Other Mother        Visual merchandiser   Alzheimer's disease Father    Cancer Sister    Cancer Brother    Alzheimer's disease Brother    Breast cancer Neg Hx      Prior to Admission medications   Medication Sig Start Date End Date Taking? Authorizing Provider  diclofenac  Sodium (VOLTAREN ) 1 % GEL Apply 2 g topically 4 (four) times daily. Patient taking differently: Apply 2 g topically 4 (four) times daily as needed (for pain- affected sites). 04/06/21  Yes America Just, DO  folic acid  (FOLVITE ) 1 MG tablet Take 1 tablet (1 mg total) by mouth daily. 03/28/24 06/26/24 Yes Etter Hermann., MD  ketorolac (ACULAR) 0.4 % SOLN Place 1 drop into the left eye 4 (four) times daily.   Yes [provider]  mirtazapine (REMERON) 7.5 MG tablet Take 7.5 mg by mouth at bedtime.   Yes [provider]  moxifloxacin (VIGAMOX) 0.5 % ophthalmic solution Place 1 drop into the left eye 3 (three) times daily.   Yes [provider]  Accu-Chek Softclix Lancets lancets Please use to check blood sugar  up to 4 times daily. 08/21/22   Ernestina Headland, MD  ARTIFICIAL TEARS 1 % ophthalmic solution Place 1 drop into both eyes 3 (three) times daily as needed (for dryness).    [provider]  atorvastatin  (LIPITOR) 40 MG tablet Place 1 tablet (40 mg total) into feeding tube daily. Patient taking differently: Take 40 mg by mouth daily. 09/11/22 03/26/24  Ganta, Anupa, DO  blood glucose meter kit and supplies KIT Dispense based on patient and insurance preference. Use up to four times daily as directed. 08/24/22   Wilhemena Harbour, MD  cetirizine  (ZYRTEC ) 10 MG tablet Take 1 tablet (10 mg total) by mouth daily as  needed for allergies. 08/24/22   Wilhemena Harbour, MD  esomeprazole  (NEXIUM ) 10 MG packet Take 10 mg by mouth daily before breakfast. 09/17/22   Ernestina Headland, MD  gabapentin  (NEURONTIN ) 100 MG capsule Take 100 mg by mouth at bedtime.    [provider]  glucose blood (ACCU-CHEK GUIDE) test strip USE 1  4 TIMES DAILY 10/25/22   Joelle Musca, MD  Insulin  Pen Needle (PEN NEEDLES 3/16) 31G X 5 MM MISC Use as instructed to inject insulin  once daily 11/14/22   Joelle Musca, MD  metFORMIN  (GLUCOPHAGE ) 500 MG tablet Place 2 tablets (1,000 mg total) into feeding tube 2 (two) times daily with a meal. Patient taking differently: Take 1,000 mg by mouth 2 (two) times daily with a meal. 09/10/22 03/26/24  Ganta, Anupa, DO  Nutritional Supplements (FEEDING SUPPLEMENT, KATE FARMS STANDARD 1.4,) LIQD liquid Per nutritionist. Previously on Glucerna 4x/d (~6 cartons per day), 60mL of free water before and after each bolus feed ( 4x/d) 10/15/22   Ernestina Headland, MD  ondansetron  (ZOFRAN ) 8 MG tablet TAKE 1 TABLET BY MOUTH EVERY 8 HOURS AS NEEDED FOR UP TO 5 DAYS FOR NAUSEA FOR VOMITING Patient taking differently: Take 8 mg by mouth every 8 (eight) hours as needed for nausea or vomiting. 09/24/22   Ernestina Headland, MD  Oral Hygiene Products (Q-CARE Q2 ORAL CLEANS/SUCTION) KIT Use as directed 1 kit in the mouth or throat as needed. 09/14/22   Ernestina Headland, MD  prednisoLONE acetate (PRED FORTE) 1 % ophthalmic suspension Place 1 drop into the left eye 3 (three) times daily.    [provider]    Physical Exam: Vitals:   04/10/24 2300 04/11/24 0000 04/11/24 0010 04/11/24 0122  BP: 122/67 133/68  100/62  Pulse: 95 100 94 82  Resp: 19 16 20 18   Temp:    (!) 97.5 F (36.4 C)  TempSrc:    Oral  SpO2: 100% 100% 99% 98%  Weight:      Height:        Constitutional: NAD, calm  Eyes: PERTLA, lids and conjunctivae normal ENMT: Mucous membranes are dry. Posterior pharynx clear of any exudate  or lesions.   Neck: supple, no masses  Respiratory: no wheezing, no crackles. No accessory muscle use.  Cardiovascular: S1 & S2 heard, regular rate and rhythm. No extremity edema.   Abdomen: No distension, no tenderness, soft. Bowel sounds active.  Musculoskeletal: no clubbing / cyanosis. No joint deformity upper and lower extremities.   Skin: no significant rashes, lesions, ulcers. Warm, dry, well-perfused. Poor turgor.  Neurologic: CN 2-12 grossly intact. Moving all extremities. Alert and oriented.  Psychiatric: Pleasant. Cooperative.    Labs and Imaging on Admission: I have personally reviewed following labs and imaging studies  CBC: Recent Labs  Lab 04/10/24 1907  WBC 11.4*  HGB  9.5*  HCT 29.7*  MCV 80.9  PLT 464*   Basic Metabolic Panel: Recent Labs  Lab 04/10/24 1938  NA 131*  K 5.2*  CL 95*  CO2 22  GLUCOSE 106*  BUN 60*  CREATININE 2.91*  CALCIUM  11.3*   GFR: Estimated Creatinine Clearance: 9.6 mL/min (A) (by C-G formula based on SCr of 2.91 mg/dL (H)). Liver Function Tests: Recent Labs  Lab 04/10/24 1938  AST 19  ALT 9  ALKPHOS 72  BILITOT 0.5  PROT 8.2*  ALBUMIN 3.7   No results for input(s): LIPASE, AMYLASE in the last 168 hours. No results for input(s): AMMONIA in the last 168 hours. Coagulation Profile: No results for input(s): INR, PROTIME in the last 168 hours. Cardiac Enzymes: No results for input(s): CKTOTAL, CKMB, CKMBINDEX, TROPONINI in the last 168 hours. BNP (last 3 results) No results for input(s): PROBNP in the last 8760 hours. HbA1C: No results for input(s): HGBA1C in the last 72 hours. CBG: Recent Labs  Lab 04/10/24 1911 04/11/24 0229  GLUCAP 129* 73   Lipid Profile: No results for input(s): CHOL, HDL, LDLCALC, TRIG, CHOLHDL, LDLDIRECT in the last 72 hours. Thyroid Function Tests: No results for input(s): TSH, T4TOTAL, FREET4, T3FREE, THYROIDAB in the last 72 hours. Anemia  Panel: No results for input(s): VITAMINB12, FOLATE, FERRITIN, TIBC, IRON, RETICCTPCT in the last 72 hours. Urine analysis:    Component Value Date/Time   COLORURINE STRAW (A) 04/10/2024 2025   APPEARANCEUR HAZY (A) 04/10/2024 2025   LABSPEC 1.020 04/10/2024 2025   PHURINE 6.5 04/10/2024 2025   GLUCOSEU NEGATIVE 04/10/2024 2025   HGBUR LARGE (A) 04/10/2024 2025   BILIRUBINUR NEGATIVE 04/10/2024 2025   BILIRUBINUR negative 06/01/2022 1010   KETONESUR 15 (A) 04/10/2024 2025   PROTEINUR >=300 (A) 04/10/2024 2025   UROBILINOGEN 0.2 06/01/2022 1010   NITRITE NEGATIVE 04/10/2024 2025   LEUKOCYTESUR MODERATE (A) 04/10/2024 2025   Sepsis Labs: @LABRCNTIP (procalcitonin:4,lacticidven:4) )No results found for this or any previous visit (from the past 240 hours).   Radiological Exams on Admission: DG Chest Portable 1 View Result Date: 04/10/2024 CLINICAL DATA:  Right knee weakness shoulder pain EXAM: PORTABLE CHEST 1 VIEW COMPARISON:  03/26/2024 FINDINGS: The heart size and mediastinal contours are within normal limits. Aortic atherosclerosis. Both lungs are clear. The visualized skeletal structures are unremarkable. IMPRESSION: No active disease. Electronically Signed   By: Esmeralda Hedge M.D.   On: 04/10/2024 20:10    EKG: Independently reviewed. Sinus tachycardia, rate 103.   Assessment/Plan   1. AKI superimposed on CKD 3B  - Likely prerenal in setting of poor oral intake    - Continue IVF hydration, renally-dose medications, repeat chem panel in am   2. UTI  - Continue Rocephin , follow culture    3. Debility  - Pt has been generally weak and fatigued since hospital discharge two weeks ago  - Treat UTI and AKI, consult PT   4. Type II DM  - Check CBGs and use low-intensity SSI for now    5. Hyponatremia; hypercalcemia  - Mild hyponatremia noted in setting of hypovolemia; hypercalcemia suspected secondary to dehydration  - Continue IVF hydration and repeat chemistry  panel   6. Anemia  - Appears stable, no overt bleeding     DVT prophylaxis: sq heparin  Code Status: Full  Level of Care: Level of care: Telemetry Medical Family Communication: Daughter updated from ED   Disposition Plan:  Patient is from: home  Anticipated d/c is to: TBD Anticipated d/c  date is: 6/17/'25  Patient currently: Pending improved renal function, treatment of UTI, PT eval  Consults called: None  Admission status: Inpatient     Walton Guppy, MD [facility]  04/11/2024, 3:02 AM

## 2024-04-11 NOTE — Hospital Course (Signed)
 83 y.o. female with medical history significant for hypertension, hyperlipidemia, type 2 diabetes mellitus, and CKD 3B who presents with generalized weakness, fatigue, and poor oral intake since hospital discharge 2 weeks ago.   Patient reports complete loss of appetite.  She had 2 episodes of vomiting shortly after her hospital discharge but none since.  She has not been experiencing abdominal pain with this.  She has had dysuria but denies flank pain or chills.  She denies focal numbness or weakness.   [facility] ED Course: Upon arrival to the ED, patient is found to be afebrile with mild transient tachycardia and stable BP.  Labs are most notable for sodium 131, potassium 5.2, BUN 60, creatinine 2.91, calcium  11.3, and hemoglobin 9.5.  Chest x-ray is negative for acute findings.     Patient was given a liter of saline, urine was sent for culture, and she was given a gram of IV Rocephin .  She was transferred to [facility] for admission.

## 2024-04-11 NOTE — Progress Notes (Signed)
  Echocardiogram 2D Echocardiogram has been performed.  Sara [name] 04/11/2024, 3:41 PM

## 2024-04-11 NOTE — Progress Notes (Signed)
  Progress Note   Patient: Sara [name] ZOX:[id number] DOB: 12-08-1940 DOA: 04/10/2024     0 DOS: the patient was seen and examined on 04/11/2024   Brief hospital course: 83 y.o. female with medical history significant for hypertension, hyperlipidemia, type 2 diabetes mellitus, and CKD 3B who presents with generalized weakness, fatigue, and poor oral intake since hospital discharge 2 weeks ago.   Patient reports complete loss of appetite.  She had 2 episodes of vomiting shortly after her hospital discharge but none since.  She has not been experiencing abdominal pain with this.  She has had dysuria but denies flank pain or chills.  She denies focal numbness or weakness.   [facility] ED Course: Upon arrival to the ED, patient is found to be afebrile with mild transient tachycardia and stable BP.  Labs are most notable for sodium 131, potassium 5.2, BUN 60, creatinine 2.91, calcium  11.3, and hemoglobin 9.5.  Chest x-ray is negative for acute findings.     Patient was given a liter of saline, urine was sent for culture, and she was given a gram of IV Rocephin .  She was transferred to [facility] for admission.  Assessment and Plan: 1. AKI superimposed on CKD 3B  - Likely prerenal in setting of poor oral intake    - Continue IVF hydration, renal function improving thus far   2. UTI  -UA suggestive of UTI -Urine culture pending - Continue empiric Rocephin  for now   3. Debility  - Pt has been generally weak and fatigued since hospital discharge two weeks ago  - Treat UTI and AKI -PT recs for HHPT   4. Type II DM  - Check CBGs and use low-intensity SSI for now     5. Hyponatremia; hypercalcemia  - Mild hyponatremia noted in setting of hypovolemia; hypercalcemia suspected secondary to dehydration  - Cont IVF as tolerated   6. Anemia  - Appears stable, hemodynamically stable   Subjective: Reports feeling better today  Physical Exam: Vitals:   04/11/24 0122 04/11/24 0533 04/11/24  0826 04/11/24 1249  BP: 100/62 123/66 126/66 (!) 122/59  Pulse: 82 (!) 108 97 92  Resp: 18 17 17 16   Temp: (!) 97.5 F (36.4 C) 98.6 F (37 C) 97.6 F (36.4 C) (!) 97.3 F (36.3 C)  TempSrc: Oral Oral    SpO2: 98% 100% 100% 100%  Weight:      Height:       General exam: Awake, laying in bed, in nad Respiratory system: Normal respiratory effort, no wheezing Cardiovascular system: regular rate, s1, s2 Gastrointestinal system: Soft, nondistended, positive BS Central nervous system: CN2-12 grossly intact, strength intact Extremities: Perfused, no clubbing Skin: Normal skin turgor, no notable skin lesions seen Psychiatry: Mood normal // no visual hallucinations   Data Reviewed:  Labs reviewed: Na 135, K 4.5, Cr 2.63, WBC 13.5, Hgb 8.6, Plts 447  Family Communication: Pt in room, family at bedside  Disposition: Status is: Inpatient Remains inpatient appropriate because: severity of illness  Planned Discharge Destination: Home    Author: Cherylle Corwin, MD 04/11/2024 2:46 PM  For on call review [email or web address].

## 2024-04-11 NOTE — Evaluation (Addendum)
 Physical Therapy Evaluation Patient Details Name: Sara [name] MRN: [id number] DOB: 1941-10-19 Today's Date: 04/11/2024  History of Present Illness  Pt is an 83 y.o. female admitted 6/13 with acute renal failure. PMH: HTN, DMII, CVA, HLD, CKD  Clinical Impression  Pt admitted with above diagnosis. PTA pt lived at home with her daughter, mod I mobility/ADLs with recent use of rollator. Pt currently with functional limitations due to the deficits listed below (see PT Problem List). On eval, pt required CGA bed mobility, CGA transfers, and CGA amb 100' with RW. She demo fair sitting balance and poor standing balance. Generalized weakness BUE/LE. Pt will benefit from acute skilled PT to increase their independence and safety with mobility to allow discharge.  Pt was active with HHPT PTA. Recommend resuming HH services.         If plan is discharge home, recommend the following: Help with stairs or ramp for entrance;Assist for transportation;Assistance with cooking/housework   Can travel by private vehicle        Equipment Recommendations None recommended by PT  Recommendations for Other Services       Functional Status Assessment Patient has had a recent decline in their functional status and demonstrates the ability to make significant improvements in function in a reasonable and predictable amount of time.     Precautions / Restrictions Precautions Precautions: Fall Recall of Precautions/Restrictions: Intact      Mobility  Bed Mobility Overal bed mobility: Needs Assistance Bed Mobility: Supine to Sit, Sit to Supine     Supine to sit: HOB elevated, Used rails, Contact guard Sit to supine: Contact guard assist, Used rails   General bed mobility comments: increased time and effort    Transfers Overall transfer level: Needs assistance Equipment used: Rolling walker (2 wheels) Transfers: Sit to/from Stand Sit to Stand: Contact guard assist                 Ambulation/Gait Ambulation/Gait assistance: Contact guard assist Gait Distance (Feet): 100 Feet Assistive device: Rolling walker (2 wheels) Gait Pattern/deviations: Step-through pattern, Decreased stride length Gait velocity: decreased Gait velocity interpretation: <1.31 ft/sec, indicative of household ambulator   General Gait Details: distance to tolerance. Steady gait with RW.  Stairs            Wheelchair Mobility     Tilt Bed    Modified Rankin (Stroke Patients Only)       Balance Overall balance assessment: History of Falls, Needs assistance Sitting-balance support: No upper extremity supported, Feet supported Sitting balance-Leahy Scale: Good     Standing balance support: Bilateral upper extremity supported, During functional activity, Reliant on assistive device for balance Standing balance-Leahy Scale: Poor                               Pertinent Vitals/Pain Pain Assessment Pain Assessment: No/denies pain    Home Living Family/patient expects to be discharged to:: Private residence Living Arrangements: Children Available Help at Discharge: Family;Available 24 hours/day Type of Home: House Home Access: Stairs to enter   [employer] of Steps: 1   Home Layout: One level Home Equipment: Rollator (4 wheels)      Prior Function Prior Level of Function : Independent/Modified Independent             Mobility Comments: recent use of rollator ADLs Comments: independent with basic ADLs, light cooking and laundry, family assist with transportation     Extremity/Trunk  Assessment   Upper Extremity Assessment Upper Extremity Assessment: Right hand dominant;Generalized weakness    Lower Extremity Assessment Lower Extremity Assessment: Generalized weakness    Cervical / Trunk Assessment Cervical / Trunk Assessment: Normal  Communication   Communication Communication: No apparent difficulties    Cognition Arousal:  Alert Behavior During Therapy: WFL for tasks assessed/performed   PT - Cognitive impairments: No apparent impairments                         Following commands: Intact       Cueing Cueing Techniques: Verbal cues     General Comments      Exercises     Assessment/Plan    PT Assessment Patient needs continued PT services  PT Problem List Decreased strength;Decreased activity tolerance;Decreased balance;Decreased mobility;Decreased knowledge of use of DME       PT Treatment Interventions DME instruction;Gait training;Balance training;Stair training;Functional mobility training;Therapeutic activities;Therapeutic exercise;Patient/family education    PT Goals (Current goals can be found in the Care Plan section)  Acute Rehab PT Goals Patient Stated Goal: home PT Goal Formulation: With patient/family Time For Goal Achievement: 04/25/24 Potential to Achieve Goals: Good    Frequency Min 2X/week     Co-evaluation               AM-PAC PT 6 Clicks Mobility  Outcome Measure Help needed turning from your back to your side while in a flat bed without using bedrails?: A Little Help needed moving from lying on your back to sitting on the side of a flat bed without using bedrails?: A Little Help needed moving to and from a bed to a chair (including a wheelchair)?: A Little Help needed standing up from a chair using your arms (e.g., wheelchair or bedside chair)?: A Little Help needed to walk in hospital room?: A Little Help needed climbing 3-5 steps with a railing? : A Little 6 Click Score: 18    End of Session Equipment Utilized During Treatment: Gait belt Activity Tolerance: Patient tolerated treatment well Patient left: in bed;with call bell/phone within reach;with family/visitor present Nurse Communication: Mobility status PT Visit Diagnosis: Muscle weakness (generalized) (M62.81)    Time: 0981-1914 PT Time Calculation (min) (ACUTE ONLY): 18  min   Charges:   PT Evaluation $PT Eval Low Complexity: 1 Low   PT General Charges $$ ACUTE PT VISIT: 1 Visit         Dorothye Gathers., PT  Office # [phone]   Guadelupe Leech 04/11/2024, 11:34 AM

## 2024-04-11 NOTE — Plan of Care (Signed)

## 2024-04-12 DIAGNOSIS — R627 Adult failure to thrive: Secondary | ICD-10-CM

## 2024-04-12 DIAGNOSIS — E875 Hyperkalemia: Secondary | ICD-10-CM

## 2024-04-12 DIAGNOSIS — N179 Acute kidney failure, unspecified: Secondary | ICD-10-CM | POA: Diagnosis not present

## 2024-04-12 LAB — BASIC METABOLIC PANEL WITH GFR
Anion gap: 12 (ref 5–15)
BUN: 36 mg/dL — ABNORMAL HIGH (ref 8–23)
CO2: 15 mmol/L — ABNORMAL LOW (ref 22–32)
Calcium: 9.5 mg/dL (ref 8.9–10.3)
Chloride: 107 mmol/L (ref 98–111)
Creatinine, Ser: 1.92 mg/dL — ABNORMAL HIGH (ref 0.44–1.00)
GFR, Estimated: 26 mL/min — ABNORMAL LOW (ref >60)
Glucose, Bld: 82 mg/dL (ref 70–99)
Potassium: 4.7 mmol/L (ref 3.5–5.1)
Sodium: 134 mmol/L — ABNORMAL LOW (ref 135–145)

## 2024-04-12 LAB — CBC
HCT: 28.8 % — ABNORMAL LOW (ref 36.0–46.0)
Hemoglobin: 8.6 g/dL — ABNORMAL LOW (ref 12.0–15.0)
MCH: 26.2 pg (ref 26.0–34.0)
MCHC: 29.9 g/dL — ABNORMAL LOW (ref 30.0–36.0)
MCV: 87.8 fL (ref 80.0–100.0)
Platelets: 339 10*3/uL (ref 150–400)
RBC: 3.28 MIL/uL — ABNORMAL LOW (ref 3.87–5.11)
RDW: 18.6 % — ABNORMAL HIGH (ref 11.5–15.5)
WBC: 10 10*3/uL (ref 4.0–10.5)
nRBC: 0 % (ref 0.0–0.2)

## 2024-04-12 LAB — GLUCOSE, CAPILLARY
Glucose-Capillary: 76 mg/dL (ref 70–99)
Glucose-Capillary: 88 mg/dL (ref 70–99)
Glucose-Capillary: 102 mg/dL — ABNORMAL HIGH (ref 70–99)
Glucose-Capillary: 104 mg/dL — ABNORMAL HIGH (ref 70–99)

## 2024-04-12 MED ORDER — SODIUM CHLORIDE 0.9 % IV SOLN
1.0000 g | Freq: Three times a day (TID) | INTRAVENOUS | Status: DC
  Administered 2024-04-12 – 2024-04-13 (×3): 1 g via INTRAVENOUS
  Filled 2024-04-12 (×5): qty 1000

## 2024-04-12 MED ORDER — ADULT MULTIVITAMIN W/MINERALS CH
1.0000 | ORAL_TABLET | Freq: Every day | ORAL | Status: DC
  Administered 2024-04-12 – 2024-04-13 (×2): 1 via ORAL
  Filled 2024-04-12 (×2): qty 1

## 2024-04-12 MED ORDER — ENSURE PLUS HIGH PROTEIN PO LIQD
237.0000 mL | Freq: Three times a day (TID) | ORAL | Status: DC
  Administered 2024-04-12 – 2024-04-13 (×3): 237 mL via ORAL

## 2024-04-12 NOTE — Progress Notes (Signed)
  Progress Note   Patient: Sara [name] ZOX:[id number] DOB: August 21, 1941 DOA: 04/10/2024     1 DOS: the patient was seen and examined on 04/12/2024   Brief hospital course: 83 y.o. female with medical history significant for hypertension, hyperlipidemia, type 2 diabetes mellitus, and CKD 3B who presents with generalized weakness, fatigue, and poor oral intake since hospital discharge 2 weeks ago.   Patient reports complete loss of appetite.  She had 2 episodes of vomiting shortly after her hospital discharge but none since.  She has not been experiencing abdominal pain with this.  She has had dysuria but denies flank pain or chills.  She denies focal numbness or weakness.   [facility] ED Course: Upon arrival to the ED, patient is found to be afebrile with mild transient tachycardia and stable BP.  Labs are most notable for sodium 131, potassium 5.2, BUN 60, creatinine 2.91, calcium  11.3, and hemoglobin 9.5.  Chest x-ray is negative for acute findings.     Patient was given a liter of saline, urine was sent for culture, and she was given a gram of IV Rocephin .  She was transferred to [facility] for admission.  Assessment and Plan: 1. AKI superimposed on CKD 3B  - Likely prerenal in setting of poor oral intake    - Continue to encourage hydration as tolerated   2. Enterococcus UTI  -UA suggestive of UTI -Urine culture pos for enterococcus. Sensitivities pending - Will change abx to ampicillin for now   3. Debility  - Pt has been generally weak and fatigued since hospital discharge two weeks ago  - Treat UTI and AKI -PT recs for HHPT   4. Type II DM  - Check CBGs and use low-intensity SSI for now     5. Hyponatremia; hypercalcemia  - Mild hyponatremia noted in setting of hypovolemia; hypercalcemia suspected secondary to dehydration  - Cont IVF as tolerated   6. Anemia  - Appears stable, hemodynamically stable   Subjective: States feeling better  Physical Exam: Vitals:    04/11/24 2024 04/12/24 0325 04/12/24 0815 04/12/24 1611  BP: 124/65 119/75 135/73 124/65  Pulse: 92 93 92 93  Resp: 16 18 17 18   Temp: 98.7 F (37.1 C) 97.6 F (36.4 C) 97.6 F (36.4 C) 97.8 F (36.6 C)  TempSrc: Oral Oral    SpO2: 100% 100% 100% 99%  Weight:      Height:       General exam: Conversant, in no acute distress Respiratory system: normal chest rise, clear, no audible wheezing Cardiovascular system: regular rhythm, s1-s2 Gastrointestinal system: Nondistended, nontender, pos BS Central nervous system: No seizures, no tremors Extremities: No cyanosis, no joint deformities Skin: No rashes, no pallor Psychiatry: Affect normal // no auditory hallucinations   Data Reviewed:  Labs reviewed: Na 134, K 4.7, Cr 1.92, WBC 10.0, Hgb 8.6, Plts 339  Family Communication: Pt in room, family at bedside and over phone  Disposition: Status is: Inpatient Remains inpatient appropriate because: severity of illness  Planned Discharge Destination: Home    Author: Cherylle Corwin, MD 04/12/2024 4:17 PM  For on call review [email or web address].

## 2024-04-12 NOTE — Progress Notes (Signed)
 Initial Nutrition Assessment  DOCUMENTATION CODES:   Underweight  INTERVENTION:   -Continue regular diet -Ensure Plus High Protein po TID, each supplement provides 350 kcal and 20 grams of protein  -MVI with minerals daily  NUTRITION DIAGNOSIS:   Inadequate oral intake related to poor appetite as evidenced by meal completion < 25%, per patient/family report.  GOAL:   Patient will meet greater than or equal to 90% of their needs  MONITOR:   PO intake, Supplement acceptance  REASON FOR ASSESSMENT:   Consult Assessment of nutrition requirement/status  ASSESSMENT:   Pt with medical history significant for hypertension, hyperlipidemia, type 2 diabetes mellitus, and CKD 3B who presents with generalized weakness, fatigue, and poor oral intake since hospital discharge 2 weeks ago PTA.  Pt admitted with AKI superimposed on CKD 3b, UTI, and debility.   Reviewed I/O's: +1.8 L x 24 hours  Pt unavailable at time of visit. Attempted to speak with pt via call to hospital room phone, however, unable to reach. RD unable to obtain further nutrition-related history or complete nutrition-focused physical exam at this time.    Per H&P, pt with poor appetite since hospital discharge 2 weeks ago. She has no had abdominal pain. She had one instance of vomiting since leaving hospital.Noted pt has history of PEG, however, removed in 2024.   Pt currently on a regular diet. Noted meal completions 25%.   Reviewed wt hx; pt has experienced a 22.2% wt loss over the past year, which is significant for time frame.   Pt with history of severe malnutrition, which RD suspects is ongoing. Pt would greatly benefit from addition of oral nutrition supplements.   Medications reviewed and include remeron.  Lab Results  Component Value Date   HGBA1C 6.3 (H) 04/11/2024   PTA DM medications are 1000 mg metformin  BID.   Labs reviewed: Na: 134, CBGS: 76-127 (inpatient orders for glycemic control are 0-6 units  insulin  aspart TID with meals).    Diet Order:   Diet Order             Diet regular Room service appropriate? Yes; Fluid consistency: Thin  Diet effective now                   EDUCATION NEEDS:      Skin:  Skin Assessment: Reviewed RN Assessment  Last BM:  Unknown  Height:   Ht Readings from Last 1 Encounters:  04/10/24 5' 1 (1.549 m)    Weight:   Wt Readings from Last 1 Encounters:  04/10/24 41.3 kg    Ideal Body Weight:  47.7 kg  BMI:  Body mass index is 17.19 kg/m.  Estimated Nutritional Needs:   Kcal:  1650-1850  Protein:  85-100 grams  Fluid:  1.6-1.8 L    Herschel Lords, RD, LDN, CDCES Registered Dietitian III Certified Diabetes Care and Education Specialist If unable to reach this RD, please use RD Inpatient group chat on secure chat between hours of 8am-4 pm daily

## 2024-04-12 NOTE — Plan of Care (Signed)

## 2024-04-13 ENCOUNTER — Other Ambulatory Visit (HOSPITAL_COMMUNITY): Payer: Self-pay

## 2024-04-13 DIAGNOSIS — E875 Hyperkalemia: Secondary | ICD-10-CM | POA: Diagnosis not present

## 2024-04-13 DIAGNOSIS — N179 Acute kidney failure, unspecified: Secondary | ICD-10-CM | POA: Diagnosis not present

## 2024-04-13 DIAGNOSIS — R627 Adult failure to thrive: Secondary | ICD-10-CM | POA: Diagnosis not present

## 2024-04-13 LAB — BASIC METABOLIC PANEL WITH GFR
Anion gap: 7 (ref 5–15)
BUN: 29 mg/dL — ABNORMAL HIGH (ref 8–23)
CO2: 22 mmol/L (ref 22–32)
Calcium: 10 mg/dL (ref 8.9–10.3)
Chloride: 107 mmol/L (ref 98–111)
Creatinine, Ser: 1.75 mg/dL — ABNORMAL HIGH (ref 0.44–1.00)
GFR, Estimated: 29 mL/min — ABNORMAL LOW (ref >60)
Glucose, Bld: 99 mg/dL (ref 70–99)
Potassium: 4.7 mmol/L (ref 3.5–5.1)
Sodium: 136 mmol/L (ref 135–145)

## 2024-04-13 LAB — CBC
HCT: 26.4 % — ABNORMAL LOW (ref 36.0–46.0)
Hemoglobin: 8.2 g/dL — ABNORMAL LOW (ref 12.0–15.0)
MCH: 25.5 pg — ABNORMAL LOW (ref 26.0–34.0)
MCHC: 31.1 g/dL (ref 30.0–36.0)
MCV: 82 fL (ref 80.0–100.0)
Platelets: 207 10*3/uL (ref 150–400)
RBC: 3.22 MIL/uL — ABNORMAL LOW (ref 3.87–5.11)
RDW: 19.1 % — ABNORMAL HIGH (ref 11.5–15.5)
WBC: 9.2 10*3/uL (ref 4.0–10.5)
nRBC: 0 % (ref 0.0–0.2)

## 2024-04-13 LAB — GLUCOSE, CAPILLARY
Glucose-Capillary: 94 mg/dL (ref 70–99)
Glucose-Capillary: 155 mg/dL — ABNORMAL HIGH (ref 70–99)

## 2024-04-13 LAB — URINE CULTURE: Culture: 50000 — AB

## 2024-04-13 MED ORDER — AMOXICILLIN 500 MG PO CAPS
500.0000 mg | ORAL_CAPSULE | Freq: Two times a day (BID) | ORAL | 0 refills | Status: AC
  Filled 2024-04-13: qty 10, 5d supply, fill #0

## 2024-04-13 NOTE — Progress Notes (Signed)
 Physical Therapy Treatment Patient Details Name: Sara [name] MRN: [id number] DOB: Feb 01, 1941 Today's Date: 04/13/2024   History of Present Illness Pt is an 83 y.o. female admitted 6/13 with acute renal failure. PMH: HTN, DMII, CVA, HLD, CKD    PT Comments  Pt sitting on the EoB, daughter doing her hair on entry. Pt eager to get home. Long discussion about energy conservation, and doing things that are truly meaningful first and having others assist her with chores. Pt's daughter report pt has a Rollator but will not use it to go to church. Discussed how important church is to pt socially and that there should be no stigma associated to using Rollator, especially if it allows her to do the things that bring her joy and are important to her spiritually, and socially. Pt and her daughter expressed understanding. Pt ambulated in hallway and able to self regulate distance so that she would not be to fatigued with return to room.     If plan is discharge home, recommend the following: Help with stairs or ramp for entrance;Assist for transportation;Assistance with cooking/housework   Can travel by private vehicle      Yes   Equipment Recommendations  None recommended by PT       Precautions / Restrictions Precautions Precautions: Fall Recall of Precautions/Restrictions: Intact Restrictions Weight Bearing Restrictions Per Provider Order: No     Mobility  Bed Mobility           Sit to supine: Contact guard assist, Used rails   General bed mobility comments: sitting EoB on entry    Transfers Overall transfer level: Needs assistance Equipment used: Rolling walker (2 wheels) Transfers: Sit to/from Stand Sit to Stand: Contact guard assist           General transfer comment: verbal cues for hand placement    Ambulation/Gait Ambulation/Gait assistance: Contact guard assist Gait Distance (Feet): 50 Feet Assistive device: Rolling walker (2 wheels) Gait Pattern/deviations:  Step-through pattern, Decreased stride length Gait velocity: decreased Gait velocity interpretation: <1.31 ft/sec, indicative of household ambulator   General Gait Details: focused on pt determining tolerance. .         Balance Overall balance assessment: History of Falls, Needs assistance Sitting-balance support: No upper extremity supported, Feet supported Sitting balance-Leahy Scale: Good     Standing balance support: Bilateral upper extremity supported, During functional activity, Reliant on assistive device for balance Standing balance-Leahy Scale: Poor                              Communication Communication Communication: No apparent difficulties  Cognition Arousal: Alert Behavior During Therapy: WFL for tasks assessed/performed   PT - Cognitive impairments: No apparent impairments                         Following commands: Intact      Cueing Cueing Techniques: Verbal cues     General Comments General comments (skin integrity, edema, etc.): daughter in room, long discussion about energy conservation      Pertinent Vitals/Pain Pain Assessment Pain Assessment: No/denies pain     PT Goals (current goals can now be found in the care plan section) Acute Rehab PT Goals Patient Stated Goal: home PT Goal Formulation: With patient/family Time For Goal Achievement: 04/25/24 Potential to Achieve Goals: Good Progress towards PT goals: Progressing toward goals    Frequency    Min 2X/week  AM-PAC PT 6 Clicks Mobility   Outcome Measure  Help needed turning from your back to your side while in a flat bed without using bedrails?: A Little Help needed moving from lying on your back to sitting on the side of a flat bed without using bedrails?: A Little Help needed moving to and from a bed to a chair (including a wheelchair)?: A Little Help needed standing up from a chair using your arms (e.g., wheelchair or bedside chair)?: A  Little Help needed to walk in hospital room?: A Little Help needed climbing 3-5 steps with a railing? : A Little 6 Click Score: 18    End of Session Equipment Utilized During Treatment: Gait belt Activity Tolerance: Patient tolerated treatment well Patient left: in bed;with call bell/phone within reach;with family/visitor present Nurse Communication: Mobility status PT Visit Diagnosis: Muscle weakness (generalized) (M62.81)     Time: 0347-4259 PT Time Calculation (min) (ACUTE ONLY): 21 min  Charges:    $Self Care/Home Management: 8-22 PT General Charges $$ ACUTE PT VISIT: 1 Visit                     Sara [name] PT, DPT Acute Rehabilitation Services Please use secure chat or  Call Office [phone]    Verlie Glisson [facility] 04/13/2024, 1:39 PM

## 2024-04-13 NOTE — Plan of Care (Signed)

## 2024-04-13 NOTE — Progress Notes (Signed)
 Transition of Care [facility]) - Inpatient Brief Assessment   Patient Details  Name: Sara [name] MRN: [id number] Date of Birth: 1941-09-10  [facility] CM/SW Contact:    Dane Dung, RN Phone Number: 04/13/2024, 10:27 AM   Clinical Narrative: Patient admitted from home with Acute renal failure.  Patient is already active with [facility] for PT.  [facility] notified and is aware of patient's readmission to the hospital and will continue to follow the patient for continued services.  HH order for PT placed to be co-signed by MD.  No other TOC needs at this time and patient should return home with [facility] services/family when medically stable for discharge.   Transition of Care Asessment: Insurance and Status: (P) Insurance coverage has been reviewed Patient has primary care physician: (P) Yes Home environment has been reviewed: (P) from home Prior level of function:: (P) Rolator Prior/Current Home Services: (P) Current home services (Active with [facility] for PT) Social Drivers of Health Review: (P) SDOH reviewed interventions complete Readmission risk has been reviewed: (P) Yes Transition of care needs: (P) transition of care needs identified, TOC will continue to follow

## 2024-04-13 NOTE — Discharge Summary (Signed)
 Physician Discharge Summary   Patient: Sara [name] MRN: [id number] DOB: [birth year]  Admit date:     04/10/2024  Discharge date: 04/13/24  Discharge Physician: Cherylle Corwin   PCP: Maryellen Snare, NP   Recommendations at discharge:    Follow up with PCP in 1-2 weeks  Discharge Diagnoses: Principal Problem:   Acute renal failure superimposed on stage 3b chronic kidney disease (HCC) Active Problems:   Type 2 diabetes mellitus without complication, with long-term current use of insulin  (HCC)   UTI (urinary tract infection)   Normocytic anemia   Hypercalcemia   Hyponatremia  Resolved Problems:   * No resolved hospital problems. *  Hospital Course: 83 y.o. female with medical history significant for hypertension, hyperlipidemia, type 2 diabetes mellitus, and CKD 3B who presents with generalized weakness, fatigue, and poor oral intake since hospital discharge 2 weeks ago.   Patient reports complete loss of appetite.  She had 2 episodes of vomiting shortly after her hospital discharge but none since.  She has not been experiencing abdominal pain with this.  She has had dysuria but denies flank pain or chills.  She denies focal numbness or weakness.   [facility] ED Course: Upon arrival to the ED, patient is found to be afebrile with mild transient tachycardia and stable BP.  Labs are most notable for sodium 131, potassium 5.2, BUN 60, creatinine 2.91, calcium  11.3, and hemoglobin 9.5.  Chest x-ray is negative for acute findings.     Patient was given a liter of saline, urine was sent for culture, and she was given a gram of IV Rocephin .  She was transferred to [facility] for admission.  Assessment and Plan: 1. AKI superimposed on CKD 3B  - Likely prerenal in setting of poor oral intake    - Continue to encourage hydration as tolerated   2. Enterococcus UTI  -UA suggestive of UTI -Urine culture pos for enterococcus, pan-sensitive - Rocephin  was changed to ampicillin. Pt  improved.  -Pt to complete course of amoxicillin on d/c   3. Debility  - Pt has been generally weak and fatigued since hospital discharge two weeks ago  - Treat UTI and AKI -PT recs for HHPT   4. Type II DM  - Continued with SSI while inpt   5. Hyponatremia; hypercalcemia  - Mild hyponatremia noted in setting of hypovolemia; hypercalcemia suspected secondary to dehydration  - Cont IVF as tolerated   6. Anemia  - Appears stable, hemodynamically stable          Consultants:  Procedures performed:   Disposition: Home Diet recommendation:  Carb modified diet DISCHARGE MEDICATION: Allergies as of 04/13/2024   No Known Allergies      Medication List     STOP taking these medications    metFORMIN  500 MG tablet Commonly known as: GLUCOPHAGE    ondansetron  8 MG tablet Commonly known as: ZOFRAN        TAKE these medications    Accu-Chek Guide test strip Generic drug: glucose blood USE 1  4 TIMES DAILY   Accu-Chek Softclix Lancets lancets Please use to check blood sugar up to 4 times daily.   amoxicillin 500 MG capsule Commonly known as: AMOXIL Take 1 capsule (500 mg total) by mouth 2 (two) times daily for 5 days.   atorvastatin  40 MG tablet Commonly known as: LIPITOR Place 1 tablet (40 mg total) into feeding tube daily. What changed:  how to take this when to take this   blood glucose meter  kit and supplies Kit Dispense based on patient and insurance preference. Use up to four times daily as directed.   cetirizine  10 MG tablet Commonly known as: ZYRTEC  Take 1 tablet (10 mg total) by mouth daily as needed for allergies.   esomeprazole  10 MG packet Commonly known as: NEXIUM  Take 10 mg by mouth daily before breakfast.   folic acid  1 MG tablet Commonly known as: FOLVITE  Take 1 tablet (1 mg total) by mouth daily. What changed: when to take this   ketorolac 0.4 % Soln Commonly known as: ACULAR Place 1 drop into the left eye 4 (four) times daily.    mirtazapine 15 MG tablet Commonly known as: REMERON Take 15 mg by mouth at bedtime.   moxifloxacin 0.5 % ophthalmic solution Commonly known as: VIGAMOX Place 1 drop into the left eye 3 (three) times daily.   Pen Needles 3/16 31G X 5 MM Misc Use as instructed to inject insulin  once daily   prednisoLONE acetate 1 % ophthalmic suspension Commonly known as: PRED FORTE Place 1 drop into the left eye 3 (three) times daily.        Follow-up Information     [facility] Follow up.   Why: [facility] will continue to provide home health services for PT.  Please call the follow up with the [facility] agency to resume services.        Maryellen Snare, NP Follow up in 2 week(s).   Why: Hospital follow up Contact information: [address] [place] [address] [phone]                Discharge Exam: Sara [name] Weights   04/10/24 1843  Weight: 41.3 kg   General exam: Awake, laying in bed, in nad Respiratory system: Normal respiratory effort, no wheezing Cardiovascular system: regular rate, s1, s2 Gastrointestinal system: Soft, nondistended, positive BS Central nervous system: CN2-12 grossly intact, strength intact Extremities: Perfused, no clubbing Skin: Normal skin turgor, no notable skin lesions seen Psychiatry: Mood normal // no visual hallucinations   Condition at discharge: fair  The results of significant diagnostics from this hospitalization (including imaging, microbiology, ancillary and laboratory) are listed below for reference.   Imaging Studies: ECHOCARDIOGRAM COMPLETE Result Date: 04/11/2024    ECHOCARDIOGRAM REPORT   Patient Name:   Sara [name] Date of Exam: 04/11/2024 Medical Rec #:  [id number]         Height:       61.0 in Accession #:    [id number]        Weight:       91.0 lb Date of Birth:  21-Jan-1941         BSA:          1.352 m Patient Age:    83 years          BP:           122/59 mmHg Patient Gender: F                 HR:            107 bpm. Exam Location:  Inpatient Procedure: 2D Echo (Both Spectral and Color Flow Doppler were utilized during            procedure). Indications:    abnormal ecg  History:        Patient has no prior history of Echocardiogram examinations.  Chronic kidney disease, Signs/Symptoms:Murmur; Risk                 Factors:Diabetes, Dyslipidemia and Hypertension.  Sonographer:    Dione Franks RDCS Referring Phys: [phone] Brahm Barbeau K Zaim Nitta IMPRESSIONS  1. Left ventricular ejection fraction, by estimation, is 70 to 75%. The left ventricle has hyperdynamic function. The left ventricle has no regional wall motion abnormalities. Left ventricular diastolic parameters are consistent with Grade I diastolic dysfunction (impaired relaxation).  2. Right ventricular systolic function is hyperdynamic. The right ventricular size is normal. There is normal pulmonary artery systolic pressure.  3. The mitral valve was not well visualized. No evidence of mitral valve regurgitation. No evidence of mitral stenosis. Moderate mitral annular calcification.  4. The aortic valve was not well visualized. Aortic valve regurgitation is not visualized.  5. The inferior vena cava is normal in size with greater than 50% respiratory variability, suggesting right atrial pressure of 3 mmHg. Comparison(s): No prior Echocardiogram. FINDINGS  Left Ventricle: Intracavitary gradient noted related to hyperdynamic function. Left ventricular ejection fraction, by estimation, is 70 to 75%. The left ventricle has hyperdynamic function. The left ventricle has no regional wall motion abnormalities. The left ventricular internal cavity size was normal in size. There is no left ventricular hypertrophy. Left ventricular diastolic parameters are consistent with Grade I diastolic dysfunction (impaired relaxation). Right Ventricle: The right ventricular size is normal. No increase in right ventricular wall thickness. Right ventricular systolic function is  hyperdynamic. There is normal pulmonary artery systolic pressure. The tricuspid regurgitant velocity is 2.69 m/s, and with an assumed right atrial pressure of 3 mmHg, the estimated right ventricular systolic pressure is 31.9 mmHg. Left Atrium: Left atrial size was normal in size. Right Atrium: Right atrial size was normal in size. Pericardium: There is no evidence of pericardial effusion. Mitral Valve: Posterior prolapse suspected. The mitral valve was not well visualized. Moderate mitral annular calcification. No evidence of mitral valve regurgitation. No evidence of mitral valve stenosis. Tricuspid Valve: The tricuspid valve is normal in structure. Tricuspid valve regurgitation is not demonstrated. No evidence of tricuspid stenosis. Aortic Valve: The aortic valve was not well visualized. Aortic valve regurgitation is not visualized. Pulmonic Valve: The pulmonic valve was not well visualized. Pulmonic valve regurgitation is not visualized. No evidence of pulmonic stenosis. Aorta: The aortic root, ascending aorta and aortic arch are all structurally normal, with no evidence of dilitation or obstruction. Venous: The inferior vena cava is normal in size with greater than 50% respiratory variability, suggesting right atrial pressure of 3 mmHg. IAS/Shunts: The atrial septum is grossly normal.  LEFT VENTRICLE PLAX 2D LVIDd:         3.00 cm   Diastology LVIDs:         1.80 cm   LV e' medial:    7.29 cm/s LV PW:         0.70 cm   LV E/e' medial:  11.1 LV IVS:        0.70 cm   LV e' lateral:   9.57 cm/s LVOT diam:     1.50 cm   LV E/e' lateral: 8.5 LV SV:         42 LV SV Index:   31 LVOT Area:     1.77 cm  RIGHT VENTRICLE RV Basal diam:  2.30 cm RV S prime:     12.60 cm/s TAPSE (M-mode): 2.0 cm LEFT ATRIUM             Index  RIGHT ATRIUM          Index LA diam:        2.40 cm 1.78 cm/m   RA Area:     7.90 cm LA Vol (A2C):   21.4 ml 15.83 ml/m  RA Volume:   15.00 ml 11.10 ml/m LA Vol (A4C):   17.5 ml 12.95 ml/m  LA Biplane Vol: 19.8 ml 14.65 ml/m  AORTIC VALVE LVOT Vmax:   150.00 cm/s LVOT Vmean:  99.400 cm/s LVOT VTI:    0.236 m  AORTA Ao Root diam: 2.70 cm MITRAL VALVE                TRICUSPID VALVE MV Area (PHT): 4.21 cm     TR Peak grad:   28.9 mmHg MV Decel Time: 180 msec     TR Vmax:        269.00 cm/s MV E velocity: 81.20 cm/s MV A velocity: 120.00 cm/s  SHUNTS MV E/A ratio:  0.68         Systemic VTI:  0.24 m                             Systemic Diam: 1.50 cm Gloriann Larger MD Electronically signed by Gloriann Larger MD Signature Date/Time: 04/11/2024/4:01:50 PM    Final    DG Chest Portable 1 View Result Date: 04/10/2024 CLINICAL DATA:  Right knee weakness shoulder pain EXAM: PORTABLE CHEST 1 VIEW COMPARISON:  03/26/2024 FINDINGS: The heart size and mediastinal contours are within normal limits. Aortic atherosclerosis. Both lungs are clear. The visualized skeletal structures are unremarkable. IMPRESSION: No active disease. Electronically Signed   By: Esmeralda Hedge M.D.   On: 04/10/2024 20:10   MR LUMBAR SPINE WO CONTRAST Result Date: 03/28/2024 CLINICAL DATA:  83 year old female with bilateral lower extremity weakness. EXAM: MRI LUMBAR SPINE WITHOUT CONTRAST TECHNIQUE: Multiplanar, multisequence MR imaging of the lumbar spine was performed. No intravenous contrast was administered. COMPARISON:  Thoracic MRI today reported separately. CT abdomen 09/03/2022. FINDINGS: Segmentation:  Normal, concordant with the thoracic numbering today. Alignment: Chronic straightening of lumbar lordosis superimposed on mild to moderate dextroconvex lumbar scoliosis. Subtle chronic retrolisthesis of L2 on L3, anterolisthesis of L4 on L5. Vertebrae: Maintained vertebral body height. Chronic degenerative endplate marrow sparing and signal changes at L2-L3 and L4-L5. Vacuum disc at those levels on the prior CT. Intact visible sacrum and SI joints. No marrow edema or evidence of acute osseous abnormality. Conus  medullaris and cauda equina: Conus extends to the T12-L1 level. No lower spinal cord or conus signal abnormality. Generally normal cauda equina nerve roots. Paraspinal and other soft tissues: Trabeculated urinary bladder wall thickening (series 9, image 40), mildly to moderately distended bladder. Negative visible abdominal viscera. Some lower lumbar erector spinae muscle, paraspinal muscle atrophy. Disc levels: L1-L2:  Negative. L2-L3: Chronic severe disc space loss, mild spondylolisthesis. Leftward circumferential disc osteophyte complex. Moderate left lateral recess stenosis (left L3 nerve level). Severe left L2 foraminal stenosis (series 6, image 11). No spinal stenosis. L3-L4: Mild circumferential disc bulge. Mild facet and ligament flavum hypertrophy. No significant stenosis. L4-L5: Severe disc space loss. Mild anterolisthesis. Circumferential disc osteophyte complex asymmetric to the right. Moderate to severe ligament flavum, moderate right greater than left facet hypertrophy. Severe right lateral recess stenosis (right L5 nerve level series 9, image 26). Severe right L4 neural foraminal stenosis (series 6, image 6). No significant spinal stenosis. L5-S1:  Negative. IMPRESSION: 1. Chronically  advanced lumbar spine degeneration at L2-L3 and L4-L5 in the setting of mild spondylolisthesis, dextroconvex lumbar scoliosis. No significant lumbar spinal stenosis but severe neural foraminal and/or lateral recess stenosis at the left L2, left L3, right L4 and right L5 nerve levels. 2. Very mild for age lumbar spine degeneration elsewhere. 3. Trabeculated urinary bladder wall thickening, query chronic Urinary retention or bladder outlet obstruction. Electronically Signed   By: Marlise Simpers M.D.   On: 03/28/2024 13:18   MR THORACIC SPINE WO CONTRAST Result Date: 03/28/2024 CLINICAL DATA:  83 year old female with bilateral lower extremity weakness. EXAM: MRI THORACIC SPINE WITHOUT CONTRAST TECHNIQUE: Multiplanar,  multisequence MR imaging of the thoracic spine was performed. No intravenous contrast was administered. COMPARISON:  Chest CT 09/24/2022. Brain MRI 03/26/2024. FINDINGS: Limited cervical spine imaging: Partially visible C6-C7 disc degeneration, C7-T1 facet hypertrophy. Thoracic spine segmentation:  Normal on the comparison CT. Alignment: Stable thoracic kyphosis since 2023. No significant scoliosis or spondylolisthesis. Vertebrae: Normal background bone marrow signal. Maintained thoracic vertebral height. No marrow edema or evidence of acute osseous abnormality. Cord: Normal. Normal conus medullaris at T12-L1. Fairly capacious underlying thoracic spinal canal. Paraspinal and other soft tissues: Trace layering pleural effusions (series 33 image 27). Otherwise negative visible chest and upper abdominal viscera. Negative visualized posterior paraspinal soft tissues. Disc levels: Very mild for age thoracic spine degeneration. No disc herniation. No spinal stenosis. No significant thoracic foraminal stenosis. IMPRESSION: 1. Normal for age MRI appearance of the thoracic spine. Normal thoracic spinal cord. 2. Trace layering pleural effusions. Electronically Signed   By: Marlise Simpers M.D.   On: 03/28/2024 13:13   DG Abd 1 View Result Date: 03/26/2024 CLINICAL DATA:  Constipation. EXAM: ABDOMEN - 1 VIEW COMPARISON:  None Available. FINDINGS: Small volume of formed stool in the colon. No abnormal rectal distention. High-density material within bowel in the upper abdomen is likely related to ingested material. No small bowel distension or evidence of obstruction. Vascular calcifications are seen. IMPRESSION: Small volume of formed stool in the colon. No bowel obstruction. Electronically Signed   By: Chadwick Colonel M.D.   On: 03/26/2024 22:38   MR BRAIN WO CONTRAST Result Date: 03/26/2024 CLINICAL DATA:  Acute neurologic deficit EXAM: MRI HEAD WITHOUT CONTRAST TECHNIQUE: Multiplanar, multiecho pulse sequences of the brain  and surrounding structures were obtained without intravenous contrast. COMPARISON:  None Available. FINDINGS: Brain: No acute infarct, mass effect or extra-axial collection. No acute or chronic hemorrhage. There is multifocal hyperintense T2-weighted signal within the white matter. Parenchymal volume and CSF spaces are normal. The midline structures are normal. Vascular: Normal flow voids. Skull and upper cervical spine: Normal calvarium and skull base. Visualized upper cervical spine and soft tissues are normal. Sinuses/Orbits:No paranasal sinus fluid levels or advanced mucosal thickening. No mastoid or middle ear effusion. Normal orbits. IMPRESSION: 1. No acute intracranial abnormality. 2. Chronic microangiopathic white matter changes. Electronically Signed   By: Juanetta Nordmann M.D.   On: 03/26/2024 20:30   DG Chest Port 1 View Result Date: 03/26/2024 CLINICAL DATA:  Fall.  Dysuria.  Question aspiration. EXAM: PORTABLE CHEST 1 VIEW COMPARISON:  09/03/2022 FINDINGS: The lungs are clear without focal pneumonia, edema, pneumothorax or pleural effusion. The cardiopericardial silhouette is within normal limits for size. No acute bony abnormality. Telemetry leads overlie the chest. IMPRESSION: No active disease. Electronically Signed   By: Donnal Fusi M.D.   On: 03/26/2024 12:07   CT HEAD WO CONTRAST Result Date: 03/26/2024 CLINICAL DATA:  Neuro deficit, concern for  stroke, fall today, slurred speech for 2 weeks. EXAM: CT HEAD WITHOUT CONTRAST TECHNIQUE: Contiguous axial images were obtained from the base of the skull through the vertex without intravenous contrast. RADIATION DOSE REDUCTION: This exam was performed according to the departmental dose-optimization program which includes automated exposure control, adjustment of the mA and/or kV according to patient size and/or use of iterative reconstruction technique. COMPARISON:  CTA head and neck 09/13/2022. FINDINGS: Brain: No acute intracranial hemorrhage. No  CT evidence of acute infarct. Remote infarct in the posterior aspect of the left lentiform nuclei. Nonspecific hypoattenuation in the periventricular and subcortical white matter favored to reflect chronic microvascular ischemic changes. No edema, mass effect, or midline shift. The basilar cisterns are patent. Ventricles: The ventricles are normal. Vascular: Atherosclerotic calcifications of the carotid siphons. No hyperdense vessel. Skull: No acute or aggressive finding. Orbits: Orbits are symmetric. Sinuses: The visualized paranasal sinuses are clear. Other: Mastoid air cells are clear. IMPRESSION: No CT evidence of acute intracranial abnormality. Remote infarct involving the left lentiform nuclei. Mild chronic microvascular ischemic changes. Electronically Signed   By: Denny Flack M.D.   On: 03/26/2024 12:07    Microbiology: Results for orders placed or performed during the hospital encounter of 04/10/24  Urine Culture     Status: Abnormal   Collection Time: 04/10/24  8:25 PM   Specimen: Urine, Catheterized  Result Value Ref Range Status   Specimen Description   Final    URINE, CATHETERIZED Performed at [facility], [address] [place], [place] [address]    Special Requests   Final    NONE Performed at [facility], [address] [place], [place] [address]    Culture 50,000 COLONIES/mL ENTEROCOCCUS FAECALIS (A)  Final   Report Status 04/13/2024 FINAL  Final   Organism ID, Bacteria ENTEROCOCCUS FAECALIS (A)  Final      Susceptibility   Enterococcus faecalis - MIC*    AMPICILLIN <=2 SENSITIVE Sensitive     NITROFURANTOIN <=16 SENSITIVE Sensitive     VANCOMYCIN 1 SENSITIVE Sensitive     * 50,000 COLONIES/mL ENTEROCOCCUS FAECALIS    Labs: CBC: Recent Labs  Lab 04/10/24 1907 04/11/24 0415 04/12/24 0545 04/13/24 0423  WBC 11.4* 13.5* 10.0 9.2  HGB 9.5* 8.6* 8.6* 8.2*  HCT 29.7* 27.2* 28.8* 26.4*  MCV 80.9 81.9 87.8 82.0  PLT 464* 447* 339 207    Basic Metabolic Panel: Recent Labs  Lab 04/10/24 1938 04/11/24 0415 04/12/24 0545 04/13/24 0423  NA 131* 135 134* 136  K 5.2* 4.5 4.7 4.7  CL 95* 102 107 107  CO2 22 22 15* 22  GLUCOSE 106* 100* 82 99  BUN 60* 50* 36* 29*  CREATININE 2.91* 2.63* 1.92* 1.75*  CALCIUM  11.3* 10.5* 9.5 10.0   Liver Function Tests: Recent Labs  Lab 04/10/24 1938  AST 19  ALT 9  ALKPHOS 72  BILITOT 0.5  PROT 8.2*  ALBUMIN 3.7   CBG: Recent Labs  Lab 04/12/24 0815 04/12/24 1428 04/12/24 1827 04/12/24 2055 04/13/24 0745  GLUCAP 76 88 102* 104* 94    Discharge time spent: less than 30 minutes.  Signed: Cherylle Corwin, MD [facility] 04/13/2024

## 2024-04-24 ENCOUNTER — Emergency Department (HOSPITAL_COMMUNITY)

## 2024-04-24 ENCOUNTER — Inpatient Hospital Stay (HOSPITAL_COMMUNITY)
Admission: EM | Admit: 2024-04-24 | Discharge: 2024-04-26 | DRG: 683 | Disposition: A | Discharge status: Home / Self Care | Attending: Internal Medicine | Admitting: Internal Medicine

## 2024-04-24 ENCOUNTER — Other Ambulatory Visit: Payer: Self-pay

## 2024-04-24 ENCOUNTER — Encounter (HOSPITAL_COMMUNITY): Payer: Self-pay

## 2024-04-24 DIAGNOSIS — R1310 Dysphagia, unspecified: Secondary | ICD-10-CM | POA: Diagnosis present

## 2024-04-24 DIAGNOSIS — D631 Anemia in chronic kidney disease: Secondary | ICD-10-CM | POA: Diagnosis present

## 2024-04-24 DIAGNOSIS — E785 Hyperlipidemia, unspecified: Secondary | ICD-10-CM | POA: Diagnosis present

## 2024-04-24 DIAGNOSIS — Z681 Body mass index (BMI) 19 or less, adult: Secondary | ICD-10-CM | POA: Diagnosis not present

## 2024-04-24 DIAGNOSIS — Z833 Family history of diabetes mellitus: Secondary | ICD-10-CM

## 2024-04-24 DIAGNOSIS — E871 Hypo-osmolality and hyponatremia: Secondary | ICD-10-CM | POA: Diagnosis present

## 2024-04-24 DIAGNOSIS — E875 Hyperkalemia: Secondary | ICD-10-CM | POA: Diagnosis present

## 2024-04-24 DIAGNOSIS — I9589 Other hypotension: Secondary | ICD-10-CM | POA: Diagnosis present

## 2024-04-24 DIAGNOSIS — R627 Adult failure to thrive: Secondary | ICD-10-CM | POA: Diagnosis present

## 2024-04-24 DIAGNOSIS — E1122 Type 2 diabetes mellitus with diabetic chronic kidney disease: Secondary | ICD-10-CM | POA: Diagnosis present

## 2024-04-24 DIAGNOSIS — E872 Acidosis, unspecified: Secondary | ICD-10-CM | POA: Diagnosis present

## 2024-04-24 DIAGNOSIS — N1832 Chronic kidney disease, stage 3b: Secondary | ICD-10-CM | POA: Diagnosis present

## 2024-04-24 DIAGNOSIS — B379 Candidiasis, unspecified: Secondary | ICD-10-CM | POA: Diagnosis present

## 2024-04-24 DIAGNOSIS — I129 Hypertensive chronic kidney disease with stage 1 through stage 4 chronic kidney disease, or unspecified chronic kidney disease: Secondary | ICD-10-CM | POA: Diagnosis present

## 2024-04-24 DIAGNOSIS — N179 Acute kidney failure, unspecified: Secondary | ICD-10-CM | POA: Diagnosis present

## 2024-04-24 DIAGNOSIS — E860 Dehydration: Secondary | ICD-10-CM | POA: Diagnosis present

## 2024-04-24 DIAGNOSIS — Z794 Long term (current) use of insulin: Secondary | ICD-10-CM | POA: Diagnosis not present

## 2024-04-24 DIAGNOSIS — R1312 Dysphagia, oropharyngeal phase: Secondary | ICD-10-CM | POA: Diagnosis not present

## 2024-04-24 DIAGNOSIS — Z820 Family history of epilepsy and other diseases of the nervous system: Secondary | ICD-10-CM | POA: Diagnosis not present

## 2024-04-24 DIAGNOSIS — E46 Unspecified protein-calorie malnutrition: Secondary | ICD-10-CM | POA: Diagnosis present

## 2024-04-24 DIAGNOSIS — Z8249 Family history of ischemic heart disease and other diseases of the circulatory system: Secondary | ICD-10-CM

## 2024-04-24 DIAGNOSIS — E861 Hypovolemia: Secondary | ICD-10-CM | POA: Diagnosis present

## 2024-04-24 LAB — COMPREHENSIVE METABOLIC PANEL WITH GFR
ALT: 11 U/L (ref 0–44)
ALT: 12 U/L (ref 0–44)
AST: 20 U/L (ref 15–41)
AST: 19 U/L (ref 15–41)
Albumin: 2.6 g/dL — ABNORMAL LOW (ref 3.5–5.0)
Albumin: 3 g/dL — ABNORMAL LOW (ref 3.5–5.0)
Alkaline Phosphatase: 37 U/L — ABNORMAL LOW (ref 38–126)
Alkaline Phosphatase: 44 U/L (ref 38–126)
Anion gap: 12 (ref 5–15)
Anion gap: 9 (ref 5–15)
BUN: 68 mg/dL — ABNORMAL HIGH (ref 8–23)
BUN: 76 mg/dL — ABNORMAL HIGH (ref 8–23)
CO2: 17 mmol/L — ABNORMAL LOW (ref 22–32)
CO2: 18 mmol/L — ABNORMAL LOW (ref 22–32)
Calcium: 9.5 mg/dL (ref 8.9–10.3)
Calcium: 10 mg/dL (ref 8.9–10.3)
Chloride: 104 mmol/L (ref 98–111)
Chloride: 105 mmol/L (ref 98–111)
Creatinine, Ser: 2.83 mg/dL — ABNORMAL HIGH (ref 0.44–1.00)
Creatinine, Ser: 2.65 mg/dL — ABNORMAL HIGH (ref 0.44–1.00)
GFR, Estimated: 16 mL/min — ABNORMAL LOW (ref >60)
GFR, Estimated: 17 mL/min — ABNORMAL LOW (ref >60)
Glucose, Bld: 93 mg/dL (ref 70–99)
Glucose, Bld: 89 mg/dL (ref 70–99)
Potassium: 5.7 mmol/L — ABNORMAL HIGH (ref 3.5–5.1)
Potassium: 5.2 mmol/L — ABNORMAL HIGH (ref 3.5–5.1)
Sodium: 133 mmol/L — ABNORMAL LOW (ref 135–145)
Sodium: 132 mmol/L — ABNORMAL LOW (ref 135–145)
Total Bilirubin: 0.7 mg/dL (ref 0.0–1.2)
Total Bilirubin: 0.8 mg/dL (ref 0.0–1.2)
Total Protein: 6.2 g/dL — ABNORMAL LOW (ref 6.5–8.1)
Total Protein: 7.1 g/dL (ref 6.5–8.1)

## 2024-04-24 LAB — CBC WITH DIFFERENTIAL/PLATELET
Abs Immature Granulocytes: 0.02 10*3/uL (ref 0.00–0.07)
Basophils Absolute: 0 10*3/uL (ref 0.0–0.1)
Basophils Relative: 0 %
Eosinophils Absolute: 0.1 10*3/uL (ref 0.0–0.5)
Eosinophils Relative: 1 %
HCT: 29.8 % — ABNORMAL LOW (ref 36.0–46.0)
Hemoglobin: 9 g/dL — ABNORMAL LOW (ref 12.0–15.0)
Immature Granulocytes: 0 %
Lymphocytes Relative: 21 %
Lymphs Abs: 1.4 10*3/uL (ref 0.7–4.0)
MCH: 26.9 pg (ref 26.0–34.0)
MCHC: 30.2 g/dL (ref 30.0–36.0)
MCV: 89.2 fL (ref 80.0–100.0)
Monocytes Absolute: 0.3 10*3/uL (ref 0.1–1.0)
Monocytes Relative: 4 %
Neutro Abs: 5.2 10*3/uL (ref 1.7–7.7)
Neutrophils Relative %: 74 %
Platelets: 313 10*3/uL (ref 150–400)
RBC: 3.34 MIL/uL — ABNORMAL LOW (ref 3.87–5.11)
RDW: 19.1 % — ABNORMAL HIGH (ref 11.5–15.5)
WBC: 7 10*3/uL (ref 4.0–10.5)
nRBC: 0 % (ref 0.0–0.2)

## 2024-04-24 LAB — I-STAT CG4 LACTIC ACID, ED: Lactic Acid, Venous: 1 mmol/L (ref 0.5–1.9)

## 2024-04-24 MED ORDER — ATORVASTATIN CALCIUM 40 MG PO TABS
40.0000 mg | ORAL_TABLET | Freq: Every day | ORAL | Status: DC
  Administered 2024-04-24 – 2024-04-25 (×2): 40 mg via ORAL
  Filled 2024-04-24 (×2): qty 1

## 2024-04-24 MED ORDER — LACTATED RINGERS IV BOLUS
500.0000 mL | Freq: Once | INTRAVENOUS | Status: AC
  Administered 2024-04-24: 500 mL via INTRAVENOUS

## 2024-04-24 MED ORDER — BOOST / RESOURCE BREEZE PO LIQD CUSTOM
1.0000 | Freq: Three times a day (TID) | ORAL | Status: DC
  Administered 2024-04-24 – 2024-04-25 (×2): 1 via ORAL
  Filled 2024-04-24: qty 1

## 2024-04-24 MED ORDER — FLUCONAZOLE 150 MG PO TABS: 150.0000 mg | ORAL_TABLET | Freq: Once | ORAL | Status: DC

## 2024-04-24 MED ORDER — HEPARIN SODIUM (PORCINE) 5000 UNIT/ML IJ SOLN
5000.0000 [IU] | Freq: Three times a day (TID) | INTRAMUSCULAR | Status: DC
  Administered 2024-04-24 – 2024-04-26 (×4): 5000 [IU] via SUBCUTANEOUS
  Filled 2024-04-24 (×4): qty 1

## 2024-04-24 MED ORDER — SODIUM BICARBONATE 650 MG PO TABS
650.0000 mg | ORAL_TABLET | Freq: Two times a day (BID) | ORAL | Status: DC
  Administered 2024-04-24 – 2024-04-25 (×3): 650 mg via ORAL
  Filled 2024-04-24 (×4): qty 1

## 2024-04-24 MED ORDER — SODIUM CHLORIDE 0.9 % IV SOLN: INTRAVENOUS | Status: AC

## 2024-04-24 NOTE — H&P (Signed)
 History and Physical    Sara [name] FMW:[id number] DOB: July 03, 1941 DOA: 04/24/2024  PCP: Sara Reynolds, NP Patient coming from:  sent to the ED from doctor's office   I have personally briefly reviewed patient's old medical records in [facility]  Chief Complaint: hypotension   HPI: Sara [name] is a 83 y.o. female with medical history significant of  hypertension, hyperlipidemia, type 2 diabetes mellitus off meds recently, and CKD 3B with recent admission on 5/29 and 6/13 for initial concern for CVA with neg work up but AKI and then return for AKI and UTI pansensitive who is returning to the ER today due to having hypotension at her doctor's office today.  Family reports patient has been having trouble swallowing since 02/2024, having poor appetite ,ongoing weight loss, she denies pain, no fever. She reports having trouble with solid intermittently, regurgitate food intermittently , denies odynophagia.   ED Course:   Data reviewed:  Blood pressure 131/73, pulse 84, temperature 97.7 F (36.5 C), temperature source Oral, resp. rate 17, SpO2 100%.  Labs Reviewed  CBC WITH DIFFERENTIAL/PLATELET - Abnormal; Notable for the following components:      Result Value   RBC 3.34 (*)    Hemoglobin 9.0 (*)    HCT 29.8 (*)    RDW 19.1 (*)    All other components within normal limits  COMPREHENSIVE METABOLIC PANEL WITH GFR - Abnormal; Notable for the following components:   Sodium 133 (*)    Potassium 5.7 (*)    CO2 17 (*)    BUN 68 (*)    Creatinine, Ser 2.83 (*)    Total Protein 6.2 (*)    Albumin 2.6 (*)    Alkaline Phosphatase 37 (*)    GFR, Estimated 16 (*)    All other components within normal limits  COMPREHENSIVE METABOLIC PANEL WITH GFR - Abnormal; Notable for the following components:   Sodium 132 (*)    Potassium 5.2 (*)    CO2 18 (*)    BUN 76 (*)    Creatinine, Ser 2.65 (*)    Albumin 3.0 (*)    GFR, Estimated 17 (*)    All other components within normal  limits  CULTURE, BLOOD (ROUTINE X 2)  CULTURE, BLOOD (ROUTINE X 2)  MAGNESIUM   PHOSPHORUS  URINALYSIS, ROUTINE W REFLEX MICROSCOPIC  I-STAT CG4 LACTIC ACID, ED    Medications Ordered in the ED  sodium bicarbonate tablet 650 mg (has no administration in time range)  feeding supplement (BOOST / RESOURCE BREEZE) liquid 1 Container (has no administration in time range)  atorvastatin  (LIPITOR) tablet 40 mg (has no administration in time range)  fluconazole (DIFLUCAN) tablet 150 mg (has no administration in time range)  lactated ringers  bolus 500 mL (0 mLs Intravenous Stopped 04/24/24 1513)     Review of Systems: As per HPI otherwise all other systems reviewed and are negative.   Past Medical History:  Diagnosis Date   Allergy    Dizziness 02/21/2018   Estrogen deficiency 01/13/2018   Need for prophylactic vaccination against Streptococcus pneumoniae (pneumococcus) 01/13/2018   Normocytic anemia 04/11/2024   Right knee pain 05/29/2019   Routine general medical examination at a health care facility 01/13/2018   Seasonal allergies 06/21/2020   Vaginal itching 06/21/2020   Vision changes 01/13/2018    Past Surgical History:  Procedure Laterality Date   DG THUMB RIGHT HAND ([facility] HX) Right    EYE SURGERY     IR GASTROSTOMY TUBE MOD  SED  09/05/2022   IR GASTROSTOMY TUBE REMOVAL  12/04/2022   TUBAL LIGATION      Social History  reports that she has never smoked. She has never used smokeless tobacco. She reports that she does not drink alcohol  and does not use drugs.  No Known Allergies  Family History  Problem Relation Age of Onset   Heart disease Mother    Diabetes Mother    Other Mother        Visual merchandiser   Alzheimer's disease Father    Cancer Sister    Cancer Brother    Alzheimer's disease Brother    Breast cancer Neg Hx     Prior to Admission medications   Medication Sig Start Date End Date Taking? Authorizing Provider  fluconazole (DIFLUCAN) 150 MG tablet Take 150 mg by  mouth every 3 (three) days. 04/24/24  Yes [provider]  hydrochlorothiazide  (HYDRODIURIL ) 25 MG tablet Take 25 mg by mouth daily. 04/19/24  Yes [provider]  megestrol (MEGACE) 40 MG/ML suspension Take by mouth. 04/24/24  Yes [provider]  nystatin (MYCOSTATIN) 100000 UNIT/ML suspension Take 5 mLs by mouth. 04/24/24  Yes [provider]  pantoprazole (PROTONIX) 20 MG tablet Take 20 mg by mouth daily. 04/24/24  Yes [provider]  Accu-Chek Softclix Lancets lancets Please use to check blood sugar up to 4 times daily. 08/21/22   Sara Bianchi, MD  atorvastatin  (LIPITOR) 40 MG tablet Place 1 tablet (40 mg total) into feeding tube daily. Patient taking differently: Take 40 mg by mouth at bedtime. 09/11/22 04/11/24  Sara [name]  blood glucose meter kit and supplies KIT Dispense based on patient and insurance preference. Use up to four times daily as directed. 08/24/22   Sara Riis, MD  cetirizine  (ZYRTEC ) 10 MG tablet Take 1 tablet (10 mg total) by mouth daily as needed for allergies. 08/24/22   Sara Riis, MD  esomeprazole  (NEXIUM ) 10 MG packet Take 10 mg by mouth daily before breakfast. 09/17/22   Sara Bianchi, MD  folic acid  (FOLVITE ) 1 MG tablet Take 1 tablet (1 mg total) by mouth daily. Patient taking differently: Take 1 mg by mouth at bedtime. 03/28/24 06/26/24  Sara [name]., MD  glucose blood (ACCU-CHEK GUIDE) test strip USE 1  4 TIMES DAILY 10/25/22   Austin Ade, MD  Insulin  Pen Needle (PEN NEEDLES 3/16) 31G X 5 MM MISC Use as instructed to inject insulin  once daily 11/14/22   Austin Ade, MD  ketorolac (ACULAR) 0.4 % SOLN Place 1 drop into the left eye 4 (four) times daily.    [provider]  mirtazapine  (REMERON ) 15 MG tablet Take 15 mg by mouth at bedtime. 04/01/24   [provider]  moxifloxacin (VIGAMOX) 0.5 % ophthalmic solution Place 1 drop into the left eye 3 (three) times daily.    [provider]    Physical Exam: Vitals:   04/24/24 1500 04/24/24 1600 04/24/24 1700 04/24/24 1730  BP: 120/67 131/73 124/65 112/72  Pulse: 82 84    Resp: 17 17    Temp:      TempSrc:      SpO2: 100% 100%      Constitutional: thin, frail, NAD Eyes: PERRL, lids and conjunctivae normal ENMT: Mucous membranes are moist.  Respiratory: clear to auscultation bilaterally, no wheezing, no crackles. Normal respiratory effort. No accessory muscle use.  Cardiovascular: Regular rate and rhythm,  No extremity edema. 2+ pedal pulses. No carotid bruits.  Abdomen: no tenderness, not distended, Bowel sounds positive.  Musculoskeletal: no clubbing / cyanosis. No joint deformity upper and lower extremities. Good ROM, no contractures. Normal muscle tone.  Skin: no rashes, lesions, ulcers. No induration Neurologic: CN 2-12 grossly intact. Sensation intact, Strength 5/5 in all 4.  Psychiatric: Normal judgment and insight. Alert and oriented x 3. Normal mood.     Assessment/Plan Active Problems:   Dysphagia   Hyponatremia   AKI (acute kidney injury) (HCC)   Hyperkalemia   Dysphagia Progressive weight loss Failure to thrive  Has been taking off bp meds and diabetes meds recently All pills to be crushed and having trouble with solid She would like to have full liquid ordered and boost for now DG esophagram ordered Speech eval also ordered as per chart review she has a h/o that requested peg tube placement from 11/82023 to 12/04/2022.  Hypotension, likely from dehydration, but will check blood culture   AKI on CKDIII Check ua, renal us  Continue ivf   Hyperkalemia Metabolic acidosis Sodium bicarb supplement  Hyponatremia Continue hydration    IDDM2 Lab Results  Component Value Date   HGBA1C 6.3 (H) 04/11/2024  Reports has been off all diabetes meds recently, Start ssi here   Anemia of chronic disease Hgb at baseline   Yeast infection Reports due to recent abx use Dilufcan  150mg  x1 ordered   DVT prophylaxis: heparin    Code Status:   Full code  Family Communication:    Patient is from: home   Anticipated DC to:  home  Anticipated DC date:  2-3 days    Consults called:  none Admission status:  Inpatient   Severity of Illness:   The appropriate patient status for this patient is INPATIENT due to history and comorbidities, severity of illness, required intensity of service to ensure the patient's safety and to avoid risk of adverse events/further clinical deterioration.  Severity of illness/comorbidities: Dysphagia, Progressive weight loss, Failure to thrive , aki, hyponatremia, hyperkalemia, metabolic acidosis  Intensity of service: tests, high frequency of surveillance, interventions It is not anticipated that the patient will be medically stable for discharge from the hospital within 2 midnights of admission.    Voice Recognition Betti dictation system was used to create this note, attempts have been made to correct errors. Please contact the author with questions and/or clarifications.  Ileana Cummins MD PhD FACP [facility]  How to contact the [facility] Attending or Consulting provider 7A - 7P or covering provider during after hours 7P -7A, for this patient?   Check the care team in [facility] and look for a) attending/consulting TR[facility] provider listed and b) the TRH team listed Log into [email or web address] and use [facility]'s universal password to access. If you [name] not have the password, please contact the hospital operator. Locate the TRH provider you are looking for under [facility] and page to a number that you can be directly reached. If you still have difficulty reaching the provider, please page the [facility] (Director on Call) for the Hospitalists listed on amion for assistance.  04/24/2024, 5:52 PM

## 2024-04-24 NOTE — ED Provider Notes (Signed)
 [place] EMERGENCY DEPARTMENT AT [facility] Provider Note   CSN: [id number] Arrival date & time: 04/24/24  1255     Patient presents with: Hypotension   Sara [name] is a 83 y.o. female.   Pt is an 83y/o female with hx of hypertension, hyperlipidemia, type 2 diabetes mellitus, and CKD 3B with recent admission on 5/29 and 6/13 for initial concern for CVA with neg work up but AKI and then return for AKI and UTI pansensitive who is returning to the ER today due to having hypotension at her doctor's office today.  Patient was therefore a follow-up visit after her hospitalization.  She has completed her course of antibiotics and repeat urine was cleared.  She has still had poor oral intake per her daughter and the patient reports she is just not hungry.  She continues to lose weight.  Patient had no specific complaints today except feeling slightly dizzy when she got up.  EMS report first blood pressure was in the 70s and after 500 mL of fluid last blood pressure was in the low 100s.  Patient denies any abdominal pain, nausea or vomiting.  She was at [facility] today and they did give her a prescription for a yeast infection.  She denies cough, congestion or shortness of breath.  The history is provided by the patient, the EMS personnel and medical records.       Prior to Admission medications   Medication Sig Start Date End Date Taking? Authorizing Provider  Accu-Chek Softclix Lancets lancets Please use to check blood sugar up to 4 times daily. 08/21/22   Bryan Bianchi, MD  atorvastatin  (LIPITOR) 40 MG tablet Place 1 tablet (40 mg total) into feeding tube daily. Patient taking differently: Take 40 mg by mouth at bedtime. 09/11/22 04/11/24  Ganta, Anupa, DO  blood glucose meter kit and supplies KIT Dispense based on patient and insurance preference. Use up to four times daily as directed. 08/24/22   Jennelle Riis, MD  cetirizine  (ZYRTEC ) 10 MG tablet Take 1 tablet  (10 mg total) by mouth daily as needed for allergies. 08/24/22   Jennelle Riis, MD  esomeprazole  (NEXIUM ) 10 MG packet Take 10 mg by mouth daily before breakfast. 09/17/22   Bryan Bianchi, MD  folic acid  (FOLVITE ) 1 MG tablet Take 1 tablet (1 mg total) by mouth daily. Patient taking differently: Take 1 mg by mouth at bedtime. 03/28/24 06/26/24  Perri DELENA Meliton Mickey., MD  glucose blood (ACCU-CHEK GUIDE) test strip USE 1  4 TIMES DAILY 10/25/22   Austin Ade, MD  Insulin  Pen Needle (PEN NEEDLES 3/16) 31G X 5 MM MISC Use as instructed to inject insulin  once daily 11/14/22   Austin Ade, MD  ketorolac (ACULAR) 0.4 % SOLN Place 1 drop into the left eye 4 (four) times daily.    [provider]  mirtazapine  (REMERON ) 15 MG tablet Take 15 mg by mouth at bedtime. 04/01/24   [provider]  moxifloxacin (VIGAMOX) 0.5 % ophthalmic solution Place 1 drop into the left eye 3 (three) times daily.    [provider]  prednisoLONE acetate (PRED FORTE) 1 % ophthalmic suspension Place 1 drop into the left eye 3 (three) times daily. Patient not taking: Reported on 04/11/2024    [provider]    Allergies: Patient has no known allergies.    Review of Systems  Updated Vital Signs BP 131/73   Pulse 84   Temp 97.7 F (36.5 C) (Oral)  Resp 17   SpO2 100%   Physical Exam Vitals and nursing note reviewed.  Constitutional:      General: She is not in acute distress.    Appearance: She is well-developed and underweight.  HENT:     Head: Normocephalic and atraumatic.   Eyes:     Pupils: Pupils are equal, round, and reactive to light.    Cardiovascular:     Rate and Rhythm: Normal rate and regular rhythm.     Heart sounds: Normal heart sounds. No murmur heard.    No friction rub.  Pulmonary:     Effort: Pulmonary effort is normal.     Breath sounds: Normal breath sounds. No wheezing or rales.  Abdominal:     General: Bowel sounds are normal. There is no  distension.     Palpations: Abdomen is soft.     Tenderness: There is no abdominal tenderness. There is no guarding or rebound.   Musculoskeletal:        General: No tenderness. Normal range of motion.     Right lower leg: No edema.     Left lower leg: No edema.     Comments: No edema   Skin:    General: Skin is warm and dry.     Findings: No rash.   Neurological:     Mental Status: She is alert and oriented to person, place, and time.     Cranial Nerves: No cranial nerve deficit.   Psychiatric:        Mood and Affect: Mood normal.        Behavior: Behavior normal.     (all labs ordered are listed, but only abnormal results are displayed) Labs Reviewed  CBC WITH DIFFERENTIAL/PLATELET - Abnormal; Notable for the following components:      Result Value   RBC 3.34 (*)    Hemoglobin 9.0 (*)    HCT 29.8 (*)    RDW 19.1 (*)    All other components within normal limits  COMPREHENSIVE METABOLIC PANEL WITH GFR - Abnormal; Notable for the following components:   Sodium 133 (*)    Potassium 5.7 (*)    CO2 17 (*)    BUN 68 (*)    Creatinine, Ser 2.83 (*)    Total Protein 6.2 (*)    Albumin 2.6 (*)    Alkaline Phosphatase 37 (*)    GFR, Estimated 16 (*)    All other components within normal limits  COMPREHENSIVE METABOLIC PANEL WITH GFR    EKG: None  Radiology: No results found.   Procedures   Medications Ordered in the ED  lactated ringers  bolus 500 mL (0 mLs Intravenous Stopped 04/24/24 1513)                                    Medical Decision Making Amount and/or Complexity of Data Reviewed Labs: ordered. Decision-making details documented in ED Course.   Pt with multiple medical problems and comorbidities and presenting today with a complaint that caries a high risk for morbidity and mortality.  Returning here from her PCP's office due to hypotension.  Suspect the hypotension is most likely related to poor intake.  Concern for recurrent AKI.  Patient  otherwise is having no other complaints.  She does not take blood pressure medication at home.  Blood pressure did respond to IV fluids.  Will continue to monitor.  4:16 PM I independently  interpreted patient's labs and CBC with persistent anemia with hemoglobin of 9, normal white count, CMP with recurrent AKI today with creatinine of 2.8 from 1.9 on discharge last week.  Went to discussed this with the patient and her family.  Family reports that patient has still had a lot of difficulty swallowing.  She cannot eat any solid food and only drinks small amounts at a time and they are concerned about her intake.  She continues to lose weight.  At this time feel patient needs readmission for hydration for her AKI but may also need further testing for swallowing.  Will consult the hospitalist for admission.  Blood pressure has responded well to fluids and is now normal.      Final diagnoses:  AKI (acute kidney injury) (HCC)  Hypotension due to hypovolemia    ED Discharge Orders     None          Doretha Folks, MD 04/24/24 1616

## 2024-04-24 NOTE — ED Triage Notes (Signed)
 BIBA from PCP- was found to be hypotensive 76/42, denies any symptoms. Decreased oral intake, UTI for a month finished abx 1 week ago. 500 cc given PTA.  106/66 BP after bolus 87 HR 203 CBG 97% r/a

## 2024-04-25 DIAGNOSIS — N179 Acute kidney failure, unspecified: Secondary | ICD-10-CM

## 2024-04-25 DIAGNOSIS — E871 Hypo-osmolality and hyponatremia: Secondary | ICD-10-CM

## 2024-04-25 DIAGNOSIS — R1312 Dysphagia, oropharyngeal phase: Secondary | ICD-10-CM

## 2024-04-25 DIAGNOSIS — E875 Hyperkalemia: Secondary | ICD-10-CM

## 2024-04-25 LAB — COMPREHENSIVE METABOLIC PANEL WITH GFR
ALT: 13 U/L (ref 0–44)
AST: 19 U/L (ref 15–41)
Albumin: 3.3 g/dL — ABNORMAL LOW (ref 3.5–5.0)
Alkaline Phosphatase: 49 U/L (ref 38–126)
Anion gap: 10 (ref 5–15)
BUN: 67 mg/dL — ABNORMAL HIGH (ref 8–23)
CO2: 21 mmol/L — ABNORMAL LOW (ref 22–32)
Calcium: 10.5 mg/dL — ABNORMAL HIGH (ref 8.9–10.3)
Chloride: 105 mmol/L (ref 98–111)
Creatinine, Ser: 2.7 mg/dL — ABNORMAL HIGH (ref 0.44–1.00)
GFR, Estimated: 17 mL/min — ABNORMAL LOW (ref >60)
Glucose, Bld: 99 mg/dL (ref 70–99)
Potassium: 4.8 mmol/L (ref 3.5–5.1)
Sodium: 136 mmol/L (ref 135–145)
Total Bilirubin: 0.6 mg/dL (ref 0.0–1.2)
Total Protein: 7.9 g/dL (ref 6.5–8.1)

## 2024-04-25 LAB — CBC
HCT: 31 % — ABNORMAL LOW (ref 36.0–46.0)
Hemoglobin: 9.4 g/dL — ABNORMAL LOW (ref 12.0–15.0)
MCH: 26.4 pg (ref 26.0–34.0)
MCHC: 30.3 g/dL (ref 30.0–36.0)
MCV: 87.1 fL (ref 80.0–100.0)
Platelets: 405 10*3/uL — ABNORMAL HIGH (ref 150–400)
RBC: 3.56 MIL/uL — ABNORMAL LOW (ref 3.87–5.11)
RDW: 19 % — ABNORMAL HIGH (ref 11.5–15.5)
WBC: 7.3 10*3/uL (ref 4.0–10.5)
nRBC: 0 % (ref 0.0–0.2)

## 2024-04-25 LAB — MAGNESIUM: Magnesium: 2.7 mg/dL — ABNORMAL HIGH (ref 1.7–2.4)

## 2024-04-25 LAB — PHOSPHORUS: Phosphorus: 3.1 mg/dL (ref 2.5–4.6)

## 2024-04-25 NOTE — Plan of Care (Signed)

## 2024-04-25 NOTE — Progress Notes (Signed)
 PROGRESS NOTE    Sara [name]  FMW:[id number] DOB: 01-Mar-1941 DOA: 04/24/2024 PCP: Campbell Reynolds, NP   Brief Narrative:  Sara [name] is a 83 y.o. female with medical history significant of hypertension, hyperlipidemia, type 2 diabetes mellitus off meds recently, and CKD 3B with multiple recent admissions in the setting of AKI with questionable presentation of CVA versus UTI previously.  Family reports patient has extremely poor p.o. intake at home and continues to become dehydrated and weak due to malnutrition and dehydration, at which time they bring her to the hospital for IV fluids and supportive care.  Family reports questionable difficulty swallowing for 2 months which patient declines.   Assessment & Plan:   Active Problems:   Dysphagia   Hyponatremia   AKI (acute kidney injury) (HCC)   Hyperkalemia   AKI on CKD 3B - Continue IV fluids, creatinine improving with supportive care - Likely prerenal in setting of poor p.o. intake, see below - Renal ultrasound without acute findings  Failure to thrive Poor p.o. intake Rule out dysphagia - Weight loss appears to be secondary to poor p.o. intake - Patient declines any dysphagia or odynophagia, family reports concern that she is unable to swallow food or medications like she used to - Speech evaluation cleared patient for diet, recommending barium swallow when available  Hypotension - likely from dehydration   Hyperkalemia, hyponatremia, metabolic acidosis - Continue IVF/supportive care    IDDM2 Well-controlled A1c 6.3  Continue sliding scale Not currently on medications at home likely in the setting of poor p.o. intake and risk of hypoglycemia  Anemia of chronic disease Hgb at baseline Yeast infection Dilufcan 150mg  x1  DVT prophylaxis: heparin  injection 5,000 Units Start: 04/24/24 2200 Code Status:   Code Status: Full Code Family Communication: Family at bedside  Status is: Inpatient  Dispo: The patient  is from: Home              Anticipated d/c is to: Home              Anticipated d/c date is: 24 to 48 hours              Patient currently not medically stable for discharge  Consultants:  Speech  Procedures:  Barium swallow pending  Antimicrobials:  Diflucan x 1  Subjective: No acute issues or events overnight denies nausea vomiting diarrhea constipation any fevers chills or chest pain  Objective: Vitals:   04/24/24 1848 04/24/24 2250 04/25/24 0247 04/25/24 0529  BP: 130/80 122/61 112/68 111/64  Pulse: 86 89 95 95  Resp: 20 18 18 18   Temp: 97.6 F (36.4 C) 97.9 F (36.6 C) 98.7 F (37.1 C) 98.5 F (36.9 C)  TempSrc: Oral Oral Oral Oral  SpO2: 100% 100% 100% 100%    Intake/Output Summary (Last 24 hours) at 04/25/2024 9261 Last data filed at 04/25/2024 0500 Gross per 24 hour  Intake 862.82 ml  Output --  Net 862.82 ml   There were no vitals filed for this visit.  Examination:  General:  Pleasantly resting in bed, No acute distress. HEENT:  Normocephalic atraumatic.  Sclerae nonicteric, noninjected.  Extraocular movements intact bilaterally. Neck:  Without mass or deformity.  Trachea is midline. Lungs:  Clear to auscultate bilaterally without rhonchi, wheeze, or rales. Heart:  Regular rate and rhythm.  Without murmurs, rubs, or gallops. Abdomen:  Soft, nontender, nondistended.  Without guarding or rebound. Extremities: Without cyanosis, clubbing, edema, or obvious deformity. Skin:  Warm and dry, no  erythema.   Data Reviewed: I have personally reviewed following labs and imaging studies  CBC: Recent Labs  Lab 04/24/24 1356  WBC 7.0  NEUTROABS 5.2  HGB 9.0*  HCT 29.8*  MCV 89.2  PLT 313   Basic Metabolic Panel: Recent Labs  Lab 04/24/24 1508 04/24/24 1618  NA 133* 132*  K 5.7* 5.2*  CL 104 105  CO2 17* 18*  GLUCOSE 93 89  BUN 68* 76*  CREATININE 2.83* 2.65*  CALCIUM  9.5 10.0   GFR: CrCl cannot be calculated (Unknown ideal weight.). Liver  Function Tests: Recent Labs  Lab 04/24/24 1508 04/24/24 1618  AST 20 19  ALT 11 12  ALKPHOS 37* 44  BILITOT 0.7 0.8  PROT 6.2* 7.1  ALBUMIN 2.6* 3.0*   Sepsis Labs: Recent Labs  Lab 04/24/24 1727  LATICACIDVEN 1.0    Recent Results (from the past 240 hours)  Culture, blood (Routine X 2) w Reflex to ID Panel     Status: None (Preliminary result)   Collection Time: 04/24/24  5:18 PM   Specimen: BLOOD LEFT FOREARM  Result Value Ref Range Status   Specimen Description   Final    BLOOD LEFT FOREARM Performed at [facility], [address], [place], [place] [address]    Special Requests   Final    Blood Culture results may not be optimal due to an inadequate volume of blood received in culture bottles Performed at [facility], [address] [place], [place] [address]    Culture PENDING  Incomplete   Report Status PENDING  Incomplete         Radiology Studies: US  RENAL Result Date: 04/24/2024 CLINICAL DATA:  Acute kidney injury. EXAM: RENAL / URINARY TRACT ULTRASOUND COMPLETE COMPARISON:  None Available. FINDINGS: Right Kidney: Renal measurements: 9.0 cm x 4.2 cm x 3.6 cm = volume: 71.24 mL. Diffusely increased echogenicity of the renal parenchyma is seen. A right-sided extrarenal pelvis is noted. No mass or hydronephrosis visualized. Left Kidney: Renal measurements: 8.1 cm x 5.0 cm x 3.5 cm = volume: 73.51 mL. Diffusely increased echogenicity of the renal parenchyma is seen. No mass or hydronephrosis visualized. Bladder: Posterior urinary bladder wall thickening is seen. Other: None. IMPRESSION: 1. Bilateral echogenic kidneys which may represent sequelae associated with medical renal disease. 2. Posterior urinary bladder wall thickening which may be secondary to acute cystitis. Correlation with urinalysis and nonemergent follow-up abdomen and pelvis CT is recommended to further exclude the presence of an underlying neoplastic process. Electronically  Signed   By: Suzen Dials M.D.   On: 04/24/2024 18:35        Scheduled Meds:  atorvastatin   40 mg Oral QHS   feeding supplement  1 Container Oral TID BM   fluconazole  150 mg Oral Once   heparin   5,000 Units Subcutaneous Q8H   sodium bicarbonate  650 mg Oral BID   Continuous Infusions:  sodium chloride  75 mL/hr at 04/25/24 0009     LOS: 1 day   Time spent:  Elsie JAYSON Montclair, DO [facility]  If 7PM-7AM, please contact night-coverage [email or web address]  04/25/2024, 7:38 AM

## 2024-04-25 NOTE — Progress Notes (Signed)
 Mobility Specialist - Progress Note   04/25/24 1415  Mobility  Activity Ambulated with assistance in hallway  Level of Assistance Modified independent, requires aide device or extra time  Assistive Device Front wheel walker  Distance Ambulated (ft) 160 ft  Activity Response Tolerated well  Mobility Referral Yes  Mobility visit 1 Mobility  Mobility Specialist Start Time (ACUTE ONLY) 1401  Mobility Specialist Stop Time (ACUTE ONLY) 1415  Mobility Specialist Time Calculation (min) (ACUTE ONLY) 14 min   Pt received in bed and agreeable to mobility. No complaints during session. Pt to EOB after session with all needs met.    [facility]

## 2024-04-25 NOTE — Evaluation (Signed)
 Clinical/Bedside Swallow Evaluation Patient Details  Name: Sara [name] MRN: [id number] Date of Birth: 08-03-1941  Today's Date: 04/25/2024 Time: SLP Start Time (ACUTE ONLY): 0933 SLP Stop Time (ACUTE ONLY): 0945 SLP Time Calculation (min) (ACUTE ONLY): 12 min  Past Medical History:  Past Medical History:  Diagnosis Date   Allergy    Dizziness 02/21/2018   Estrogen deficiency 01/13/2018   Need for prophylactic vaccination against Streptococcus pneumoniae (pneumococcus) 01/13/2018   Normocytic anemia 04/11/2024   Right knee pain 05/29/2019   Routine general medical examination at a health care facility 01/13/2018   Seasonal allergies 06/21/2020   Vaginal itching 06/21/2020   Vision changes 01/13/2018   Past Surgical History:  Past Surgical History:  Procedure Laterality Date   DG THUMB RIGHT HAND ([facility] HX) Right    EYE SURGERY     IR GASTROSTOMY TUBE MOD SED  09/05/2022   IR GASTROSTOMY TUBE REMOVAL  12/04/2022   TUBAL LIGATION     HPI:  Patient is an 83 y.o. female with PMH: dysphagia with h/o PEG, HTN, HLD, DM-2, CKD 3B with recent admission 5/29 and 04/10/24 for initial concern of CVA; negative workup but positive for AKI and UTI. She presented to the hospital due to hypotension at her doctor's office. In addition, family reports swallowing difficulties since May 2025, having poor appetite, ongoing weight loss. Patient herself reported intermittent trouble with solid foods and intermittent regurgitation of food.    Assessment / Plan / Recommendation  Clinical Impression  Patient is not currently presenting with clinical s/s of dysphagia as per this bedside swallow evaluation, however she does have h/o pharyngeal phase dysphagia (seen on MBS in 2023). Patient told SLP that she has occasional difficulty swallowing food if she hasn't chewed it up well enough but otherwise, she has no complaints. SLP assessed her swallow via sips of thin liquids with her swallow initiation appearing timely  and no overt s/s aspiration observed. Patient is to get a barium esophagram next date and so SLP will f/u after it has been completed to determine if any need for further evaluation of oropharyngeal swallow. SLP Visit Diagnosis: Dysphagia, unspecified (R13.10)    Aspiration Risk  Mild aspiration risk    Diet Recommendation Regular;Thin liquid    Liquid Administration via: Cup;Straw Medication Administration: Other (Comment) (as tolerated) Supervision: Patient able to self feed Compensations: Slow rate;Small sips/bites Postural Changes: Seated upright at 90 degrees;Remain upright for at least 30 minutes after po intake    Other  Recommendations Oral Care Recommendations: Oral care BID     Assistance Recommended at Discharge    Functional Status Assessment Patient has had a recent decline in their functional status and demonstrates the ability to make significant improvements in function in a reasonable and predictable amount of time.  Frequency and Duration min 1 x/week  1 week       Prognosis Prognosis for improved oropharyngeal function: Good      Swallow Study   General Date of Onset: 04/25/24 HPI: Patient is an 83 y.o. female with PMH: dysphagia with h/o PEG, HTN, HLD, DM-2, CKD 3B with recent admission 5/29 and 04/10/24 for initial concern of CVA; negative workup but positive for AKI and UTI. She presented to the hospital due to hypotension at her doctor's office. In addition, family reports swallowing difficulties since May 2025, having poor appetite, ongoing weight loss. Patient herself reported intermittent trouble with solid foods and intermittent regurgitation of food. Type of Study: Bedside Swallow Evaluation Previous Swallow  Assessment: BSE May 2025, MBS 2023 Diet Prior to this Study: Regular;Thin liquids (Level 0) Temperature Spikes Noted: No Respiratory Status: Room air History of Recent Intubation: No Behavior/Cognition: Alert;Cooperative;Pleasant mood Oral Cavity  Assessment: Within Functional Limits Oral Care Completed by SLP: No Oral Cavity - Dentition: Dentures, top Vision: Functional for self-feeding Self-Feeding Abilities: Able to feed self Patient Positioning: Upright in bed Baseline Vocal Quality: Normal Volitional Cough: Strong Volitional Swallow: Able to elicit    Oral/Motor/Sensory Function Overall Oral Motor/Sensory Function: Within functional limits   Ice Chips     Thin Liquid Thin Liquid: Within functional limits Presentation: Self Fed;Straw    Nectar Thick     Honey Thick     Puree Puree: Not tested   Solid     Solid: Not tested      Norleen IVAR Blase, MA, CCC-SLP Speech Therapy

## 2024-04-26 DIAGNOSIS — N179 Acute kidney failure, unspecified: Secondary | ICD-10-CM | POA: Diagnosis not present

## 2024-04-26 DIAGNOSIS — R1310 Dysphagia, unspecified: Secondary | ICD-10-CM | POA: Diagnosis not present

## 2024-04-26 DIAGNOSIS — E871 Hypo-osmolality and hyponatremia: Secondary | ICD-10-CM | POA: Diagnosis not present

## 2024-04-26 DIAGNOSIS — E875 Hyperkalemia: Secondary | ICD-10-CM | POA: Diagnosis not present

## 2024-04-26 LAB — BASIC METABOLIC PANEL WITH GFR
Anion gap: 7 (ref 5–15)
BUN: 48 mg/dL — ABNORMAL HIGH (ref 8–23)
CO2: 19 mmol/L — ABNORMAL LOW (ref 22–32)
Calcium: 9.5 mg/dL (ref 8.9–10.3)
Chloride: 108 mmol/L (ref 98–111)
Creatinine, Ser: 1.88 mg/dL — ABNORMAL HIGH (ref 0.44–1.00)
GFR, Estimated: 26 mL/min — ABNORMAL LOW (ref >60)
Glucose, Bld: 95 mg/dL (ref 70–99)
Potassium: 4.5 mmol/L (ref 3.5–5.1)
Sodium: 134 mmol/L — ABNORMAL LOW (ref 135–145)

## 2024-04-26 LAB — CBC
HCT: 29.3 % — ABNORMAL LOW (ref 36.0–46.0)
Hemoglobin: 8.8 g/dL — ABNORMAL LOW (ref 12.0–15.0)
MCH: 27.3 pg (ref 26.0–34.0)
MCHC: 30 g/dL (ref 30.0–36.0)
MCV: 91 fL (ref 80.0–100.0)
Platelets: 324 10*3/uL (ref 150–400)
RBC: 3.22 MIL/uL — ABNORMAL LOW (ref 3.87–5.11)
RDW: 19 % — ABNORMAL HIGH (ref 11.5–15.5)
WBC: 7.3 10*3/uL (ref 4.0–10.5)
nRBC: 0 % (ref 0.0–0.2)

## 2024-04-26 NOTE — Progress Notes (Signed)
Patient discharged home, IV removed, discharge paperwork provided and explained to patient as well as patient's daughter, both patient and patient's daughter verbalized understanding.

## 2024-04-26 NOTE — Discharge Summary (Signed)
 Physician Discharge Summary  Sara [name] FMW:[id number] DOB: Oct 30, 1940 DOA: 04/24/2024  PCP: Campbell Reynolds, NP  Admit date: 04/24/2024 Discharge date: 04/26/2024  Admitted From: Home Disposition: Home  Recommendations for Outpatient Follow-up:  Follow up with PCP in 1-2 weeks Follow-up for repeat swallow evaluation as discussed  Home Health: None Equipment/Devices: None  Discharge Condition: Stable CODE STATUS: Full Diet recommendation: Low residual diet as tolerated  Brief/Interim Summary: Sara [name] is a 83 y.o. female with medical history significant of hypertension, hyperlipidemia, type 2 diabetes mellitus off meds recently, and CKD 3B with multiple recent admissions in the setting of AKI with questionable presentation of CVA versus UTI previously.  Family reports patient has extremely poor p.o. intake at home and continues to become dehydrated and weak due to malnutrition and dehydration, at which time they bring her to the hospital for IV fluids and supportive care.  Family reports questionable difficulty swallowing for 2 months which patient declines.  Patient admitted as above with acute kidney injury on CKD 3B in the setting of poor p.o. intake and dehydration.  This is her third visit in the past few months with similar presentation, family would like further follow-up on possible dysphagia, although patient declines any symptoms of this, to ensure no mechanical issue causing patient to limit her p.o. intake.  Otherwise while here her diet was advanced without difficulty, tolerating p.o food liquids and medication without difficulty.  Otherwise stable and agreeable for discharge home.    Discharge Diagnoses:  Active Problems:   Dysphagia   Hyponatremia   AKI (acute kidney injury) (HCC)   Hyperkalemia  AKI on CKD 3B - resolved - Likely prerenal in setting of poor p.o. intake, see below - Renal ultrasound without acute findings   Failure to thrive Poor p.o.  intake Rule out dysphagia - Weight loss appears to be secondary to poor p.o. intake - Patient declines any dysphagia or odynophagia, family reports concern that she is unable to swallow food or medications like she used to - Speech evaluation cleared patient for diet, follow-up outpatient for barium swallow   Hypotension - likely from dehydration   Hyperkalemia, hyponatremia, metabolic acidosis - Continue IVF/supportive care     IDDM2 Well-controlled A1c 6.3  Continue sliding scale Not currently on medications at home likely in the setting of poor p.o. intake and risk of hypoglycemia    Discharge Instructions  Discharge Instructions     Call MD for:  difficulty breathing, headache or visual disturbances   Complete by: As directed    Call MD for:  extreme fatigue   Complete by: As directed    Call MD for:  hives   Complete by: As directed    Call MD for:  persistant dizziness or light-headedness   Complete by: As directed    Call MD for:  persistant nausea and vomiting   Complete by: As directed    Call MD for:  severe uncontrolled pain   Complete by: As directed    Call MD for:  temperature >100.4   Complete by: As directed    Diet - low sodium heart healthy   Complete by: As directed    Increase activity slowly   Complete by: As directed       Allergies as of 04/26/2024   No Known Allergies      Medication List     STOP taking these medications    fluconazole 150 MG tablet Commonly known as: DIFLUCAN   nystatin 100000  UNIT/ML suspension Commonly known as: MYCOSTATIN   pantoprazole 20 MG tablet Commonly known as: PROTONIX       TAKE these medications    Accu-Chek Guide test strip Generic drug: glucose blood USE 1  4 TIMES DAILY   Accu-Chek Softclix Lancets lancets Please use to check blood sugar up to 4 times daily.   atorvastatin  40 MG tablet Commonly known as: LIPITOR Place 1 tablet (40 mg total) into feeding tube daily. What changed:   how to take this when to take this   blood glucose meter kit and supplies Kit Dispense based on patient and insurance preference. Use up to four times daily as directed.   cetirizine  10 MG tablet Commonly known as: ZYRTEC  Take 1 tablet (10 mg total) by mouth daily as needed for allergies.   esomeprazole  10 MG packet Commonly known as: NEXIUM  Take 10 mg by mouth daily before breakfast.   folic acid  1 MG tablet Commonly known as: FOLVITE  Take 1 tablet (1 mg total) by mouth daily. What changed: when to take this   hydrochlorothiazide  25 MG tablet Commonly known as: HYDRODIURIL  Take 25 mg by mouth daily.   ketorolac 0.4 % Soln Commonly known as: ACULAR Place 1 drop into the left eye 4 (four) times daily.   megestrol 40 MG/ML suspension Commonly known as: MEGACE Take by mouth.   mirtazapine  15 MG tablet Commonly known as: REMERON  Take 15 mg by mouth at bedtime.   moxifloxacin 0.5 % ophthalmic solution Commonly known as: VIGAMOX Place 1 drop into the left eye 3 (three) times daily.   Pen Needles 3/16 31G X 5 MM Misc Use as instructed to inject insulin  once daily        No Known Allergies  Consultations: None  Procedures/Studies: US  RENAL Result Date: 04/24/2024 CLINICAL DATA:  Acute kidney injury. EXAM: RENAL / URINARY TRACT ULTRASOUND COMPLETE COMPARISON:  None Available. FINDINGS: Right Kidney: Renal measurements: 9.0 cm x 4.2 cm x 3.6 cm = volume: 71.24 mL. Diffusely increased echogenicity of the renal parenchyma is seen. A right-sided extrarenal pelvis is noted. No mass or hydronephrosis visualized. Left Kidney: Renal measurements: 8.1 cm x 5.0 cm x 3.5 cm = volume: 73.51 mL. Diffusely increased echogenicity of the renal parenchyma is seen. No mass or hydronephrosis visualized. Bladder: Posterior urinary bladder wall thickening is seen. Other: None. IMPRESSION: 1. Bilateral echogenic kidneys which may represent sequelae associated with medical renal disease. 2.  Posterior urinary bladder wall thickening which may be secondary to acute cystitis. Correlation with urinalysis and nonemergent follow-up abdomen and pelvis CT is recommended to further exclude the presence of an underlying neoplastic process. Electronically Signed   By: Suzen Dials M.D.   On: 04/24/2024 18:35   ECHOCARDIOGRAM COMPLETE Result Date: 04/11/2024    ECHOCARDIOGRAM REPORT   Patient Name:   TASHICA PROVENCIO Date of Exam: 04/11/2024 Medical Rec #:  [id number]         Height:       61.0 in Accession #:    [id number]        Weight:       91.0 lb Date of Birth:  December 28, 1940         BSA:          1.352 m Patient Age:    83 years          BP:           122/59 mmHg Patient Gender: F  HR:           107 bpm. Exam Location:  Inpatient Procedure: 2D Echo (Both Spectral and Color Flow Doppler were utilized during            procedure). Indications:    abnormal ecg  History:        Patient has no prior history of Echocardiogram examinations.                 Chronic kidney disease, Signs/Symptoms:Murmur; Risk                 Factors:Diabetes, Dyslipidemia and Hypertension.  Sonographer:    Tinnie Barefoot RDCS Referring Phys: [phone] STEPHEN K CHIU IMPRESSIONS  1. Left ventricular ejection fraction, by estimation, is 70 to 75%. The left ventricle has hyperdynamic function. The left ventricle has no regional wall motion abnormalities. Left ventricular diastolic parameters are consistent with Grade I diastolic dysfunction (impaired relaxation).  2. Right ventricular systolic function is hyperdynamic. The right ventricular size is normal. There is normal pulmonary artery systolic pressure.  3. The mitral valve was not well visualized. No evidence of mitral valve regurgitation. No evidence of mitral stenosis. Moderate mitral annular calcification.  4. The aortic valve was not well visualized. Aortic valve regurgitation is not visualized.  5. The inferior vena cava is normal in size with greater than  50% respiratory variability, suggesting right atrial pressure of 3 mmHg. Comparison(s): No prior Echocardiogram. FINDINGS  Left Ventricle: Intracavitary gradient noted related to hyperdynamic function. Left ventricular ejection fraction, by estimation, is 70 to 75%. The left ventricle has hyperdynamic function. The left ventricle has no regional wall motion abnormalities. The left ventricular internal cavity size was normal in size. There is no left ventricular hypertrophy. Left ventricular diastolic parameters are consistent with Grade I diastolic dysfunction (impaired relaxation). Right Ventricle: The right ventricular size is normal. No increase in right ventricular wall thickness. Right ventricular systolic function is hyperdynamic. There is normal pulmonary artery systolic pressure. The tricuspid regurgitant velocity is 2.69 m/s, and with an assumed right atrial pressure of 3 mmHg, the estimated right ventricular systolic pressure is 31.9 mmHg. Left Atrium: Left atrial size was normal in size. Right Atrium: Right atrial size was normal in size. Pericardium: There is no evidence of pericardial effusion. Mitral Valve: Posterior prolapse suspected. The mitral valve was not well visualized. Moderate mitral annular calcification. No evidence of mitral valve regurgitation. No evidence of mitral valve stenosis. Tricuspid Valve: The tricuspid valve is normal in structure. Tricuspid valve regurgitation is not demonstrated. No evidence of tricuspid stenosis. Aortic Valve: The aortic valve was not well visualized. Aortic valve regurgitation is not visualized. Pulmonic Valve: The pulmonic valve was not well visualized. Pulmonic valve regurgitation is not visualized. No evidence of pulmonic stenosis. Aorta: The aortic root, ascending aorta and aortic arch are all structurally normal, with no evidence of dilitation or obstruction. Venous: The inferior vena cava is normal in size with greater than 50% respiratory variability,  suggesting right atrial pressure of 3 mmHg. IAS/Shunts: The atrial septum is grossly normal.  LEFT VENTRICLE PLAX 2D LVIDd:         3.00 cm   Diastology LVIDs:         1.80 cm   LV e' medial:    7.29 cm/s LV PW:         0.70 cm   LV E/e' medial:  11.1 LV IVS:        0.70 cm   LV e'  lateral:   9.57 cm/s LVOT diam:     1.50 cm   LV E/e' lateral: 8.5 LV SV:         42 LV SV Index:   31 LVOT Area:     1.77 cm  RIGHT VENTRICLE RV Basal diam:  2.30 cm RV S prime:     12.60 cm/s TAPSE (M-mode): 2.0 cm LEFT ATRIUM             Index        RIGHT ATRIUM          Index LA diam:        2.40 cm 1.78 cm/m   RA Area:     7.90 cm LA Vol (A2C):   21.4 ml 15.83 ml/m  RA Volume:   15.00 ml 11.10 ml/m LA Vol (A4C):   17.5 ml 12.95 ml/m LA Biplane Vol: 19.8 ml 14.65 ml/m  AORTIC VALVE LVOT Vmax:   150.00 cm/s LVOT Vmean:  99.400 cm/s LVOT VTI:    0.236 m  AORTA Ao Root diam: 2.70 cm MITRAL VALVE                TRICUSPID VALVE MV Area (PHT): 4.21 cm     TR Peak grad:   28.9 mmHg MV Decel Time: 180 msec     TR Vmax:        269.00 cm/s MV E velocity: 81.20 cm/s MV A velocity: 120.00 cm/s  SHUNTS MV E/A ratio:  0.68         Systemic VTI:  0.24 m                             Systemic Diam: 1.50 cm Stanly Leavens MD Electronically signed by Stanly Leavens MD Signature Date/Time: 04/11/2024/4:01:50 PM    Final    DG Chest Portable 1 View Result Date: 04/10/2024 CLINICAL DATA:  Right knee weakness shoulder pain EXAM: PORTABLE CHEST 1 VIEW COMPARISON:  03/26/2024 FINDINGS: The heart size and mediastinal contours are within normal limits. Aortic atherosclerosis. Both lungs are clear. The visualized skeletal structures are unremarkable. IMPRESSION: No active disease. Electronically Signed   By: Luke Bun M.D.   On: 04/10/2024 20:10   MR LUMBAR SPINE WO CONTRAST Result Date: 03/28/2024 CLINICAL DATA:  83 year old female with bilateral lower extremity weakness. EXAM: MRI LUMBAR SPINE WITHOUT CONTRAST TECHNIQUE:  Multiplanar, multisequence MR imaging of the lumbar spine was performed. No intravenous contrast was administered. COMPARISON:  Thoracic MRI today reported separately. CT abdomen 09/03/2022. FINDINGS: Segmentation:  Normal, concordant with the thoracic numbering today. Alignment: Chronic straightening of lumbar lordosis superimposed on mild to moderate dextroconvex lumbar scoliosis. Subtle chronic retrolisthesis of L2 on L3, anterolisthesis of L4 on L5. Vertebrae: Maintained vertebral body height. Chronic degenerative endplate marrow sparing and signal changes at L2-L3 and L4-L5. Vacuum disc at those levels on the prior CT. Intact visible sacrum and SI joints. No marrow edema or evidence of acute osseous abnormality. Conus medullaris and cauda equina: Conus extends to the T12-L1 level. No lower spinal cord or conus signal abnormality. Generally normal cauda equina nerve roots. Paraspinal and other soft tissues: Trabeculated urinary bladder wall thickening (series 9, image 40), mildly to moderately distended bladder. Negative visible abdominal viscera. Some lower lumbar erector spinae muscle, paraspinal muscle atrophy. Disc levels: L1-L2:  Negative. L2-L3: Chronic severe disc space loss, mild spondylolisthesis. Leftward circumferential disc osteophyte complex. Moderate left lateral recess stenosis (left L3 nerve  level). Severe left L2 foraminal stenosis (series 6, image 11). No spinal stenosis. L3-L4: Mild circumferential disc bulge. Mild facet and ligament flavum hypertrophy. No significant stenosis. L4-L5: Severe disc space loss. Mild anterolisthesis. Circumferential disc osteophyte complex asymmetric to the right. Moderate to severe ligament flavum, moderate right greater than left facet hypertrophy. Severe right lateral recess stenosis (right L5 nerve level series 9, image 26). Severe right L4 neural foraminal stenosis (series 6, image 6). No significant spinal stenosis. L5-S1:  Negative. IMPRESSION: 1.  Chronically advanced lumbar spine degeneration at L2-L3 and L4-L5 in the setting of mild spondylolisthesis, dextroconvex lumbar scoliosis. No significant lumbar spinal stenosis but severe neural foraminal and/or lateral recess stenosis at the left L2, left L3, right L4 and right L5 nerve levels. 2. Very mild for age lumbar spine degeneration elsewhere. 3. Trabeculated urinary bladder wall thickening, query chronic Urinary retention or bladder outlet obstruction. Electronically Signed   By: VEAR Hurst M.D.   On: 03/28/2024 13:18   MR THORACIC SPINE WO CONTRAST Result Date: 03/28/2024 CLINICAL DATA:  83 year old female with bilateral lower extremity weakness. EXAM: MRI THORACIC SPINE WITHOUT CONTRAST TECHNIQUE: Multiplanar, multisequence MR imaging of the thoracic spine was performed. No intravenous contrast was administered. COMPARISON:  Chest CT 09/24/2022. Brain MRI 03/26/2024. FINDINGS: Limited cervical spine imaging: Partially visible C6-C7 disc degeneration, C7-T1 facet hypertrophy. Thoracic spine segmentation:  Normal on the comparison CT. Alignment: Stable thoracic kyphosis since 2023. No significant scoliosis or spondylolisthesis. Vertebrae: Normal background bone marrow signal. Maintained thoracic vertebral height. No marrow edema or evidence of acute osseous abnormality. Cord: Normal. Normal conus medullaris at T12-L1. Fairly capacious underlying thoracic spinal canal. Paraspinal and other soft tissues: Trace layering pleural effusions (series 33 image 27). Otherwise negative visible chest and upper abdominal viscera. Negative visualized posterior paraspinal soft tissues. Disc levels: Very mild for age thoracic spine degeneration. No disc herniation. No spinal stenosis. No significant thoracic foraminal stenosis. IMPRESSION: 1. Normal for age MRI appearance of the thoracic spine. Normal thoracic spinal cord. 2. Trace layering pleural effusions. Electronically Signed   By: VEAR Hurst M.D.   On: 03/28/2024 13:13      Subjective: No acute issues or events overnight   Discharge Exam: Vitals:   04/25/24 2036 04/26/24 0447  BP: 109/68 120/65  Pulse: (!) 106 89  Resp: 18 16  Temp: 98.7 F (37.1 C) 98.6 F (37 C)  SpO2: 100% 100%   Vitals:   04/25/24 1650 04/25/24 2036 04/26/24 0447 04/26/24 0714  BP: (!) 109/57 109/68 120/65   Pulse: 83 (!) 106 89   Resp: 19 18 16    Temp: 98 F (36.7 C) 98.7 F (37.1 C) 98.6 F (37 C)   TempSrc:  Oral Oral   SpO2: 100% 100% 100%   Weight:    41.4 kg  Height:    5' 1 (1.549 m)    General: Pt is alert, awake, not in acute distress Cardiovascular: RRR, S1/S2 +, no rubs, no gallops Respiratory: CTA bilaterally, no wheezing, no rhonchi Abdominal: Soft, NT, ND, bowel sounds + Extremities: no edema, no cyanosis    The results of significant diagnostics from this hospitalization (including imaging, microbiology, ancillary and laboratory) are listed below for reference.     Microbiology: Recent Results (from the past 240 hours)  Culture, blood (Routine X 2) w Reflex to ID Panel     Status: None (Preliminary result)   Collection Time: 04/24/24  5:18 PM   Specimen: BLOOD LEFT FOREARM  Result Value Ref  Range Status   Specimen Description   Final    BLOOD LEFT FOREARM Performed at [facility], [address] [place], [place] [address]    Special Requests   Final    Blood Culture results may not be optimal due to an inadequate volume of blood received in culture bottles Performed at [facility], [address] [place], [place] [address]    Culture   Final    NO GROWTH 2 DAYS Performed at [facility], [address] [place], [place] [address]    Report Status PENDING  Incomplete  Culture, blood (Routine X 2) w Reflex to ID Panel     Status: None (Preliminary result)   Collection Time: 04/24/24  8:39 PM   Specimen: BLOOD RIGHT ARM  Result Value Ref Range Status   Specimen Description   Final    BLOOD RIGHT  ARM Performed at [facility], [address] [place], [place] [address]    Special Requests   Final    BOTTLES DRAWN AEROBIC AND ANAEROBIC Blood Culture adequate volume Performed at [facility], [address] [place], [place] [address]    Culture   Final    NO GROWTH 2 DAYS Performed at [facility], [address] [place], [place] [address]    Report Status PENDING  Incomplete     Labs: BNP (last 3 results) No results for input(s): BNP in the last 8760 hours. Basic Metabolic Panel: Recent Labs  Lab 04/24/24 1508 04/24/24 1618 04/25/24 0806 04/26/24 0605  NA 133* 132* 136 134*  K 5.7* 5.2* 4.8 4.5  CL 104 105 105 108  CO2 17* 18* 21* 19*  GLUCOSE 93 89 99 95  BUN 68* 76* 67* 48*  CREATININE 2.83* 2.65* 2.70* 1.88*  CALCIUM  9.5 10.0 10.5* 9.5  MG  --   --  2.7*  --   PHOS  --   --  3.1  --    Liver Function Tests: Recent Labs  Lab 04/24/24 1508 04/24/24 1618 04/25/24 0806  AST 20 19 19   ALT 11 12 13   ALKPHOS 37* 44 49  BILITOT 0.7 0.8 0.6  PROT 6.2* 7.1 7.9  ALBUMIN 2.6* 3.0* 3.3*   No results for input(s): LIPASE, AMYLASE in the last 168 hours. No results for input(s): AMMONIA in the last 168 hours. CBC: Recent Labs  Lab 04/24/24 1356 04/25/24 0806 04/26/24 0605  WBC 7.0 7.3 7.3  NEUTROABS 5.2  --   --   HGB 9.0* 9.4* 8.8*  HCT 29.8* 31.0* 29.3*  MCV 89.2 87.1 91.0  PLT 313 405* 324   Cardiac Enzymes: No results for input(s): CKTOTAL, CKMB, CKMBINDEX, TROPONINI in the last 168 hours. BNP: Invalid input(s): POCBNP CBG: No results for input(s): GLUCAP in the last 168 hours. D-Dimer No results for input(s): DDIMER in the last 72 hours. Hgb A1c No results for input(s): HGBA1C in the last 72 hours. Lipid Profile No results for input(s): CHOL, HDL, LDLCALC, TRIG, CHOLHDL, LDLDIRECT in the last 72 hours. Thyroid function studies No results for input(s): TSH, T4TOTAL,  T3FREE, THYROIDAB in the last 72 hours.  Invalid input(s): FREET3 Anemia work up No results for input(s): VITAMINB12, FOLATE, FERRITIN, TIBC, IRON, RETICCTPCT in the last 72 hours. Urinalysis    Component Value Date/Time   COLORURINE STRAW (A) 04/10/2024 2025   APPEARANCEUR HAZY (A) 04/10/2024 2025   LABSPEC 1.020 04/10/2024 2025   PHURINE 6.5  04/10/2024 2025   GLUCOSEU NEGATIVE 04/10/2024 2025   HGBUR LARGE (A) 04/10/2024 2025   BILIRUBINUR NEGATIVE 04/10/2024 2025   BILIRUBINUR negative 06/01/2022 1010   KETONESUR 15 (A) 04/10/2024 2025   PROTEINUR >=300 (A) 04/10/2024 2025   UROBILINOGEN 0.2 06/01/2022 1010   NITRITE NEGATIVE 04/10/2024 2025   LEUKOCYTESUR MODERATE (A) 04/10/2024 2025   Sepsis Labs Recent Labs  Lab 04/24/24 1356 04/25/24 0806 04/26/24 0605  WBC 7.0 7.3 7.3   Microbiology Recent Results (from the past 240 hours)  Culture, blood (Routine X 2) w Reflex to ID Panel     Status: None (Preliminary result)   Collection Time: 04/24/24  5:18 PM   Specimen: BLOOD LEFT FOREARM  Result Value Ref Range Status   Specimen Description   Final    BLOOD LEFT FOREARM Performed at [facility], [address], [place], [place] [address]    Special Requests   Final    Blood Culture results may not be optimal due to an inadequate volume of blood received in culture bottles Performed at [name], [address] [place], [place] [address]    Culture   Final    NO GROWTH 2 DAYS Performed at [facility], [address], [place], [place] [address]    Report Status PENDING  Incomplete  Culture, blood (Routine X 2) w Reflex to ID Panel     Status: None (Preliminary result)   Collection Time: 04/24/24  8:39 PM   Specimen: BLOOD RIGHT ARM  Result Value Ref Range Status   Specimen Description   Final    BLOOD RIGHT ARM Performed at [facility], [address] [place], [place] [address]    Special Requests    Final    BOTTLES DRAWN AEROBIC AND ANAEROBIC Blood Culture adequate volume Performed at [facility], [address], [place], [place] [address]    Culture   Final    NO GROWTH 2 DAYS Performed at [facility], [address] [place], [place] [address]    Report Status PENDING  Incomplete     Time coordinating discharge: Over 30 minutes  SIGNED:   Elsie JAYSON Montclair, DO [facility] 04/26/2024, 12:42 PM Pager   If 7PM-7AM, please contact night-coverage [email or web address]

## 2024-04-26 NOTE — Plan of Care (Signed)

## 2024-04-29 LAB — CULTURE, BLOOD (ROUTINE X 2)
Culture: NO GROWTH
Culture: NO GROWTH
Special Requests: ADEQUATE

## 2024-05-11 ENCOUNTER — Encounter: Payer: Self-pay | Admitting: Physician Assistant

## 2024-05-15 ENCOUNTER — Other Ambulatory Visit (HOSPITAL_COMMUNITY): Payer: Self-pay | Admitting: Gastroenterology

## 2024-05-15 DIAGNOSIS — R1310 Dysphagia, unspecified: Secondary | ICD-10-CM

## 2024-05-15 DIAGNOSIS — R059 Cough, unspecified: Secondary | ICD-10-CM

## 2024-06-03 ENCOUNTER — Ambulatory Visit (HOSPITAL_COMMUNITY)
Admission: RE | Admit: 2024-06-03 | Discharge: 2024-06-03 | Disposition: A | Discharge status: Home / Self Care | Source: Ambulatory Visit | Attending: *Deleted | Admitting: *Deleted

## 2024-06-03 ENCOUNTER — Ambulatory Visit (HOSPITAL_COMMUNITY)
Admission: RE | Admit: 2024-06-03 | Discharge: 2024-06-03 | Disposition: A | Discharge status: Home / Self Care | Source: Ambulatory Visit | Attending: Gastroenterology | Admitting: Gastroenterology

## 2024-06-03 DIAGNOSIS — N1832 Chronic kidney disease, stage 3b: Secondary | ICD-10-CM | POA: Insufficient documentation

## 2024-06-03 DIAGNOSIS — E1122 Type 2 diabetes mellitus with diabetic chronic kidney disease: Secondary | ICD-10-CM | POA: Insufficient documentation

## 2024-06-03 DIAGNOSIS — R1310 Dysphagia, unspecified: Secondary | ICD-10-CM

## 2024-06-03 DIAGNOSIS — R1313 Dysphagia, pharyngeal phase: Secondary | ICD-10-CM | POA: Diagnosis not present

## 2024-06-03 DIAGNOSIS — I130 Hypertensive heart and chronic kidney disease with heart failure and stage 1 through stage 4 chronic kidney disease, or unspecified chronic kidney disease: Secondary | ICD-10-CM | POA: Diagnosis not present

## 2024-06-03 DIAGNOSIS — E785 Hyperlipidemia, unspecified: Secondary | ICD-10-CM | POA: Insufficient documentation

## 2024-06-03 DIAGNOSIS — R059 Cough, unspecified: Secondary | ICD-10-CM

## 2024-06-03 NOTE — Evaluation (Signed)
 Modified Barium Swallow Study  Patient Details  Name: Sara [name] MRN: [id number] Date of Birth: 11/28/1940  Today's Date: 06/03/2024  Modified Barium Swallow completed.  Full report located under Chart Review in the Imaging Section.  History of Present Illness Sara [name] is an 83 y.o. female who was referred for an OP MBS. PMH: dysphagia with h/o PEG (removed 12/24/22), HTN, HLD, DM-2, CKD 3B with recent admissions 5/29, 6/13, and 04/24/24 for initial concern of CVA; negative workup but positive for AKI and UTI. Family has expressed concerns about possible dysphagia and impact on PO intake. Pt describes waxing/waning appetite. She had a remote dysphagia in Nov 2023 related to idiopathic cranial neuropathy of right hypoglossal; severity of dysphagia required PEG- has demonstrated significant improvement since this time.   Clinical Impression Pt's swallow function has improved significantly since initial Nov 2023 MBS. She demonstrates mildly prolonged oral preparation with complete recollection; trace oral residue; consistent high penetration of thin liquids (PAS 2) into the larynx with ejection upon completion of the swallow and no aspiration. DIGEST score is Grade 1, mild pharyngeal dysphagia.  Results of study were discussed and viewed in real time with pt and her son, Sara [name]. Recommend pt continue regular solids/thin liquids; pills whole with liquid or puree. No SLP f/u is needed. Factors that may increase risk of adverse event in presence of aspiration Noe & Lianne 2021):    Swallow Evaluation Recommendations Recommendations: PO diet PO Diet Recommendation: Regular;Thin liquids (Level 0) Liquid Administration via: Cup;Straw Medication Administration: Whole meds with liquid Supervision: Patient able to self-feed Oral care recommendations: Oral care BID (2x/day)   Jaydrian Corpening L. Vona, MA CCC/SLP Clinical Specialist - Acute Care SLP Acute Rehabilitation Services Office number  [phone]    Vona Palma Laurice 06/03/2024,4:02 PM

## 2024-06-14 ENCOUNTER — Other Ambulatory Visit: Payer: Self-pay

## 2024-06-14 ENCOUNTER — Emergency Department (HOSPITAL_COMMUNITY)

## 2024-06-14 ENCOUNTER — Inpatient Hospital Stay (HOSPITAL_COMMUNITY)
Admission: EM | Admit: 2024-06-14 | Discharge: 2024-06-17 | DRG: 871 | Disposition: A | Discharge status: Rehabilitation Facility | Attending: Internal Medicine | Admitting: Internal Medicine

## 2024-06-14 ENCOUNTER — Encounter (HOSPITAL_COMMUNITY): Payer: Self-pay

## 2024-06-14 DIAGNOSIS — N183 Chronic kidney disease, stage 3 unspecified: Secondary | ICD-10-CM | POA: Diagnosis not present

## 2024-06-14 DIAGNOSIS — E1141 Type 2 diabetes mellitus with diabetic mononeuropathy: Secondary | ICD-10-CM | POA: Diagnosis present

## 2024-06-14 DIAGNOSIS — E785 Hyperlipidemia, unspecified: Secondary | ICD-10-CM | POA: Diagnosis present

## 2024-06-14 DIAGNOSIS — R270 Ataxia, unspecified: Secondary | ICD-10-CM | POA: Diagnosis present

## 2024-06-14 DIAGNOSIS — A4159 Other Gram-negative sepsis: Secondary | ICD-10-CM | POA: Diagnosis present

## 2024-06-14 DIAGNOSIS — D649 Anemia, unspecified: Secondary | ICD-10-CM | POA: Diagnosis not present

## 2024-06-14 DIAGNOSIS — E1165 Type 2 diabetes mellitus with hyperglycemia: Secondary | ICD-10-CM | POA: Diagnosis present

## 2024-06-14 DIAGNOSIS — B961 Klebsiella pneumoniae [K. pneumoniae] as the cause of diseases classified elsewhere: Secondary | ICD-10-CM | POA: Diagnosis present

## 2024-06-14 DIAGNOSIS — I152 Hypertension secondary to endocrine disorders: Secondary | ICD-10-CM | POA: Diagnosis present

## 2024-06-14 DIAGNOSIS — Z8249 Family history of ischemic heart disease and other diseases of the circulatory system: Secondary | ICD-10-CM

## 2024-06-14 DIAGNOSIS — E872 Acidosis, unspecified: Secondary | ICD-10-CM | POA: Diagnosis present

## 2024-06-14 DIAGNOSIS — E11649 Type 2 diabetes mellitus with hypoglycemia without coma: Secondary | ICD-10-CM | POA: Diagnosis present

## 2024-06-14 DIAGNOSIS — R338 Other retention of urine: Secondary | ICD-10-CM | POA: Diagnosis present

## 2024-06-14 DIAGNOSIS — Z833 Family history of diabetes mellitus: Secondary | ICD-10-CM

## 2024-06-14 DIAGNOSIS — Z820 Family history of epilepsy and other diseases of the nervous system: Secondary | ICD-10-CM

## 2024-06-14 DIAGNOSIS — Z681 Body mass index (BMI) 19 or less, adult: Secondary | ICD-10-CM

## 2024-06-14 DIAGNOSIS — A419 Sepsis, unspecified organism: Secondary | ICD-10-CM | POA: Diagnosis present

## 2024-06-14 DIAGNOSIS — I69391 Dysphagia following cerebral infarction: Secondary | ICD-10-CM | POA: Diagnosis not present

## 2024-06-14 DIAGNOSIS — D509 Iron deficiency anemia, unspecified: Secondary | ICD-10-CM | POA: Diagnosis present

## 2024-06-14 DIAGNOSIS — I639 Cerebral infarction, unspecified: Secondary | ICD-10-CM | POA: Diagnosis not present

## 2024-06-14 DIAGNOSIS — E1169 Type 2 diabetes mellitus with other specified complication: Secondary | ICD-10-CM | POA: Diagnosis present

## 2024-06-14 DIAGNOSIS — E860 Dehydration: Secondary | ICD-10-CM | POA: Diagnosis present

## 2024-06-14 DIAGNOSIS — E43 Unspecified severe protein-calorie malnutrition: Secondary | ICD-10-CM | POA: Diagnosis present

## 2024-06-14 DIAGNOSIS — I6302 Cerebral infarction due to thrombosis of basilar artery: Secondary | ICD-10-CM | POA: Diagnosis not present

## 2024-06-14 DIAGNOSIS — N390 Urinary tract infection, site not specified: Secondary | ICD-10-CM | POA: Diagnosis present

## 2024-06-14 DIAGNOSIS — I6329 Cerebral infarction due to unspecified occlusion or stenosis of other precerebral arteries: Secondary | ICD-10-CM | POA: Diagnosis present

## 2024-06-14 DIAGNOSIS — G9341 Metabolic encephalopathy: Secondary | ICD-10-CM | POA: Diagnosis present

## 2024-06-14 DIAGNOSIS — R6520 Severe sepsis without septic shock: Secondary | ICD-10-CM | POA: Diagnosis present

## 2024-06-14 DIAGNOSIS — E875 Hyperkalemia: Secondary | ICD-10-CM | POA: Diagnosis present

## 2024-06-14 DIAGNOSIS — C679 Malignant neoplasm of bladder, unspecified: Secondary | ICD-10-CM

## 2024-06-14 DIAGNOSIS — N179 Acute kidney failure, unspecified: Secondary | ICD-10-CM | POA: Diagnosis present

## 2024-06-14 DIAGNOSIS — F05 Delirium due to known physiological condition: Secondary | ICD-10-CM | POA: Diagnosis present

## 2024-06-14 DIAGNOSIS — E1122 Type 2 diabetes mellitus with diabetic chronic kidney disease: Secondary | ICD-10-CM | POA: Diagnosis present

## 2024-06-14 DIAGNOSIS — Z809 Family history of malignant neoplasm, unspecified: Secondary | ICD-10-CM

## 2024-06-14 DIAGNOSIS — G8929 Other chronic pain: Secondary | ICD-10-CM | POA: Diagnosis present

## 2024-06-14 DIAGNOSIS — N1832 Chronic kidney disease, stage 3b: Secondary | ICD-10-CM | POA: Diagnosis present

## 2024-06-14 DIAGNOSIS — E1159 Type 2 diabetes mellitus with other circulatory complications: Secondary | ICD-10-CM | POA: Diagnosis present

## 2024-06-14 DIAGNOSIS — Z79899 Other long term (current) drug therapy: Secondary | ICD-10-CM

## 2024-06-14 DIAGNOSIS — Z7982 Long term (current) use of aspirin: Secondary | ICD-10-CM

## 2024-06-14 DIAGNOSIS — Z794 Long term (current) use of insulin: Secondary | ICD-10-CM

## 2024-06-14 DIAGNOSIS — Z741 Need for assistance with personal care: Secondary | ICD-10-CM | POA: Diagnosis present

## 2024-06-14 DIAGNOSIS — E8809 Other disorders of plasma-protein metabolism, not elsewhere classified: Secondary | ICD-10-CM | POA: Diagnosis present

## 2024-06-14 DIAGNOSIS — D631 Anemia in chronic kidney disease: Secondary | ICD-10-CM | POA: Diagnosis present

## 2024-06-14 DIAGNOSIS — R339 Retention of urine, unspecified: Secondary | ICD-10-CM | POA: Diagnosis not present

## 2024-06-14 DIAGNOSIS — R54 Age-related physical debility: Secondary | ICD-10-CM | POA: Diagnosis present

## 2024-06-14 DIAGNOSIS — R29700 NIHSS score 0: Secondary | ICD-10-CM | POA: Diagnosis present

## 2024-06-14 DIAGNOSIS — E1151 Type 2 diabetes mellitus with diabetic peripheral angiopathy without gangrene: Secondary | ICD-10-CM | POA: Diagnosis not present

## 2024-06-14 DIAGNOSIS — I6381 Other cerebral infarction due to occlusion or stenosis of small artery: Secondary | ICD-10-CM | POA: Diagnosis not present

## 2024-06-14 DIAGNOSIS — R1310 Dysphagia, unspecified: Secondary | ICD-10-CM | POA: Diagnosis present

## 2024-06-14 DIAGNOSIS — M25561 Pain in right knee: Secondary | ICD-10-CM | POA: Diagnosis present

## 2024-06-14 DIAGNOSIS — I6389 Other cerebral infarction: Secondary | ICD-10-CM | POA: Diagnosis not present

## 2024-06-14 LAB — COMPREHENSIVE METABOLIC PANEL WITH GFR
ALT: 31 U/L (ref 0–44)
AST: 52 U/L — ABNORMAL HIGH (ref 15–41)
Albumin: 2.8 g/dL — ABNORMAL LOW (ref 3.5–5.0)
Alkaline Phosphatase: 165 U/L — ABNORMAL HIGH (ref 38–126)
Anion gap: 18 — ABNORMAL HIGH (ref 5–15)
BUN: 135 mg/dL — ABNORMAL HIGH (ref 8–23)
CO2: 13 mmol/L — ABNORMAL LOW (ref 22–32)
Calcium: 9.1 mg/dL (ref 8.9–10.3)
Chloride: 98 mmol/L (ref 98–111)
Creatinine, Ser: 9.4 mg/dL — ABNORMAL HIGH (ref 0.44–1.00)
GFR, Estimated: 4 mL/min — ABNORMAL LOW (ref >60)
Glucose, Bld: 274 mg/dL — ABNORMAL HIGH (ref 70–99)
Potassium: 6 mmol/L — ABNORMAL HIGH (ref 3.5–5.1)
Sodium: 129 mmol/L — ABNORMAL LOW (ref 135–145)
Total Bilirubin: 1.1 mg/dL (ref 0.0–1.2)
Total Protein: 8.2 g/dL — ABNORMAL HIGH (ref 6.5–8.1)

## 2024-06-14 LAB — BASIC METABOLIC PANEL WITH GFR
Anion gap: 22 — ABNORMAL HIGH (ref 5–15)
BUN: 130 mg/dL — ABNORMAL HIGH (ref 8–23)
CO2: 13 mmol/L — ABNORMAL LOW (ref 22–32)
Calcium: 9.4 mg/dL (ref 8.9–10.3)
Chloride: 96 mmol/L — ABNORMAL LOW (ref 98–111)
Creatinine, Ser: 9.52 mg/dL — ABNORMAL HIGH (ref 0.44–1.00)
GFR, Estimated: 4 mL/min — ABNORMAL LOW (ref >60)
Glucose, Bld: 323 mg/dL — ABNORMAL HIGH (ref 70–99)
Potassium: 6.4 mmol/L (ref 3.5–5.1)
Sodium: 130 mmol/L — ABNORMAL LOW (ref 135–145)

## 2024-06-14 LAB — CBC WITH DIFFERENTIAL/PLATELET
Abs Immature Granulocytes: 0.23 K/uL — ABNORMAL HIGH (ref 0.00–0.07)
Basophils Absolute: 0.1 K/uL (ref 0.0–0.1)
Basophils Relative: 1 %
Eosinophils Absolute: 0.1 K/uL (ref 0.0–0.5)
Eosinophils Relative: 0 %
HCT: 29.5 % — ABNORMAL LOW (ref 36.0–46.0)
Hemoglobin: 10.1 g/dL — ABNORMAL LOW (ref 12.0–15.0)
Immature Granulocytes: 1 %
Lymphocytes Relative: 11 %
Lymphs Abs: 2 K/uL (ref 0.7–4.0)
MCH: 26.3 pg (ref 26.0–34.0)
MCHC: 34.2 g/dL (ref 30.0–36.0)
MCV: 76.8 fL — ABNORMAL LOW (ref 80.0–100.0)
Monocytes Absolute: 1 K/uL (ref 0.1–1.0)
Monocytes Relative: 5 %
Neutro Abs: 15.2 K/uL — ABNORMAL HIGH (ref 1.7–7.7)
Neutrophils Relative %: 82 %
Platelets: 302 K/uL (ref 150–400)
RBC: 3.84 MIL/uL — ABNORMAL LOW (ref 3.87–5.11)
RDW: 14.8 % (ref 11.5–15.5)
WBC: 18.6 K/uL — ABNORMAL HIGH (ref 4.0–10.5)
nRBC: 0 % (ref 0.0–0.2)

## 2024-06-14 LAB — I-STAT CG4 LACTIC ACID, ED
Lactic Acid, Venous: 3.9 mmol/L (ref 0.5–1.9)
Lactic Acid, Venous: 2.3 mmol/L (ref 0.5–1.9)

## 2024-06-14 LAB — CREATININE, URINE, RANDOM: Creatinine, Urine: 36 mg/dL

## 2024-06-14 LAB — SODIUM, URINE, RANDOM: Sodium, Ur: 105 mmol/L

## 2024-06-14 LAB — PROTIME-INR
INR: 1.3 — ABNORMAL HIGH (ref 0.8–1.2)
Prothrombin Time: 16.5 s — ABNORMAL HIGH (ref 11.4–15.2)

## 2024-06-14 MED ORDER — SODIUM CHLORIDE 0.9 % IV BOLUS
1000.0000 mL | Freq: Once | INTRAVENOUS | Status: AC
  Administered 2024-06-14: 1000 mL via INTRAVENOUS

## 2024-06-14 MED ORDER — LACTATED RINGERS IV SOLN: INTRAVENOUS | Status: DC

## 2024-06-14 MED ORDER — SODIUM ZIRCONIUM CYCLOSILICATE 10 G PO PACK
10.0000 g | PACK | Freq: Three times a day (TID) | ORAL | Status: DC
  Administered 2024-06-15: 10 g via ORAL
  Filled 2024-06-14: qty 1

## 2024-06-14 MED ORDER — VANCOMYCIN HCL IN DEXTROSE 1-5 GM/200ML-% IV SOLN
1000.0000 mg | Freq: Once | INTRAVENOUS | Status: AC
  Administered 2024-06-14: 1000 mg via INTRAVENOUS
  Filled 2024-06-14: qty 200

## 2024-06-14 MED ORDER — METRONIDAZOLE 500 MG/100ML IV SOLN
500.0000 mg | Freq: Once | INTRAVENOUS | Status: AC
  Administered 2024-06-14: 500 mg via INTRAVENOUS
  Filled 2024-06-14: qty 100

## 2024-06-14 MED ORDER — LACTATED RINGERS IV BOLUS: 1000.0000 mL | Freq: Once | INTRAVENOUS | Status: DC

## 2024-06-14 MED ORDER — SENNOSIDES-DOCUSATE SODIUM 8.6-50 MG PO TABS: 1.0000 | ORAL_TABLET | Freq: Every evening | ORAL | Status: DC | PRN

## 2024-06-14 MED ORDER — HEPARIN SODIUM (PORCINE) 5000 UNIT/ML IJ SOLN
5000.0000 [IU] | Freq: Three times a day (TID) | INTRAMUSCULAR | Status: DC
  Administered 2024-06-14 – 2024-06-17 (×9): 5000 [IU] via SUBCUTANEOUS
  Filled 2024-06-14 (×9): qty 1

## 2024-06-14 MED ORDER — INSULIN ASPART 100 UNIT/ML IJ SOLN
0.0000 [IU] | Freq: Three times a day (TID) | INTRAMUSCULAR | Status: DC
  Filled 2024-06-14: qty 0.09

## 2024-06-14 MED ORDER — ACETAMINOPHEN 650 MG RE SUPP: 650.0000 mg | RECTAL | Status: DC | PRN

## 2024-06-14 MED ORDER — SODIUM ZIRCONIUM CYCLOSILICATE 10 G PO PACK
10.0000 g | PACK | Freq: Once | ORAL | Status: AC
  Administered 2024-06-14: 10 g via ORAL
  Filled 2024-06-14: qty 1

## 2024-06-14 MED ORDER — ACETAMINOPHEN 325 MG PO TABS
650.0000 mg | ORAL_TABLET | ORAL | Status: DC | PRN
  Administered 2024-06-16 (×2): 650 mg via ORAL
  Filled 2024-06-14 (×2): qty 2

## 2024-06-14 MED ORDER — ACETAMINOPHEN 160 MG/5ML PO SOLN: 650.0000 mg | ORAL | Status: DC | PRN

## 2024-06-14 MED ORDER — SODIUM CHLORIDE 0.9 % IV SOLN
2.0000 g | Freq: Every day | INTRAVENOUS | Status: DC
  Administered 2024-06-15 – 2024-06-16 (×2): 2 g via INTRAVENOUS
  Filled 2024-06-14 (×2): qty 20

## 2024-06-14 MED ORDER — ONDANSETRON HCL 4 MG/2ML IJ SOLN
4.0000 mg | Freq: Four times a day (QID) | INTRAMUSCULAR | Status: DC | PRN
  Administered 2024-06-16: 4 mg via INTRAVENOUS
  Filled 2024-06-14: qty 2

## 2024-06-14 MED ORDER — ATORVASTATIN CALCIUM 40 MG PO TABS
40.0000 mg | ORAL_TABLET | Freq: Every day | ORAL | Status: DC
  Administered 2024-06-15 – 2024-06-16 (×3): 40 mg via ORAL
  Filled 2024-06-14 (×3): qty 1

## 2024-06-14 MED ORDER — ASPIRIN 81 MG PO TBEC
81.0000 mg | DELAYED_RELEASE_TABLET | Freq: Every day | ORAL | Status: DC
  Administered 2024-06-15 – 2024-06-17 (×4): 81 mg via ORAL
  Filled 2024-06-14 (×4): qty 1

## 2024-06-14 MED ORDER — STERILE WATER FOR INJECTION IV SOLN
INTRAVENOUS | Status: DC
  Filled 2024-06-14: qty 150
  Filled 2024-06-14: qty 1000
  Filled 2024-06-14 (×2): qty 150
  Filled 2024-06-14: qty 1000

## 2024-06-14 MED ORDER — STROKE: EARLY STAGES OF RECOVERY BOOK
Freq: Once | Status: DC
  Filled 2024-06-14 (×2): qty 1

## 2024-06-14 MED ORDER — SODIUM CHLORIDE 0.9 % IV SOLN
2.0000 g | Freq: Once | INTRAVENOUS | Status: AC
  Administered 2024-06-14: 2 g via INTRAVENOUS
  Filled 2024-06-14: qty 12.5

## 2024-06-14 NOTE — ED Provider Notes (Signed)
 [place] EMERGENCY DEPARTMENT AT [facility] Provider Note   CSN: [id number] Arrival date & time: 06/14/24  1347     Patient presents with: Knee Pain and Gait Problem   Sara [name] is a 83 y.o. female past medical history significant for right knee pain, CKD, and diabetes presents today for right knee pain.  Patient's family also notes that the patient has been more fatigued and stumbling more lately.  Patient and patient's son denies any recent falls, nausea, vomiting, fever, chills, cough, congestion, chest pain, shortness of breath, or confusion.    Knee Pain Associated symptoms: fatigue        Prior to Admission medications   Medication Sig Start Date End Date Taking? Authorizing Provider  Accu-Chek Softclix Lancets lancets Please use to check blood sugar up to 4 times daily. 08/21/22   Bryan Bianchi, MD  atorvastatin  (LIPITOR) 40 MG tablet Place 1 tablet (40 mg total) into feeding tube daily. Patient taking differently: Take 40 mg by mouth at bedtime. 09/11/22 04/24/24  Ganta, Anupa, DO  blood glucose meter kit and supplies KIT Dispense based on patient and insurance preference. Use up to four times daily as directed. 08/24/22   Jennelle Riis, MD  cetirizine  (ZYRTEC ) 10 MG tablet Take 1 tablet (10 mg total) by mouth daily as needed for allergies. 08/24/22   Jennelle Riis, MD  esomeprazole  (NEXIUM ) 10 MG packet Take 10 mg by mouth daily before breakfast. 09/17/22   Bryan Bianchi, MD  folic acid  (FOLVITE ) 1 MG tablet Take 1 tablet (1 mg total) by mouth daily. Patient taking differently: Take 1 mg by mouth at bedtime. 03/28/24 06/26/24  Perri DELENA Meliton Mickey., MD  glucose blood (ACCU-CHEK GUIDE) test strip USE 1  4 TIMES DAILY 10/25/22   Austin Ade, MD  hydrochlorothiazide  (HYDRODIURIL ) 25 MG tablet Take 25 mg by mouth daily. 04/19/24   [provider]  Insulin  Pen Needle (PEN NEEDLES 3/16) 31G X 5 MM MISC Use as instructed to inject insulin  once  daily 11/14/22   Austin Ade, MD  ketorolac (ACULAR) 0.4 % SOLN Place 1 drop into the left eye 4 (four) times daily.    [provider]  megestrol  (MEGACE ) 40 MG/ML suspension Take by mouth. Patient not taking: Reported on 04/24/2024 04/24/24   [provider]  mirtazapine  (REMERON ) 15 MG tablet Take 15 mg by mouth at bedtime. 04/01/24   [provider]  moxifloxacin (VIGAMOX) 0.5 % ophthalmic solution Place 1 drop into the left eye 3 (three) times daily.    [provider]    Allergies: Patient has no known allergies.    Review of Systems  Constitutional:  Positive for fatigue.  Musculoskeletal:  Positive for arthralgias.    Updated Vital Signs BP (!) 165/57 (BP Location: Right Arm)   Pulse (!) 101   Temp 98.1 F (36.7 C) (Oral)   Resp 16   Ht 5' 1 (1.549 m)   Wt 41.3 kg   SpO2 100%   BMI 17.19 kg/m   Physical Exam Vitals and nursing note reviewed.  Constitutional:      General: She is not in acute distress.    Appearance: She is well-developed. She is not toxic-appearing or diaphoretic.  HENT:     Head: Normocephalic and atraumatic.     Right Ear: External ear normal.     Left Ear: External ear normal.     Mouth/Throat:     Mouth: Mucous membranes are moist.  Eyes:  Extraocular Movements: Extraocular movements intact.     Conjunctiva/sclera: Conjunctivae normal.  Cardiovascular:     Rate and Rhythm: Regular rhythm.     Pulses: Normal pulses.     Heart sounds: No murmur heard.    Comments: Borderline tachycardic on exam Pulmonary:     Effort: Pulmonary effort is normal. No respiratory distress.     Breath sounds: Normal breath sounds.  Abdominal:     Palpations: Abdomen is soft.     Tenderness: There is no abdominal tenderness.  Musculoskeletal:        General: Tenderness present. No swelling or deformity.     Cervical back: Neck supple.     Right lower leg: No edema.     Left lower leg: No edema.     Comments: Minimal  tenderness to palpation of the anterior right knee and tibia.  Patient is neurovascularly intact.  No deformity, ecchymosis, or swelling noted on exam.  Skin:    General: Skin is warm and dry.     Capillary Refill: Capillary refill takes less than 2 seconds.     Findings: No bruising or erythema.  Neurological:     General: No focal deficit present.     Mental Status: She is alert and oriented to person, place, and time.     Sensory: No sensory deficit.  Psychiatric:        Mood and Affect: Mood normal.     (all labs ordered are listed, but only abnormal results are displayed) Labs Reviewed  BASIC METABOLIC PANEL WITH GFR  CBC WITH DIFFERENTIAL/PLATELET  URINALYSIS, W/ REFLEX TO CULTURE (INFECTION SUSPECTED)    EKG: EKG Interpretation Date/Time:  Sunday June 14 2024 14:51:46 EDT Ventricular Rate:  103 PR Interval:  159 QRS Duration:  86 QT Interval:  326 QTC Calculation: 427 R Axis:   72  Text Interpretation: Sinus tachycardia Borderline ST elevation, anterior leads Since last tracing rate faster Confirmed by Randol Simmonds [phone]) on 06/14/2024 2:53:31 PM  Radiology: No results found.   Procedures   Medications Ordered in the ED  sodium chloride  0.9 % bolus 1,000 mL (has no administration in time range)                                    Medical Decision Making  This patient presents to the ED for concern of fatigue and right knee pain, this involves an extensive number of treatment options, and is a complaint that carries with it a high risk of complications and morbidity.  The differential diagnosis includes musculoskeletal pain, fracture, dislocation, UTI, electrolyte abnormality, anemia, arrhythmia, hyperglycemia, hypoglycemia   Co morbidities / Chronic conditions that complicate the patient evaluation  Diabetes, CKD, neuropathy of bilateral legs   Additional history obtained:  Additional history obtained from EMR External records from outside source  obtained and reviewed including previous admission document   Imaging Studies ordered:  I ordered imaging studies including right knee and tib-fib x-rays I independently visualized and interpreted imaging which showed pending I agree with the radiologist interpretation   Cardiac Monitoring: / EKG:  The patient was maintained on a cardiac monitor.  I personally viewed and interpreted the cardiac monitored which showed an underlying rhythm of: Sinus tach, borderline ST elevation in anterior leads  3:04 PM Care of Gwenlyn Childes transferred to PA A. Harris at the end of my shift as the patient will require reassessment once labs/imaging  have resulted. Patient presentation, ED course, and plan of care discussed with review of all pertinent labs and imaging. Please see his/her note for further details regarding further ED course and disposition. Plan at time of handoff is labs and imaging which will determine disposition. This may be altered or completely changed at the discretion of the oncoming team pending results of further workup.      Final diagnoses:  None    ED Discharge Orders     None          Sara [name] 06/14/24 1505    Randol Simmonds, MD 06/15/24 [phone]

## 2024-06-14 NOTE — ED Provider Notes (Signed)
 CKd, fatigue, chronic R knee pain, increased difficult ambulating     Physical Exam  BP 135/65   Pulse (!) 120   Temp 98.3 F (36.8 C) (Oral)   Resp (!) 22   Ht 5' 1 (1.549 m)   Wt 48.3 kg   SpO2 100%   BMI 20.12 kg/m   Physical Exam Vitals and nursing note reviewed.  Constitutional:      General: She is not in acute distress.    Appearance: She is well-developed. She is not diaphoretic.  HENT:     Head: Normocephalic and atraumatic.     Right Ear: External ear normal.     Left Ear: External ear normal.     Nose: Nose normal.     Mouth/Throat:     Mouth: Mucous membranes are moist.  Eyes:     General: No scleral icterus.    Conjunctiva/sclera: Conjunctivae normal.  Cardiovascular:     Rate and Rhythm: Normal rate and regular rhythm.     Heart sounds: Normal heart sounds. No murmur heard.    No friction rub. No gallop.  Pulmonary:     Effort: Pulmonary effort is normal. No respiratory distress.     Breath sounds: Normal breath sounds.  Abdominal:     General: Bowel sounds are normal. There is no distension.     Palpations: Abdomen is soft. There is no mass.     Tenderness: There is no abdominal tenderness. There is no guarding.  Musculoskeletal:     Cervical back: Normal range of motion.     Comments: Bilateral knee examination shows equal size of the knees.  Patient has full range of motion of the knee.  She states her knee pain is predominately when she bears weight.  Is no obvious swelling of the extremity.  Skin:    General: Skin is warm and dry.  Neurological:     Mental Status: She is alert and oriented to person, place, and time.     Cranial Nerves: No cranial nerve deficit.     Sensory: No sensory deficit.     Motor: No weakness.     Coordination: Coordination normal.     Gait: Gait abnormal.     Deep Tendon Reflexes: Reflexes normal.  Psychiatric:        Behavior: Behavior normal.     Procedures  .Critical Care  Performed by: Arloa Chroman,  PA-C Authorized by: Arloa Chroman, PA-C   Critical care provider statement:    Critical care time (minutes):  80   Critical care time was exclusive of:  Separately billable procedures and treating other patients   Critical care was necessary to treat or prevent imminent or life-threatening deterioration of the following conditions:  Renal failure, sepsis and CNS failure or compromise   Critical care was time spent personally by me on the following activities:  Development of treatment plan with patient or surrogate, discussions with consultants, evaluation of patient's response to treatment, examination of patient, ordering and review of laboratory studies, ordering and review of radiographic studies, ordering and performing treatments and interventions, pulse oximetry, re-evaluation of patient's condition, review of old charts, obtaining history from patient or surrogate and interpretation of cardiac output measurements   Care discussed with: admitting provider     ED Course / MDM   Clinical Course as of 06/18/24 1257  Sun Jun 14, 2024  1612 Patient reevaluated at bedside due to white blood cell count of 18,000.  History gathered at bedside from  the patient and her son.  He reports that since last Friday, 3 days ago the patient has been stumbling around and seemed lethargic.  He states that she normally has some speech issues due to her dentures but she seems to have increasing issues with her speech.  He reports that she has had some issues with her potassium in the past and has been taking some medication.  Patient denies ear ringing or room spinning dizzy dizziness.  She denies any changes in her vision but does state that she is having trouble getting her balance and has to hold onto things. [AH]  [phone] Patient and family both report that she has not been sick recently.  She has not had any fever, chills, urinary symptoms, cough, abdominal pain.  Son reports that she was previously given Megace   to improve her appetite but is not taking anymore and has had decreased appetite. [AH]  1922 Urinalysis, w/ Reflex to Culture (Infection Suspected) -Urine, Clean Catch(!) Urine appears infected. [AH]  Thu Jun 18, 2024  1246 Creatinine(!): 9.52 [AH]    Clinical Course User Index [AH] Arloa Chroman, PA-C   Medical Decision Making Amount and/or Complexity of Data Reviewed Labs: ordered. Decision-making details documented in ED Course. Radiology: ordered.  Risk Prescription drug management. Decision regarding hospitalization.   This patient presents to the ED for concern of disequilibrium and lethargy, this involves an extensive number of treatment options, and is a complaint that carries with it a high risk of complications and morbidity.  The differential diagnosis of weakness includes but is not limited to neurologic causes (GBS, myasthenia gravis, CVA, MS, ALS, transverse myelitis, spinal cord injury, CVA, botulism, ) and other causes: ACS, Arrhythmia, syncope, orthostatic hypotension, sepsis, hypoglycemia, electrolyte disturbance, hypothyroidism, respiratory failure, symptomatic anemia, dehydration, heat injury, polypharmacy, malignancy.   Co morbidities: .  has a past medical history of Allergy, Dizziness (02/21/2018), Estrogen deficiency (01/13/2018), Need for prophylactic vaccination against Streptococcus pneumoniae (pneumococcus) (01/13/2018), Normocytic anemia (04/11/2024), Right knee pain (05/29/2019), Routine general medical examination at a health care facility (01/13/2018), Seasonal allergies (06/21/2020), Vaginal itching (06/21/2020), and Vision changes (01/13/2018).  Social Determinants of Health:   SDOH Screenings   Food Insecurity: No Food Insecurity (06/15/2024)  Housing: Low Risk  (06/15/2024)  Transportation Needs: No Transportation Needs (06/15/2024)  Utilities: Not At Risk (06/15/2024)  Alcohol  Screen: Low Risk  (07/05/2021)  Depression (PHQ2-9): Low Risk  (04/12/2023)   Financial Resource Strain: Low Risk  (07/05/2021)  Physical Activity: Insufficiently Active (07/05/2021)  Social Connections: Moderately Integrated (06/15/2024)  Stress: No Stress Concern Present (07/05/2021)  Tobacco Use: Low Risk  (06/14/2024)     Additional history:  {Additional history obtained from son   Lab Tests:  I Ordered, and personally interpreted labs.  The pertinent results include:   WBC- 18.6 HGB 10.1- ( higher likely volume contraction) Potassium 6.4 BUN 130 Cr 9.52 << baseline of 2.0 AST 52 ALP 31 ALK 165 - nonsepecific AG- 18  ( BUN/ LA levels contributing) LA 3.9 << 2.3 after fluids UA - appears infected  Imaging Studies:  I ordered imaging studies including plain films R knee/tib fib and cxr, MR brain I independently visualized and interpreted imaging which showed Plain films without acute finding, MRI shows acute pontine infarct I agree with the radiologist interpretation  Cardiac Monitoring/ECG:  The patient was maintained on a cardiac monitor.  I personally viewed and interpreted the cardiac monitored which showed an underlying rhythm of:   Sinus tachycardia rate 103  Medicines ordered and prescription drug management:  I ordered medication including  Medications  lactated ringers  infusion (0 mLs Intravenous Stopped 06/17/24 0922)  sodium chloride  0.9 % bolus 1,000 mL (0 mLs Intravenous Stopped 06/14/24 1740)  sodium chloride  0.9 % bolus 1,000 mL (0 mLs Intravenous Stopped 06/14/24 2000)  sodium chloride  0.9 % bolus 1,000 mL (0 mLs Intravenous Stopped 06/14/24 1740)  sodium zirconium cyclosilicate  (LOKELMA ) packet 10 g (10 g Oral Given 06/14/24 1849)  ceFEPIme  (MAXIPIME ) 2 g in sodium chloride  0.9 % 100 mL IVPB (0 g Intravenous Stopped 06/14/24 1919)  metroNIDAZOLE  (FLAGYL ) IVPB 500 mg (0 mg Intravenous Stopped 06/14/24 1949)  vancomycin  (VANCOCIN ) IVPB 1000 mg/200 mL premix (0 mg Intravenous Stopped 06/14/24 1949)   for Sepsis, acute renal  failure, hyperkalemia Reevaluation of the patient after these medicines showed that the patient improved I have reviewed the patients home medicines and have made adjustments as needed  Test Considered:  Renal US - pending  Critical Interventions:    Consultations Obtained:  Dr. Geralynn w/ nephrology; Dr. ALONSO Blanch for admission  Problem List / ED Course:     ICD-10-CM   1. Acute renal failure, unspecified acute renal failure type (HCC)  N17.9     2. Cancer involving organ by direct extension from urinary bladder (HCC)  C67.9     3. Hyperkalemia  E87.5     4. Ataxia  R27.0     5. Acute CVA (cerebrovascular accident) (HCC)  I63.9     6. Type 2 diabetes mellitus with chronic kidney disease, without long-term current use of insulin , unspecified CKD stage (HCC)  E11.22 Diet Carb Modified    7. Sepsis with multi-organ dysfunction (HCC)  A41.9    R65.20       MDM:  Patient here with fatigue and disequilibrium. Findings consistente with Acute pontine infarct and sepsis likely from UTI with multisystem organ involvement and acute renal failure.  Patient critically ill   Dispostion:  After consideration of the diagnostic results and the patients response to treatment, I feel that the patent would benefit from hospitalization.        Arloa Chroman, PA-C 06/18/24 1257    Dean Clarity, MD 06/18/24 [phone]

## 2024-06-14 NOTE — ED Notes (Signed)
 Patient transported to MRI

## 2024-06-14 NOTE — H&P (Signed)
 History and Physical    Sara [name] FMW:[id number] DOB: 20-Aug-1941 DOA: 06/14/2024  PCP: Campbell Reynolds, NP  Patient coming from: Home  I have personally briefly reviewed patient's old medical records in [facility]  Chief Complaint: Imbalance  HPI: Sara [name] is a 83 y.o. female with medical history significant for CKD stage IIIb, T2DM, HTN, HLD who presents to the ED for evaluation of gait imbalance.  Patient states that for the last 2-3 days she has been having some issues with gait imbalance.  She has been having to brace herself on the walls and with furniture in the house.  She has not fallen, lost consciousness, or had any seizure-like activity.  She has chronic right knee pain which has been bothering her lately as well.    She has had ongoing issues with dysphagia and poor oral intake which has led to several recent admissions for AKI.  Patient does state that she has had decreased urine output than expected recently.  She also reports burning sensation with urination.  She denies fevers, chills, diaphoresis, chest pain, dyspnea, cough.  She denies any new weakness in her extremities.  She denies difficulty with word finding or slurred speech.  ED Course  Labs/Imaging on admission: I have personally reviewed following labs and imaging studies.  Initial vitals showed BP 165/57, pulse 101, RR 16, temp 98.1 F, SpO2 100% on room air.  Labs showed WBC 18.6, hemoglobin 10.1, platelets 302, sodium 130, potassium 6.4, bicarb 13, BUN 130, creatinine 9.52, serum glucose 323, lactic acid 3.9 > 2.3.  Urinalysis showed negative nitrates, small leukocytes, >50 RBCs and WBCs, few bacteria.  Blood and urine cultures in process.  Right knee x-ray negative for acute osseous findings.  Right tibia/fibula x-ray negative for acute osseous findings.  Portable chest x-ray negative for focal consolidation, edema, effusion.  MRI brain without contrast showed punctate acute left  pontine infarct and moderate chronic small vessel ischemic disease.  Patient was given 3 L normal saline, IV vancomycin , cefepime , Flagyl , oral Lokelma .  EDP discussed with nephrology who are seeing in consultation.  The hospitalist service was consulted to admit.  Review of Systems: All systems reviewed and are negative except as documented in history of present illness above.   Past Medical History:  Diagnosis Date   Allergy    Dizziness 02/21/2018   Estrogen deficiency 01/13/2018   Need for prophylactic vaccination against Streptococcus pneumoniae (pneumococcus) 01/13/2018   Normocytic anemia 04/11/2024   Right knee pain 05/29/2019   Routine general medical examination at a health care facility 01/13/2018   Seasonal allergies 06/21/2020   Vaginal itching 06/21/2020   Vision changes 01/13/2018    Past Surgical History:  Procedure Laterality Date   DG THUMB RIGHT HAND ([facility] HX) Right    EYE SURGERY     IR GASTROSTOMY TUBE MOD SED  09/05/2022   IR GASTROSTOMY TUBE REMOVAL  12/04/2022   TUBAL LIGATION      Social History: Social History   Tobacco Use   Smoking status: Never   Smokeless tobacco: Never  Vaping Use   Vaping status: Never Used  Substance Use Topics   Alcohol  use: Never   Drug use: Never   No Known Allergies  Family History  Problem Relation Age of Onset   Heart disease Mother    Diabetes Mother    Other Mother        Pace Maker   Alzheimer's disease Father    Cancer Sister  Cancer Brother    Alzheimer's disease Brother    Breast cancer Neg Hx      Prior to Admission medications   Medication Sig Start Date End Date Taking? Authorizing Provider  Accu-Chek Softclix Lancets lancets Please use to check blood sugar up to 4 times daily. 08/21/22   Bryan Bianchi, MD  atorvastatin  (LIPITOR) 40 MG tablet Place 1 tablet (40 mg total) into feeding tube daily. Patient taking differently: Take 40 mg by mouth at bedtime. 09/11/22 04/24/24  Ganta, Anupa, DO  blood  glucose meter kit and supplies KIT Dispense based on patient and insurance preference. Use up to four times daily as directed. 08/24/22   Jennelle Riis, MD  cetirizine  (ZYRTEC ) 10 MG tablet Take 1 tablet (10 mg total) by mouth daily as needed for allergies. 08/24/22   Jennelle Riis, MD  esomeprazole  (NEXIUM ) 10 MG packet Take 10 mg by mouth daily before breakfast. 09/17/22   Bryan Bianchi, MD  folic acid  (FOLVITE ) 1 MG tablet Take 1 tablet (1 mg total) by mouth daily. Patient taking differently: Take 1 mg by mouth at bedtime. 03/28/24 06/26/24  Perri DELENA Meliton Mickey., MD  glucose blood (ACCU-CHEK GUIDE) test strip USE 1  4 TIMES DAILY 10/25/22   Austin Ade, MD  hydrochlorothiazide  (HYDRODIURIL ) 25 MG tablet Take 25 mg by mouth daily. 04/19/24   [provider]  Insulin  Pen Needle (PEN NEEDLES 3/16) 31G X 5 MM MISC Use as instructed to inject insulin  once daily 11/14/22   Austin Ade, MD  ketorolac (ACULAR) 0.4 % SOLN Place 1 drop into the left eye 4 (four) times daily.    [provider]  megestrol  (MEGACE ) 40 MG/ML suspension Take by mouth. Patient not taking: Reported on 04/24/2024 04/24/24   [provider]  mirtazapine  (REMERON ) 15 MG tablet Take 15 mg by mouth at bedtime. 04/01/24   [provider]  moxifloxacin (VIGAMOX) 0.5 % ophthalmic solution Place 1 drop into the left eye 3 (three) times daily.    [provider]    Physical Exam: Vitals:   06/14/24 1358 06/14/24 1708 06/14/24 1751 06/14/24 2015  BP:  (!) 144/81  (!) 142/71  Pulse:  (!) 106  (!) 122  Resp:  19  16  Temp:   98.2 F (36.8 C) 98.3 F (36.8 C)  TempSrc:      SpO2:  98%  100%  Weight: 41.3 kg     Height: 5' 1 (1.549 m)      Constitutional: Resting in bed, NAD, calm, comfortable Eyes: PERRL, EOMI, lids and conjunctivae normal ENMT: Mucous membranes are moist. Posterior pharynx clear of any exudate or lesions.Normal dentition.  Neck: normal, supple, no  masses. Respiratory: clear to auscultation bilaterally, no wheezing, no crackles. Normal respiratory effort. No accessory muscle use.  Cardiovascular: Regular rate and rhythm, no murmurs / rubs / gallops. No extremity edema. 2+ pedal pulses. Abdomen: no tenderness, no masses palpated. Musculoskeletal: no clubbing / cyanosis. No joint deformity upper and lower extremities. Good ROM, no contractures. Normal muscle tone.  Skin: no rashes, lesions, ulcers. No induration Neurologic: Sensation intact. Strength 5/5 in all 4.  FTN intact. Psychiatric: Normal judgment and insight. Alert and oriented x 3. Normal mood.   EKG: Personally reviewed. Sinus tachycardia, rate 103, minimal ST elevation with repolarization changes.  Assessment/Plan Principal Problem:   Severe sepsis with lactic acidosis (HCC) Active Problems:   Acute renal failure superimposed on stage 3b chronic kidney disease (HCC)   Hypertension associated with  diabetes (HCC)   Left pontine stroke (HCC)   Type 2 diabetes mellitus with chronic kidney disease, without long-term current use of insulin  (HCC)   Hyperkalemia   Acute urinary retention   Hyperlipidemia associated with type 2 diabetes mellitus (HCC)   Sara [name] is a 82 y.o. female with medical history significant for CKD stage IIIb, T2DM, HTN, HLD who is admitted with severe sepsis due to UTI and acute left pontine stroke.  Assessment and Plan: Severe sepsis due to urinary tract infection: Patient presenting with leukocytosis, tachycardia, lactic acidosis, AKI.  Urine suggestive of UTI as infectious etiology.  She has been having dysuria as well as urinary retention. - Narrow antibiotics to IV ceftriaxone  - Continue IV fluid hydration overnight - Follow blood and urine cultures  Acute renal failure superimposed on CKD stage IIIb: BUN 130 and creatinine 9.52 on admission.  Recent baseline creatinine has been 1.5-1.8.  This is likely multifactorial in setting of  urinary retention, volume depletion, sepsis physiology, and medication effect. - Nephrology following, appreciate recommendations - Continuing IV fluid hydration, fluids changed to bicarb drip with next bag at 150 cc/hour - Foley placed in the ED, monitor strict I/O's - Renal ultrasound negative for obstruction/hydronephrosis; shows increased cortical echogenicity bilaterally - Hold Lasix, discontinue HCTZ  Acute left pontine infarct: Punctate acute left pontine infarct seen on MRI brain.  Patient reports recent gait imbalance.  No focal deficits present at time of admission. - Ad[facility] to [facility] - Neurology to consult - Start aspirin  81 mg daily - Continue atorvastatin  - Obtain echocardiogram - Keep on telemetry, continue neurochecks - PT/OT/SLP eval  Acute urinary retention: Likely resulting in UTI/sepsis and AKI.  Foley catheter placed in ED with 650 cc UOP.  Keep Foley in place for now.  Hyperkalemia: Continue Lokelma .  Hold potassium supplement.  Type 2 diabetes with hyperglycemia: Placed on SSI.  Hypertension: Holding Lasix and HCTZ.  Hyperlipidemia: Continue atorvastatin .   DVT prophylaxis: heparin  injection 5,000 Units Start: 06/14/24 2200 Code Status: Full code, confirmed with patient on admission Family Communication: Son at bedside Disposition Plan: From home, dispo pending clinical progress Consults called: Nephrology, neurology Severity of Illness: The appropriate patient status for this patient is INPATIENT. Inpatient status is judged to be reasonable and necessary in order to provide the required intensity of service to ensure the patient's safety. The patient's presenting symptoms, physical exam findings, and initial radiographic and laboratory data in the context of their chronic comorbidities is felt to place them at high risk for further clinical deterioration. Furthermore, it is not anticipated that the patient will be medically stable for discharge  from the hospital within 2 midnights of admission.   * I certify that at the point of admission it is my clinical judgment that the patient will require inpatient hospital care spanning beyond 2 midnights from the point of admission due to high intensity of service, high risk for further deterioration and high frequency of surveillance required.DEWAINE Jorie Blanch MD [facility]  If 7PM-7AM, please contact night-coverage [email or web address]  06/14/2024, 11:27 PM

## 2024-06-14 NOTE — Hospital Course (Signed)
 Sara [name] is a 83 y.o. female with medical history significant for CKD stage IIIb, T2DM, HTN, HLD who is admitted with severe sepsis due to UTI and acute left pontine stroke.

## 2024-06-14 NOTE — Consult Note (Addendum)
 Renal Service Consult Note [facility]  Sara [name] 06/14/2024 Sara JONETTA Fret, MD Requesting Physician: Dr. Tobie, V.   Reason for Consult: Renal failure HPI: The patient is a 83 y.o. year-old w/ PMH as below who presented to ED for R knee pain, lethargy and imbalanced gait at home. Family noted also more fatigued. Denied any n/v/d, f/c/s, cough, CP, SOB. In ED BP 165/ 75, HR 102, RR 16-22, temp 98. WBC 18K, Hb 10, creat 9.5 (b/l 1.3-1.5) and K+ 6.4. CO2 13, AG 22, LA 3.9. Pt was given 3 L bolus IVFs and started on IV abx for possible sepsis. A foley cath was placed as the patient couldn't void, and the foley returned 650 cc right away post-placement. Pt is to be admitted. We are asked to see for renal failure.    Pt seen in ED room. Family give hx of pt's fatigue and gait imbalance for the last 2-3 days. No confusion. No seizure like activity. Eating but not a lot.    ROS - denies CP, no joint pain, no HA, no blurry vision, no rash, no diarrhea, no nausea/ vomiting   Past Medical History  Past Medical History:  Diagnosis Date   Allergy    Dizziness 02/21/2018   Estrogen deficiency 01/13/2018   Need for prophylactic vaccination against Streptococcus pneumoniae (pneumococcus) 01/13/2018   Normocytic anemia 04/11/2024   Right knee pain 05/29/2019   Routine general medical examination at a health care facility 01/13/2018   Seasonal allergies 06/21/2020   Vaginal itching 06/21/2020   Vision changes 01/13/2018   Past Surgical History  Past Surgical History:  Procedure Laterality Date   DG THUMB RIGHT HAND ([facility] HX) Right    EYE SURGERY     IR GASTROSTOMY TUBE MOD SED  09/05/2022   IR GASTROSTOMY TUBE REMOVAL  12/04/2022   TUBAL LIGATION     Family History  Family History  Problem Relation Age of Onset   Heart disease Mother    Diabetes Mother    Other Mother        Visual merchandiser   Alzheimer's disease Father    Cancer Sister    Cancer Brother    Alzheimer's  disease Brother    Breast cancer Neg Hx    Social History  reports that she has never smoked. She has never used smokeless tobacco. She reports that she does not drink alcohol  and does not use drugs. Allergies No Known Allergies Home medications Prior to Admission medications   Medication Sig Start Date End Date Taking? Authorizing Provider  Accu-Chek Softclix Lancets lancets Please use to check blood sugar up to 4 times daily. 08/21/22   Bryan Bianchi, MD  atorvastatin  (LIPITOR) 40 MG tablet Place 1 tablet (40 mg total) into feeding tube daily. Patient taking differently: Take 40 mg by mouth at bedtime. 09/11/22 04/24/24  Ganta, Anupa, DO  blood glucose meter kit and supplies KIT Dispense based on patient and insurance preference. Use up to four times daily as directed. 08/24/22   Jennelle Riis, MD  cetirizine  (ZYRTEC ) 10 MG tablet Take 1 tablet (10 mg total) by mouth daily as needed for allergies. 08/24/22   Jennelle Riis, MD  esomeprazole  (NEXIUM ) 10 MG packet Take 10 mg by mouth daily before breakfast. 09/17/22   Bryan Bianchi, MD  folic acid  (FOLVITE ) 1 MG tablet Take 1 tablet (1 mg total) by mouth daily. Patient taking differently: Take 1 mg by mouth at bedtime. 03/28/24 06/26/24  Perri DELENA Mau  Mickey., MD  glucose blood (ACCU-CHEK GUIDE) test strip USE 1  4 TIMES DAILY 10/25/22   Austin Ade, MD  hydrochlorothiazide  (HYDRODIURIL ) 25 MG tablet Take 25 mg by mouth daily. 04/19/24   [provider]  Insulin  Pen Needle (PEN NEEDLES 3/16) 31G X 5 MM MISC Use as instructed to inject insulin  once daily 11/14/22   Austin Ade, MD  ketorolac (ACULAR) 0.4 % SOLN Place 1 drop into the left eye 4 (four) times daily.    [provider]  megestrol  (MEGACE ) 40 MG/ML suspension Take by mouth. Patient not taking: Reported on 04/24/2024 04/24/24   [provider]  mirtazapine  (REMERON ) 15 MG tablet Take 15 mg by mouth at bedtime. 04/01/24   [provider]   moxifloxacin (VIGAMOX) 0.5 % ophthalmic solution Place 1 drop into the left eye 3 (three) times daily.    [provider]     Vitals:   06/14/24 1353 06/14/24 1358 06/14/24 1708 06/14/24 1751  BP: (!) 165/57  (!) 144/81   Pulse: (!) 101  (!) 106   Resp: 16  19   Temp: 98.1 F (36.7 C)   98.2 F (36.8 C)  TempSrc: Oral     SpO2: 100%  98%   Weight:  41.3 kg    Height:  5' 1 (1.549 m)     Exam Gen alert, no distress, elderly female, follows commands and interacting No rash, cyanosis or gangrene Sclera anicteric, throat clear and moist No jvd or bruits Chest clear bilat to bases, no rales/ wheezing RRR no MRG Abd soft ntnd no mass or ascites +bs GU foley placed draining large amts of cloudy w/ blood tinged sediment MS no joint effusions or deformity Ext no LE or UE edema, no other edema Neuro is alert, Ox 3 , nf   Home bp meds: Hydrochlorothiazide  Others: lipitor, nexium , megace  (not taking), remeron , prns    Date   Creat  eGFR (ml/min) 2019- 2022  0.69- 1.12 2023   0.73- 2.13 2024   0.85- 1.00 57- 69 ml/min May 2025  2.24 >> 1.52 21- 34 ml/min June 2025  1.75- 2.91 15- 29 ml/min 06/14/24  9.52  4 ml/min     UA: mod Hb, small LE, prot 100, >50 rbc/ >50 wbc/ 0-5 epi UNa, UCr: pend Renal US : pend  Assessment/ Plan: AKI on CKD 3b: b/l creatinine 1.5- 1.8 from may-June 2025, eGFR 29- 34 ml/min.  Creatinine here is 9.52 in the setting of urinary retention, probable UTI, vol depletion, fatigue. UA looks infected, renal US  pending. BP's good in ED, pt rec'd 3 L NS bolus and is getting LR at 150 cc/hr. Foley placed w/ 650 cc urine output, suggestive of bladder retention. AKI likely due to urinary retention (resolved w/ foley) + poss vol depletion + UTI/ mild sepsis. Agree w/ IVFs and IV abx, supportive and leave foley cath if for now. Get renal US , urine lytes. Cont IVFs 150 cc/hr, change to bicarb gtt with next bag. F/u labs in am. Have d/w family and pt and  questions answered.  Hyperkalemia: renal diet if eating, lokelma  10gm tid until K < 5.  HTN: on hydrochlorothiazide  at home. Was on losartan  but per family they believe it was stopped not too long ago.  Met acidosis: changing LR to Na bicarb at 150 /hr.        Myer Fret  MD [facility] 06/14/2024, 7:59 PM  Recent Labs  Lab 06/14/24 1507 06/14/24 1620  CREATININE 9.52* 9.40*  K 6.4* 6.0*   Inpatient medications:  [START ON 06/15/2024] sodium zirconium cyclosilicate   10 g Oral TID    lactated ringers      lactated ringers  150 mL/hr at 06/14/24 1849

## 2024-06-14 NOTE — ED Triage Notes (Signed)
 Keri is bringing patient in because she has been complaining of right knee pain but they noticed she has been more lethargic and stumbling around.  Also reports her eyes are puffy.

## 2024-06-14 NOTE — Progress Notes (Signed)
 Elink following for sepsis protocol.

## 2024-06-15 ENCOUNTER — Inpatient Hospital Stay (HOSPITAL_COMMUNITY)

## 2024-06-15 DIAGNOSIS — E872 Acidosis, unspecified: Secondary | ICD-10-CM | POA: Diagnosis not present

## 2024-06-15 DIAGNOSIS — A419 Sepsis, unspecified organism: Secondary | ICD-10-CM | POA: Diagnosis not present

## 2024-06-15 DIAGNOSIS — N179 Acute kidney failure, unspecified: Secondary | ICD-10-CM

## 2024-06-15 DIAGNOSIS — E1151 Type 2 diabetes mellitus with diabetic peripheral angiopathy without gangrene: Secondary | ICD-10-CM

## 2024-06-15 DIAGNOSIS — I6381 Other cerebral infarction due to occlusion or stenosis of small artery: Secondary | ICD-10-CM

## 2024-06-15 DIAGNOSIS — R6520 Severe sepsis without septic shock: Secondary | ICD-10-CM | POA: Diagnosis not present

## 2024-06-15 DIAGNOSIS — N1832 Chronic kidney disease, stage 3b: Secondary | ICD-10-CM | POA: Diagnosis not present

## 2024-06-15 DIAGNOSIS — I6389 Other cerebral infarction: Secondary | ICD-10-CM | POA: Diagnosis not present

## 2024-06-15 LAB — BLOOD CULTURE ID PANEL (REFLEXED) - BCID2

## 2024-06-15 LAB — ECHOCARDIOGRAM COMPLETE
AR max vel: 2.05 cm2
AV Area VTI: 2.09 cm2
AV Area mean vel: 1.96 cm2
AV Mean grad: 5.5 mmHg
AV Peak grad: 9.7 mmHg
Ao pk vel: 1.56 m/s
Area-P 1/2: 6.54 cm2
Height: 61 in
MV VTI: 2.13 cm2
S' Lateral: 1.9 cm
Weight: 1456 [oz_av]

## 2024-06-15 LAB — LIPID PANEL
Cholesterol: 60 mg/dL (ref 0–200)
HDL: <10 mg/dL — ABNORMAL LOW (ref >40)
Triglycerides: 69 mg/dL (ref <150)
VLDL: 14 mg/dL (ref 0–40)

## 2024-06-15 LAB — URINALYSIS, W/ REFLEX TO CULTURE (INFECTION SUSPECTED)
Bilirubin Urine: NEGATIVE
Glucose, UA: 150 mg/dL — AB
Ketones, ur: NEGATIVE mg/dL
Nitrite: NEGATIVE
Protein, ur: 100 mg/dL — AB
RBC / HPF: >50 RBC/hpf (ref 0–5)
Specific Gravity, Urine: 1.014 (ref 1.005–1.030)
WBC, UA: >50 WBC/hpf (ref 0–5)
pH: 5 (ref 5.0–8.0)

## 2024-06-15 LAB — RENAL FUNCTION PANEL
Albumin: 1.9 g/dL — ABNORMAL LOW (ref 3.5–5.0)
Anion gap: 13 (ref 5–15)
BUN: 95 mg/dL — ABNORMAL HIGH (ref 8–23)
CO2: 13 mmol/L — ABNORMAL LOW (ref 22–32)
Calcium: 8.3 mg/dL — ABNORMAL LOW (ref 8.9–10.3)
Chloride: 107 mmol/L (ref 98–111)
Creatinine, Ser: 7.51 mg/dL — ABNORMAL HIGH (ref 0.44–1.00)
GFR, Estimated: 5 mL/min — ABNORMAL LOW (ref >60)
Glucose, Bld: 112 mg/dL — ABNORMAL HIGH (ref 70–99)
Phosphorus: 4 mg/dL (ref 2.5–4.6)
Potassium: 4.8 mmol/L (ref 3.5–5.1)
Sodium: 133 mmol/L — ABNORMAL LOW (ref 135–145)

## 2024-06-15 LAB — CBC
HCT: 27.7 % — ABNORMAL LOW (ref 36.0–46.0)
Hemoglobin: 9.7 g/dL — ABNORMAL LOW (ref 12.0–15.0)
MCH: 26.4 pg (ref 26.0–34.0)
MCHC: 35 g/dL (ref 30.0–36.0)
MCV: 75.5 fL — ABNORMAL LOW (ref 80.0–100.0)
Platelets: 237 K/uL (ref 150–400)
RBC: 3.67 MIL/uL — ABNORMAL LOW (ref 3.87–5.11)
RDW: 14.6 % (ref 11.5–15.5)
WBC: 19.9 K/uL — ABNORMAL HIGH (ref 4.0–10.5)
nRBC: 0 % (ref 0.0–0.2)

## 2024-06-15 LAB — CBG MONITORING, ED: Glucose-Capillary: 97 mg/dL (ref 70–99)

## 2024-06-15 LAB — GLUCOSE, CAPILLARY
Glucose-Capillary: 81 mg/dL (ref 70–99)
Glucose-Capillary: 94 mg/dL (ref 70–99)
Glucose-Capillary: 93 mg/dL (ref 70–99)

## 2024-06-15 LAB — HEMOGLOBIN A1C
Hgb A1c MFr Bld: 6.6 % — ABNORMAL HIGH (ref 4.8–5.6)
Mean Plasma Glucose: 142.72 mg/dL

## 2024-06-15 NOTE — Progress Notes (Signed)
  PT Cancellation Note  Patient Details Name: Sara [name] MRN: [id number] DOB: [birth year]   Cancelled Treatment:    Reason Eval/Treat Not Completed: Other (comment) Transfer to [facility].Sara [name] PT Acute Rehabilitation Services Office [phone]   [name] Sara [name] 06/15/2024, 10:07 AM

## 2024-06-15 NOTE — Consult Note (Addendum)
 NEUROLOGY CONSULT NOTE   Date of service: June 15, 2024 Patient Name: Sara [name] MRN:  [id number] DOB:  07-11-1941 Chief Complaint: Knee Pain Requesting Provider: Kathrin Mignon DASEN, MD  History of Present Illness  Sara [name] is a 83 y.o. 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 Daughter states that on Friday, she started having symptoms of gait imbalance, where she looked wobbly. She did not have any falls, loss of consciousness, but did not look stable while she was walking with her walker. She also was having memory problems, where she would take a long time to answer questions that she would normally have known. Pt was not interested in going to the hospital, but was convinced by her children to go to evaluate her knee pain. While there, workup was remarkable for UTI, WBC of 18.6, and LA of 3.9. MRI brain was subsequently performed which was positive for an acute CVA. Pt states that she feels fine, and does not recall these episodes.    ROS  Comprehensive ROS performed and pertinent positives documented in HPI   Past History   Past Medical History:  Diagnosis Date   Allergy    Dizziness 02/21/2018   Estrogen deficiency 01/13/2018   Need for prophylactic vaccination against Streptococcus pneumoniae (pneumococcus) 01/13/2018   Normocytic anemia 04/11/2024   Right knee pain 05/29/2019   Routine general medical examination at a health care facility 01/13/2018   Seasonal allergies 06/21/2020   Vaginal itching 06/21/2020   Vision changes 01/13/2018    Past Surgical History:  Procedure Laterality Date   DG THUMB RIGHT HAND ([facility] HX) Right    EYE SURGERY     IR GASTROSTOMY TUBE MOD SED  09/05/2022   IR GASTROSTOMY TUBE REMOVAL  12/04/2022   TUBAL LIGATION      Family History: Family History  Problem Relation Age of Onset   Heart disease Mother    Diabetes  Mother    Other Mother        Visual merchandiser   Alzheimer's disease Father    Cancer Sister    Cancer Brother    Alzheimer's disease Brother    Breast cancer Neg Hx     Social History  reports that she has never smoked. She has never used smokeless tobacco. She reports that she does not drink alcohol  and does not use drugs.  No Known Allergies  Medications   Current Facility-Administered Medications:     stroke: early stages of recovery book, , Does not apply, Once, Tobie Jorie SAUNDERS, MD   acetaminophen  (TYLENOL ) tablet 650 mg, 650 mg, Oral, Q4H PRN **OR** acetaminophen  (TYLENOL ) 160 MG/5ML solution 650 mg, 650 mg, Per Tube, Q4H PRN **OR** acetaminophen  (TYLENOL ) suppository 650 mg, 650 mg, Rectal, Q4H PRN, Tobie Jorie R, MD   aspirin  EC tablet 81 mg, 81 mg, Oral, Daily, Tobie, Vishal R, MD, 81 mg at 06/15/24 1056   atorvastatin  (LIPITOR) tablet 40 mg, 40 mg, Oral, QHS, Patel, Vishal R, MD, 40 mg at 06/15/24 0020   cefTRIAXone  (ROCEPHIN ) 2 g in sodium chloride  0.9 % 100 mL IVPB, 2 g, Intravenous, q1800, Tobie Jorie R, MD   heparin  injection 5,000 Units, 5,000 Units, Subcutaneous, Q8H, Patel, Vishal R, MD, 5,000 Units at 06/15/24 9380   insulin  aspart (novoLOG ) injection 0-9 Units, 0-9 Units, Subcutaneous, TID WC, Patel, Vishal R, MD   ondansetron  (ZOFRAN ) injection 4  mg, 4 mg, Intravenous, Q6H PRN, Tobie, Vishal R, MD   senna-docusate (Senokot-S) tablet 1 tablet, 1 tablet, Oral, QHS PRN, Tobie Jorie SAUNDERS, MD   sodium bicarbonate  150 mEq in sterile water  1,150 mL infusion, , Intravenous, Continuous, Geralynn Charleston, MD, Last Rate: 150 mL/hr at 06/15/24 0645, New Bag at 06/15/24 0645  Vitals   Vitals:   06/15/24 0500 06/15/24 0615 06/15/24 1034 06/15/24 1149  BP: 138/72 136/69 118/64 (!) 152/59  Pulse: (!) 118 (!) 116 (!) 101 (!) 104  Resp: 17 18 20 19   Temp:  98.5 F (36.9 C) 98.5 F (36.9 C) 98.6 F (37 C)  TempSrc:   Oral Oral  SpO2: 100% 100% 100% 100%  Weight:      Height:         Body mass index is 17.19 kg/m.   Physical Exam   Constitutional: Appears well-developed and well-nourished.  Psych: Affect appropriate to situation.  Eyes: No scleral injection.  HENT: No OP obstruction.  Head: Normocephalic.  Cardiovascular: Normal rate and regular rhythm.  Respiratory: Effort normal, non-labored breathing.  GI: Soft.  No distension. There is no tenderness.  Skin: WDI.   Neurologic Examination   Neuro: *MS: A&O x4. Follows multi-step commands.  She is oriented to month and year *Speech: no dysarthria or aphasia, able to name and repeat. *CN:    I: Deferred   II,III: PERRLA, VFF by confrontation, optic discs not visualized 2/2 pupillary constriction   III,IV,VI: EOMI w/o nystagmus, no ptosis   V: Sensation intact from V1 to V3 to LT   VII: Eyelid closure was full.  Smile symmetric.   VIII: Hearing intact to voice   IX,X: Voice normal, palate elevates symmetrically    XI: SCM/trap 5/5 bilat   XII: Tongue protrudes midline, no atrophy or fasciculations  *Motor:   Normal bulk.  No tremor, rigidity or bradykinesia. No pronator drift.   Strength: Dlt Bic Tri WE WrF FgS Gr HF KnF KnE PlF DoF    Left 4 4 4 5 5 5 3 5 4 4 5 5     Right 4 4 4 5 5 5 4 5 5 5 5 5    *Sensory: Intact to light touch *Coordination:  Finger-to-nose, heel-to-shin, rapid alternating motions were intact. *Gait: deferred   Labs/Imaging/Neurodiagnostic studies   CBC:  Recent Labs  Lab 07/03/24 1507 06/15/24 0604  WBC 18.6* 19.9*  NEUTROABS 15.2*  --   HGB 10.1* 9.7*  HCT 29.5* 27.7*  MCV 76.8* 75.5*  PLT 302 237   Basic Metabolic Panel:  Lab Results  Component Value Date   NA 133 (L) 06/15/2024   K 4.8 06/15/2024   CO2 13 (L) 06/15/2024   GLUCOSE 112 (H) 06/15/2024   BUN 95 (H) 06/15/2024   CREATININE 7.51 (H) 06/15/2024   CALCIUM  8.3 (L) 06/15/2024   GFRNONAA 5 (L) 06/15/2024   GFRAA 81 06/21/2020   Lipid Panel:  Lab Results  Component Value Date   LDLCALC NOT  CALCULATED 06/15/2024   HgbA1c:  Lab Results  Component Value Date   HGBA1C 6.6 (H) 06/15/2024   Urine Drug Screen:     Component Value Date/Time   LABOPIA NONE DETECTED 03/26/2024 1313   COCAINSCRNUR NONE DETECTED 03/26/2024 1313   LABBENZ NONE DETECTED 03/26/2024 1313   AMPHETMU NONE DETECTED 03/26/2024 1313   THCU NONE DETECTED 03/26/2024 1313   LABBARB NONE DETECTED 03/26/2024 1313    Alcohol  Level     Component Value Date/Time   ETH <  15 03/26/2024 1100   INR  Lab Results  Component Value Date   INR 1.3 (H) 06/14/2024      MRI Brain(Personally reviewed): 1. Punctate acute left pontine infarct. 2. Moderate chronic small vessel ischemic disease.   ASSESSMENT   Sara [name] is a 83 y.o. female who presented to the emergency department at [facility], found to be septic, in acute renal failure, and found to have a new left pontine stroke. Likely ischemic in etiology, she doesn't appear to have any focal deficits, besides some mild weakness in her upper and lower extremities. Her speech is also intact, and she responds appropriately to questions. Her symptom onset was on Friday, 8/15 with ataxia and some trouble word finding it seems. CVA is very small, and may have just been an incidental finding.  Will continue with aspirin  81mg , will hold off on CT angio given her renal failure currently.   RECOMMENDATIONS   - Continue Aspirin  81mg  daily  - Follow-up echocardiogram, please call if there is any suspicion for an embolic source found on echo - Her LDL is already less than 70, no need for more aggressive lipid management - A1c of 6.6, continue to monitor -BP appears well-controlled, goal of normotension - Continue PT/OT  - Neurology will be available as needed, please call with further questions or concerns  ______________________________________________________________________   Signed, Roetta Chars, MD Internal Medicine Resident  I have seen the  patient reviewed the above note.  On the axial, but not coronal images there is a very small diffusion abnormality in the pons, but I think this could potentially be artifactual.  In any case, if it is real I think it is likely incidental though could contribute to some unsteadiness.  My suspicion is that her presentation is more due to her acute kidney issues and infection.  Given the time of onset is unclear, I do not think that DAPT is necessary.  I would continue with aspirin  monotherapy.  Sara Seals, MD [facility]   If 7pm- 7am, please page neurology on call as listed in AMION.

## 2024-06-15 NOTE — Evaluation (Signed)
 Occupational Therapy Evaluation Patient Details Name: Sara [name] MRN: [id number] DOB: December 14, 1940 Today's Date: 06/15/2024   History of Present Illness   Pt is an 83 y/o F admitted on 06/14/24 after presenting for evaluation of gait imbalance x 2-3 days. MRI brain without contrast showed punctate acute left pontine infarct & moderate chronic small vessel ischemic disease. Pt is also being treated for severe sepsis & possible UTI. PMH: CKD 3B, DM2, HTN, HLD, CVA     Clinical Impressions PTA, pt lived with family and per pt and family report, pt was independent in ADL and IADL within the home; pt does not drive. Upon eval, pt needing up to min A and mod cues for safety with RW use when OOB, bumping into various obstacles on L and R sides, with generalized weakness, questionable LUE incoordination, poor safety awareness, cognition, balance. Pt also needing max cues to follow simple 2 step commands during testing as well as with some difficulty with verbal expression. Recommend speech consult. Due to significant change in functional status, recommending intensive multidisciplinary rehabilitation >3 hours/day to optimize safety and independence in ADL.       If plan is discharge home, recommend the following:   A little help with walking and/or transfers;A little help with bathing/dressing/bathroom;Assistance with cooking/housework;Assist for transportation;Help with stairs or ramp for entrance;Supervision due to cognitive status     Functional Status Assessment   Patient has had a recent decline in their functional status and demonstrates the ability to make significant improvements in function in a reasonable and predictable amount of time.     Equipment Recommendations   Other (comment) (defer)     Recommendations for Other Services   Speech consult;Rehab consult     Precautions/Restrictions   Precautions Precautions: Fall Restrictions Weight Bearing Restrictions Per  Provider Order: No     Mobility Bed Mobility               General bed mobility comments: OOB in chair    Transfers Overall transfer level: Needs assistance Equipment used: Rolling walker (2 wheels) Transfers: Sit to/from Stand Sit to Stand: Min assist           General transfer comment: cuing re: hand placement with RW, assistance to power up from low surface      Balance Overall balance assessment: Needs assistance Sitting-balance support: Feet supported Sitting balance-Leahy Scale: Fair Sitting balance - Comments: supervision static sitting EOB   Standing balance support: Bilateral upper extremity supported, Reliant on assistive device for balance, During functional activity Standing balance-Leahy Scale: Fair                             ADL either performed or assessed with clinical judgement   ADL Overall ADL's : Needs assistance/impaired Eating/Feeding: Set up;Sitting   Grooming: Minimal assistance;Standing;Wash/dry face   Upper Body Bathing: Set up;Sitting   Lower Body Bathing: Minimal assistance;Sit to/from stand   Upper Body Dressing : Set up;Sitting   Lower Body Dressing: Minimal assistance;Sit to/from stand   Toilet Transfer: Minimal assistance;Cueing for safety;Ambulation;Rolling walker (2 wheels);Regular Toilet;Grab bars   Toileting- Clothing Manipulation and Hygiene: Minimal assistance;Sit to/from stand       Functional mobility during ADLs: Rolling walker (2 wheels);Minimal assistance       Vision Ability to See in Adequate Light: 0 Adequate Patient Visual Report: No change from baseline Vision Assessment?: Vision impaired- to be further tested in functional context Additional Comments:  bumping into obstacles on L and R; undershooting first 2 attempts with finger to nose, however, some mild LUE deficits as well     Perception         Praxis         Pertinent Vitals/Pain Pain Assessment Pain Assessment: No/denies  pain     Extremity/Trunk Assessment Upper Extremity Assessment Upper Extremity Assessment: Generalized weakness;Difficult to assess due to impaired cognition (strength testing WFL. pt needing dense cues for coordination testing, appears to have some mild decreased coordination on the L as compared to R)   Lower Extremity Assessment Lower Extremity Assessment: Defer to PT evaluation       Communication Communication Communication: Impaired Factors Affecting Communication: Hearing impaired;Difficulty expressing self   Cognition Arousal: Alert Behavior During Therapy: WFL for tasks assessed/performed Cognition: Cognition impaired   Orientation impairments: Time, Situation, Place Awareness: Intellectual awareness impaired, Online awareness impaired Memory impairment (select all impairments): Short-term memory, Declarative long-term memory Attention impairment (select first level of impairment): Focused attention Executive functioning impairment (select all impairments): Organization, Sequencing, Problem solving, Reasoning OT - Cognition Comments: pt needing mod cues for safety with environmental navigation with use of RW; also with BM standing at sink and no awareness.                 Following commands: Impaired Following commands impaired: Follows one step commands with increased time, Follows one step commands inconsistently     Cueing  General Comments   Cueing Techniques: Verbal cues;Gestural cues;Tactile cues;Visual cues  HR max 130   Exercises     Shoulder Instructions      Home Living Family/patient expects to be discharged to:: Private residence Living Arrangements: Children Available Help at Discharge: Family Type of Home: House Home Access: Stairs to enter Secretary/administrator of Steps: 1   Home Layout: One level     Bathroom Shower/Tub: Chief Strategy Officer: Standard Bathroom Accessibility: Yes How Accessible: Accessible via  walker Home Equipment: Rollator (4 wheels)          Prior Functioning/Environment Prior Level of Function : Independent/Modified Independent             Mobility Comments: Ambulatory with rollator, denies falls. ADLs Comments: Bathes & dresses without assistance, cooks (cooked ribs for family last weekend).    OT Problem List: Decreased strength;Decreased activity tolerance;Impaired balance (sitting and/or standing);Decreased cognition;Decreased safety awareness;Decreased knowledge of use of DME or AE   OT Treatment/Interventions: Self-care/ADL training;Therapeutic exercise;DME and/or AE instruction;Therapeutic activities;Patient/family education;Balance training      OT Goals(Current goals can be found in the care plan section)   Acute Rehab OT Goals Patient Stated Goal: per daughter, get better OT Goal Formulation: With patient/family Time For Goal Achievement: 06/29/24 Potential to Achieve Goals: Good   OT Frequency:  Min 2X/week    Co-evaluation              AM-PAC OT 6 Clicks Daily Activity     Outcome Measure Help from another person eating meals?: None Help from another person taking care of personal grooming?: A Little Help from another person toileting, which includes using toliet, bedpan, or urinal?: A Little Help from another person bathing (including washing, rinsing, drying)?: A Little Help from another person to put on and taking off regular upper body clothing?: A Little Help from another person to put on and taking off regular lower body clothing?: A Little 6 Click Score: 19   End of Session Equipment Utilized During  Treatment: Gait belt;Rolling walker (2 wheels) Nurse Communication: Mobility status  Activity Tolerance: Patient tolerated treatment well Patient left: in chair;with call bell/phone within reach;with chair alarm set;with family/visitor present  OT Visit Diagnosis: Unsteadiness on feet (R26.81);Muscle weakness (generalized)  (M62.81);Other symptoms and signs involving cognitive function;Cognitive communication deficit (R41.841)                Time: 8383-8354 OT Time Calculation (min): 29 min Charges:  OT General Charges $OT Visit: 1 Visit OT Evaluation $OT Eval Moderate Complexity: 1 Mod OT Treatments $Self Care/Home Management : 8-22 mins  Sara [name] FREDERICK, OTR/L [facility] Acute Rehabilitation Office: [phone]   Sara [name] 06/15/2024, 5:14 PM

## 2024-06-15 NOTE — Progress Notes (Signed)
 PHARMACY - PHYSICIAN COMMUNICATION CRITICAL VALUE ALERT - BLOOD CULTURE IDENTIFICATION (BCID)  Sara [name] is an 83 y.o. female who presented to [facility] on 06/14/2024 with a chief complaint of imbalance  Assessment:  3/3 blood culture bottles - kleb pneumo bacteremia with no detected resistance  Name of physician (or Provider) Contacted: Donnamarie  Current antibiotics: rocephin  2g q24  Changes to prescribed antibiotics recommended:  Patient is on recommended antibiotics - No changes needed  Results for orders placed or performed during the hospital encounter of 06/14/24  Blood Culture ID Panel (Reflexed) (Collected: 06/14/2024  4:49 PM)  Result Value Ref Range   Enterococcus faecalis NOT DETECTED NOT DETECTED   Enterococcus Faecium NOT DETECTED NOT DETECTED   Listeria monocytogenes NOT DETECTED NOT DETECTED   Staphylococcus species NOT DETECTED NOT DETECTED   Staphylococcus aureus (BCID) NOT DETECTED NOT DETECTED   Staphylococcus epidermidis NOT DETECTED NOT DETECTED   Staphylococcus lugdunensis NOT DETECTED NOT DETECTED   Streptococcus species NOT DETECTED NOT DETECTED   Streptococcus agalactiae NOT DETECTED NOT DETECTED   Streptococcus pneumoniae NOT DETECTED NOT DETECTED   Streptococcus pyogenes NOT DETECTED NOT DETECTED   A.calcoaceticus-baumannii NOT DETECTED NOT DETECTED   Bacteroides fragilis NOT DETECTED NOT DETECTED   Enterobacterales DETECTED (A) NOT DETECTED   Enterobacter cloacae complex NOT DETECTED NOT DETECTED   Escherichia coli NOT DETECTED NOT DETECTED   Klebsiella aerogenes NOT DETECTED NOT DETECTED   Klebsiella oxytoca NOT DETECTED NOT DETECTED   Klebsiella pneumoniae DETECTED (A) NOT DETECTED   Proteus species NOT DETECTED NOT DETECTED   Salmonella species NOT DETECTED NOT DETECTED   Serratia marcescens NOT DETECTED NOT DETECTED   Haemophilus influenzae NOT DETECTED NOT DETECTED   Neisseria meningitidis NOT DETECTED NOT DETECTED   Pseudomonas  aeruginosa NOT DETECTED NOT DETECTED   Stenotrophomonas maltophilia NOT DETECTED NOT DETECTED   Candida albicans NOT DETECTED NOT DETECTED   Candida auris NOT DETECTED NOT DETECTED   Candida glabrata NOT DETECTED NOT DETECTED   Candida krusei NOT DETECTED NOT DETECTED   Candida parapsilosis NOT DETECTED NOT DETECTED   Candida tropicalis NOT DETECTED NOT DETECTED   Cryptococcus neoformans/gattii NOT DETECTED NOT DETECTED   CTX-M ESBL NOT DETECTED NOT DETECTED   Carbapenem resistance IMP NOT DETECTED NOT DETECTED   Carbapenem resistance KPC NOT DETECTED NOT DETECTED   Carbapenem resistance NDM NOT DETECTED NOT DETECTED   Carbapenem resist OXA 48 LIKE NOT DETECTED NOT DETECTED   Carbapenem resistance VIM NOT DETECTED NOT DETECTED    Britta Eva Na 06/15/2024  8:33 AM

## 2024-06-15 NOTE — Progress Notes (Signed)
 [facility] Progress Note  Subjective:  Seen in ED Pt feeling fine, no c/o's Good UOP, but not recorded, per family Creat down 7s  Vitals:   06/14/24 2357 06/15/24 0230 06/15/24 0500 06/15/24 0615  BP:  (!) 158/67 138/72 136/69  Pulse:  (!) 109 (!) 118 (!) 116  Resp:  18 17 18   Temp: 98.6 F (37 C) 98.5 F (36.9 C)  98.5 F (36.9 C)  TempSrc: Oral     SpO2:  100% 100% 100%  Weight:      Height:        Exam: Gen alert, no distress, elderly female throat clear and moist No jvd or bruits Chest clear bilat to bases, no rales/ wheezing RRR no MRG Abd soft ntnd no mass or ascites +bs GU foley placed draining large amts of cloudy w/ blood tinged sediment Ext no LE or UE edema Neuro is alert, Ox 3 , nf     Home bp meds: Hydrochlorothiazide  Others: lipitor, nexium , megace  (not taking), remeron , prns       Date                             Creat               eGFR (ml/min) 2019- 2022                  0.69- 1.12 2023                            0.73- 2.13 2024                            0.85- 1.00        57- 69 ml/min May 2025                    2.24 >> 1.52    21- 34 ml/min June 2025                   1.75- 2.91        15- 29 ml/min 06/14/24                        9.52                 4 ml/min       UA: mod Hb, small LE, prot 100, >50 rbc/ >50 wbc/ 0-5 epi UNa, UCr: pend Renal US : 10.9/ 9/9 cm kidneys w/o hydro, ^echotexture   Assessment/ Plan: AKI on CKD 3b: b/l creatinine 1.5- 1.8 from may-June 2025, eGFR 29- 34 ml/min.  Creatinine was 9.52 on admit in the setting of urinary retention, probable UTI, vol depletion, fatigue. Foley placed w/ 650 cc urine output, suggestive of bladder retention. UA appeared infected, renal US  w/o obstruction. Pt rec'd 3 L NS bolus in ED and IVF's at 150 cc/hr.  AKI likely due to urinary retention + vol depletion + UTI/ mild sepsis. Cont foley cath, IVFs. Creat down mid 7s today, improving. Will follow.  Hyperkalemia:  resolved, K+ < 5 HTN: on hydrochlorothiazide  at home. Hold for now.  Met acidosis: cont bicarb gtt at 150 /hr.          Myer Fret MD  [facility] 06/15/2024, 9:37 AM  Recent Labs  Lab 06/14/24 1507 06/14/24 1620 06/15/24  0604  HGB 10.1*  --  9.7*  ALBUMIN  --  2.8* 1.9*  CALCIUM  9.4 9.1 8.3*  PHOS  --   --  4.0  CREATININE 9.52* 9.40* 7.51*  K 6.4* 6.0* 4.8   No results for input(s): IRON, TIBC, FERRITIN in the last 168 hours. Inpatient medications:   stroke: early stages of recovery book   Does not apply Once   aspirin  EC  81 mg Oral Daily   atorvastatin   40 mg Oral QHS   heparin   5,000 Units Subcutaneous Q8H   insulin  aspart  0-9 Units Subcutaneous TID WC    cefTRIAXone  (ROCEPHIN )  IV     lactated ringers      lactated ringers  Stopped (06/15/24 0256)   sodium bicarbonate  150 mEq in sterile water  1,150 mL infusion 150 mL/hr at 06/15/24 0645   acetaminophen  **OR** acetaminophen  (TYLENOL ) oral liquid 160 mg/5 mL **OR** acetaminophen , ondansetron  (ZOFRAN ) IV, senna-docusate

## 2024-06-15 NOTE — Evaluation (Signed)
 Physical Therapy Evaluation Patient Details Name: Sara [name] MRN: [id number] DOB: 1941/10/24 Today's Date: 06/15/2024  History of Present Illness  Pt is an 83 y/o F admitted on 06/14/24 after presenting for evaluation of gait imbalance x 2-3 days. MRI brain without contrast showed punctate acute left pontine infarct & moderate chronic small vessel ischemic disease. Pt is also being treated for severe sepsis & possible UTI. PMH: CKD 3B, DM2, HTN, HLD, CVA  Clinical Impression  Pt seen for PT evaluation with pt agreeable, daughter [facility]) present for session. Prior to admission, pt was mod I with rollator, denies falls, cooking meals for her family, bathing & dressing without assistance. On this date, pt presents with impaired cognition (orientation, awareness, problem solving). Pt requires CGA<>Min assist for gait with RW with overall distance limited by fatigue. Pt would benefit from ongoing skilled PT treatment to progress gait, balance, endurance, & awareness to reduce fall risk & increase safety with mobility. Pt would benefit from post acute rehab >3 therapy/day upon d/c to facilitate return to mod I level.        If plan is discharge home, recommend the following: A little help with walking and/or transfers;Assistance with cooking/housework;A little help with bathing/dressing/bathroom;Assist for transportation;Help with stairs or ramp for entrance;Direct supervision/assist for financial management;Supervision due to cognitive status;Direct supervision/assist for medications management   Can travel by private vehicle        Equipment Recommendations  (possibly none, pending progress, pt already has rollator)  Recommendations for Other Services  Rehab consult    Functional Status Assessment Patient has had a recent decline in their functional status and demonstrates the ability to make significant improvements in function in a reasonable and predictable amount of time.      Precautions / Restrictions Precautions Precautions: Fall Restrictions Weight Bearing Restrictions Per Provider Order: No      Mobility  Bed Mobility Overal bed mobility: Needs Assistance Bed Mobility: Supine to Sit     Supine to sit: Min assist, Contact guard, Used rails, HOB elevated (exit L side of bed)          Transfers Overall transfer level: Needs assistance Equipment used: Rolling walker (2 wheels) Transfers: Sit to/from Stand Sit to Stand: Min assist           General transfer comment: cuing re: hand placement with RW, assistance to power up from low surface    Ambulation/Gait Ambulation/Gait assistance: Contact guard assist, Min assist Gait Distance (Feet): 50 Feet Assistive device: Rolling walker (2 wheels) Gait Pattern/deviations: Decreased step length - right, Decreased stride length, Decreased step length - left Gait velocity: decreased     General Gait Details: Cuing to ambulate within base of AD as pt standing slightly to R of walker, extra time to maneuver RW around obstacles.  Stairs            Wheelchair Mobility     Tilt Bed    Modified Rankin (Stroke Patients Only)       Balance Overall balance assessment: Needs assistance Sitting-balance support: Feet supported Sitting balance-Leahy Scale: Fair Sitting balance - Comments: supervision static sitting EOB   Standing balance support: Bilateral upper extremity supported, Reliant on assistive device for balance, During functional activity Standing balance-Leahy Scale: Fair                               Pertinent Vitals/Pain Pain Assessment Pain Assessment: No/denies pain  Home Living Family/patient expects to be discharged to:: Private residence Living Arrangements: Children Available Help at Discharge: Family Type of Home: House Home Access: Stairs to enter   Secretary/administrator of Steps: 1   Home Layout: One level Home Equipment: Rollator (4  wheels)      Prior Function               Mobility Comments: Ambulatory with rollator, denies falls. ADLs Comments: Bathes & dresses without assistance, cooks (cooked ribs for family last weekend).     Extremity/Trunk Assessment   Upper Extremity Assessment Upper Extremity Assessment: Defer to OT evaluation;Generalized weakness;Right hand dominant (decreased ability to assess BUE opposition 2/2 impaired cognition, decreased coordination in LUE but question if this is more likely 2/2 cognitive deficits than physical issues)    Lower Extremity Assessment Lower Extremity Assessment: Generalized weakness (BLE sensation to light touch equal & intact, BLE proprioceptio intact)       Communication        Cognition Arousal: Alert Behavior During Therapy: WFL for tasks assessed/performed   PT - Cognitive impairments: Orientation, Awareness, Memory, Initiation, Attention, Sequencing, Problem solving, Safety/Judgement                       PT - Cognition Comments: Oriented to self, year, uses calendar in room to recall the date, but unable to state what the 8th month is. Poor ability to follow multimodal cuing to test for BUE opposition, sensation in BLE. Following commands: Impaired Following commands impaired: Follows one step commands with increased time, Follows one step commands inconsistently     Cueing Cueing Techniques: Verbal cues, Gestural cues, Tactile cues, Visual cues     General Comments General comments (skin integrity, edema, etc.): max HR 128 bpm; pt reports swallowing issues but daughter reports this has been ongoing for a couple years & had a swallow test completed a couple weeks ago    Exercises     Assessment/Plan    PT Assessment Patient needs continued PT services  PT Problem List Decreased strength;Decreased cognition;Decreased range of motion;Decreased coordination;Decreased activity tolerance;Decreased balance;Decreased mobility;Decreased  safety awareness;Decreased knowledge of use of DME       PT Treatment Interventions DME instruction;Balance training;Gait training;Neuromuscular re-education;Cognitive remediation;Stair training;Functional mobility training;Patient/family education;Therapeutic activities;Therapeutic exercise;Modalities    PT Goals (Current goals can be found in the Care Plan section)  Acute Rehab PT Goals Patient Stated Goal: get better PT Goal Formulation: With patient/family Time For Goal Achievement: 06/29/24 Potential to Achieve Goals: Good    Frequency Min 3X/week     Co-evaluation               AM-PAC PT 6 Clicks Mobility  Outcome Measure Help needed turning from your back to your side while in a flat bed without using bedrails?: A Little Help needed moving from lying on your back to sitting on the side of a flat bed without using bedrails?: A Little Help needed moving to and from a bed to a chair (including a wheelchair)?: A Little Help needed standing up from a chair using your arms (e.g., wheelchair or bedside chair)?: A Little Help needed to walk in hospital room?: A Little Help needed climbing 3-5 steps with a railing? : A Lot 6 Click Score: 17    End of Session   Activity Tolerance: Patient tolerated treatment well;Patient limited by fatigue Patient left: in chair;with chair alarm set;with call bell/phone within reach Nurse Communication: Mobility status PT Visit Diagnosis:  Muscle weakness (generalized) (M62.81);Difficulty in walking, not elsewhere classified (R26.2);Other abnormalities of gait and mobility (R26.89);Unsteadiness on feet (R26.81)    Time: 8585-8565 PT Time Calculation (min) (ACUTE ONLY): 20 min   Charges:   PT Evaluation $PT Eval Moderate Complexity: 1 Mod   PT General Charges $$ ACUTE PT VISIT: 1 Visit         Richerd Pinal, PT, DPT 06/15/24, 2:45 PM   Richerd CHRISTELLA Pinal 06/15/2024, 2:43 PM

## 2024-06-15 NOTE — Progress Notes (Signed)
*  PRELIMINARY RESULTS* Echocardiogram 2D Echocardiogram has been performed.  Sara [name] 06/15/2024, 2:20 PM

## 2024-06-15 NOTE — Progress Notes (Signed)
 PROGRESS NOTE  Sara [name] FMW:[id number] DOB: May 26, 1941 DOA: 06/14/2024 PCP: Campbell Reynolds, NP  HPI/Recap of past 24 hours: Sara [name] is a 83 y.o. female with medical history significant for CKD stage IIIb, T2DM, HTN, HLD who presents to the ED for evaluation of gait imbalance for 2-3 days. She has been having to brace herself on the walls and with furniture in the house, no falls. Pt reports burning sensation with urination, denies fever/chills. Noted dysphagia and poor oral intake which has led to several recent admissions for AKI. In the ED, VS fairly stable. Labs showed WBC 18.6, hemoglobin 10.1, platelets 302, sodium 130, potassium 6.4, bicarb 13, BUN 130, creatinine 9.52, serum glucose 323, lactic acid 3.9 > 2.3. Urinalysis showed negative nitrates, small leukocytes, >50 RBCs and WBCs, few bacteria. Portable chest x-ray negative for focal consolidation, edema, effusion. MRI brain without contrast showed punctate acute left pontine infarct and moderate chronic small vessel ischemic disease.  Nephrology consulted.  Neurology consulted, request transfer to [facility] for further management.  [facility] admitted for further management.     Today, patient denies any new complaints, alert/awake/oriented.  Her daughter and son were at bedside in the ED.  Discussed extensively, all questions answered.    Assessment/Plan: Principal Problem:   Severe sepsis with lactic acidosis (HCC) Active Problems:   Acute renal failure superimposed on stage 3b chronic kidney disease (HCC)   Hypertension associated with diabetes (HCC)   Left pontine stroke (HCC)   Type 2 diabetes mellitus with chronic kidney disease, without long-term current use of insulin  (HCC)   Hyperkalemia   Acute urinary retention   Hyperlipidemia associated with type 2 diabetes mellitus (HCC)   Severe sepsis due to Klebsiella pneumonia bacteremia Possible UTI Patient presenting with leukocytosis, tachycardia,  lactic acidosis, AKI Currently afebrile with leukocytosis Lactic acid 3.9--2.3 UA showed negative nitrites, small leukocytes, greater than 50 WBCs, few bacteria, urine culture pending BC x 2 growing Klebsiella pneumonia, pending susceptibilities De-escalated to 2 g of IV ceftriaxone  Continue IV fluids Monitor closely   AKI on CKD stage IIIb-improving Metabolic acidosis BUN 130 and creatinine 9.52 on admission.  Recent baseline creatinine has been 1.5-1.8 Likely multifactorial in setting of urinary retention, volume depletion, sepsis physiology, and medication effect Renal ultrasound negative for obstruction/hydronephrosis; shows increased cortical echogenicity bilaterally Continue IV fluid Nephrology on board, appreciate recs Hold home Lasix, plan to discontinue hydrochlorothiazide  upon discharge Continue Foley, monitor strict I/O's Daily renal panel  Acute urinary retention Foley catheter placed in ED with 650 cc UOP Continue Foley  Hyperkalemia Resolved S/p Lokelma    Acute left pontine infarct Patient reports recent gait imbalance Punctate acute left pontine infarct seen on MRI brain Echo pending LDL-->not calculated, hemoglobin A1c--> 6.6 Continue ASA, Lipitor Neurology consulted Keep on telemetry, continue neurochecks PT/OT/SLP eval  Type 2 diabetes with hyperglycemia Last A1c 6.6 Placed on SSI  Microcytic anemia Anemia of chronic kidney disease Hemoglobin around baseline Daily CBC   Hypertension Holding Lasix and HCTZ.   Hyperlipidemia Continue atorvastatin   Hypoalbuminemia Nutrition consult    Estimated body mass index is 17.19 kg/m as calculated from the following:   Height as of this encounter: 5' 1 (1.549 m).   Weight as of this encounter: 41.3 kg.     Code Status: Full  Family Communication: Discussed with son and daughter at bedside  Disposition Plan: Status is: Inpatient Remains inpatient appropriate because: Level of  care      Consultants: Nephrology Neurology  Procedures: None  Antimicrobials: Ceftriaxone   DVT prophylaxis: Heparin  Sara [name]   Objective: Vitals:   06/14/24 2357 06/15/24 0230 06/15/24 0500 06/15/24 0615  BP:  (!) 158/67 138/72 136/69  Pulse:  (!) 109 (!) 118 (!) 116  Resp:  18 17 18   Temp: 98.6 F (37 C) 98.5 F (36.9 C)  98.5 F (36.9 C)  TempSrc: Oral     SpO2:  100% 100% 100%  Weight:      Height:        Intake/Output Summary (Last 24 hours) at 06/15/2024 1032 Last data filed at 06/14/2024 1740 Gross per 24 hour  Intake 2000 ml  Output --  Net 2000 ml   Filed Weights   06/14/24 1358  Weight: 41.3 kg    Exam: General: NAD  Cardiovascular: S1, S2 present Respiratory: CTAB Abdomen: Soft, nontender, nondistended, bowel sounds present Musculoskeletal: No bilateral pedal edema noted Skin: Normal Psychiatry: Normal mood  Neurology: Strength/sensation equal in all extremities, no obvious focal neurologic deficits.  Gait was not assessed    Data Reviewed: CBC: Recent Labs  Lab 06/14/24 1507 06/15/24 0604  WBC 18.6* 19.9*  NEUTROABS 15.2*  --   HGB 10.1* 9.7*  HCT 29.5* 27.7*  MCV 76.8* 75.5*  PLT 302 237   Basic Metabolic Panel: Recent Labs  Lab 06/14/24 1507 06/14/24 1620 06/15/24 0604  NA 130* 129* 133*  K 6.4* 6.0* 4.8  CL 96* 98 107  CO2 13* 13* 13*  GLUCOSE 323* 274* 112*  BUN 130* 135* 95*  CREATININE 9.52* 9.40* 7.51*  CALCIUM  9.4 9.1 8.3*  PHOS  --   --  4.0   GFR: Estimated Creatinine Clearance: 3.7 mL/min (A) (by C-G formula based on SCr of 7.51 mg/dL (H)). Liver Function Tests: Recent Labs  Lab 06/14/24 1620 06/15/24 0604  AST 52*  --   ALT 31  --   ALKPHOS 165*  --   BILITOT 1.1  --   PROT 8.2*  --   ALBUMIN 2.8* 1.9*   No results for input(s): LIPASE, AMYLASE in the last 168 hours. No results for input(s): AMMONIA in the last 168 hours. Coagulation Profile: Recent Labs  Lab 06/14/24 1619  INR 1.3*    Cardiac Enzymes: No results for input(s): CKTOTAL, CKMB, CKMBINDEX, TROPONINI in the last 168 hours. BNP (last 3 results) No results for input(s): PROBNP in the last 8760 hours. HbA1C: Recent Labs    06/15/24 0605  HGBA1C 6.6*   CBG: Recent Labs  Lab 06/15/24 0725  GLUCAP 97   Lipid Profile: Recent Labs    06/15/24 0604  CHOL 60  HDL <10*  LDLCALC NOT CALCULATED  TRIG 69  CHOLHDL NOT CALCULATED   Thyroid Function Tests: No results for input(s): TSH, T4TOTAL, FREET4, T3FREE, THYROIDAB in the last 72 hours. Anemia Panel: No results for input(s): VITAMINB12, FOLATE, FERRITIN, TIBC, IRON, RETICCTPCT in the last 72 hours. Urine analysis:    Component Value Date/Time   COLORURINE AMBER (A) 06/14/2024 1726   APPEARANCEUR TURBID (A) 06/14/2024 1726   LABSPEC 1.014 06/14/2024 1726   PHURINE 5.0 06/14/2024 1726   GLUCOSEU 150 (A) 06/14/2024 1726   HGBUR MODERATE (A) 06/14/2024 1726   BILIRUBINUR NEGATIVE 06/14/2024 1726   BILIRUBINUR negative 06/01/2022 1010   KETONESUR NEGATIVE 06/14/2024 1726   PROTEINUR 100 (A) 06/14/2024 1726   UROBILINOGEN 0.2 06/01/2022 1010   NITRITE NEGATIVE 06/14/2024 1726   LEUKOCYTESUR SMALL (A) 06/14/2024 1726   Sepsis Labs: @LABRCNTIP (procalcitonin:4,lacticidven:4)  ) Recent Results (from  the past 240 hours)  Blood Culture (routine x 2)     Status: None (Preliminary result)   Collection Time: 06/14/24  4:19 PM   Specimen: BLOOD RIGHT FOREARM  Result Value Ref Range Status   Specimen Description   Final    BLOOD RIGHT FOREARM Performed at [facility], [address] [place], [place] [address]    Special Requests   Final    BOTTLES DRAWN AEROBIC AND ANAEROBIC Blood Culture adequate volume Performed at [facility], [address] [place], [place] [address]    Culture  Setup Time   Final    GRAM NEGATIVE RODS IN BOTH AEROBIC AND ANAEROBIC BOTTLES CRITICAL VALUE NOTED.   VALUE IS CONSISTENT WITH PREVIOUSLY REPORTED AND CALLED VALUE. Performed at [facility], [address] [place], [place] [address]    Culture GRAM NEGATIVE RODS  Final   Report Status PENDING  Incomplete  Blood Culture (routine x 2)     Status: None (Preliminary result)   Collection Time: 06/14/24  4:49 PM   Specimen: Left Antecubital; Blood  Result Value Ref Range Status   Specimen Description LEFT ANTECUBITAL BLOOD  Final   Special Requests   Final    Blood Culture adequate volume BOTTLES DRAWN AEROBIC ONLY   Culture  Setup Time   Final    GRAM NEGATIVE RODS AEROBIC BOTTLE ONLY CRITICAL RESULT CALLED TO, READ BACK BY AND VERIFIED WITH: PHARMD J.LEGGE AT 0830 ON 06/15/2024 BY T.SAAD. Performed at [facility], [address] [place], [place] [address]    Culture GRAM NEGATIVE RODS  Final   Report Status PENDING  Incomplete  Blood Culture ID Panel (Reflexed)     Status: Abnormal   Collection Time: 06/14/24  4:49 PM  Result Value Ref Range Status   Enterococcus faecalis NOT DETECTED NOT DETECTED Final   Enterococcus Faecium NOT DETECTED NOT DETECTED Final   Listeria monocytogenes NOT DETECTED NOT DETECTED Final   Staphylococcus species NOT DETECTED NOT DETECTED Final   Staphylococcus aureus (BCID) NOT DETECTED NOT DETECTED Final   Staphylococcus epidermidis NOT DETECTED NOT DETECTED Final   Staphylococcus lugdunensis NOT DETECTED NOT DETECTED Final   Streptococcus species NOT DETECTED NOT DETECTED Final   Streptococcus agalactiae NOT DETECTED NOT DETECTED Final   Streptococcus pneumoniae NOT DETECTED NOT DETECTED Final   Streptococcus pyogenes NOT DETECTED NOT DETECTED Final   A.calcoaceticus-baumannii NOT DETECTED NOT DETECTED Final   Bacteroides fragilis NOT DETECTED NOT DETECTED Final   Enterobacterales DETECTED (A) NOT DETECTED Final    Comment: Enterobacterales represent a large order of gram negative bacteria, not a single organism. CRITICAL RESULT CALLED TO,  READ BACK BY AND VERIFIED WITH: PHARMD J.LEGGE AT 0830 ON 06/15/2024 BY T.SAAD.    Enterobacter cloacae complex NOT DETECTED NOT DETECTED Final   Escherichia coli NOT DETECTED NOT DETECTED Final   Klebsiella aerogenes NOT DETECTED NOT DETECTED Final   Klebsiella oxytoca NOT DETECTED NOT DETECTED Final   Klebsiella pneumoniae DETECTED (A) NOT DETECTED Final    Comment: CRITICAL RESULT CALLED TO, READ BACK BY AND VERIFIED WITH: PHARMD J.LEGGE AT 0830 ON 06/15/2024 BY T.SAAD.    Proteus species NOT DETECTED NOT DETECTED Final   Salmonella species NOT DETECTED NOT DETECTED Final   Serratia marcescens NOT DETECTED NOT DETECTED Final   Haemophilus influenzae NOT DETECTED NOT DETECTED Final   Neisseria meningitidis NOT DETECTED NOT DETECTED Final   Pseudomonas aeruginosa NOT DETECTED NOT DETECTED Final  Stenotrophomonas maltophilia NOT DETECTED NOT DETECTED Final   Candida albicans NOT DETECTED NOT DETECTED Final   Candida auris NOT DETECTED NOT DETECTED Final   Candida glabrata NOT DETECTED NOT DETECTED Final   Candida krusei NOT DETECTED NOT DETECTED Final   Candida parapsilosis NOT DETECTED NOT DETECTED Final   Candida tropicalis NOT DETECTED NOT DETECTED Final   Cryptococcus neoformans/gattii NOT DETECTED NOT DETECTED Final   CTX-M ESBL NOT DETECTED NOT DETECTED Final   Carbapenem resistance IMP NOT DETECTED NOT DETECTED Final   Carbapenem resistance KPC NOT DETECTED NOT DETECTED Final   Carbapenem resistance NDM NOT DETECTED NOT DETECTED Final   Carbapenem resist OXA 48 LIKE NOT DETECTED NOT DETECTED Final   Carbapenem resistance VIM NOT DETECTED NOT DETECTED Final    Comment: Performed at [facility], [address], [place], [place] [address]      Studies: US  RENAL Result Date: 06/14/2024 CLINICAL DATA:  Acute renal failure superimposed on stage 3 chronic kidney disease. EXAM: RENAL / URINARY TRACT ULTRASOUND COMPLETE COMPARISON:  Ultrasound 04/24/2024 FINDINGS: Right  Kidney: Renal measurements: 10.9 x 5.7 x 5.5 cm = volume: 179 mL. Increased cortical echogenicity. 1.1 x 1.0 x 0.8 cm cyst. No follow-up recommended. No hydronephrosis. Left Kidney: Renal measurements: 9.9 x 5.2 x 5.6 cm = volume: 152 mL. Increased cortical echogenicity. No mass or hydronephrosis. Bladder: Foley catheter in the decompressed bladder. Other: None. IMPRESSION: Increased cortical echogenicity bilaterally compatible with medical renal disease. No hydronephrosis. Electronically Signed   By: Norman Gatlin M.D.   On: 06/14/2024 21:04   MR BRAIN WO CONTRAST Result Date: 06/14/2024 CLINICAL DATA:  Mental status change, unknown cause. EXAM: MRI HEAD WITHOUT CONTRAST TECHNIQUE: Multiplanar, multiecho pulse sequences of the brain and surrounding structures were obtained without intravenous contrast. COMPARISON:  Head MRI 03/26/2024 FINDINGS: Brain: A punctate acute infarct is noted in the left pons. Patchy T2 hyperintensities in the cerebral white matter bilaterally are similar to the prior MRI and are nonspecific but compatible with moderate chronic small vessel ischemic disease. A chronic lacunar infarct at the posterior aspect of the left putamen is unchanged. There is mild generalized cerebral atrophy. Vascular: Major intracranial vascular flow voids are preserved. Skull and upper cervical spine: Unremarkable bone marrow signal. Sinuses/Orbits: Bilateral cataract extraction. Minimal mucosal thickening in the paranasal sinuses. Trace bilateral mastoid fluid. Other: None. IMPRESSION: 1. Punctate acute left pontine infarct. 2. Moderate chronic small vessel ischemic disease. Electronically Signed   By: Dasie Hamburg M.D.   On: 06/14/2024 19:11   DG Chest Port 1 View Result Date: 06/14/2024 CLINICAL DATA:  Lethargy possible sepsis EXAM: PORTABLE CHEST 1 VIEW COMPARISON:  04/10/2024 FINDINGS: The heart size and mediastinal contours are within normal limits. Both lungs are clear. The visualized skeletal  structures are unremarkable. Aortic atherosclerosis. Multiple skin fold artifact over left chest. IMPRESSION: No active disease. Electronically Signed   By: Luke Bun M.D.   On: 06/14/2024 17:00   DG Knee Complete 4 Views Right Result Date: 06/14/2024 CLINICAL DATA:  Right knee pain EXAM: RIGHT KNEE - COMPLETE 4+ VIEW COMPARISON:  None Available. FINDINGS: No evidence of fracture, dislocation, or joint effusion. No evidence of arthropathy or other focal bone abnormality. Soft tissues are unremarkable. IMPRESSION: No acute osseous findings. Electronically Signed   By: Michaeline Blanch M.D.   On: 06/14/2024 15:12   DG Tibia/Fibula Right Result Date: 06/14/2024 CLINICAL DATA:  Pain EXAM: RIGHT TIBIA AND FIBULA - 2 VIEW COMPARISON:  Same day  knee radiographs FINDINGS: There is no evidence of fracture or other focal bone lesions. Scattered vascular calcifications. No soft tissue swelling. IMPRESSION: No acute osseous findings. Electronically Signed   By: Michaeline Blanch M.D.   On: 06/14/2024 15:11    Scheduled Meds:   stroke: early stages of recovery book   Does not apply Once   aspirin  EC  81 mg Oral Daily   atorvastatin   40 mg Oral QHS   heparin   5,000 Units Subcutaneous Q8H   insulin  aspart  0-9 Units Subcutaneous TID WC    Continuous Infusions:  cefTRIAXone  (ROCEPHIN )  IV     lactated ringers      lactated ringers  Stopped (06/15/24 0256)   sodium bicarbonate  150 mEq in sterile water  1,150 mL infusion 150 mL/hr at 06/15/24 0645     LOS: 1 day     Lebron JINNY Cage, MD [facility]  If 7PM-7AM, please contact night-coverage [email or web address] 06/15/2024, 10:32 AM

## 2024-06-15 NOTE — Progress Notes (Signed)
 Mandatory oral care done before swallow screen test.

## 2024-06-15 NOTE — ED Notes (Addendum)
 Patient can have food and beverage if she passes swallow screen per provider.

## 2024-06-15 NOTE — Progress Notes (Signed)
 OT Cancellation Note  Patient Details Name: Sara [name] MRN: [id number] DOB: Oct 16, 1941   Cancelled Treatment:    Reason Eval/Treat Not Completed: Patient at procedure or test/ unavailable. Orders received, per chart review pt transferring to [facility]. OT will continue to follow and complete eval when medically appropriate.   Timo Hartwig L. Houa Nie, OTR/L  06/15/24, 10:09 AM

## 2024-06-15 NOTE — Progress Notes (Signed)
Inpatient Rehab Admissions Coordinator:   Per therapy recommendations patient was screened for CIR candidacy by Megan Salon, MS, CCC-SLP. At this time, Pt. Appears to be a a potential candidate for CIR. I will place   order for rehab consult per protocol for full assessment. Please contact me any with questions.  Megan Salon, MS, CCC-SLP Rehab Admissions Coordinator  [phone] (celll) [phone] (office)

## 2024-06-16 DIAGNOSIS — N179 Acute kidney failure, unspecified: Secondary | ICD-10-CM | POA: Diagnosis not present

## 2024-06-16 DIAGNOSIS — E1159 Type 2 diabetes mellitus with other circulatory complications: Secondary | ICD-10-CM

## 2024-06-16 DIAGNOSIS — A419 Sepsis, unspecified organism: Secondary | ICD-10-CM | POA: Diagnosis not present

## 2024-06-16 DIAGNOSIS — R6520 Severe sepsis without septic shock: Secondary | ICD-10-CM | POA: Diagnosis not present

## 2024-06-16 DIAGNOSIS — E872 Acidosis, unspecified: Secondary | ICD-10-CM | POA: Diagnosis not present

## 2024-06-16 DIAGNOSIS — I152 Hypertension secondary to endocrine disorders: Secondary | ICD-10-CM

## 2024-06-16 DIAGNOSIS — I639 Cerebral infarction, unspecified: Secondary | ICD-10-CM | POA: Diagnosis not present

## 2024-06-16 LAB — CBC WITH DIFFERENTIAL/PLATELET
Abs Immature Granulocytes: 0.27 K/uL — ABNORMAL HIGH (ref 0.00–0.07)
Basophils Absolute: 0 K/uL (ref 0.0–0.1)
Basophils Relative: 0 %
Eosinophils Absolute: 0.1 K/uL (ref 0.0–0.5)
Eosinophils Relative: 1 %
HCT: 26.2 % — ABNORMAL LOW (ref 36.0–46.0)
Hemoglobin: 9.7 g/dL — ABNORMAL LOW (ref 12.0–15.0)
Immature Granulocytes: 2 %
Lymphocytes Relative: 13 %
Lymphs Abs: 2.4 K/uL (ref 0.7–4.0)
MCH: 26.4 pg (ref 26.0–34.0)
MCHC: 37 g/dL — ABNORMAL HIGH (ref 30.0–36.0)
MCV: 71.4 fL — ABNORMAL LOW (ref 80.0–100.0)
Monocytes Absolute: 1 K/uL (ref 0.1–1.0)
Monocytes Relative: 5 %
Neutro Abs: 14.2 K/uL — ABNORMAL HIGH (ref 1.7–7.7)
Neutrophils Relative %: 79 %
Platelets: 223 K/uL (ref 150–400)
RBC: 3.67 MIL/uL — ABNORMAL LOW (ref 3.87–5.11)
RDW: 14.3 % (ref 11.5–15.5)
WBC: 18 K/uL — ABNORMAL HIGH (ref 4.0–10.5)
nRBC: 0 % (ref 0.0–0.2)

## 2024-06-16 LAB — GLUCOSE, CAPILLARY
Glucose-Capillary: 76 mg/dL (ref 70–99)
Glucose-Capillary: 78 mg/dL (ref 70–99)
Glucose-Capillary: 228 mg/dL — ABNORMAL HIGH (ref 70–99)
Glucose-Capillary: 358 mg/dL — ABNORMAL HIGH (ref 70–99)

## 2024-06-16 LAB — RENAL FUNCTION PANEL
Albumin: 1.6 g/dL — ABNORMAL LOW (ref 3.5–5.0)
Anion gap: 16 — ABNORMAL HIGH (ref 5–15)
BUN: 77 mg/dL — ABNORMAL HIGH (ref 8–23)
CO2: 23 mmol/L (ref 22–32)
Calcium: 7.8 mg/dL — ABNORMAL LOW (ref 8.9–10.3)
Chloride: 97 mmol/L — ABNORMAL LOW (ref 98–111)
Creatinine, Ser: 6.52 mg/dL — ABNORMAL HIGH (ref 0.44–1.00)
GFR, Estimated: 6 mL/min — ABNORMAL LOW (ref >60)
Glucose, Bld: 72 mg/dL (ref 70–99)
Phosphorus: 4 mg/dL (ref 2.5–4.6)
Potassium: 3.5 mmol/L (ref 3.5–5.1)
Sodium: 136 mmol/L (ref 135–145)

## 2024-06-16 LAB — LACTIC ACID, PLASMA: Lactic Acid, Venous: 1.1 mmol/L (ref 0.5–1.9)

## 2024-06-16 MED ORDER — BOOST PLUS PO LIQD
237.0000 mL | Freq: Three times a day (TID) | ORAL | Status: DC
  Administered 2024-06-16 – 2024-06-17 (×3): 237 mL via ORAL
  Filled 2024-06-16 (×4): qty 237

## 2024-06-16 MED ORDER — MELATONIN 5 MG PO TABS
5.0000 mg | ORAL_TABLET | Freq: Every day | ORAL | Status: DC
  Administered 2024-06-16: 5 mg via ORAL
  Filled 2024-06-16: qty 1

## 2024-06-16 MED ORDER — CHLORHEXIDINE GLUCONATE CLOTH 2 % EX PADS
6.0000 | MEDICATED_PAD | Freq: Every day | CUTANEOUS | Status: DC
  Administered 2024-06-16 – 2024-06-17 (×2): 6 via TOPICAL

## 2024-06-16 MED ORDER — THIAMINE MONONITRATE 100 MG PO TABS
100.0000 mg | ORAL_TABLET | Freq: Every day | ORAL | Status: DC
  Administered 2024-06-16 – 2024-06-17 (×2): 100 mg via ORAL
  Filled 2024-06-16 (×2): qty 1

## 2024-06-16 MED ORDER — INSULIN ASPART 100 UNIT/ML IJ SOLN
0.0000 [IU] | Freq: Three times a day (TID) | INTRAMUSCULAR | Status: DC
  Administered 2024-06-16: 2 [IU] via SUBCUTANEOUS
  Administered 2024-06-17: 3 [IU] via SUBCUTANEOUS

## 2024-06-16 MED ORDER — LACTATED RINGERS IV SOLN: INTRAVENOUS | Status: AC

## 2024-06-16 MED ORDER — QUETIAPINE FUMARATE 25 MG PO TABS: 25.0000 mg | ORAL_TABLET | Freq: Every evening | ORAL | Status: DC | PRN

## 2024-06-16 MED ORDER — ADULT MULTIVITAMIN W/MINERALS CH
1.0000 | ORAL_TABLET | Freq: Every day | ORAL | Status: DC
  Administered 2024-06-16 – 2024-06-17 (×2): 1 via ORAL
  Filled 2024-06-16 (×2): qty 1

## 2024-06-16 MED ORDER — TAMSULOSIN HCL 0.4 MG PO CAPS
0.4000 mg | ORAL_CAPSULE | Freq: Every day | ORAL | Status: DC
  Administered 2024-06-16 – 2024-06-17 (×2): 0.4 mg via ORAL
  Filled 2024-06-16 (×2): qty 1

## 2024-06-16 NOTE — Progress Notes (Signed)
 Physical Therapy Treatment Patient Details Name: Sara [name] MRN: [id number] DOB: [birth year] Today's Date: 06/16/2024   History of Present Illness Pt is an 83 y/o F admitted on 06/14/24 after presenting for evaluation of gait imbalance x 2-3 days. MRI brain without contrast showed punctate acute left pontine infarct & moderate chronic small vessel ischemic disease. Pt is also being treated for severe sepsis & possible UTI. PMH: CKD 3B, DM2, HTN, HLD, CVA    PT Comments  Pt received up in chair. She is pleasantly confused and agreeable to participate in therapy session. Pt with episode of bowel incontinence in standing and assisted to bedside commode. Pt able to perform posterior peri care with set up. Pt ambulating limited hallway distance with Rollator and min assist for dynamic balance. Continues with deficits in power, strength, balance, and cognition. Patient will benefit from intensive inpatient follow-up therapy, >3 hours/day.    If plan is discharge home, recommend the following: A little help with walking and/or transfers;Assistance with cooking/housework;A little help with bathing/dressing/bathroom;Assist for transportation;Help with stairs or ramp for entrance;Direct supervision/assist for financial management;Supervision due to cognitive status;Direct supervision/assist for medications management   Can travel by private vehicle        Equipment Recommendations  None recommended by PT    Recommendations for Other Services       Precautions / Restrictions Precautions Precautions: Fall Restrictions Weight Bearing Restrictions Per Provider Order: No     Mobility  Bed Mobility               General bed mobility comments: OOB in chair    Transfers Overall transfer level: Needs assistance Equipment used: Rolling walker (2 wheels) Transfers: Sit to/from Stand Sit to Stand: Min assist           General transfer comment: Assist to power up, cues for hand  placement    Ambulation/Gait Ambulation/Gait assistance: Min assist Gait Distance (Feet): 100 Feet Assistive device: Rollator (4 wheels) Gait Pattern/deviations: Step-through pattern, Decreased stride length, Drifts right/left Gait velocity: decreased Gait velocity interpretation: <1.8 ft/sec, indicate of risk for recurrent falls   General Gait Details: Up to minA for dynamic balance due to drift, cues for environmental navigation and maneuvering around obstacles   Stairs             Wheelchair Mobility     Tilt Bed    Modified Rankin (Stroke Patients Only) Modified Rankin (Stroke Patients Only) Pre-Morbid Rankin Score: No significant disability Modified Rankin: Moderately severe disability     Balance Overall balance assessment: Needs assistance Sitting-balance support: Feet supported Sitting balance-Leahy Scale: Fair Sitting balance - Comments: supervision static sitting EOB   Standing balance support: Bilateral upper extremity supported, Reliant on assistive device for balance, During functional activity Standing balance-Leahy Scale: Fair                              Communication Communication Communication: Impaired Factors Affecting Communication: Hearing impaired;Difficulty expressing self  Cognition Arousal: Alert Behavior During Therapy: WFL for tasks assessed/performed   PT - Cognitive impairments: Orientation, Awareness, Memory, Initiation, Attention, Sequencing, Problem solving, Safety/Judgement                       PT - Cognition Comments: Pt oriented to self and birthday. Pt stating she wants to go to heaven Following commands: Impaired Following commands impaired: Follows one step commands with increased time, Follows one step  commands inconsistently    Cueing Cueing Techniques: Verbal cues, Gestural cues, Tactile cues, Visual cues  Exercises      General Comments        Pertinent Vitals/Pain Pain Assessment Pain  Assessment: Faces Faces Pain Scale: Hurts little more Pain Location: headache Pain Descriptors / Indicators: Headache Pain Intervention(s): RN gave pain meds during session    Home Living                          Prior Function            PT Goals (current goals can now be found in the care plan section) Acute Rehab PT Goals Potential to Achieve Goals: Good Progress towards PT goals: Progressing toward goals    Frequency    Min 3X/week      PT Plan      Co-evaluation              AM-PAC PT 6 Clicks Mobility   Outcome Measure  Help needed turning from your back to your side while in a flat bed without using bedrails?: A Little Help needed moving from lying on your back to sitting on the side of a flat bed without using bedrails?: A Little Help needed moving to and from a bed to a chair (including a wheelchair)?: A Little Help needed standing up from a chair using your arms (e.g., wheelchair or bedside chair)?: A Little Help needed to walk in hospital room?: A Little Help needed climbing 3-5 steps with a railing? : A Lot 6 Click Score: 17    End of Session Equipment Utilized During Treatment: Gait belt Activity Tolerance: Patient tolerated treatment well;Patient limited by fatigue Patient left: in chair;with call bell/phone within reach;with chair alarm set Nurse Communication: Mobility status PT Visit Diagnosis: Muscle weakness (generalized) (M62.81);Difficulty in walking, not elsewhere classified (R26.2);Other abnormalities of gait and mobility (R26.89);Unsteadiness on feet (R26.81)     Time: 1456-1530 PT Time Calculation (min) (ACUTE ONLY): 34 min  Charges:    $Therapeutic Activity: 23-37 mins PT General Charges $$ ACUTE PT VISIT: 1 Visit                     Aleck [name], PT, DPT Acute Rehabilitation Services Office [phone]    Sara [name] 06/16/2024, 4:12 PM

## 2024-06-16 NOTE — Progress Notes (Addendum)
 Initial Nutrition Assessment  DOCUMENTATION CODES:   Severe malnutrition in context of chronic illness  INTERVENTION:  Add thiamine  100mg  x30 days for repletion Add Boost Plus po TID, each supplement provides 360 kcal and 14 grams of protein  Add MVI w/ minerals Add Magic cup TID with meals, each supplement provides 290 kcal and 9 grams of protein  Liberalize diet to encourage intake    NUTRITION DIAGNOSIS:  Severe Malnutrition related to chronic illness (CKD III, T2DM, hx dysphagia 2/2 hypoglossal nerve palsy) as evidenced by severe muscle depletion, severe fat depletion.   GOAL:  Patient will meet greater than or equal to 90% of their needs  MONITOR:  PO intake, Supplement acceptance, Weight trends, Skin  REASON FOR ASSESSMENT:  Consult Assessment of nutrition requirement/status  ASSESSMENT:  Pt with PMH significant for: CKDIII, dysphagia 2/2 hypoglossal nerve palsy,T2DM, HLD, HTN. Presented to [facility] on Friday (06/12/2024) with reports of gait imbalance, and ataxia. Found to have severe sepsis 2/2 UTI/Klebsiella bacteremia, AKI and found to have acute CVA on MRI.  Previous Admissions 6/14-6/16: acute respiratory failure  6/27-6/29: AKI 2/2 poor PO intake/dehydration Current Admission 8/15: presented to [facility] [place] 8/17: urine/blood cultures: + negative rod 8/18: MRI brain: puntuate acute left pontine infarct; TEE: EF 65-70%  Multiple admissions over last three months. CVA still in work up. Could be incidental finding. Has had a PEG in the past, which was removed in February of this year.   Average Meal Intake No documented meal intake to review  Pt in bed with family at bedside at time of assessment. She has headache and is, thus, unwilling to readily engage in discussion. Daughter and son at bedside and completing nutrition interview.   They endorse that she has been taking Megestrol , with good effect. Reduced to twice weekly, from daily administration approximately two weeks  ago. Have noticed significant increase in PO intake since addition of appetite stimulant. Intake has been somewhat decreased in 1-2 days PTA and since admission. Daughter reports intake has picked up today. Breakfast included cereal, applesauce, and water .   24 Hour Recall B: oatmeal, coffee, toast OR eggs and sausage w/ coffee L: veggie based: onions and potatoes D: protein, starch, veg Snacks: Boost (chocolate) x3 daily  No difficulties chewing or swallowing. Daughter reports that she needs new bottom dentures and is working on obtaining these. This is main barrier to intake as ability to masticate her food is impaired, however not significantly. Endorse bowels stable and regularly once per day.   Discussed importance of PO intake and hydration to prevent skin breakdown, loss of lean body mass, and dehydration. Encouraged continued intake as her weight gain is promising. Will monitor trend.   Admit Weight: 41.3 - ? accuracy; appears pulled forward from previous admisison Current Weight: 48.3 kg  No edema on exam. Patient's daughter reports she just completed a course of Lasix at the end of last month. Per chart review, she has shown 2.8% weight gain since discharge at end of June. This is desirable. Some true body weight gain likely as family reports she was consuming quite a bit of food, in addition to multiple Boost supplements daily, since discharge due to use of appetite stimulant.   Drains/Lines: Foley catheter UOP: 2900 ml x24 hours  Sodium stabilized. BUN and Crt improving with IVFs. Potassium has come down. Patient required Lokelma .  Thiamine  was low in May. Unsure if it was repleted. Will order repletion while admitted.   Meds: SSI novolog  0-9 TID, IV ABX,  sodium bicarbonate   Labs:  Na+ 870>866>863 (wdl) K+ 6.4---3.5 (wdl) Crt 9.40>7.51>6.52 (H) BUN 135>95>77 (H) WBC 18.6>19.9>18.0 (H) CBGs 72-112 x24 hours A1c 6.6 (05/2024)   NUTRITION - FOCUSED PHYSICAL  EXAM:  Flowsheet Row Most Recent Value  Orbital Region Moderate depletion  Upper Arm Region Severe depletion  Thoracic and Lumbar Region Severe depletion  Buccal Region Moderate depletion  Temple Region Severe depletion  Clavicle Bone Region Severe depletion  Clavicle and Acromion Bone Region Severe depletion  Scapular Bone Region Severe depletion  Dorsal Hand Moderate depletion  Patellar Region Moderate depletion  Anterior Thigh Region Severe depletion  Posterior Calf Region Severe depletion  Edema (RD Assessment) None  Hair Reviewed  Eyes Reviewed  Mouth Reviewed  [no bottom dentures]  Skin Reviewed  Nails Reviewed   Diet Order:   Diet Order             Diet renal with fluid restriction Fluid restriction: 1200 mL Fluid; Room service appropriate? Yes; Fluid consistency: Thin  Diet effective now             EDUCATION NEEDS:  Education needs have been addressed  Skin:  Skin Assessment: Reviewed RN Assessment  Last BM:  8/17  Height:  Ht Readings from Last 1 Encounters:  06/14/24 5' 1 (1.549 m)   Weight:  Wt Readings from Last 1 Encounters:  06/16/24 48.3 kg    Ideal Body Weight:  47.7 kg  BMI:  Body mass index is 20.12 kg/m.  Estimated Nutritional Needs:   Kcal:  1400-1600 kcals  Protein:  65-80g  Fluid:  1.4-1.L/day  Blair Deaner MS, RD, LDN Registered Dietitian Clinical Nutrition RD Inpatient Contact Info in Amion

## 2024-06-16 NOTE — Consult Note (Signed)
 Physical Medicine and Rehabilitation Consult Reason for Consult: Impaired balance and functional mobility after stroke Referring Physician: Raenelle   HPI: Sara [name] is a 83 y.o. female with history of CKD stage IIIb, chronic dysphagia due to hypoglossal nerve palsy, type 2 diabetes, hypertension who presented on 06/15/2024 with a history since Friday of balance difficulties.  Patient was found to have severe sepsis due to Klebsiella UTI.  Additionally MRI of the brain ultimately was performed and demonstrated a punctate acute left pontine infarct as well as small vessel disease.  CT angio was not performed given her renal status.  Patient also with acute kidney injury superimposed upon her chronic disease.  Renal ultrasound without any acute abnormalities.  Foley catheter was placed on admission.  Transthoracic echoc with normal ejection fraction and grade 1 diastolic dysfunction.  Patient remains on Rocephin  for her urosepsis.  She received bicarb infusion for her renal abnormalities.  Patient was up with therapy yesterday and transferred sit to stand with min assist and walked 50 feet min assist to contact-guard assistance.  Patient lives at home with her children in a 1 level house with one-step to enter.  Patient was modified independent using a rollator at home.  She also performed basic ADLs and was cooking independently prior to this admission.   Home: Home Living Family/patient expects to be discharged to:: Private residence Living Arrangements: Children Available Help at Discharge: Family Type of Home: House Home Access: Stairs to enter Secretary/administrator of Steps: 1 Home Layout: One level Bathroom Shower/Tub: Engineer, manufacturing systems: Standard Bathroom Accessibility: Yes Home Equipment: Rollator (4 wheels)  Functional History: Prior Function Prior Level of Function : Independent/Modified Independent Mobility Comments: Ambulatory with rollator, denies  falls. ADLs Comments: Bathes & dresses without assistance, cooks (cooked ribs for family last weekend). Functional Status:  Mobility: Bed Mobility Overal bed mobility: Needs Assistance Bed Mobility: Supine to Sit Supine to sit: Min assist, Contact guard, Used rails, HOB elevated (exit L side of bed) General bed mobility comments: OOB in chair Transfers Overall transfer level: Needs assistance Equipment used: Rolling walker (2 wheels) Transfers: Sit to/from Stand Sit to Stand: Min assist General transfer comment: cuing re: hand placement with RW, assistance to power up from low surface Ambulation/Gait Ambulation/Gait assistance: Contact guard assist, Min assist Gait Distance (Feet): 50 Feet Assistive device: Rolling walker (2 wheels) Gait Pattern/deviations: Decreased step length - right, Decreased stride length, Decreased step length - left General Gait Details: Cuing to ambulate within base of AD as pt standing slightly to R of walker, extra time to maneuver RW around obstacles. Gait velocity: decreased    ADL: ADL Overall ADL's : Needs assistance/impaired Eating/Feeding: Set up, Sitting Grooming: Minimal assistance, Standing, Wash/dry face Upper Body Bathing: Set up, Sitting Lower Body Bathing: Minimal assistance, Sit to/from stand Upper Body Dressing : Set up, Sitting Lower Body Dressing: Minimal assistance, Sit to/from stand Toilet Transfer: Minimal assistance, Cueing for safety, Ambulation, Rolling walker (2 wheels), Regular Toilet, Grab bars Toileting- Clothing Manipulation and Hygiene: Minimal assistance, Sit to/from stand Functional mobility during ADLs: Rolling walker (2 wheels), Minimal assistance  Cognition: Cognition Orientation Level: Oriented X4 Cognition Arousal: Alert Behavior During Therapy: WFL for tasks assessed/performed   Review of Systems  Unable to perform ROS: Mental acuity   Past Medical History:  Diagnosis Date   Allergy    Dizziness  02/21/2018   Estrogen deficiency 01/13/2018   Need for prophylactic vaccination against Streptococcus pneumoniae (pneumococcus) 01/13/2018  Normocytic anemia 04/11/2024   Right knee pain 05/29/2019   Routine general medical examination at a health care facility 01/13/2018   Seasonal allergies 06/21/2020   Vaginal itching 06/21/2020   Vision changes 01/13/2018   Past Surgical History:  Procedure Laterality Date   DG THUMB RIGHT HAND ([facility] HX) Right    EYE SURGERY     IR GASTROSTOMY TUBE MOD SED  09/05/2022   IR GASTROSTOMY TUBE REMOVAL  12/04/2022   TUBAL LIGATION     Family History  Problem Relation Age of Onset   Heart disease Mother    Diabetes Mother    Other Mother        Visual merchandiser   Alzheimer's disease Father    Cancer Sister    Cancer Brother    Alzheimer's disease Brother    Breast cancer Neg Hx    Social History:  reports that she has never smoked. She has never used smokeless tobacco. She reports that she does not drink alcohol  and does not use drugs. Allergies: No Known Allergies Medications Prior to Admission  Medication Sig Dispense Refill   cetirizine  (ZYRTEC ) 10 MG tablet Take 1 tablet (10 mg total) by mouth daily as needed for allergies. 30 tablet 11   cholecalciferol (VITAMIN D3) 25 MCG (1000 UNIT) tablet Take 1,000 Units by mouth daily.     folic acid  (FOLVITE ) 1 MG tablet Take 1 tablet (1 mg total) by mouth daily. (Patient taking differently: Take 1 mg by mouth at bedtime.) 90 tablet 0   furosemide (LASIX) 20 MG tablet Take 20 mg by mouth every morning.     hydrochlorothiazide  (HYDRODIURIL ) 25 MG tablet Take 25 mg by mouth daily. 90 day supply June 2025     hydroxypropyl methylcellulose / hypromellose (ISOPTO TEARS / GONIOVISC) 2.5 % ophthalmic solution Place 1 drop into both eyes 4 (four) times daily as needed for dry eyes.     mirtazapine  (REMERON ) 15 MG tablet Take 15 mg by mouth at bedtime. 90 day supply 03/2024     potassium chloride  (KLOR-CON ) 10 MEQ tablet Take  10 mEq by mouth every morning.     Accu-Chek Softclix Lancets lancets Please use to check blood sugar up to 4 times daily. 400 each 1   atorvastatin  (LIPITOR) 40 MG tablet Place 1 tablet (40 mg total) into feeding tube daily. (Patient taking differently: Take 40 mg by mouth at bedtime.) 90 tablet 0   blood glucose meter kit and supplies KIT Dispense based on patient and insurance preference. Use up to four times daily as directed. 1 each 0   esomeprazole  (NEXIUM ) 10 MG packet Take 10 mg by mouth daily before breakfast. (Patient not taking: Reported on 06/14/2024) 30 each 12   glucose blood (ACCU-CHEK GUIDE) test strip USE 1  4 TIMES DAILY 400 each 0   Insulin  Pen Needle (PEN NEEDLES 3/16) 31G X 5 MM MISC Use as instructed to inject insulin  once daily 90 each 3     Blood pressure (!) 143/72, pulse (!) 127, temperature 99.2 F (37.3 C), temperature source Oral, resp. rate 20, height 5' 1 (1.549 m), weight 48.3 kg, SpO2 98%. Physical Exam Constitutional:      General: She is not in acute distress. HENT:     Head: Normocephalic and atraumatic.     Nose: Nose normal.     Mouth/Throat:     Mouth: Mucous membranes are moist.  Eyes:     Pupils: Pupils are equal, round, and reactive to light.  Cardiovascular:     Rate and Rhythm: Tachycardia present.  Pulmonary:     Effort: Pulmonary effort is normal.  Abdominal:     Palpations: Abdomen is soft.  Musculoskeletal:        General: No swelling or tenderness.     Cervical back: Normal range of motion.  Skin:    General: Skin is warm.  Neurological:     Mental Status: She is alert.     Comments: Pt is alert, oriented to self, the 8th month and the 25th year. Knew she was at [facility]. Struggled with biographical information. Slow to process information, perform simple tasks. Right central VII. MMT: RUE 4/5, RLE 3+ to 4-/5 with processing delays, motor apraxia. LUE and LLE grossly 4 to 4+/5. Decreased LT right leg and foot although  somewhat inconsistent. No abnl resting tone. DTR's 1+  Psychiatric:     Comments: Pt pleasant but distracted.     Results for orders placed or performed during the hospital encounter of 06/14/24 (from the past 24 hours)  Glucose, capillary     Status: None   Collection Time: 06/15/24  3:57 PM  Result Value Ref Range   Glucose-Capillary 94 70 - 99 mg/dL  Glucose, capillary     Status: None   Collection Time: 06/15/24  9:31 PM  Result Value Ref Range   Glucose-Capillary 93 70 - 99 mg/dL  Lactic acid, plasma     Status: None   Collection Time: 06/16/24  5:45 AM  Result Value Ref Range   Lactic Acid, Venous 1.1 0.5 - 1.9 mmol/L  Renal function panel     Status: Abnormal   Collection Time: 06/16/24  5:45 AM  Result Value Ref Range   Sodium 136 135 - 145 mmol/L   Potassium 3.5 3.5 - 5.1 mmol/L   Chloride 97 (L) 98 - 111 mmol/L   CO2 23 22 - 32 mmol/L   Glucose, Bld 72 70 - 99 mg/dL   BUN 77 (H) 8 - 23 mg/dL   Creatinine, Ser 3.47 (H) 0.44 - 1.00 mg/dL   Calcium  7.8 (L) 8.9 - 10.3 mg/dL   Phosphorus 4.0 2.5 - 4.6 mg/dL   Albumin 1.6 (L) 3.5 - 5.0 g/dL   GFR, Estimated 6 (L) >60 mL/min   Anion gap 16 (H) 5 - 15  CBC with Differential/Platelet     Status: Abnormal   Collection Time: 06/16/24  5:45 AM  Result Value Ref Range   WBC 18.0 (H) 4.0 - 10.5 K/uL   RBC 3.67 (L) 3.87 - 5.11 MIL/uL   Hemoglobin 9.7 (L) 12.0 - 15.0 g/dL   HCT 73.7 (L) 63.9 - 53.9 %   MCV 71.4 (L) 80.0 - 100.0 fL   MCH 26.4 26.0 - 34.0 pg   MCHC 37.0 (H) 30.0 - 36.0 g/dL   RDW 85.6 88.4 - 84.4 %   Platelets 223 150 - 400 K/uL   nRBC 0.0 0.0 - 0.2 %   Neutrophils Relative % 79 %   Neutro Abs 14.2 (H) 1.7 - 7.7 K/uL   Lymphocytes Relative 13 %   Lymphs Abs 2.4 0.7 - 4.0 K/uL   Monocytes Relative 5 %   Monocytes Absolute 1.0 0.1 - 1.0 K/uL   Eosinophils Relative 1 %   Eosinophils Absolute 0.1 0.0 - 0.5 K/uL   Basophils Relative 0 %   Basophils Absolute 0.0 0.0 - 0.1 K/uL   Immature Granulocytes 2 %    Abs Immature Granulocytes 0.27 (H)  0.00 - 0.07 K/uL  Glucose, capillary     Status: None   Collection Time: 06/16/24  9:07 AM  Result Value Ref Range   Glucose-Capillary 76 70 - 99 mg/dL  Glucose, capillary     Status: None   Collection Time: 06/16/24 11:50 AM  Result Value Ref Range   Glucose-Capillary 78 70 - 99 mg/dL   ECHOCARDIOGRAM COMPLETE Result Date: 06/15/2024    ECHOCARDIOGRAM REPORT   Patient Name:   CLESSIE KARRAS Date of Exam: 06/15/2024 Medical Rec #:  [id number]         Height:       61.0 in Accession #:    [id number]        Weight:       91.0 lb Date of Birth:  1941-09-09         BSA:          1.352 m Patient Age:    83 years          BP:           152/59 mmHg Patient Gender: F                 HR:           111 bpm. Exam Location:  Inpatient Procedure: 2D Echo, Color Doppler and Cardiac Doppler (Both Spectral and Color            Flow Doppler were utilized during procedure). Indications:    Stroke  History:        Patient has prior history of Echocardiogram examinations, most                 recent 04/11/2024. Risk Factors:Diabetes.  Sonographer:    Benard Stallion Referring Phys: [id number] VISHAL R PATEL IMPRESSIONS  1. Left ventricular ejection fraction, by estimation, is 65 to 70%. The left ventricle has normal function. The left ventricle has no regional wall motion abnormalities. Left ventricular diastolic parameters are consistent with Grade I diastolic dysfunction (impaired relaxation).  2. Right ventricular systolic function is normal. The right ventricular size is normal. There is mildly elevated pulmonary artery systolic pressure. The estimated right ventricular systolic pressure is 37.1 mmHg.  3. The mitral valve is normal in structure. No evidence of mitral valve regurgitation. No evidence of mitral stenosis.  4. The aortic valve is tricuspid. There is mild calcification of the aortic valve. Aortic valve regurgitation is not visualized. No aortic stenosis is present.  5. The  inferior vena cava is normal in size with greater than 50% respiratory variability, suggesting right atrial pressure of 3 mmHg. FINDINGS  Left Ventricle: Left ventricular ejection fraction, by estimation, is 65 to 70%. The left ventricle has normal function. The left ventricle has no regional wall motion abnormalities. The left ventricular internal cavity size was normal in size. There is  no left ventricular hypertrophy. Left ventricular diastolic parameters are consistent with Grade I diastolic dysfunction (impaired relaxation). Right Ventricle: The right ventricular size is normal. No increase in right ventricular wall thickness. Right ventricular systolic function is normal. There is mildly elevated pulmonary artery systolic pressure. The tricuspid regurgitant velocity is 2.92  m/s, and with an assumed right atrial pressure of 3 mmHg, the estimated right ventricular systolic pressure is 37.1 mmHg. Left Atrium: Left atrial size was normal in size. Right Atrium: Right atrial size was normal in size. Pericardium: There is no evidence of pericardial effusion. Mitral Valve: The mitral valve is normal in structure. Mild mitral  annular calcification. No evidence of mitral valve regurgitation. No evidence of mitral valve stenosis. MV peak gradient, 8.9 mmHg. The mean mitral valve gradient is 4.0 mmHg. Tricuspid Valve: The tricuspid valve is normal in structure. Tricuspid valve regurgitation is trivial. Aortic Valve: The aortic valve is tricuspid. There is mild calcification of the aortic valve. Aortic valve regurgitation is not visualized. No aortic stenosis is present. Aortic valve mean gradient measures 5.5 mmHg. Aortic valve peak gradient measures 9.7 mmHg. Aortic valve area, by VTI measures 2.09 cm. Pulmonic Valve: The pulmonic valve was normal in structure. Pulmonic valve regurgitation is not visualized. Aorta: The aortic root is normal in size and structure. Venous: The inferior vena cava is normal in size with  greater than 50% respiratory variability, suggesting right atrial pressure of 3 mmHg. IAS/Shunts: No atrial level shunt detected by color flow Doppler.  LEFT VENTRICLE PLAX 2D LVIDd:         3.10 cm   Diastology LVIDs:         1.90 cm   LV e' medial:    8.27 cm/s LV PW:         0.90 cm   LV E/e' medial:  9.9 LV IVS:        0.90 cm   LV e' lateral:   7.07 cm/s LVOT diam:     1.70 cm   LV E/e' lateral: 11.6 LV SV:         48 LV SV Index:   35 LVOT Area:     2.27 cm  RIGHT VENTRICLE RV Basal diam:  3.30 cm RV Mid diam:    2.60 cm RV S prime:     19.00 cm/s TAPSE (M-mode): 2.0 cm LEFT ATRIUM             Index        RIGHT ATRIUM          Index LA diam:        2.50 cm 1.85 cm/m   RA Area:     8.01 cm LA Vol (A2C):   25.2 ml 18.65 ml/m  RA Volume:   14.80 ml 10.95 ml/m LA Vol (A4C):   32.6 ml 24.12 ml/m LA Biplane Vol: 31.4 ml 23.23 ml/m  AORTIC VALVE AV Area (Vmax):    2.05 cm AV Area (Vmean):   1.96 cm AV Area (VTI):     2.09 cm AV Vmax:           156.00 cm/s AV Vmean:          109.000 cm/s AV VTI:            0.229 m AV Peak Grad:      9.7 mmHg AV Mean Grad:      5.5 mmHg LVOT Vmax:         141.00 cm/s LVOT Vmean:        94.000 cm/s LVOT VTI:          0.211 m LVOT/AV VTI ratio: 0.92  AORTA Ao Root diam: 2.40 cm MITRAL VALVE                TRICUSPID VALVE MV Area (PHT): 6.54 cm     TR Peak grad:   34.1 mmHg MV Area VTI:   2.13 cm     TR Vmax:        292.00 cm/s MV Peak grad:  8.9 mmHg MV Mean grad:  4.0 mmHg     SHUNTS MV Vmax:  1.49 m/s     Systemic VTI:  0.21 m MV Vmean:      90.2 cm/s    Systemic Diam: 1.70 cm MV Decel Time: 116 msec MV E velocity: 81.90 cm/s MV A velocity: 162.00 cm/s MV E/A ratio:  0.51 Dalton McleanMD Electronically signed by Ezra Kanner Signature Date/Time: 06/15/2024/8:28:47 PM    Final    US  RENAL Result Date: 06/14/2024 CLINICAL DATA:  Acute renal failure superimposed on stage 3 chronic kidney disease. EXAM: RENAL / URINARY TRACT ULTRASOUND COMPLETE COMPARISON:   Ultrasound 04/24/2024 FINDINGS: Right Kidney: Renal measurements: 10.9 x 5.7 x 5.5 cm = volume: 179 mL. Increased cortical echogenicity. 1.1 x 1.0 x 0.8 cm cyst. No follow-up recommended. No hydronephrosis. Left Kidney: Renal measurements: 9.9 x 5.2 x 5.6 cm = volume: 152 mL. Increased cortical echogenicity. No mass or hydronephrosis. Bladder: Foley catheter in the decompressed bladder. Other: None. IMPRESSION: Increased cortical echogenicity bilaterally compatible with medical renal disease. No hydronephrosis. Electronically Signed   By: Norman Gatlin M.D.   On: 06/14/2024 21:04   MR BRAIN WO CONTRAST Result Date: 06/14/2024 CLINICAL DATA:  Mental status change, unknown cause. EXAM: MRI HEAD WITHOUT CONTRAST TECHNIQUE: Multiplanar, multiecho pulse sequences of the brain and surrounding structures were obtained without intravenous contrast. COMPARISON:  Head MRI 03/26/2024 FINDINGS: Brain: A punctate acute infarct is noted in the left pons. Patchy T2 hyperintensities in the cerebral white matter bilaterally are similar to the prior MRI and are nonspecific but compatible with moderate chronic small vessel ischemic disease. A chronic lacunar infarct at the posterior aspect of the left putamen is unchanged. There is mild generalized cerebral atrophy. Vascular: Major intracranial vascular flow voids are preserved. Skull and upper cervical spine: Unremarkable bone marrow signal. Sinuses/Orbits: Bilateral cataract extraction. Minimal mucosal thickening in the paranasal sinuses. Trace bilateral mastoid fluid. Other: None. IMPRESSION: 1. Punctate acute left pontine infarct. 2. Moderate chronic small vessel ischemic disease. Electronically Signed   By: Dasie Hamburg M.D.   On: 06/14/2024 19:11   DG Chest Port 1 View Result Date: 06/14/2024 CLINICAL DATA:  Lethargy possible sepsis EXAM: PORTABLE CHEST 1 VIEW COMPARISON:  04/10/2024 FINDINGS: The heart size and mediastinal contours are within normal limits. Both lungs  are clear. The visualized skeletal structures are unremarkable. Aortic atherosclerosis. Multiple skin fold artifact over left chest. IMPRESSION: No active disease. Electronically Signed   By: Luke Bun M.D.   On: 06/14/2024 17:00   DG Knee Complete 4 Views Right Result Date: 06/14/2024 CLINICAL DATA:  Right knee pain EXAM: RIGHT KNEE - COMPLETE 4+ VIEW COMPARISON:  None Available. FINDINGS: No evidence of fracture, dislocation, or joint effusion. No evidence of arthropathy or other focal bone abnormality. Soft tissues are unremarkable. IMPRESSION: No acute osseous findings. Electronically Signed   By: Michaeline Blanch M.D.   On: 06/14/2024 15:12   DG Tibia/Fibula Right Result Date: 06/14/2024 CLINICAL DATA:  Pain EXAM: RIGHT TIBIA AND FIBULA - 2 VIEW COMPARISON:  Same day knee radiographs FINDINGS: There is no evidence of fracture or other focal bone lesions. Scattered vascular calcifications. No soft tissue swelling. IMPRESSION: No acute osseous findings. Electronically Signed   By: Michaeline Blanch M.D.   On: 06/14/2024 15:11    Assessment/Plan: Diagnosis: 75-year-old female status post left pontine infarct likely due to small vessel disease. Does the need for close, 24 hr/day medical supervision in concert with the patient's rehab needs make it unreasonable for this patient to be served in a less intensive setting? Yes  Co-Morbidities requiring supervision/potential complications:  - Chronic kidney disease stage IIIb -Urosepsis -Hypertension- -diabetes Due to bladder management, bowel management, safety, skin/wound care, disease management, medication administration, pain management, and patient education, does the patient require 24 hr/day rehab nursing? Yes Does the patient require coordinated care of a physician, rehab nurse, therapy disciplines of PT, OT, ?SLP to address physical and functional deficits in the context of the above medical diagnosis(es)? Yes Addressing deficits in the following  areas: balance, endurance, locomotion, strength, transferring, bowel/bladder control, bathing, dressing, feeding, grooming, toileting, cognition, and psychosocial support Can the patient actively participate in an intensive therapy program of at least 3 hrs of therapy per day at least 5 days per week? Yes The potential for patient to make measurable gains while on inpatient rehab is good Anticipated functional outcomes upon discharge from inpatient rehab are modified independent and supervision  with PT, modified independent and supervision with OT, modified independent and supervision with SLP. Estimated rehab length of stay to reach the above functional goals is: 7 days Anticipated discharge destination: Home Overall Rehab/Functional Prognosis: excellent  POST ACUTE RECOMMENDATIONS: This patient's condition is appropriate for continued rehabilitative care in the following setting: CIR Patient has agreed to participate in recommended program. N/A Note that insurance prior authorization may be required for reimbursement for recommended care.  Comment: Spoke with son who was at bedside. He was concerned about her confusion although he thought it might be related to hypoglycemia. (She was eating lunch when I came in). In speaking with nurse, she had been experiencing more confusion since last night. Recommended to nurse that if improving CBG's didn't improve her MS, that her attending should be contacted for reassessment, ?HCT.  Rehab Admissions Coordinator to follow up regarding potential rehab admit.   MEDICAL RECOMMENDATIONS: ? Repeat HCT if AMS doesn't improve with lunch.    I have personally performed a face to face diagnostic evaluation of this patient. Additionally, I have examined the patient's medical record including any pertinent labs and radiographic images.    Thanks,  Arthea ONEIDA Gunther, MD 06/16/2024

## 2024-06-16 NOTE — TOC Initial Note (Signed)
 Transition of Care [facility]) - Initial/Assessment Note    Patient Details  Name: Sara [name] MRN: [id number] Date of Birth: 1941-04-18  [facility]) CM/SW Contact:    Inocente GORMAN Kindle, LCSW Phone Number: 06/16/2024, 3:21 PM  Clinical Narrative:                 Patient admitted from home with family. CSW following for therapy needs and CIR evaluation of candidacy.   Expected Discharge Plan: IP Rehab Facility Barriers to Discharge: Continued Medical Work up, English as a second language teacher   Patient Goals and CMS Choice Patient states their goals for this hospitalization and ongoing recovery are:: Rehab   Choice offered to / list presented to : Adult Children [place] ownership interest in [facility].provided to:: Adult Children    Expected Discharge Plan and Services In-house Referral: Clinical Social Work     Living arrangements for the past 2 months: Single Family Home                                      Prior Living Arrangements/Services Living arrangements for the past 2 months: Single Family Home   Patient language and need for interpreter reviewed:: Yes Do you feel safe going back to the place where you live?: Yes      Need for Family Participation in Patient Care: Yes (Comment) Care giver support system in place?: Yes (comment)   Criminal Activity/Legal Involvement Pertinent to Current Situation/Hospitalization: No - Comment as needed  Activities of Daily Living   ADL Screening (condition at time of admission) Independently performs ADLs?: Yes (appropriate for developmental age) Is the patient deaf or have difficulty hearing?: Yes Does the patient have difficulty seeing, even when wearing glasses/contacts?: No Does the patient have difficulty concentrating, remembering, or making decisions?: No  Permission Sought/Granted Permission sought to share information with : Facility Medical sales representative, Family Supports Permission granted  to share information with : Yes, Verbal Permission Granted  Share Information with NAME: Azra, Abrell   [phone]           Emotional Assessment Appearance:: Appears stated age     Orientation: : Oriented to Self, Oriented to Place, Oriented to  Time, Oriented to Situation (some confusion) Alcohol  / Substance Use: Not Applicable Psych Involvement: No (comment)  Admission diagnosis:  Hyperkalemia [E87.5] Ataxia [R27.0] Acute CVA (cerebrovascular accident) (HCC) [I63.9] Acute renal failure, unspecified acute renal failure type (HCC) [N17.9] Cancer involving organ by direct extension from urinary bladder (HCC) [C67.9] Severe sepsis with lactic acidosis (HCC) [A41.9, R65.20, E87.20] Patient Active Problem List   Diagnosis Date Noted   Severe sepsis with lactic acidosis (HCC) 06/14/2024   Acute urinary retention 06/14/2024   Left pontine stroke (HCC) 06/14/2024   Hyperlipidemia associated with type 2 diabetes mellitus (HCC) 06/14/2024   AKI (acute kidney injury) (HCC) 04/24/2024   Hyperkalemia 04/24/2024   Normocytic anemia 04/11/2024   Hypercalcemia 04/11/2024   Hyponatremia 04/11/2024   Acute renal failure superimposed on stage 3b chronic kidney disease (HCC) 03/26/2024   Dysarthria 03/26/2024   Pain due to onychomycosis of toenails of both feet 03/29/2023   Gastroesophageal reflux disease 09/17/2022   Lung nodule 09/17/2022   Protein-calorie malnutrition, severe 09/06/2022   Dysphagia 09/03/2022   Acute rhinitis 08/24/2022   Bilateral hearing loss 07/30/2022   UTI (urinary tract infection) 06/01/2022   Murmur, cardiac 06/01/2022   Tinnitus 06/30/2021  Atherosclerotic vascular disease 09/16/2020   Need for immunization against influenza 09/16/2020   Stage 3a chronic kidney disease (CKD) (HCC) 09/14/2020   Neuropathic pain of both legs 06/23/2018   Hypertension associated with diabetes (HCC) 02/21/2018   Healthcare maintenance 01/13/2018   Type 2 diabetes mellitus  with chronic kidney disease, without long-term current use of insulin  (HCC) 01/13/2018   PCP:  Campbell Reynolds, NP Pharmacy:   [id number] - [place], [place] - [address] [place] [address] Phone: [phone] Fax: [phone]  [place] [facility] [address] [place] [address] Phone: [phone] Fax: [phone]  [facility] [address] [place] [address] Phone: [phone] Fax: [phone]     Social Drivers of Health (SDOH) Social History: SDOH Screenings   Food Insecurity: No Food Insecurity (06/15/2024)  Housing: Low Risk  (06/15/2024)  Transportation Needs: No Transportation Needs (06/15/2024)  Utilities: Not At Risk (06/15/2024)  Alcohol  Screen: Low Risk  (07/05/2021)  Depression (PHQ2-9): Low Risk  (04/12/2023)  Financial Resource Strain: Low Risk  (07/05/2021)  Physical Activity: Insufficiently Active (07/05/2021)  Social Connections: Moderately Integrated (06/15/2024)  Stress: No Stress Concern Present (07/05/2021)  Tobacco Use: Low Risk  (06/14/2024)   SDOH Interventions:     Readmission Risk Interventions    06/16/2024    3:19 PM 03/28/2024    1:40 PM 09/03/2022    2:19 PM  Readmission Risk Prevention Plan  Post Dischage Appt  Complete Complete  Medication Screening  Complete Complete  Transportation Screening Complete Complete Complete  Medication Review Oceanographer) Complete    PCP or Specialist appointment within 3-5 days of discharge Complete    HRI or Home Care Consult Complete    SW Recovery Care/Counseling Consult Complete    Palliative Care Screening Not Applicable    Skilled Nursing Facility Complete

## 2024-06-16 NOTE — Plan of Care (Signed)

## 2024-06-16 NOTE — Progress Notes (Signed)
 Pt is alert and oriented x 3, afebrile, stable hemodynamically, on room air, normal respiratory effort, no obvious acute distress. She is able to rest well with no complaints overnight. NIHSS as documented below. Plan of care is reviewed. Pt has been progressing. We will continue to monitor.    06/16/24 0340  NIH Stroke Scale   Dizziness Present No  Headache Present No  Interval Shift assessment  Level of Consciousness (1a.)    0  LOC Questions (1b. )    0  LOC Commands (1c. )    0  Best Gaze (2. )   0  Visual (3. )   0  Facial Palsy (4. )     0  Motor Arm, Left (5a. )    0  Motor Arm, Right (5b. )  0  Motor Leg, Left (6a. )   0  Motor Leg, Right (6b. )  0  Limb Ataxia (7. ) 0  Sensory (8. )   0  Best Language (9. )   (S)  1  Dysarthria (10. ) 0  Extinction/Inattention (11.)    0  Complete NIHSS TOTAL 1   Wendi Dash, RN

## 2024-06-16 NOTE — Progress Notes (Signed)
 [facility] Progress Note  Subjective:  Patient states she does not feel very good today.  Having some confusion and swimmy headed feeling.  Some nausea but denies vomiting.  Vitals:   06/16/24 0014 06/16/24 0400 06/16/24 0500 06/16/24 0753  BP: 135/61 (!) 149/65  137/66  Pulse: (!) 110 (!) 111  (!) 109  Resp: 17 20  13   Temp: 98.8 F (37.1 C) 98.2 F (36.8 C)  98.4 F (36.9 C)  TempSrc: Oral Oral  Oral  SpO2: 98% 97%  96%  Weight:   48.3 kg   Height:        Exam: Gen alert, no distress, elderly female Chest: Bilateral chest rise with no increased work of breathing Cardiac: Tachycardic, no rub Abd soft ntnd no mass or ascites +bs GU foley placed draining with some cloudiness Ext no LE or UE edema Neuro is alert, Ox 3 , nf     Home bp meds: Hydrochlorothiazide  Others: lipitor, nexium , megace  (not taking), remeron , prns       Date                             Creat               eGFR (ml/min) 2019- 2022                  0.69- 1.12 2023                            0.73- 2.13 2024                            0.85- 1.00        57- 69 ml/min May 2025                    2.24 >> 1.52    21- 34 ml/min June 2025                   1.75- 2.91        15- 29 ml/min 06/14/24                        9.52                 4 ml/min       UA: mod Hb, small LE, prot 100, >50 rbc/ >50 wbc/ 0-5 epi UNa, UCr: pend Renal US : 10.9/ 9/9 cm kidneys w/o hydro, ^echotexture   Assessment/ Plan: AKI on CKD 3b: b/l creatinine 1.5- 1.8 from may.  AKI up to 9.5 with urinary retention and likely UTI as well as dehydration and possible tubular injury.  Creatinine steadily improving with Foley catheter, antibiotics, hydration.  Likely sign off in the next 1 to 2 days if creatinine continues to improve  UTI/sepsis: Antibiotics per primary team Urine Marceline retention: Foley in place.  Management per primary team Metabolic acidosis: Resolved. Anemia: Multifactorial.  Continue management per  primary team         Jayson JINNY Player  06/16/2024, 10:16 AM  Recent Labs  Lab 06/15/24 0604 06/16/24 0545  HGB 9.7* 9.7*  ALBUMIN 1.9* 1.6*  CALCIUM  8.3* 7.8*  PHOS 4.0 4.0  CREATININE 7.51* 6.52*  K 4.8 3.5   No results for input(s): IRON, TIBC, FERRITIN in the last 168  hours. Inpatient medications:   stroke: early stages of recovery book   Does not apply Once   aspirin  EC  81 mg Oral Daily   atorvastatin   40 mg Oral QHS   heparin   5,000 Units Subcutaneous Q8H   insulin  aspart  0-9 Units Subcutaneous TID WC   tamsulosin   0.4 mg Oral Daily    cefTRIAXone  (ROCEPHIN )  IV 2 g (06/15/24 1735)   sodium bicarbonate  150 mEq in sterile water  1,150 mL infusion 150 mL/hr at 06/16/24 0737   acetaminophen  **OR** acetaminophen  (TYLENOL ) oral liquid 160 mg/5 mL **OR** acetaminophen , ondansetron  (ZOFRAN ) IV, senna-docusate

## 2024-06-16 NOTE — Progress Notes (Addendum)
 PROGRESS NOTE        PATIENT DETAILS Name: ROXI HLAVATY Age: 83 y.o. Sex: female Date of Birth: Jan 31, 1941 Admit Date: 06/14/2024 Admitting Physician Jorie [name] Blanch, MD ERE:Duntz, Damian, NP  Brief Summary: Patient is a 83 y.o.  female with history of CKD stage IIIb, DM-2, HTN, HLD-who presented with unsteady gait/imbalance-patient was found to have severe sepsis secondary to UTI/Klebsiella bacteremia, AKI-and possible acute CVA versus artifactual finding on MRI.  Significant events: 8/17>> admit to [facility].  Significant studies: 8/17>> x-ray right tibia/fibula: No acute findings 8/17>> x-ray right knee: No acute findings 8/17>> CXR: No active disease 8/17>> MRI brain: Punctate acute left pontine infarct (neurology thinks this could be artifactual) 8/17>> renal ultrasound: No hydronephrosis. 8/18>> TTE: EF 65-70%, grade 1 diastolic dysfunction 8/18>> A1c: 6.6  Significant microbiology data: 8/17>> urine culture: Gram-negative rod 8/17>> blood culture: Gram-negative rod (possibly Klebsiella)  Procedures: None  Consults: Nephrology Neurology  Subjective: Frail weak-mildly confused  Objective: Vitals: Blood pressure 137/66, pulse (!) 109, temperature 98.4 F (36.9 C), temperature source Oral, resp. rate 13, height 5' 1 (1.549 m), weight 48.3 kg, SpO2 96%.   Exam: Gen Exam: Mildly confused-easily redirectable. HEENT:atraumatic, normocephalic Chest: B/L clear to auscultation anteriorly CVS:S1S2 regular Abdomen:soft non tender, non distended Extremities:no edema Neurology: Non focal Skin: no rash  Pertinent Labs/Radiology:    Latest Ref Rng & Units 06/16/2024    5:45 AM 06/15/2024    6:04 AM 06/14/2024    3:07 PM  CBC  WBC 4.0 - 10.5 K/uL 18.0  19.9  18.6   Hemoglobin 12.0 - 15.0 g/dL 9.7  9.7  89.8   Hematocrit 36.0 - 46.0 % 26.2  27.7  29.5   Platelets 150 - 400 K/uL 223  237  302     Lab Results  Component Value Date   NA 136  06/16/2024   K 3.5 06/16/2024   CL 97 (L) 06/16/2024   CO2 23 06/16/2024      Assessment/Plan: Severe sepsis secondary to complicated UTI and Klebsiella bacteremia Sepsis physiology improved Continue Rocephin  Follow final cultures  AKI on CKD stage IIIb AKI felt to be multifactorial-secondary to urinary retention/sepsis physiology/volume depletion. Foley catheter placed on admission Renal function gradually improving with supportive care Avoid nephrotoxic agents Nephrology following.  Metabolic acidosis Secondary to worsening AKI Resolved with improvement in renal function/IV bicarb infusion  Hyperkalemia Secondary to AKI Resolved with Lokelma /bicarb infusion  Acute urinary retention Continue Foley Start Flomax   Acute left pontine infarct versus artifactual finding on MRI No focal deficits Reviewed neurology input on 8/18-favored to be artifactual-given lack of symptoms Continue aspirin  per neurology recommendation.  Acute metabolic encephalopathy 2/2 AKI/Bacteremia Delirium precaution Neuroimaging as abovve  Addendum Called to bedside by RN-as patient confused-family was worried Slightly confused-able to tell me her name-son's name-following commands-moving all 4 extremities symmetrically with good strength. Suspect sundowning/hospital delirium-along with acute metabolic encephalopathy due to underlying metabolic issues Supportive care Delirium precautions Add melatonin As needed Seroquel  nightly.  DM-2 CBG stable SSI  Hypertension BP stable Lasix/HCTZ on hold  HLD Statin.  Code status:   Code Status: Full Code   DVT Prophylaxis: heparin  injection 5,000 Units Start: 06/14/24 2200   Family Communication: Son-[phone] -left VM 8/19   Disposition Plan: Status is: Inpatient Remains inpatient appropriate because: Severity of illness   Planned Discharge Destination:Rehabilitation facility  Diet: Diet Order             Diet renal  with fluid restriction Fluid restriction: 1200 mL Fluid; Room service appropriate? Yes; Fluid consistency: Thin  Diet effective now                     Antimicrobial agents: Anti-infectives (From admission, onward)    Start     Dose/Rate Route Frequency Ordered Stop   06/15/24 1800  cefTRIAXone  (ROCEPHIN ) 2 g in sodium chloride  0.9 % 100 mL IVPB        2 g 200 mL/hr over 30 Minutes Intravenous Daily-1800 06/14/24 2050     06/14/24 1800  ceFEPIme  (MAXIPIME ) 2 g in sodium chloride  0.9 % 100 mL IVPB        2 g 200 mL/hr over 30 Minutes Intravenous  Once 06/14/24 1755 06/14/24 1919   06/14/24 1800  metroNIDAZOLE  (FLAGYL ) IVPB 500 mg        500 mg 100 mL/hr over 60 Minutes Intravenous  Once 06/14/24 1755 06/14/24 1949   06/14/24 1800  vancomycin  (VANCOCIN ) IVPB 1000 mg/200 mL premix        1,000 mg 200 mL/hr over 60 Minutes Intravenous  Once 06/14/24 1755 06/14/24 1949        MEDICATIONS: Scheduled Meds:   stroke: early stages of recovery book   Does not apply Once   aspirin  EC  81 mg Oral Daily   atorvastatin   40 mg Oral QHS   heparin   5,000 Units Subcutaneous Q8H   insulin  aspart  0-9 Units Subcutaneous TID WC   Continuous Infusions:  cefTRIAXone  (ROCEPHIN )  IV 2 g (06/15/24 1735)   sodium bicarbonate  150 mEq in sterile water  1,150 mL infusion 150 mL/hr at 06/16/24 0737   PRN Meds:.acetaminophen  **OR** acetaminophen  (TYLENOL ) oral liquid 160 mg/5 mL **OR** acetaminophen , ondansetron  (ZOFRAN ) IV, senna-docusate   I have personally reviewed following labs and imaging studies  LABORATORY DATA: CBC: Recent Labs  Lab 06/14/24 1507 06/15/24 0604 06/16/24 0545  WBC 18.6* 19.9* 18.0*  NEUTROABS 15.2*  --  14.2*  HGB 10.1* 9.7* 9.7*  HCT 29.5* 27.7* 26.2*  MCV 76.8* 75.5* 71.4*  PLT 302 237 223    Basic Metabolic Panel: Recent Labs  Lab 06/14/24 1507 06/14/24 1620 06/15/24 0604 06/16/24 0545  NA 130* 129* 133* 136  K 6.4* 6.0* 4.8 3.5  CL 96* 98 107 97*   CO2 13* 13* 13* 23  GLUCOSE 323* 274* 112* 72  BUN 130* 135* 95* 77*  CREATININE 9.52* 9.40* 7.51* 6.52*  CALCIUM  9.4 9.1 8.3* 7.8*  PHOS  --   --  4.0 4.0    GFR: Estimated Creatinine Clearance: 4.9 mL/min (A) (by C-G formula based on SCr of 6.52 mg/dL (H)).  Liver Function Tests: Recent Labs  Lab 06/14/24 1620 06/15/24 0604 06/16/24 0545  AST 52*  --   --   ALT 31  --   --   ALKPHOS 165*  --   --   BILITOT 1.1  --   --   PROT 8.2*  --   --   ALBUMIN 2.8* 1.9* 1.6*   No results for input(s): LIPASE, AMYLASE in the last 168 hours. No results for input(s): AMMONIA in the last 168 hours.  Coagulation Profile: Recent Labs  Lab 06/14/24 1619  INR 1.3*    Cardiac Enzymes: No results for input(s): CKTOTAL, CKMB, CKMBINDEX, TROPONINI in the last 168 hours.  BNP (last 3 results) No results  for input(s): PROBNP in the last 8760 hours.  Lipid Profile: Recent Labs    06/15/24 0604  CHOL 60  HDL <10*  LDLCALC NOT CALCULATED  TRIG 69  CHOLHDL NOT CALCULATED    Thyroid Function Tests: No results for input(s): TSH, T4TOTAL, FREET4, T3FREE, THYROIDAB in the last 72 hours.  Anemia Panel: No results for input(s): VITAMINB12, FOLATE, FERRITIN, TIBC, IRON, RETICCTPCT in the last 72 hours.  Urine analysis:    Component Value Date/Time   COLORURINE AMBER (A) 06/14/2024 1726   APPEARANCEUR TURBID (A) 06/14/2024 1726   LABSPEC 1.014 06/14/2024 1726   PHURINE 5.0 06/14/2024 1726   GLUCOSEU 150 (A) 06/14/2024 1726   HGBUR MODERATE (A) 06/14/2024 1726   BILIRUBINUR NEGATIVE 06/14/2024 1726   BILIRUBINUR negative 06/01/2022 1010   KETONESUR NEGATIVE 06/14/2024 1726   PROTEINUR 100 (A) 06/14/2024 1726   UROBILINOGEN 0.2 06/01/2022 1010   NITRITE NEGATIVE 06/14/2024 1726   LEUKOCYTESUR SMALL (A) 06/14/2024 1726    Sepsis Labs: Lactic Acid, Venous    Component Value Date/Time   LATICACIDVEN 1.1 06/16/2024 0545     MICROBIOLOGY: Recent Results (from the past 240 hours)  Blood Culture (routine x 2)     Status: None (Preliminary result)   Collection Time: 06/14/24  4:19 PM   Specimen: BLOOD RIGHT FOREARM  Result Value Ref Range Status   Specimen Description   Final    BLOOD RIGHT FOREARM Performed at [facility], [address], [place], [place] [address]    Special Requests   Final    BOTTLES DRAWN AEROBIC AND ANAEROBIC Blood Culture adequate volume Performed at [facility], [address] [place], [place] [address]    Culture  Setup Time   Final    GRAM NEGATIVE RODS IN BOTH AEROBIC AND ANAEROBIC BOTTLES CRITICAL VALUE NOTED.  VALUE IS CONSISTENT WITH PREVIOUSLY REPORTED AND CALLED VALUE. Performed at [facility], [address], [place], [place] [address]    Culture GRAM NEGATIVE RODS  Final   Report Status PENDING  Incomplete  Blood Culture (routine x 2)     Status: None (Preliminary result)   Collection Time: 06/14/24  4:49 PM   Specimen: Left Antecubital; Blood  Result Value Ref Range Status   Specimen Description LEFT ANTECUBITAL BLOOD  Final   Special Requests   Final    Blood Culture adequate volume BOTTLES DRAWN AEROBIC ONLY   Culture  Setup Time   Final    GRAM NEGATIVE RODS AEROBIC BOTTLE ONLY CRITICAL RESULT CALLED TO, READ BACK BY AND VERIFIED WITH: PHARMD J.LEGGE AT 0830 ON 06/15/2024 BY T.SAAD. Performed at [facility], [address] [place], [place] [address]    Culture GRAM NEGATIVE RODS  Final   Report Status PENDING  Incomplete  Blood Culture ID Panel (Reflexed)     Status: Abnormal   Collection Time: 06/14/24  4:49 PM  Result Value Ref Range Status   Enterococcus faecalis NOT DETECTED NOT DETECTED Final   Enterococcus Faecium NOT DETECTED NOT DETECTED Final   Listeria monocytogenes NOT DETECTED NOT DETECTED Final   Staphylococcus species NOT DETECTED NOT DETECTED Final   Staphylococcus aureus (BCID) NOT DETECTED NOT  DETECTED Final   Staphylococcus epidermidis NOT DETECTED NOT DETECTED Final   Staphylococcus lugdunensis NOT DETECTED NOT DETECTED Final   Streptococcus species NOT DETECTED NOT DETECTED Final   Streptococcus agalactiae NOT DETECTED NOT DETECTED Final   Streptococcus pneumoniae NOT DETECTED NOT DETECTED Final  Streptococcus pyogenes NOT DETECTED NOT DETECTED Final   A.calcoaceticus-baumannii NOT DETECTED NOT DETECTED Final   Bacteroides fragilis NOT DETECTED NOT DETECTED Final   Enterobacterales DETECTED (A) NOT DETECTED Final    Comment: Enterobacterales represent a large order of gram negative bacteria, not a single organism. CRITICAL RESULT CALLED TO, READ BACK BY AND VERIFIED WITH: PHARMD J.LEGGE AT 0830 ON 06/15/2024 BY T.SAAD.    Enterobacter cloacae complex NOT DETECTED NOT DETECTED Final   Escherichia coli NOT DETECTED NOT DETECTED Final   Klebsiella aerogenes NOT DETECTED NOT DETECTED Final   Klebsiella oxytoca NOT DETECTED NOT DETECTED Final   Klebsiella pneumoniae DETECTED (A) NOT DETECTED Final    Comment: CRITICAL RESULT CALLED TO, READ BACK BY AND VERIFIED WITH: PHARMD J.LEGGE AT 0830 ON 06/15/2024 BY T.SAAD.    Proteus species NOT DETECTED NOT DETECTED Final   Salmonella species NOT DETECTED NOT DETECTED Final   Serratia marcescens NOT DETECTED NOT DETECTED Final   Haemophilus influenzae NOT DETECTED NOT DETECTED Final   Neisseria meningitidis NOT DETECTED NOT DETECTED Final   Pseudomonas aeruginosa NOT DETECTED NOT DETECTED Final   Stenotrophomonas maltophilia NOT DETECTED NOT DETECTED Final   Candida albicans NOT DETECTED NOT DETECTED Final   Candida auris NOT DETECTED NOT DETECTED Final   Candida glabrata NOT DETECTED NOT DETECTED Final   Candida krusei NOT DETECTED NOT DETECTED Final   Candida parapsilosis NOT DETECTED NOT DETECTED Final   Candida tropicalis NOT DETECTED NOT DETECTED Final   Cryptococcus neoformans/gattii NOT DETECTED NOT DETECTED Final   CTX-M  ESBL NOT DETECTED NOT DETECTED Final   Carbapenem resistance IMP NOT DETECTED NOT DETECTED Final   Carbapenem resistance KPC NOT DETECTED NOT DETECTED Final   Carbapenem resistance NDM NOT DETECTED NOT DETECTED Final   Carbapenem resist OXA 48 LIKE NOT DETECTED NOT DETECTED Final   Carbapenem resistance VIM NOT DETECTED NOT DETECTED Final    Comment: Performed at [facility], [address], [place], [place] [address]  Urine Culture     Status: Abnormal (Preliminary result)   Collection Time: 06/14/24  5:26 PM   Specimen: Urine, Random  Result Value Ref Range Status   Specimen Description   Final    URINE, RANDOM Performed at [facility], [address] [place], [place] [address]    Special Requests   Final    NONE Reflexed from [phone] Performed at [facility], [address], [place], [place] [address]    Culture (A)  Final    >=100,000 COLONIES/mL GRAM NEGATIVE RODS SUSCEPTIBILITIES TO FOLLOW Performed at [name], [address] [place], [place] [address]    Report Status PENDING  Incomplete    RADIOLOGY STUDIES/RESULTS: ECHOCARDIOGRAM COMPLETE Result Date: 06/15/2024    ECHOCARDIOGRAM REPORT   Patient Name:   KRITI KATAYAMA Date of Exam: 06/15/2024 Medical Rec #:  [id number]         Height:       61.0 in Accession #:    [id number]        Weight:       91.0 lb Date of Birth:  February 06, 1941         BSA:          1.352 m Patient Age:    83 years          BP:           152/59 mmHg Patient Gender: F  HR:           111 bpm. Exam Location:  Inpatient Procedure: 2D Echo, Color Doppler and Cardiac Doppler (Both Spectral and Color            Flow Doppler were utilized during procedure). Indications:    Stroke  History:        Patient has prior history of Echocardiogram examinations, most                 recent 04/11/2024. Risk Factors:Diabetes.  Sonographer:    Benard Stallion Referring Phys: [id number] VISHAL R PATEL  IMPRESSIONS  1. Left ventricular ejection fraction, by estimation, is 65 to 70%. The left ventricle has normal function. The left ventricle has no regional wall motion abnormalities. Left ventricular diastolic parameters are consistent with Grade I diastolic dysfunction (impaired relaxation).  2. Right ventricular systolic function is normal. The right ventricular size is normal. There is mildly elevated pulmonary artery systolic pressure. The estimated right ventricular systolic pressure is 37.1 mmHg.  3. The mitral valve is normal in structure. No evidence of mitral valve regurgitation. No evidence of mitral stenosis.  4. The aortic valve is tricuspid. There is mild calcification of the aortic valve. Aortic valve regurgitation is not visualized. No aortic stenosis is present.  5. The inferior vena cava is normal in size with greater than 50% respiratory variability, suggesting right atrial pressure of 3 mmHg. FINDINGS  Left Ventricle: Left ventricular ejection fraction, by estimation, is 65 to 70%. The left ventricle has normal function. The left ventricle has no regional wall motion abnormalities. The left ventricular internal cavity size was normal in size. There is  no left ventricular hypertrophy. Left ventricular diastolic parameters are consistent with Grade I diastolic dysfunction (impaired relaxation). Right Ventricle: The right ventricular size is normal. No increase in right ventricular wall thickness. Right ventricular systolic function is normal. There is mildly elevated pulmonary artery systolic pressure. The tricuspid regurgitant velocity is 2.92  m/s, and with an assumed right atrial pressure of 3 mmHg, the estimated right ventricular systolic pressure is 37.1 mmHg. Left Atrium: Left atrial size was normal in size. Right Atrium: Right atrial size was normal in size. Pericardium: There is no evidence of pericardial effusion. Mitral Valve: The mitral valve is normal in structure. Mild mitral annular  calcification. No evidence of mitral valve regurgitation. No evidence of mitral valve stenosis. MV peak gradient, 8.9 mmHg. The mean mitral valve gradient is 4.0 mmHg. Tricuspid Valve: The tricuspid valve is normal in structure. Tricuspid valve regurgitation is trivial. Aortic Valve: The aortic valve is tricuspid. There is mild calcification of the aortic valve. Aortic valve regurgitation is not visualized. No aortic stenosis is present. Aortic valve mean gradient measures 5.5 mmHg. Aortic valve peak gradient measures 9.7 mmHg. Aortic valve area, by VTI measures 2.09 cm. Pulmonic Valve: The pulmonic valve was normal in structure. Pulmonic valve regurgitation is not visualized. Aorta: The aortic root is normal in size and structure. Venous: The inferior vena cava is normal in size with greater than 50% respiratory variability, suggesting right atrial pressure of 3 mmHg. IAS/Shunts: No atrial level shunt detected by color flow Doppler.  LEFT VENTRICLE PLAX 2D LVIDd:         3.10 cm   Diastology LVIDs:         1.90 cm   LV e' medial:    8.27 cm/s LV PW:         0.90 cm   LV E/e' medial:  9.9 LV IVS:        0.90 cm   LV e' lateral:   7.07 cm/s LVOT diam:     1.70 cm   LV E/e' lateral: 11.6 LV SV:         48 LV SV Index:   35 LVOT Area:     2.27 cm  RIGHT VENTRICLE RV Basal diam:  3.30 cm RV Mid diam:    2.60 cm RV S prime:     19.00 cm/s TAPSE (M-mode): 2.0 cm LEFT ATRIUM             Index        RIGHT ATRIUM          Index LA diam:        2.50 cm 1.85 cm/m   RA Area:     8.01 cm LA Vol (A2C):   25.2 ml 18.65 ml/m  RA Volume:   14.80 ml 10.95 ml/m LA Vol (A4C):   32.6 ml 24.12 ml/m LA Biplane Vol: 31.4 ml 23.23 ml/m  AORTIC VALVE AV Area (Vmax):    2.05 cm AV Area (Vmean):   1.96 cm AV Area (VTI):     2.09 cm AV Vmax:           156.00 cm/s AV Vmean:          109.000 cm/s AV VTI:            0.229 m AV Peak Grad:      9.7 mmHg AV Mean Grad:      5.5 mmHg LVOT Vmax:         141.00 cm/s LVOT Vmean:        94.000  cm/s LVOT VTI:          0.211 m LVOT/AV VTI ratio: 0.92  AORTA Ao Root diam: 2.40 cm MITRAL VALVE                TRICUSPID VALVE MV Area (PHT): 6.54 cm     TR Peak grad:   34.1 mmHg MV Area VTI:   2.13 cm     TR Vmax:        292.00 cm/s MV Peak grad:  8.9 mmHg MV Mean grad:  4.0 mmHg     SHUNTS MV Vmax:       1.49 m/s     Systemic VTI:  0.21 m MV Vmean:      90.2 cm/s    Systemic Diam: 1.70 cm MV Decel Time: 116 msec MV E velocity: 81.90 cm/s MV A velocity: 162.00 cm/s MV E/A ratio:  0.51 Dalton McleanMD Electronically signed by Ezra Kanner Signature Date/Time: 06/15/2024/8:28:47 PM    Final    US  RENAL Result Date: 06/14/2024 CLINICAL DATA:  Acute renal failure superimposed on stage 3 chronic kidney disease. EXAM: RENAL / URINARY TRACT ULTRASOUND COMPLETE COMPARISON:  Ultrasound 04/24/2024 FINDINGS: Right Kidney: Renal measurements: 10.9 x 5.7 x 5.5 cm = volume: 179 mL. Increased cortical echogenicity. 1.1 x 1.0 x 0.8 cm cyst. No follow-up recommended. No hydronephrosis. Left Kidney: Renal measurements: 9.9 x 5.2 x 5.6 cm = volume: 152 mL. Increased cortical echogenicity. No mass or hydronephrosis. Bladder: Foley catheter in the decompressed bladder. Other: None. IMPRESSION: Increased cortical echogenicity bilaterally compatible with medical renal disease. No hydronephrosis. Electronically Signed   By: Norman Gatlin M.D.   On: 06/14/2024 21:04   MR BRAIN WO CONTRAST Result Date: 06/14/2024 CLINICAL DATA:  Mental status change, unknown cause. EXAM: MRI HEAD  WITHOUT CONTRAST TECHNIQUE: Multiplanar, multiecho pulse sequences of the brain and surrounding structures were obtained without intravenous contrast. COMPARISON:  Head MRI 03/26/2024 FINDINGS: Brain: A punctate acute infarct is noted in the left pons. Patchy T2 hyperintensities in the cerebral white matter bilaterally are similar to the prior MRI and are nonspecific but compatible with moderate chronic small vessel ischemic disease. A chronic  lacunar infarct at the posterior aspect of the left putamen is unchanged. There is mild generalized cerebral atrophy. Vascular: Major intracranial vascular flow voids are preserved. Skull and upper cervical spine: Unremarkable bone marrow signal. Sinuses/Orbits: Bilateral cataract extraction. Minimal mucosal thickening in the paranasal sinuses. Trace bilateral mastoid fluid. Other: None. IMPRESSION: 1. Punctate acute left pontine infarct. 2. Moderate chronic small vessel ischemic disease. Electronically Signed   By: Dasie Hamburg M.D.   On: 06/14/2024 19:11   DG Chest Port 1 View Result Date: 06/14/2024 CLINICAL DATA:  Lethargy possible sepsis EXAM: PORTABLE CHEST 1 VIEW COMPARISON:  04/10/2024 FINDINGS: The heart size and mediastinal contours are within normal limits. Both lungs are clear. The visualized skeletal structures are unremarkable. Aortic atherosclerosis. Multiple skin fold artifact over left chest. IMPRESSION: No active disease. Electronically Signed   By: Luke Bun M.D.   On: 06/14/2024 17:00   DG Knee Complete 4 Views Right Result Date: 06/14/2024 CLINICAL DATA:  Right knee pain EXAM: RIGHT KNEE - COMPLETE 4+ VIEW COMPARISON:  None Available. FINDINGS: No evidence of fracture, dislocation, or joint effusion. No evidence of arthropathy or other focal bone abnormality. Soft tissues are unremarkable. IMPRESSION: No acute osseous findings. Electronically Signed   By: Michaeline Blanch M.D.   On: 06/14/2024 15:12   DG Tibia/Fibula Right Result Date: 06/14/2024 CLINICAL DATA:  Pain EXAM: RIGHT TIBIA AND FIBULA - 2 VIEW COMPARISON:  Same day knee radiographs FINDINGS: There is no evidence of fracture or other focal bone lesions. Scattered vascular calcifications. No soft tissue swelling. IMPRESSION: No acute osseous findings. Electronically Signed   By: Michaeline Blanch M.D.   On: 06/14/2024 15:11     LOS: 2 days   Donalda Applebaum, MD  [facility]    To contact the attending provider  between 7A-7P or the covering provider during after hours 7P-7A, please log into the web site [email or web address] and access using universal [place] password for that web site. If you do not have the password, please call the hospital operator.  06/16/2024, 8:55 AM

## 2024-06-17 ENCOUNTER — Other Ambulatory Visit: Payer: Self-pay

## 2024-06-17 ENCOUNTER — Inpatient Hospital Stay (HOSPITAL_COMMUNITY)
Admission: AD | Admit: 2024-06-17 | Discharge: 2024-06-30 | DRG: 056 | Disposition: A | Discharge status: Home / Self Care | Source: Intra-hospital | Attending: Physical Medicine and Rehabilitation | Admitting: Physical Medicine and Rehabilitation

## 2024-06-17 DIAGNOSIS — I639 Cerebral infarction, unspecified: Principal | ICD-10-CM | POA: Diagnosis present

## 2024-06-17 DIAGNOSIS — Z820 Family history of epilepsy and other diseases of the nervous system: Secondary | ICD-10-CM | POA: Diagnosis not present

## 2024-06-17 DIAGNOSIS — Z681 Body mass index (BMI) 19 or less, adult: Secondary | ICD-10-CM

## 2024-06-17 DIAGNOSIS — Z8249 Family history of ischemic heart disease and other diseases of the circulatory system: Secondary | ICD-10-CM | POA: Diagnosis not present

## 2024-06-17 DIAGNOSIS — E875 Hyperkalemia: Secondary | ICD-10-CM | POA: Diagnosis present

## 2024-06-17 DIAGNOSIS — R339 Retention of urine, unspecified: Secondary | ICD-10-CM | POA: Diagnosis not present

## 2024-06-17 DIAGNOSIS — E1169 Type 2 diabetes mellitus with other specified complication: Secondary | ICD-10-CM | POA: Diagnosis present

## 2024-06-17 DIAGNOSIS — Z809 Family history of malignant neoplasm, unspecified: Secondary | ICD-10-CM | POA: Diagnosis not present

## 2024-06-17 DIAGNOSIS — E43 Unspecified severe protein-calorie malnutrition: Secondary | ICD-10-CM | POA: Diagnosis present

## 2024-06-17 DIAGNOSIS — E1122 Type 2 diabetes mellitus with diabetic chronic kidney disease: Secondary | ICD-10-CM | POA: Diagnosis present

## 2024-06-17 DIAGNOSIS — R338 Other retention of urine: Secondary | ICD-10-CM | POA: Diagnosis present

## 2024-06-17 DIAGNOSIS — Z833 Family history of diabetes mellitus: Secondary | ICD-10-CM

## 2024-06-17 DIAGNOSIS — K219 Gastro-esophageal reflux disease without esophagitis: Secondary | ICD-10-CM | POA: Diagnosis present

## 2024-06-17 DIAGNOSIS — I152 Hypertension secondary to endocrine disorders: Secondary | ICD-10-CM | POA: Diagnosis present

## 2024-06-17 DIAGNOSIS — E860 Dehydration: Secondary | ICD-10-CM | POA: Diagnosis present

## 2024-06-17 DIAGNOSIS — E785 Hyperlipidemia, unspecified: Secondary | ICD-10-CM | POA: Diagnosis present

## 2024-06-17 DIAGNOSIS — I6302 Cerebral infarction due to thrombosis of basilar artery: Secondary | ICD-10-CM | POA: Diagnosis not present

## 2024-06-17 DIAGNOSIS — A419 Sepsis, unspecified organism: Secondary | ICD-10-CM | POA: Diagnosis not present

## 2024-06-17 DIAGNOSIS — E1159 Type 2 diabetes mellitus with other circulatory complications: Secondary | ICD-10-CM | POA: Diagnosis present

## 2024-06-17 DIAGNOSIS — Z7982 Long term (current) use of aspirin: Secondary | ICD-10-CM

## 2024-06-17 DIAGNOSIS — N390 Urinary tract infection, site not specified: Secondary | ICD-10-CM | POA: Diagnosis present

## 2024-06-17 DIAGNOSIS — D649 Anemia, unspecified: Secondary | ICD-10-CM | POA: Diagnosis present

## 2024-06-17 DIAGNOSIS — D75839 Thrombocytosis, unspecified: Secondary | ICD-10-CM | POA: Diagnosis present

## 2024-06-17 DIAGNOSIS — R188 Other ascites: Secondary | ICD-10-CM | POA: Diagnosis present

## 2024-06-17 DIAGNOSIS — Z794 Long term (current) use of insulin: Secondary | ICD-10-CM

## 2024-06-17 DIAGNOSIS — N179 Acute kidney failure, unspecified: Secondary | ICD-10-CM | POA: Diagnosis present

## 2024-06-17 DIAGNOSIS — Z79899 Other long term (current) drug therapy: Secondary | ICD-10-CM | POA: Diagnosis not present

## 2024-06-17 DIAGNOSIS — R471 Dysarthria and anarthria: Secondary | ICD-10-CM | POA: Diagnosis present

## 2024-06-17 DIAGNOSIS — N1832 Chronic kidney disease, stage 3b: Secondary | ICD-10-CM | POA: Diagnosis present

## 2024-06-17 DIAGNOSIS — I69391 Dysphagia following cerebral infarction: Principal | ICD-10-CM

## 2024-06-17 DIAGNOSIS — R1310 Dysphagia, unspecified: Secondary | ICD-10-CM

## 2024-06-17 DIAGNOSIS — N1831 Chronic kidney disease, stage 3a: Secondary | ICD-10-CM | POA: Diagnosis present

## 2024-06-17 DIAGNOSIS — D509 Iron deficiency anemia, unspecified: Secondary | ICD-10-CM | POA: Diagnosis present

## 2024-06-17 DIAGNOSIS — N183 Chronic kidney disease, stage 3 unspecified: Secondary | ICD-10-CM

## 2024-06-17 DIAGNOSIS — I7090 Unspecified atherosclerosis: Secondary | ICD-10-CM | POA: Diagnosis present

## 2024-06-17 DIAGNOSIS — E1165 Type 2 diabetes mellitus with hyperglycemia: Secondary | ICD-10-CM | POA: Diagnosis present

## 2024-06-17 DIAGNOSIS — R7401 Elevation of levels of liver transaminase levels: Secondary | ICD-10-CM | POA: Insufficient documentation

## 2024-06-17 LAB — CULTURE, BLOOD (ROUTINE X 2)
Special Requests: ADEQUATE
Special Requests: ADEQUATE

## 2024-06-17 LAB — RENAL FUNCTION PANEL
Albumin: 1.7 g/dL — ABNORMAL LOW (ref 3.5–5.0)
Anion gap: 17 — ABNORMAL HIGH (ref 5–15)
BUN: 68 mg/dL — ABNORMAL HIGH (ref 8–23)
CO2: 25 mmol/L (ref 22–32)
Calcium: 8.2 mg/dL — ABNORMAL LOW (ref 8.9–10.3)
Chloride: 92 mmol/L — ABNORMAL LOW (ref 98–111)
Creatinine, Ser: 5.52 mg/dL — ABNORMAL HIGH (ref 0.44–1.00)
GFR, Estimated: 7 mL/min — ABNORMAL LOW (ref >60)
Glucose, Bld: 304 mg/dL — ABNORMAL HIGH (ref 70–99)
Phosphorus: 3.7 mg/dL (ref 2.5–4.6)
Potassium: 3.5 mmol/L (ref 3.5–5.1)
Sodium: 134 mmol/L — ABNORMAL LOW (ref 135–145)

## 2024-06-17 LAB — CBC
HCT: 24.5 % — ABNORMAL LOW (ref 36.0–46.0)
Hemoglobin: 8.9 g/dL — ABNORMAL LOW (ref 12.0–15.0)
MCH: 26.3 pg (ref 26.0–34.0)
MCHC: 36.3 g/dL — ABNORMAL HIGH (ref 30.0–36.0)
MCV: 72.5 fL — ABNORMAL LOW (ref 80.0–100.0)
Platelets: 224 K/uL (ref 150–400)
RBC: 3.38 MIL/uL — ABNORMAL LOW (ref 3.87–5.11)
RDW: 14.3 % (ref 11.5–15.5)
WBC: 15.1 K/uL — ABNORMAL HIGH (ref 4.0–10.5)
nRBC: 0 % (ref 0.0–0.2)

## 2024-06-17 LAB — URINE CULTURE: Culture: >=100000 — AB

## 2024-06-17 LAB — GLUCOSE, CAPILLARY
Glucose-Capillary: 274 mg/dL — ABNORMAL HIGH (ref 70–99)
Glucose-Capillary: 319 mg/dL — ABNORMAL HIGH (ref 70–99)
Glucose-Capillary: 235 mg/dL — ABNORMAL HIGH (ref 70–99)
Glucose-Capillary: 116 mg/dL — ABNORMAL HIGH (ref 70–99)

## 2024-06-17 MED ORDER — MELATONIN 5 MG PO TABS
5.0000 mg | ORAL_TABLET | Freq: Every day | ORAL | Status: DC
  Administered 2024-06-17 – 2024-06-29 (×13): 5 mg via ORAL
  Filled 2024-06-17 (×13): qty 1

## 2024-06-17 MED ORDER — GUAIFENESIN-DM 100-10 MG/5ML PO SYRP: 5.0000 mL | ORAL_SOLUTION | Freq: Four times a day (QID) | ORAL | Status: DC | PRN

## 2024-06-17 MED ORDER — CEPHALEXIN 500 MG PO CAPS
500.0000 mg | ORAL_CAPSULE | Freq: Two times a day (BID) | ORAL | Status: DC
  Administered 2024-06-17: 500 mg via ORAL
  Filled 2024-06-17: qty 1

## 2024-06-17 MED ORDER — ATORVASTATIN CALCIUM 40 MG PO TABS
40.0000 mg | ORAL_TABLET | Freq: Every day | ORAL | Status: DC
  Administered 2024-06-17 – 2024-06-29 (×13): 40 mg via ORAL
  Filled 2024-06-17 (×13): qty 1

## 2024-06-17 MED ORDER — VITAMIN B-1 100 MG PO TABS: 100.0000 mg | ORAL_TABLET | Freq: Every day | ORAL | Status: AC

## 2024-06-17 MED ORDER — ADULT MULTIVITAMIN W/MINERALS CH: 1.0000 | ORAL_TABLET | Freq: Every day | ORAL | Status: DC

## 2024-06-17 MED ORDER — LACTATED RINGERS IV SOLN: INTRAVENOUS | Status: DC

## 2024-06-17 MED ORDER — MELATONIN 5 MG PO TABS: 5.0000 mg | ORAL_TABLET | Freq: Every day | ORAL | Status: DC

## 2024-06-17 MED ORDER — NOVOLOG FLEXPEN 100 UNIT/ML ~~LOC~~ SOPN: PEN_INJECTOR | SUBCUTANEOUS | Status: DC

## 2024-06-17 MED ORDER — ALUM & MAG HYDROXIDE-SIMETH 200-200-20 MG/5ML PO SUSP
30.0000 mL | ORAL | Status: DC | PRN
Start: 2024-06-17 — End: 2024-06-30

## 2024-06-17 MED ORDER — ADULT MULTIVITAMIN W/MINERALS CH
1.0000 | ORAL_TABLET | Freq: Every day | ORAL | Status: DC
  Administered 2024-06-18 – 2024-06-30 (×13): 1 via ORAL
  Filled 2024-06-17 (×13): qty 1

## 2024-06-17 MED ORDER — THIAMINE MONONITRATE 100 MG PO TABS
100.0000 mg | ORAL_TABLET | Freq: Every day | ORAL | Status: DC
  Administered 2024-06-18 – 2024-06-30 (×13): 100 mg via ORAL
  Filled 2024-06-17 (×13): qty 1

## 2024-06-17 MED ORDER — DIPHENHYDRAMINE HCL 25 MG PO CAPS: 25.0000 mg | ORAL_CAPSULE | Freq: Four times a day (QID) | ORAL | Status: DC | PRN

## 2024-06-17 MED ORDER — TAMSULOSIN HCL 0.4 MG PO CAPS: 0.4000 mg | ORAL_CAPSULE | Freq: Every day | ORAL | Status: DC

## 2024-06-17 MED ORDER — CEPHALEXIN 500 MG PO CAPS: 500.0000 mg | ORAL_CAPSULE | Freq: Two times a day (BID) | ORAL | Status: DC

## 2024-06-17 MED ORDER — HEPARIN SODIUM (PORCINE) 5000 UNIT/ML IJ SOLN
5000.0000 [IU] | Freq: Three times a day (TID) | INTRAMUSCULAR | Status: DC
  Administered 2024-06-17 – 2024-06-30 (×38): 5000 [IU] via SUBCUTANEOUS
  Filled 2024-06-17 (×38): qty 1

## 2024-06-17 MED ORDER — TAMSULOSIN HCL 0.4 MG PO CAPS
0.4000 mg | ORAL_CAPSULE | Freq: Every day | ORAL | Status: DC
  Administered 2024-06-18: 0.4 mg via ORAL
  Filled 2024-06-17: qty 1

## 2024-06-17 MED ORDER — LIDOCAINE HCL URETHRAL/MUCOSAL 2 % EX GEL
CUTANEOUS | Status: DC | PRN
  Administered 2024-06-25 – 2024-06-29 (×10): 6 via TOPICAL
  Filled 2024-06-17 (×11): qty 6

## 2024-06-17 MED ORDER — INSULIN ASPART 100 UNIT/ML IJ SOLN: 0.0000 [IU] | Freq: Three times a day (TID) | INTRAMUSCULAR | Status: DC

## 2024-06-17 MED ORDER — METOPROLOL TARTRATE 25 MG PO TABS
25.0000 mg | ORAL_TABLET | Freq: Two times a day (BID) | ORAL | Status: DC
  Administered 2024-06-17 – 2024-06-30 (×25): 25 mg via ORAL
  Filled 2024-06-17 (×26): qty 1

## 2024-06-17 MED ORDER — PROCHLORPERAZINE 25 MG RE SUPP: 12.5000 mg | Freq: Four times a day (QID) | RECTAL | Status: DC | PRN

## 2024-06-17 MED ORDER — ASPIRIN 81 MG PO TBEC: 81.0000 mg | DELAYED_RELEASE_TABLET | Freq: Every day | ORAL | Status: AC

## 2024-06-17 MED ORDER — ACETAMINOPHEN 325 MG PO TABS
325.0000 mg | ORAL_TABLET | ORAL | Status: DC | PRN
  Administered 2024-06-17 – 2024-06-24 (×5): 650 mg via ORAL
  Administered 2024-06-25: 325 mg via ORAL
  Administered 2024-06-26 – 2024-06-28 (×2): 650 mg via ORAL
  Filled 2024-06-17 (×10): qty 2

## 2024-06-17 MED ORDER — METOPROLOL TARTRATE 25 MG PO TABS: 25.0000 mg | ORAL_TABLET | Freq: Two times a day (BID) | ORAL | Status: DC

## 2024-06-17 MED ORDER — QUETIAPINE FUMARATE 25 MG PO TABS: 25.0000 mg | ORAL_TABLET | Freq: Every evening | ORAL | Status: DC | PRN

## 2024-06-17 MED ORDER — SENNOSIDES-DOCUSATE SODIUM 8.6-50 MG PO TABS
1.0000 | ORAL_TABLET | Freq: Every evening | ORAL | Status: DC | PRN
  Administered 2024-06-21: 1 via ORAL
  Filled 2024-06-17 (×2): qty 1

## 2024-06-17 MED ORDER — CHLORHEXIDINE GLUCONATE CLOTH 2 % EX PADS
6.0000 | MEDICATED_PAD | Freq: Every day | CUTANEOUS | Status: DC
  Administered 2024-06-18 – 2024-06-24 (×6): 6 via TOPICAL

## 2024-06-17 MED ORDER — PROCHLORPERAZINE MALEATE 5 MG PO TABS
5.0000 mg | ORAL_TABLET | Freq: Four times a day (QID) | ORAL | Status: DC | PRN
  Administered 2024-06-17: 5 mg via ORAL
  Filled 2024-06-17: qty 2

## 2024-06-17 MED ORDER — ASPIRIN 81 MG PO TBEC
81.0000 mg | DELAYED_RELEASE_TABLET | Freq: Every day | ORAL | Status: DC
  Administered 2024-06-18: 81 mg via ORAL
  Filled 2024-06-17: qty 1

## 2024-06-17 MED ORDER — CEPHALEXIN 250 MG PO CAPS
500.0000 mg | ORAL_CAPSULE | Freq: Two times a day (BID) | ORAL | Status: DC
  Administered 2024-06-17 – 2024-06-18 (×2): 500 mg via ORAL
  Filled 2024-06-17 (×2): qty 2

## 2024-06-17 MED ORDER — PROCHLORPERAZINE EDISYLATE 10 MG/2ML IJ SOLN
5.0000 mg | Freq: Four times a day (QID) | INTRAMUSCULAR | Status: DC | PRN
  Administered 2024-06-18 – 2024-06-19 (×2): 10 mg via INTRAVENOUS
  Filled 2024-06-17 (×2): qty 2

## 2024-06-17 MED ORDER — INSULIN ASPART 100 UNIT/ML IJ SOLN
0.0000 [IU] | Freq: Three times a day (TID) | INTRAMUSCULAR | Status: DC
  Administered 2024-06-17: 3 [IU] via SUBCUTANEOUS
  Administered 2024-06-18: 2 [IU] via SUBCUTANEOUS
  Administered 2024-06-19: 1 [IU] via SUBCUTANEOUS
  Administered 2024-06-20: 3 [IU] via SUBCUTANEOUS
  Administered 2024-06-21: 1 [IU] via SUBCUTANEOUS
  Administered 2024-06-21: 2 [IU] via SUBCUTANEOUS
  Administered 2024-06-22: 3 [IU] via SUBCUTANEOUS
  Administered 2024-06-23: 2 [IU] via SUBCUTANEOUS

## 2024-06-17 MED ORDER — FLEET ENEMA RE ENEM: 1.0000 | ENEMA | Freq: Once | RECTAL | Status: DC | PRN

## 2024-06-17 MED ORDER — METOPROLOL TARTRATE 25 MG PO TABS
25.0000 mg | ORAL_TABLET | Freq: Two times a day (BID) | ORAL | Status: DC
  Administered 2024-06-17: 25 mg via ORAL
  Filled 2024-06-17: qty 1

## 2024-06-17 MED ORDER — BISACODYL 10 MG RE SUPP
10.0000 mg | Freq: Every day | RECTAL | Status: DC | PRN
  Administered 2024-06-22: 10 mg via RECTAL
  Filled 2024-06-17 (×2): qty 1

## 2024-06-17 MED ORDER — TRAZODONE HCL 50 MG PO TABS
25.0000 mg | ORAL_TABLET | Freq: Every evening | ORAL | Status: DC | PRN
  Administered 2024-06-19 – 2024-06-21 (×2): 50 mg via ORAL
  Filled 2024-06-17 (×4): qty 1

## 2024-06-17 MED ORDER — INSULIN GLARGINE 100 UNIT/ML ~~LOC~~ SOLN
5.0000 [IU] | Freq: Every day | SUBCUTANEOUS | Status: DC
  Administered 2024-06-17 – 2024-06-23 (×7): 5 [IU] via SUBCUTANEOUS
  Filled 2024-06-17 (×8): qty 0.05

## 2024-06-17 MED ORDER — BOOST PLUS PO LIQD
237.0000 mL | Freq: Three times a day (TID) | ORAL | Status: DC
  Administered 2024-06-17 – 2024-06-30 (×36): 237 mL via ORAL
  Filled 2024-06-17 (×40): qty 237

## 2024-06-17 MED ORDER — LACTATED RINGERS IV SOLN
75.0000 mL | INTRAVENOUS | Status: DC
Start: 2024-06-17 — End: 2024-06-30

## 2024-06-17 MED ORDER — BOOST PLUS PO LIQD: 237.0000 mL | Freq: Three times a day (TID) | ORAL | Status: AC

## 2024-06-17 NOTE — Progress Notes (Signed)
 IP rehab admissions - We have approval for acute inpatient rehab admission for today.  I have attending MD okay for DC to inpatient rehab.  Bed available and will admit to CIR today.  [phone]

## 2024-06-17 NOTE — H&P (Shared)
 Physical Medicine and Rehabilitation Admission H&P    Chief Complaint  Patient presents with   Functional deficits due to CVA    HPI: Sara [name] 83 year old female with a past medical history of chronic kidney disease (CKD) stage 3b, type 2 diabetes, hypertension, and hyperlipidemia. The patient presented to the [name] on 06/15/2024 for gait and balance issues that developed over the past 2-3 days. She reported having to brace herself using walls and furniture in her home due to instability. The patient also noted ongoing issues with dysphagia and poor oral intake, leading to several recent admissions for acute kidney injury (AKI).  ED labs: WBC 18.6, hemoglobin 10.1, platelets 302, sodium 130, potassium 6.4, bicarb 13, BUN 130, creatinine 9.52, serum glucose 323, lactic acid 3.9 > 2.3. Patient was found to have severe sepsis due to Klebsiella UTI. Nephrology consulted.  Renal ultrasound without any acute abnormalities.  A Foley catheter was placed on admission and nephrology was consulted where she received bicarb infusion, IVFs, and empiric antibiotics for urosepsis.   Additionally MRI of brain showed punctate acute left pontine infarct and moderate chronic small vessel ischemic disease. Neurology was consulted based on findings and felt CVA to be small and likely ischemic in etiology.  Recommendations were to continue aspirin  81 mg daily and delay CT angio until renal function improved. Transthoracic echo revealed normal ejection fraction grade 1 diastolic dysfunction. Per chart review the patient lives at home with her children in a 1 level home with one-step to enter and has been modified independent use of rollator at home.  She currently transfers sit to stand with min assist and walked 50 feet min assist to CGA. Therapy evaluations completed due to patient decreased functional mobility was admitted for a comprehensive rehab program.    Review of Systems  Constitutional:  Negative.   HENT: Negative.    Eyes: Negative.   Respiratory:  Negative for cough and shortness of breath.   Cardiovascular:  Negative for chest pain, palpitations and leg swelling.  Gastrointestinal:  Negative for abdominal pain and constipation.       ? LBM   Genitourinary:        Foley cath   Musculoskeletal: Negative.   Skin: Negative.   Neurological:  Negative for headaches.  Psychiatric/Behavioral:  The patient has insomnia.      Past Medical History:  Diagnosis Date   Allergy    Dizziness 02/21/2018   Estrogen deficiency 01/13/2018   Need for prophylactic vaccination against Streptococcus pneumoniae (pneumococcus) 01/13/2018   Normocytic anemia 04/11/2024   Right knee pain 05/29/2019   Routine general medical examination at a health care facility 01/13/2018   Seasonal allergies 06/21/2020   Vaginal itching 06/21/2020   Vision changes 01/13/2018   Past Surgical History:  Procedure Laterality Date   DG THUMB RIGHT HAND ([facility] HX) Right    EYE SURGERY     IR GASTROSTOMY TUBE MOD SED  09/05/2022   IR GASTROSTOMY TUBE REMOVAL  12/04/2022   TUBAL LIGATION     Family History  Problem Relation Age of Onset   Heart disease Mother    Diabetes Mother    Other Mother        Visual merchandiser   Alzheimer's disease Father    Cancer Sister    Cancer Brother    Alzheimer's disease Brother    Breast cancer Neg Hx    Social History:  reports that she has never smoked. She has never used  smokeless tobacco. She reports that she does not drink alcohol  and does not use drugs. Allergies: No Known Allergies Medications Prior to Admission  Medication Sig Dispense Refill   cetirizine  (ZYRTEC ) 10 MG tablet Take 1 tablet (10 mg total) by mouth daily as needed for allergies. 30 tablet 11   cholecalciferol (VITAMIN D3) 25 MCG (1000 UNIT) tablet Take 1,000 Units by mouth daily.     folic acid  (FOLVITE ) 1 MG tablet Take 1 tablet (1 mg total) by mouth daily. (Patient taking differently: Take 1 mg by mouth  at bedtime.) 90 tablet 0   furosemide (LASIX) 20 MG tablet Take 20 mg by mouth every morning.     hydrochlorothiazide  (HYDRODIURIL ) 25 MG tablet Take 25 mg by mouth daily. 90 day supply June 2025     hydroxypropyl methylcellulose / hypromellose (ISOPTO TEARS / GONIOVISC) 2.5 % ophthalmic solution Place 1 drop into both eyes 4 (four) times daily as needed for dry eyes.     mirtazapine  (REMERON ) 15 MG tablet Take 15 mg by mouth at bedtime. 90 day supply 03/2024     potassium chloride  (KLOR-CON ) 10 MEQ tablet Take 10 mEq by mouth every morning.     Accu-Chek Softclix Lancets lancets Please use to check blood sugar up to 4 times daily. 400 each 1   atorvastatin  (LIPITOR) 40 MG tablet Place 1 tablet (40 mg total) into feeding tube daily. (Patient taking differently: Take 40 mg by mouth at bedtime.) 90 tablet 0   blood glucose meter kit and supplies KIT Dispense based on patient and insurance preference. Use up to four times daily as directed. 1 each 0   esomeprazole  (NEXIUM ) 10 MG packet Take 10 mg by mouth daily before breakfast. (Patient not taking: Reported on 06/14/2024) 30 each 12   glucose blood (ACCU-CHEK GUIDE) test strip USE 1  4 TIMES DAILY 400 each 0   Insulin  Pen Needle (PEN NEEDLES 3/16) 31G X 5 MM MISC Use as instructed to inject insulin  once daily 90 each 3      Home: Home Living Family/patient expects to be discharged to:: Private residence Living Arrangements: Children Available Help at Discharge: Family Type of Home: House Home Access: Stairs to enter Secretary/administrator of Steps: 1 Home Layout: One level Bathroom Shower/Tub: Engineer, manufacturing systems: Standard Bathroom Accessibility: Yes Home Equipment: Rollator (4 wheels)   Functional History: Prior Function Prior Level of Function : Independent/Modified Independent Mobility Comments: Ambulatory with rollator, denies falls. ADLs Comments: Bathes & dresses without assistance, cooks (cooked ribs for family last  weekend).  Functional Status:  Mobility: Bed Mobility Overal bed mobility: Needs Assistance Bed Mobility: Sit to Supine Supine to sit: Min assist, Contact guard, Used rails, HOB elevated (exit L side of bed) Sit to supine: Min assist General bed mobility comments: OOB in chair Transfers Overall transfer level: Needs assistance Equipment used: Rolling walker (2 wheels) Transfers: Sit to/from Stand Sit to Stand: Min assist General transfer comment: Assist to power up, cues for hand placement Ambulation/Gait Ambulation/Gait assistance: Min assist Gait Distance (Feet): 100 Feet Assistive device: Rollator (4 wheels) Gait Pattern/deviations: Step-through pattern, Decreased stride length, Drifts right/left General Gait Details: Up to minA for dynamic balance due to drift, cues for environmental navigation and maneuvering around obstacles Gait velocity: decreased Gait velocity interpretation: <1.8 ft/sec, indicate of risk for recurrent falls    ADL: ADL Overall ADL's : Needs assistance/impaired Eating/Feeding: Set up, Sitting Grooming: Minimal assistance, Standing, Wash/dry hands Grooming Details (indicate cue type and reason):  for problem solving, attention to task, balance. Upper Body Bathing: Set up, Sitting Lower Body Bathing: Minimal assistance, Sit to/from stand Upper Body Dressing : Set up, Sitting Lower Body Dressing: Minimal assistance, Sit to/from stand Toilet Transfer: Minimal assistance, Cueing for safety, Cueing for sequencing, Rolling walker (2 wheels), Ambulation, Regular Toilet, Grab bars Toileting- Clothing Manipulation and Hygiene: Minimal assistance, Sit to/from stand Functional mobility during ADLs: Rolling walker (2 wheels), Minimal assistance, Cueing for sequencing, Cueing for safety  Cognition: Cognition Orientation Level: Disoriented to situation Cognition Arousal: Alert Behavior During Therapy: WFL for tasks assessed/performed  Physical Exam: Blood  pressure 130/65, pulse 100, temperature 98.3 F (36.8 C), temperature source Oral, resp. rate 18, height 5' 1 (1.549 m), weight 48.3 kg, SpO2 97%. Physical Exam  Results for orders placed or performed during the hospital encounter of 06/14/24 (from the past 48 hours)  Glucose, capillary     Status: None   Collection Time: 06/15/24  3:57 PM  Result Value Ref Range   Glucose-Capillary 94 70 - 99 mg/dL    Comment: Glucose reference range applies only to samples taken after fasting for at least 8 hours.  Glucose, capillary     Status: None   Collection Time: 06/15/24  9:31 PM  Result Value Ref Range   Glucose-Capillary 93 70 - 99 mg/dL    Comment: Glucose reference range applies only to samples taken after fasting for at least 8 hours.  Lactic acid, plasma     Status: None   Collection Time: 06/16/24  5:45 AM  Result Value Ref Range   Lactic Acid, Venous 1.1 0.5 - 1.9 mmol/L    Comment: Performed at [facility], [address] [place], [place] [address]  Renal function panel     Status: Abnormal   Collection Time: 06/16/24  5:45 AM  Result Value Ref Range   Sodium 136 135 - 145 mmol/L   Potassium 3.5 3.5 - 5.1 mmol/L   Chloride 97 (L) 98 - 111 mmol/L   CO2 23 22 - 32 mmol/L   Glucose, Bld 72 70 - 99 mg/dL    Comment: Glucose reference range applies only to samples taken after fasting for at least 8 hours.   BUN 77 (H) 8 - 23 mg/dL   Creatinine, Ser 3.47 (H) 0.44 - 1.00 mg/dL   Calcium  7.8 (L) 8.9 - 10.3 mg/dL   Phosphorus 4.0 2.5 - 4.6 mg/dL   Albumin 1.6 (L) 3.5 - 5.0 g/dL   GFR, Estimated 6 (L) >60 mL/min    Comment: (NOTE) Calculated using the CKD-EPI Creatinine Equation (2021)    Anion gap 16 (H) 5 - 15    Comment: Performed at [facility], [address] [place], [place] [address]  CBC with Differential/Platelet     Status: Abnormal   Collection Time: 06/16/24  5:45 AM  Result Value Ref Range   WBC 18.0 (H) 4.0 - 10.5 K/uL   RBC 3.67 (L) 3.87 - 5.11  MIL/uL   Hemoglobin 9.7 (L) 12.0 - 15.0 g/dL   HCT 73.7 (L) 63.9 - 53.9 %   MCV 71.4 (L) 80.0 - 100.0 fL   MCH 26.4 26.0 - 34.0 pg   MCHC 37.0 (H) 30.0 - 36.0 g/dL   RDW 85.6 88.4 - 84.4 %   Platelets 223 150 - 400 K/uL   nRBC 0.0 0.0 - 0.2 %   Neutrophils Relative % 79 %   Neutro Abs 14.2 (H) 1.7 - 7.7 K/uL  Lymphocytes Relative 13 %   Lymphs Abs 2.4 0.7 - 4.0 K/uL   Monocytes Relative 5 %   Monocytes Absolute 1.0 0.1 - 1.0 K/uL   Eosinophils Relative 1 %   Eosinophils Absolute 0.1 0.0 - 0.5 K/uL   Basophils Relative 0 %   Basophils Absolute 0.0 0.0 - 0.1 K/uL   Immature Granulocytes 2 %   Abs Immature Granulocytes 0.27 (H) 0.00 - 0.07 K/uL    Comment: Performed at [facility], [address] [place], [place] [address]  Glucose, capillary     Status: None   Collection Time: 06/16/24  9:07 AM  Result Value Ref Range   Glucose-Capillary 76 70 - 99 mg/dL    Comment: Glucose reference range applies only to samples taken after fasting for at least 8 hours.  Glucose, capillary     Status: None   Collection Time: 06/16/24 11:50 AM  Result Value Ref Range   Glucose-Capillary 78 70 - 99 mg/dL    Comment: Glucose reference range applies only to samples taken after fasting for at least 8 hours.  Glucose, capillary     Status: Abnormal   Collection Time: 06/16/24  2:55 PM  Result Value Ref Range   Glucose-Capillary 228 (H) 70 - 99 mg/dL    Comment: Glucose reference range applies only to samples taken after fasting for at least 8 hours.  Glucose, capillary     Status: Abnormal   Collection Time: 06/16/24  9:44 PM  Result Value Ref Range   Glucose-Capillary 358 (H) 70 - 99 mg/dL    Comment: Glucose reference range applies only to samples taken after fasting for at least 8 hours.  CBC     Status: Abnormal   Collection Time: 06/17/24  4:26 AM  Result Value Ref Range   WBC 15.1 (H) 4.0 - 10.5 K/uL   RBC 3.38 (L) 3.87 - 5.11 MIL/uL   Hemoglobin 8.9 (L) 12.0 - 15.0 g/dL     Comment: Reticulocyte Hemoglobin testing may be clinically indicated, consider ordering this additional test [id number]    HCT 24.5 (L) 36.0 - 46.0 %   MCV 72.5 (L) 80.0 - 100.0 fL   MCH 26.3 26.0 - 34.0 pg   MCHC 36.3 (H) 30.0 - 36.0 g/dL   RDW 85.6 88.4 - 84.4 %   Platelets 224 150 - 400 K/uL   nRBC 0.0 0.0 - 0.2 %    Comment: Performed at [facility], [address] [place], [place] [address]  Renal function panel     Status: Abnormal   Collection Time: 06/17/24  4:26 AM  Result Value Ref Range   Sodium 134 (L) 135 - 145 mmol/L   Potassium 3.5 3.5 - 5.1 mmol/L   Chloride 92 (L) 98 - 111 mmol/L   CO2 25 22 - 32 mmol/L   Glucose, Bld 304 (H) 70 - 99 mg/dL    Comment: Glucose reference range applies only to samples taken after fasting for at least 8 hours.   BUN 68 (H) 8 - 23 mg/dL   Creatinine, Ser 4.47 (H) 0.44 - 1.00 mg/dL   Calcium  8.2 (L) 8.9 - 10.3 mg/dL   Phosphorus 3.7 2.5 - 4.6 mg/dL   Albumin 1.7 (L) 3.5 - 5.0 g/dL   GFR, Estimated 7 (L) >60 mL/min    Comment: (NOTE) Calculated using the CKD-EPI Creatinine Equation (2021)    Anion gap 17 (H) 5 - 15    Comment: Performed at [facility], [address], [place], [place] [address]  Glucose, capillary     Status: Abnormal   Collection Time: 06/17/24  7:28 AM  Result Value Ref Range   Glucose-Capillary 274 (H) 70 - 99 mg/dL    Comment: Glucose reference range applies only to samples taken after fasting for at least 8 hours.  Glucose, capillary     Status: Abnormal   Collection Time: 06/17/24 11:20 AM  Result Value Ref Range   Glucose-Capillary 319 (H) 70 - 99 mg/dL    Comment: Glucose reference range applies only to samples taken after fasting for at least 8 hours.   ECHOCARDIOGRAM COMPLETE Result Date: 06/15/2024    ECHOCARDIOGRAM REPORT   Patient Name:   Sara [name] Date of Exam: 06/15/2024 Medical Rec #:  [id number]         Height:       61.0 in Accession #:    [id number]        Weight:       91.0  lb Date of Birth:  03-15-1941         BSA:          1.352 m Patient Age:    83 years          BP:           152/59 mmHg Patient Gender: F                 HR:           111 bpm. Exam Location:  Inpatient Procedure: 2D Echo, Color Doppler and Cardiac Doppler (Both Spectral and Color            Flow Doppler were utilized during procedure). Indications:    Stroke  History:        Patient has prior history of Echocardiogram examinations, most                 recent 04/11/2024. Risk Factors:Diabetes.  Sonographer:    Benard Stallion Referring Phys: [id number] VISHAL R PATEL IMPRESSIONS  1. Left ventricular ejection fraction, by estimation, is 65 to 70%. The left ventricle has normal function. The left ventricle has no regional wall motion abnormalities. Left ventricular diastolic parameters are consistent with Grade I diastolic dysfunction (impaired relaxation).  2. Right ventricular systolic function is normal. The right ventricular size is normal. There is mildly elevated pulmonary artery systolic pressure. The estimated right ventricular systolic pressure is 37.1 mmHg.  3. The mitral valve is normal in structure. No evidence of mitral valve regurgitation. No evidence of mitral stenosis.  4. The aortic valve is tricuspid. There is mild calcification of the aortic valve. Aortic valve regurgitation is not visualized. No aortic stenosis is present.  5. The inferior vena cava is normal in size with greater than 50% respiratory variability, suggesting right atrial pressure of 3 mmHg. FINDINGS  Left Ventricle: Left ventricular ejection fraction, by estimation, is 65 to 70%. The left ventricle has normal function. The left ventricle has no regional wall motion abnormalities. The left ventricular internal cavity size was normal in size. There is  no left ventricular hypertrophy. Left ventricular diastolic parameters are consistent with Grade I diastolic dysfunction (impaired relaxation). Right Ventricle: The right ventricular  size is normal. No increase in right ventricular wall thickness. Right ventricular systolic function is normal. There is mildly elevated pulmonary artery systolic pressure. The tricuspid regurgitant velocity is 2.92  m/s, and with an assumed right atrial pressure of 3  mmHg, the estimated right ventricular systolic pressure is 37.1 mmHg. Left Atrium: Left atrial size was normal in size. Right Atrium: Right atrial size was normal in size. Pericardium: There is no evidence of pericardial effusion. Mitral Valve: The mitral valve is normal in structure. Mild mitral annular calcification. No evidence of mitral valve regurgitation. No evidence of mitral valve stenosis. MV peak gradient, 8.9 mmHg. The mean mitral valve gradient is 4.0 mmHg. Tricuspid Valve: The tricuspid valve is normal in structure. Tricuspid valve regurgitation is trivial. Aortic Valve: The aortic valve is tricuspid. There is mild calcification of the aortic valve. Aortic valve regurgitation is not visualized. No aortic stenosis is present. Aortic valve mean gradient measures 5.5 mmHg. Aortic valve peak gradient measures 9.7 mmHg. Aortic valve area, by VTI measures 2.09 cm. Pulmonic Valve: The pulmonic valve was normal in structure. Pulmonic valve regurgitation is not visualized. Aorta: The aortic root is normal in size and structure. Venous: The inferior vena cava is normal in size with greater than 50% respiratory variability, suggesting right atrial pressure of 3 mmHg. IAS/Shunts: No atrial level shunt detected by color flow Doppler.  LEFT VENTRICLE PLAX 2D LVIDd:         3.10 cm   Diastology LVIDs:         1.90 cm   LV e' medial:    8.27 cm/s LV PW:         0.90 cm   LV E/e' medial:  9.9 LV IVS:        0.90 cm   LV e' lateral:   7.07 cm/s LVOT diam:     1.70 cm   LV E/e' lateral: 11.6 LV SV:         48 LV SV Index:   35 LVOT Area:     2.27 cm  RIGHT VENTRICLE RV Basal diam:  3.30 cm RV Mid diam:    2.60 cm RV S prime:     19.00 cm/s TAPSE (M-mode):  2.0 cm LEFT ATRIUM             Index        RIGHT ATRIUM          Index LA diam:        2.50 cm 1.85 cm/m   RA Area:     8.01 cm LA Vol (A2C):   25.2 ml 18.65 ml/m  RA Volume:   14.80 ml 10.95 ml/m LA Vol (A4C):   32.6 ml 24.12 ml/m LA Biplane Vol: 31.4 ml 23.23 ml/m  AORTIC VALVE AV Area (Vmax):    2.05 cm AV Area (Vmean):   1.96 cm AV Area (VTI):     2.09 cm AV Vmax:           156.00 cm/s AV Vmean:          109.000 cm/s AV VTI:            0.229 m AV Peak Grad:      9.7 mmHg AV Mean Grad:      5.5 mmHg LVOT Vmax:         141.00 cm/s LVOT Vmean:        94.000 cm/s LVOT VTI:          0.211 m LVOT/AV VTI ratio: 0.92  AORTA Ao Root diam: 2.40 cm MITRAL VALVE                TRICUSPID VALVE MV Area (PHT): 6.54 cm     TR Peak grad:  34.1 mmHg MV Area VTI:   2.13 cm     TR Vmax:        292.00 cm/s MV Peak grad:  8.9 mmHg MV Mean grad:  4.0 mmHg     SHUNTS MV Vmax:       1.49 m/s     Systemic VTI:  0.21 m MV Vmean:      90.2 cm/s    Systemic Diam: 1.70 cm MV Decel Time: 116 msec MV E velocity: 81.90 cm/s MV A velocity: 162.00 cm/s MV E/A ratio:  0.51 Dalton McleanMD Electronically signed by Ezra Kanner Signature Date/Time: 06/15/2024/8:28:47 PM    Final       Blood pressure 130/65, pulse 100, temperature 98.3 F (36.8 C), temperature source Oral, resp. rate 18, height 5' 1 (1.549 m), weight 48.3 kg, SpO2 97%.  Medical Problem List and Plan: 1. Functional deficits secondary to ***  -patient may *** shower  -ELOS/Goals: *** 2.  Antithrombotics: -DVT/anticoagulation:  Pharmaceutical: Heparin   -antiplatelet therapy: Aspirin   3. Pain Management: Tylenol  as needed 4. Mood/Behavior/Sleep: LCSW to follow for evaluation and support when available.   -antipsychotic agents: Seroquel    - Sleep aid: Melatonin scheduled 5. Neuropsych/cognition: This patient is capable of making decisions on her own behalf. 6. Skin/Wound Care: routine pressure relief measures  7. Fluids/Electrolytes/Nutrition: Monitor  strict I&O's- follow-up chemistries  -Speech consult  -carb modified diet + boost--continue multivitamin, thiamine  8.  Severe sepsis secondary to complicated UTI: Klebsiella bacteremia  - On IV Rocephin ----transitioned to Keflex  500 mg x 7 days today 8/20 9.  AKI-CKD stage IIIb: Renal function gradually improving, avoid nephrotoxic agents  - Foley catheter in place--begin voiding trial with PVR as renal function improves. (Ordered for 8/22)  - On lactated Ringer 's??  Continue 10.  Acute urinary retention: Same as above, continue Flomax  and monitor strict I&O 11.  Acute left pontine infarct versus artifactual finding on MRI: Continue aspirin  per neurology recommendation. 12.  T2DM: 8/18 A1c 6.6   - Monitor CBG ACHS and continue SSI--allow for permissive hyperglycemia 13. HLD: atorvastatin  40 mg daily 14.  HTN: Monitor BP per protocol--Lasix/HCTZ on hold starting low-dose metoprolol  25 mg twice daily for mild sinus tachycardia  - Monitor for orthostatic hypotension   ***  Nahome Bublitz L Markise Haymer, NP 06/17/2024

## 2024-06-17 NOTE — Progress Notes (Signed)
 [name] Progress Note  Subjective:  Patient is having a headache today as well as some nausea and vomiting.  Creatinine continues to improve.  Good urine output.  Vitals:   06/16/24 1030 06/16/24 1151 06/16/24 2052 06/17/24 0730  BP: (!) 155/73 (!) 143/72 (!) 122/59 135/71  Pulse:  (!) 127  98  Resp: 18 20  15   Temp: 98.7 F (37.1 C) 99.2 F (37.3 C) 99.2 F (37.3 C) 98.2 F (36.8 C)  TempSrc: Oral Oral Oral Oral  SpO2:  98%  100%  Weight:      Height:        Exam: Gen alert, no distress, elderly female Chest: Bilateral chest rise with no increased work of breathing Cardiac: Tachycardic, no rub Abd soft ntnd no mass or ascites +bs GU foley placed draining with some cloudiness Ext no LE or UE edema Neuro is alert, Ox 3 , nf     Home bp meds: Hydrochlorothiazide  Others: lipitor, nexium , megace  (not taking), remeron , prns       Date                             Creat               eGFR (ml/min) 2019- 2022                  0.69- 1.12 2023                            0.73- 2.13 2024                            0.85- 1.00        57- 69 ml/min May 2025                    2.24 >> 1.52    21- 34 ml/min June 2025                   1.75- 2.91        15- 29 ml/min 06/14/24                        9.52                 4 ml/min       UA: mod Hb, small LE, prot 100, >50 rbc/ >50 wbc/ 0-5 epi UNa, UCr: pend Renal US : 10.9/ 9/9 cm kidneys w/o hydro, ^echotexture   Assessment/ Plan: AKI on CKD 3b: b/l creatinine 1.5- 1.8 from may.  AKI up to 9.5 with urinary retention and likely UTI as well as dehydration and possible tubular injury.  Creatinine steadily improving with Foley catheter, antibiotics, hydration.  Given the patient's steady improvement we will sign off UTI/sepsis/Klebsiella bacteremia: Antibiotics per primary team Urinary retention: Foley in place.  Management per primary team Metabolic acidosis: Resolved. Anemia: Multifactorial.  Continue management  per primary team         Sara [name]  06/17/2024, 10:10 AM  Recent Labs  Lab 06/16/24 0545 06/17/24 0426  HGB 9.7* 8.9*  ALBUMIN 1.6* 1.7*  CALCIUM  7.8* 8.2*  PHOS 4.0 3.7  CREATININE 6.52* 5.52*  K 3.5 3.5   No results for input(s): IRON, TIBC, FERRITIN in the last 168 hours. Inpatient medications:   stroke:  early stages of recovery book   Does not apply Once   aspirin  EC  81 mg Oral Daily   atorvastatin   40 mg Oral QHS   Chlorhexidine  Gluconate Cloth  6 each Topical Daily   heparin   5,000 Units Subcutaneous Q8H   insulin  aspart  0-6 Units Subcutaneous TID WC   lactose free nutrition  237 mL Oral TID WC   melatonin  5 mg Oral QHS   multivitamin with minerals  1 tablet Oral Daily   tamsulosin   0.4 mg Oral Daily   thiamine   100 mg Oral Daily    cefTRIAXone  (ROCEPHIN )  IV 2 g (06/16/24 1822)   lactated ringers  Stopped (06/17/24 0922)   acetaminophen  **OR** acetaminophen  (TYLENOL ) oral liquid 160 mg/5 mL **OR** acetaminophen , ondansetron  (ZOFRAN ) IV, QUEtiapine , senna-docusate

## 2024-06-17 NOTE — Progress Notes (Signed)
 PMR Admission Coordinator Pre-Admission Assessment   Patient: Sara [name] is an 83 y.o., female MRN: [id number] DOB: [birth year] Height: 5' 1 (154.9 cm) Weight: 48.3 kg                                                                                                                                                  Insurance Information HMO: Yes    PPO:      PCP:      IPA:      80/20:      OTHER: Group [id number] PRIMARY: UHC Medicare      Policy#: [id number]      Subscriber: patient CM Name: Healther      Phone#:       Fax#: [phone] Pre-Cert#: [id number] received approval on 06/17/24 from [place] with update due on 06/23/24 and fax update to [phone]  Employer: Retired Benefits:  Phone #: [phone]     Name: Verified online at [email or web address] Eff. Date: 12/28/23     Deduct: $0      Out of Pocket Max: $3900 (Met [phone])      Life Max: n/a  CIR: $395/day for days 1-6      SNF: $0 days 1-20; $203 for days 21-100 Outpatient:      Co-Pay: $15/visit Home Health: 100%      Co-Pay: none DME: 80%     Co-Pay: 20% Providers: in network  SECONDARY:       Policy#:       Phone#:    Artist:       Phone#:    The Data processing manager" for patients in Inpatient Rehabilitation Facilities with attached "Privacy Act Statement-Health Care Records" was provided and verbally reviewed with: Patient and Family   Emergency Contact Information Contact Information       Name Relation Home Work [place] Son     [phone]    [place] Daughter     [phone]         Other Contacts       Name Relation Home Work Mobile    [place],ROBIN Daughter [phone]             Current Medical History  Patient Admitting Diagnosis: L pontine infarct, sepsis   History of Present Illness: An 83 y.o. female with history of CKD stage IIIb, chronic dysphagia due to hypoglossal nerve palsy, type 2 diabetes, hypertension who presented on 06/15/2024 with a  history since Friday of balance difficulties.  Patient was found to have severe sepsis due to Klebsiella UTI.  Additionally MRI of the brain ultimately was performed and demonstrated a punctate acute left pontine infarct as well as small vessel disease.  CT angio was not performed given her renal status.  Patient also with acute kidney injury superimposed upon her chronic disease.  Renal ultrasound  without any acute abnormalities.  Foley catheter was placed on admission.  Transthoracic echoc with normal ejection fraction and grade 1 diastolic dysfunction.  Patient remains on Rocephin  for her urosepsis.  She received bicarb infusion for her renal abnormalities.  Patient was up with therapy yesterday and transferred sit to stand with min assist and walked 50 feet min assist to contact-guard assistance.  Patient lives at home with her children in a 1 level house with one-step to enter.  Patient was modified independent using a rollator at home.  She also performed basic ADLs and was cooking independently prior to this admission.    Complete NIHSS TOTAL: 1 Glasgow Coma Scale Score: 14   Patient's medical record from [facility] has been reviewed by the rehabilitation admission coordinator and physician.   Past Medical History      Past Medical History:  Diagnosis Date   Allergy     Dizziness 02/21/2018   Estrogen deficiency 01/13/2018   Need for prophylactic vaccination against Streptococcus pneumoniae (pneumococcus) 01/13/2018   Normocytic anemia 04/11/2024   Right knee pain 05/29/2019   Routine general medical examination at a health care facility 01/13/2018   Seasonal allergies 06/21/2020   Vaginal itching 06/21/2020   Vision changes 01/13/2018          Has the patient had major surgery during 100 days prior to admission? No   Family History  family history includes Alzheimer's disease in her brother and father; Cancer in her brother and sister; Diabetes in her mother; Heart disease in her  mother; Other in her mother.     Current Medications   Current Medications    Current Facility-Administered Medications:     stroke: early stages of recovery book, , Does not apply, Once, Tobie Jorie SAUNDERS, MD   acetaminophen  (TYLENOL ) tablet 650 mg, 650 mg, Oral, Q4H PRN, 650 mg at 06/16/24 1515 **OR** acetaminophen  (TYLENOL ) 160 MG/5ML solution 650 mg, 650 mg, Per Tube, Q4H PRN **OR** acetaminophen  (TYLENOL ) suppository 650 mg, 650 mg, Rectal, Q4H PRN, Tobie, Vishal R, MD   aspirin  EC tablet 81 mg, 81 mg, Oral, Daily, Tobie, Vishal R, MD, 81 mg at 06/17/24 9083   atorvastatin  (LIPITOR) tablet 40 mg, 40 mg, Oral, QHS, Patel, Vishal R, MD, 40 mg at 06/16/24 2042   cephALEXin  (KEFLEX ) capsule 500 mg, 500 mg, Oral, Q12H, Ghimire, Donalda HERO, MD   Chlorhexidine  Gluconate Cloth 2 % PADS 6 each, 6 each, Topical, Daily, Ghimire, Donalda HERO, MD, 6 each at 06/17/24 0917   heparin  injection 5,000 Units, 5,000 Units, Subcutaneous, Q8H, Patel, Vishal R, MD, 5,000 Units at 06/17/24 0553   insulin  aspart (novoLOG ) injection 0-9 Units, 0-9 Units, Subcutaneous, TID WC, Ghimire, Donalda HERO, MD   lactated ringers  infusion, , Intravenous, Continuous, Ghimire, Donalda HERO, MD   lactose free nutrition (BOOST PLUS) liquid 237 mL, 237 mL, Oral, TID WC, Ghimire, Shanker M, MD, 237 mL at 06/17/24 0917   melatonin tablet 5 mg, 5 mg, Oral, QHS, Ghimire, Shanker M, MD, 5 mg at 06/16/24 2044   multivitamin with minerals tablet 1 tablet, 1 tablet, Oral, Daily, Ghimire, Shanker M, MD, 1 tablet at 06/17/24 9083   ondansetron  (ZOFRAN ) injection 4 mg, 4 mg, Intravenous, Q6H PRN, Patel, Vishal R, MD, 4 mg at 06/16/24 1425   QUEtiapine  (SEROQUEL ) tablet 25 mg, 25 mg, Oral, QHS PRN, Ghimire, Donalda HERO, MD   senna-docusate (Senokot-S) tablet 1 tablet, 1 tablet, Oral, QHS PRN, Tobie Jorie SAUNDERS, MD   tamsulosin  (FLOMAX )  capsule 0.4 mg, 0.4 mg, Oral, Daily, Ghimire, Shanker M, MD, 0.4 mg at 06/17/24 9083   thiamine  (VITAMIN B1) tablet 100  mg, 100 mg, Oral, Daily, Ghimire, Donalda HERO, MD, 100 mg at 06/17/24 9083     Patients Current Diet:  Diet Order                  Diet heart healthy/carb modified Fluid consistency: Thin  Diet effective now             Diet - low sodium heart healthy             Diet Carb Modified                         Precautions / Restrictions Precautions Precautions: Fall Restrictions Weight Bearing Restrictions Per Provider Order: No    Has the patient had 2 or more falls or a fall with injury in the past year?No   Prior Activity Level Limited Community (1-2x/wk): Went to church and to the store about 2X a week   Prior Functional Level Prior Function Prior Level of Function : Independent/Modified Independent Mobility Comments: Ambulatory with rollator, denies falls. ADLs Comments: Bathes & dresses without assistance, cooks (cooked ribs for family last weekend).   Self Care: Did the patient need help bathing, dressing, using the toilet or eating?  Independent   Indoor Mobility: Did the patient need assistance with walking from room to room (with or without device)? Independent   Stairs: Did the patient need assistance with internal or external stairs (with or without device)? Independent   Functional Cognition: Did the patient need help planning regular tasks such as shopping or remembering to take medications? Independent   Patient Information Are you of Hispanic, Latino/a,or Spanish origin?: A. No, not of Hispanic, Latino/a, or Spanish origin What is your race?: B. Black or African American Do you need or want an interpreter to communicate with a doctor or health care staff?: 0. No Patient information obtained via proxy : Yes   Patient's Response To:  Health Literacy and Transportation Is the patient able to respond to health literacy and transportation needs?: No Health Literacy - How often do you need to have someone help you when you read instructions, pamphlets, or  other written material from your doctor or pharmacy?: Patient unable to respond In the past 12 months, has lack of transportation kept you from medical appointments or from getting medications?: No In the past 12 months, has lack of transportation kept you from meetings, work, or from getting things needed for daily living?: No Higher education careers adviser obtained via proxy: Yes   Journalist, newspaper / Equipment Home Equipment: Rollator (4 wheels)   Prior Device Use: Indicate devices/aids used by the patient prior to current illness, exacerbation or injury? Walker   Current Functional Level Cognition   Orientation Level: Disoriented to situation    Extremity Assessment (includes Sensation/Coordination)   Upper Extremity Assessment: Generalized weakness  Lower Extremity Assessment: Defer to PT evaluation     ADLs   Overall ADL's : Needs assistance/impaired Eating/Feeding: Set up, Sitting Grooming: Minimal assistance, Standing, Wash/dry hands Grooming Details (indicate cue type and reason): for problem solving, attention to task, balance. Upper Body Bathing: Set up, Sitting Lower Body Bathing: Minimal assistance, Sit to/from stand Upper Body Dressing : Set up, Sitting Lower Body Dressing: Minimal assistance, Sit to/from stand Toilet Transfer: Minimal assistance, Cueing for safety, Cueing for sequencing, Rolling walker (  2 wheels), Ambulation, Regular Toilet, Grab bars Toileting- Clothing Manipulation and Hygiene: Minimal assistance, Sit to/from stand Functional mobility during ADLs: Rolling walker (2 wheels), Minimal assistance, Cueing for sequencing, Cueing for safety     Mobility   Overal bed mobility: Needs Assistance Bed Mobility: Sit to Supine Supine to sit: Min assist, Contact guard, Used rails, HOB elevated (exit L side of bed) Sit to supine: Min assist General bed mobility comments: OOB in chair     Transfers   Overall transfer level: Needs assistance Equipment  used: Rolling walker (2 wheels) Transfers: Sit to/from Stand Sit to Stand: Min assist General transfer comment: Assist to power up, cues for hand placement     Ambulation / Gait / Stairs / Wheelchair Mobility   Ambulation/Gait Ambulation/Gait assistance: Editor, commissioning (Feet): 100 Feet Assistive device: Rollator (4 wheels) Gait Pattern/deviations: Step-through pattern, Decreased stride length, Drifts right/left General Gait Details: Up to minA for dynamic balance due to drift, cues for environmental navigation and maneuvering around obstacles Gait velocity: decreased Gait velocity interpretation: <1.8 ft/sec, indicate of risk for recurrent falls     Posture / Balance Dynamic Sitting Balance Sitting balance - Comments: supervision static sitting EOB Balance Overall balance assessment: Needs assistance Sitting-balance support: Feet supported Sitting balance-Leahy Scale: Fair Sitting balance - Comments: supervision static sitting EOB Standing balance support: Bilateral upper extremity supported, Reliant on assistive device for balance, During functional activity Standing balance-Leahy Scale: Fair     Special considerations/ Life events None        Previous Home Environment (from acute therapy documentation) Living Arrangements: Children Available Help at Discharge: Family Type of Home: House Home Layout: One level Home Access: Stairs to enter Secretary/administrator of Steps: 1 Bathroom Shower/Tub: Associate Professor: Yes How Accessible: Accessible via walker Home Care Services: No   Discharge Living Setting Plans for Discharge Living Setting: Patient's home, House, Lives with (comment) (Lives with daughter) Type of Home at Discharge: House Discharge Home Layout: One level Discharge Home Access: Stairs to enter Entrance Stairs-Rails: Right, Left, Can reach both Entrance Stairs-Number of Steps: 2 Discharge Bathroom  Shower/Tub: Tub/shower unit, Curtain Discharge Bathroom Toilet: Standard Discharge Bathroom Accessibility: Yes How Accessible: Accessible via walker Does the patient have any problems obtaining your medications?: No   Social/Family/Support Systems Patient Roles: Parent (Has a daughter and a son.) Contact Information: [name] Sang[place] - son - [phone] Anticipated Caregiver: Lorrayne Ewing - daughter Anticipated Caregiver's Contact Information: Lorrayne - daughter - [phone] Ability/Limitations of Caregiver: Dtr works days Caregiver Availability: Intermittent Discharge Plan Discussed with Primary Caregiver: Yes Is Caregiver In Agreement with Plan?: Yes Does Caregiver/Family have Issues with Lodging/Transportation while Pt is in Rehab?: No     Goals Patient/Family Goal for Rehab: PT/OT/SLP mod I and supervision goals Expected length of stay: 7 days Pt/Family Agrees to Admission and willing to participate: Yes Program Orientation Provided & Reviewed with Pt/Caregiver Including Roles  & Responsibilities: Yes     Decrease burden of Care through IP rehab admission: N/A     Possible need for SNF placement upon discharge: Not planned     Patient Condition: This patient's condition remains as documented in the consult dated 06/16/24, in which the Rehabilitation Physician determined and documented that the patient's condition is appropriate for intensive rehabilitative care in an inpatient rehabilitation facility. Will admit to inpatient rehab today.   Preadmission Screen Completed By:  Lovett CHRISTELLA Ropes, RN, 06/17/2024 12:54 PM ______________________________________________________________________   Discussed status  with Dr. Lorilee on8/20/25 at 0915 and received approval for admission today.   Admission Coordinator:  Lovett CHRISTELLA Ropes, time1249/Date8/20/25           Cosigned by: Lorilee Sven SQUIBB, MD at 06/17/2024 12:56 PM   Revision History

## 2024-06-17 NOTE — Plan of Care (Signed)

## 2024-06-17 NOTE — Progress Notes (Addendum)
 Patient's HR was elevated on admission, provider Brandi Leak was notified. Patient did receive metoprolol  prior to arriving. Nurse & charge nurse was made aware.

## 2024-06-17 NOTE — TOC Transition Note (Signed)
 Transition of Care [facility]) - Discharge Note   Patient Details  Name: Sara [name] MRN: [id number] Date of Birth: 01/16/1941  [facility] CM/SW Contact:  Inocente GORMAN Kindle, LCSW Phone Number: 06/17/2024, 12:06 PM   Clinical Narrative:    Patient discharging to CIR today. No further ICM needs identified.    Final next level of care: IP Rehab Facility Barriers to Discharge: No Barriers Identified   Patient Goals and CMS Choice Patient states their goals for this hospitalization and ongoing recovery are:: Rehab   Choice offered to / list presented to : Adult Children [place] ownership interest in [facility].provided to:: Adult Children    Discharge Placement                    Patient and family notified of of transfer: 06/17/24  Discharge Plan and Services Additional resources added to the After Visit Summary for   In-house Referral: Clinical Social Work                                   Social Drivers of Health (SDOH) Interventions SDOH Screenings   Food Insecurity: No Food Insecurity (06/15/2024)  Housing: Low Risk  (06/15/2024)  Transportation Needs: No Transportation Needs (06/15/2024)  Utilities: Not At Risk (06/15/2024)  Alcohol  Screen: Low Risk  (07/05/2021)  Depression (PHQ2-9): Low Risk  (04/12/2023)  Financial Resource Strain: Low Risk  (07/05/2021)  Physical Activity: Insufficiently Active (07/05/2021)  Social Connections: Moderately Integrated (06/15/2024)  Stress: No Stress Concern Present (07/05/2021)  Tobacco Use: Low Risk  (06/14/2024)     Readmission Risk Interventions    06/16/2024    3:19 PM 03/28/2024    1:40 PM 09/03/2022    2:19 PM  Readmission Risk Prevention Plan  Post Dischage Appt  Complete Complete  Medication Screening  Complete Complete  Transportation Screening Complete Complete Complete  Medication Review (RN Futures trader) Complete    PCP or Specialist appointment within 3-5 days of discharge Complete     HRI or Home Care Consult Complete    SW Recovery Care/Counseling Consult Complete    Palliative Care Screening Not Applicable    Skilled Nursing Facility Complete

## 2024-06-17 NOTE — Progress Notes (Addendum)
   06/17/24 1000  Mobility  Activity Stood at bedside  Level of Assistance Minimal assist, patient does 75% or more  Assistive Device Front wheel walker  Activity Response Tolerated fair  Mobility Referral Yes  Mobility visit 1 Mobility  Mobility Specialist Start Time (ACUTE ONLY) 0945  Mobility Specialist Stop Time (ACUTE ONLY) 1000  Mobility Specialist Time Calculation (min) (ACUTE ONLY) 15 min   Mobility Specialist: Progress Note  Pre-Mobility:      HR 108, SpO2 100% RA During Mobility: HR 126,                             BP 145/70 (93) Post-Mobility:    HR 123, SpO2 100 % RA  Pt agreeable to mobility session - received in bed . C/o dizziness with EOB, nausea upon STS. Returned to bed with all needs met - call bell within reach. Bed alarm on. Daughter present. Further mobility deferred d/t high HR and sx.   Virgle Boards, BS Mobility Specialist Please contact via SecureChat or  Rehab office at [phone].

## 2024-06-17 NOTE — Progress Notes (Signed)
 Physical Medicine and Rehabilitation Consult Reason for Consult: Impaired balance and functional mobility after stroke Referring Physician: Raenelle     HPI: Sara [name] is a 83 y.o. female with history of CKD stage IIIb, chronic dysphagia due to hypoglossal nerve palsy, type 2 diabetes, hypertension who presented on 06/15/2024 with a history since Friday of balance difficulties.  Patient was found to have severe sepsis due to Klebsiella UTI.  Additionally MRI of the brain ultimately was performed and demonstrated a punctate acute left pontine infarct as well as small vessel disease.  CT angio was not performed given her renal status.  Patient also with acute kidney injury superimposed upon her chronic disease.  Renal ultrasound without any acute abnormalities.  Foley catheter was placed on admission.  Transthoracic echoc with normal ejection fraction and grade 1 diastolic dysfunction.  Patient remains on Rocephin  for her urosepsis.  She received bicarb infusion for her renal abnormalities.  Patient was up with therapy yesterday and transferred sit to stand with min assist and walked 50 feet min assist to contact-guard assistance.  Patient lives at home with her children in a 1 level house with one-step to enter.  Patient was modified independent using a rollator at home.  She also performed basic ADLs and was cooking independently prior to this admission.     Home: Home Living Family/patient expects to be discharged to:: Private residence Living Arrangements: Children Available Help at Discharge: Family Type of Home: House Home Access: Stairs to enter Secretary/administrator of Steps: 1 Home Layout: One level Bathroom Shower/Tub: Engineer, manufacturing systems: Standard Bathroom Accessibility: Yes Home Equipment: Rollator (4 wheels)  Functional History: Prior Function Prior Level of Function : Independent/Modified Independent Mobility Comments: Ambulatory with rollator, denies  falls. ADLs Comments: Bathes & dresses without assistance, cooks (cooked ribs for family last weekend). Functional Status:  Mobility: Bed Mobility Overal bed mobility: Needs Assistance Bed Mobility: Supine to Sit Supine to sit: Min assist, Contact guard, Used rails, HOB elevated (exit L side of bed) General bed mobility comments: OOB in chair Transfers Overall transfer level: Needs assistance Equipment used: Rolling walker (2 wheels) Transfers: Sit to/from Stand Sit to Stand: Min assist General transfer comment: cuing re: hand placement with RW, assistance to power up from low surface Ambulation/Gait Ambulation/Gait assistance: Contact guard assist, Min assist Gait Distance (Feet): 50 Feet Assistive device: Rolling walker (2 wheels) Gait Pattern/deviations: Decreased step length - right, Decreased stride length, Decreased step length - left General Gait Details: Cuing to ambulate within base of AD as pt standing slightly to R of walker, extra time to maneuver RW around obstacles. Gait velocity: decreased   ADL: ADL Overall ADL's : Needs assistance/impaired Eating/Feeding: Set up, Sitting Grooming: Minimal assistance, Standing, Wash/dry face Upper Body Bathing: Set up, Sitting Lower Body Bathing: Minimal assistance, Sit to/from stand Upper Body Dressing : Set up, Sitting Lower Body Dressing: Minimal assistance, Sit to/from stand Toilet Transfer: Minimal assistance, Cueing for safety, Ambulation, Rolling walker (2 wheels), Regular Toilet, Grab bars Toileting- Clothing Manipulation and Hygiene: Minimal assistance, Sit to/from stand Functional mobility during ADLs: Rolling walker (2 wheels), Minimal assistance   Cognition: Cognition Orientation Level: Oriented X4 Cognition Arousal: Alert Behavior During Therapy: WFL for tasks assessed/performed     Review of Systems  Unable to perform ROS: Mental acuity       Past Medical History:  Diagnosis Date   Allergy     Dizziness  02/21/2018   Estrogen deficiency 01/13/2018   Need for  prophylactic vaccination against Streptococcus pneumoniae (pneumococcus) 01/13/2018   Normocytic anemia 04/11/2024   Right knee pain 05/29/2019   Routine general medical examination at a health care facility 01/13/2018   Seasonal allergies 06/21/2020   Vaginal itching 06/21/2020   Vision changes 01/13/2018             Past Surgical History:  Procedure Laterality Date   DG THUMB RIGHT HAND ([facility] HX) Right     EYE SURGERY       IR GASTROSTOMY TUBE MOD SED   09/05/2022   IR GASTROSTOMY TUBE REMOVAL   12/04/2022   TUBAL LIGATION                 Family History  Problem Relation Age of Onset   Heart disease Mother     Diabetes Mother     Other Mother          Visual merchandiser   Alzheimer's disease Father     Cancer Sister     Cancer Brother     Alzheimer's disease Brother     Breast cancer Neg Hx          Social History:  reports that she has never smoked. She has never used smokeless tobacco. She reports that she does not drink alcohol  and does not use drugs. Allergies:  Allergies  No Known Allergies         Medications Prior to Admission  Medication Sig Dispense Refill   cetirizine  (ZYRTEC ) 10 MG tablet Take 1 tablet (10 mg total) by mouth daily as needed for allergies. 30 tablet 11   cholecalciferol (VITAMIN D3) 25 MCG (1000 UNIT) tablet Take 1,000 Units by mouth daily.       folic acid  (FOLVITE ) 1 MG tablet Take 1 tablet (1 mg total) by mouth daily. (Patient taking differently: Take 1 mg by mouth at bedtime.) 90 tablet 0   furosemide (LASIX) 20 MG tablet Take 20 mg by mouth every morning.       hydrochlorothiazide  (HYDRODIURIL ) 25 MG tablet Take 25 mg by mouth daily. 90 day supply June 2025       hydroxypropyl methylcellulose / hypromellose (ISOPTO TEARS / GONIOVISC) 2.5 % ophthalmic solution Place 1 drop into both eyes 4 (four) times daily as needed for dry eyes.       mirtazapine  (REMERON ) 15 MG tablet Take 15 mg by mouth at  bedtime. 90 day supply 03/2024       potassium chloride  (KLOR-CON ) 10 MEQ tablet Take 10 mEq by mouth every morning.       Accu-Chek Softclix Lancets lancets Please use to check blood sugar up to 4 times daily. 400 each 1   atorvastatin  (LIPITOR) 40 MG tablet Place 1 tablet (40 mg total) into feeding tube daily. (Patient taking differently: Take 40 mg by mouth at bedtime.) 90 tablet 0   blood glucose meter kit and supplies KIT Dispense based on patient and insurance preference. Use up to four times daily as directed. 1 each 0   esomeprazole  (NEXIUM ) 10 MG packet Take 10 mg by mouth daily before breakfast. (Patient not taking: Reported on 06/14/2024) 30 each 12   glucose blood (ACCU-CHEK GUIDE) test strip USE 1  4 TIMES DAILY 400 each 0   Insulin  Pen Needle (PEN NEEDLES 3/16) 31G X 5 MM MISC Use as instructed to inject insulin  once daily 90 each 3            Blood pressure (!) 143/72, pulse (!) 127, temperature  99.2 F (37.3 C), temperature source Oral, resp. rate 20, height 5' 1 (1.549 m), weight 48.3 kg, SpO2 98%. Physical Exam Constitutional:      General: She is not in acute distress. HENT:     Head: Normocephalic and atraumatic.     Nose: Nose normal.     Mouth/Throat:     Mouth: Mucous membranes are moist.  Eyes:     Pupils: Pupils are equal, round, and reactive to light.  Cardiovascular:     Rate and Rhythm: Tachycardia present.  Pulmonary:     Effort: Pulmonary effort is normal.  Abdominal:     Palpations: Abdomen is soft.  Musculoskeletal:        General: No swelling or tenderness.     Cervical back: Normal range of motion.  Skin:    General: Skin is warm.  Neurological:     Mental Status: She is alert.     Comments: Pt is alert, oriented to self, the 8th month and the 25th year. Knew she was at [facility]. Struggled with biographical information. Slow to process information, perform simple tasks. Right central VII. MMT: RUE 4/5, RLE 3+ to 4-/5 with processing  delays, motor apraxia. LUE and LLE grossly 4 to 4+/5. Decreased LT right leg and foot although somewhat inconsistent. No abnl resting tone. DTR's 1+  Psychiatric:     Comments: Pt pleasant but distracted.       Lab Results Last 24 Hours       Results for orders placed or performed during the hospital encounter of 06/14/24 (from the past 24 hours)  Glucose, capillary     Status: None    Collection Time: 06/15/24  3:57 PM  Result Value Ref Range    Glucose-Capillary 94 70 - 99 mg/dL  Glucose, capillary     Status: None    Collection Time: 06/15/24  9:31 PM  Result Value Ref Range    Glucose-Capillary 93 70 - 99 mg/dL  Lactic acid, plasma     Status: None    Collection Time: 06/16/24  5:45 AM  Result Value Ref Range    Lactic Acid, Venous 1.1 0.5 - 1.9 mmol/L  Renal function panel     Status: Abnormal    Collection Time: 06/16/24  5:45 AM  Result Value Ref Range    Sodium 136 135 - 145 mmol/L    Potassium 3.5 3.5 - 5.1 mmol/L    Chloride 97 (L) 98 - 111 mmol/L    CO2 23 22 - 32 mmol/L    Glucose, Bld 72 70 - 99 mg/dL    BUN 77 (H) 8 - 23 mg/dL    Creatinine, Ser 3.47 (H) 0.44 - 1.00 mg/dL    Calcium  7.8 (L) 8.9 - 10.3 mg/dL    Phosphorus 4.0 2.5 - 4.6 mg/dL    Albumin 1.6 (L) 3.5 - 5.0 g/dL    GFR, Estimated 6 (L) >60 mL/min    Anion gap 16 (H) 5 - 15  CBC with Differential/Platelet     Status: Abnormal    Collection Time: 06/16/24  5:45 AM  Result Value Ref Range    WBC 18.0 (H) 4.0 - 10.5 K/uL    RBC 3.67 (L) 3.87 - 5.11 MIL/uL    Hemoglobin 9.7 (L) 12.0 - 15.0 g/dL    HCT 73.7 (L) 63.9 - 46.0 %    MCV 71.4 (L) 80.0 - 100.0 fL    MCH 26.4 26.0 - 34.0 pg    MCHC  37.0 (H) 30.0 - 36.0 g/dL    RDW 85.6 88.4 - 84.4 %    Platelets 223 150 - 400 K/uL    nRBC 0.0 0.0 - 0.2 %    Neutrophils Relative % 79 %    Neutro Abs 14.2 (H) 1.7 - 7.7 K/uL    Lymphocytes Relative 13 %    Lymphs Abs 2.4 0.7 - 4.0 K/uL    Monocytes Relative 5 %    Monocytes Absolute 1.0 0.1 - 1.0 K/uL     Eosinophils Relative 1 %    Eosinophils Absolute 0.1 0.0 - 0.5 K/uL    Basophils Relative 0 %    Basophils Absolute 0.0 0.0 - 0.1 K/uL    Immature Granulocytes 2 %    Abs Immature Granulocytes 0.27 (H) 0.00 - 0.07 K/uL  Glucose, capillary     Status: None    Collection Time: 06/16/24  9:07 AM  Result Value Ref Range    Glucose-Capillary 76 70 - 99 mg/dL  Glucose, capillary     Status: None    Collection Time: 06/16/24 11:50 AM  Result Value Ref Range    Glucose-Capillary 78 70 - 99 mg/dL       Imaging Results (Last 48 hours)  ECHOCARDIOGRAM COMPLETE Result Date: 06/15/2024    ECHOCARDIOGRAM REPORT   Patient Name:   DARIEN MIGNOGNA Date of Exam: 06/15/2024 Medical Rec #:  [id number]         Height:       61.0 in Accession #:    [id number]        Weight:       91.0 lb Date of Birth:  1941-07-22         BSA:          1.352 m Patient Age:    83 years          BP:           152/59 mmHg Patient Gender: F                 HR:           111 bpm. Exam Location:  Inpatient Procedure: 2D Echo, Color Doppler and Cardiac Doppler (Both Spectral and Color            Flow Doppler were utilized during procedure). Indications:    Stroke  History:        Patient has prior history of Echocardiogram examinations, most                 recent 04/11/2024. Risk Factors:Diabetes.  Sonographer:    Benard Stallion Referring Phys: [id number] VISHAL R PATEL IMPRESSIONS  1. Left ventricular ejection fraction, by estimation, is 65 to 70%. The left ventricle has normal function. The left ventricle has no regional wall motion abnormalities. Left ventricular diastolic parameters are consistent with Grade I diastolic dysfunction (impaired relaxation).  2. Right ventricular systolic function is normal. The right ventricular size is normal. There is mildly elevated pulmonary artery systolic pressure. The estimated right ventricular systolic pressure is 37.1 mmHg.  3. The mitral valve is normal in structure. No evidence of mitral valve  regurgitation. No evidence of mitral stenosis.  4. The aortic valve is tricuspid. There is mild calcification of the aortic valve. Aortic valve regurgitation is not visualized. No aortic stenosis is present.  5. The inferior vena cava is normal in size with greater than 50% respiratory variability, suggesting right atrial pressure of 3 mmHg. FINDINGS  Left Ventricle: Left ventricular ejection fraction, by estimation, is 65 to 70%. The left ventricle has normal function. The left ventricle has no regional wall motion abnormalities. The left ventricular internal cavity size was normal in size. There is  no left ventricular hypertrophy. Left ventricular diastolic parameters are consistent with Grade I diastolic dysfunction (impaired relaxation). Right Ventricle: The right ventricular size is normal. No increase in right ventricular wall thickness. Right ventricular systolic function is normal. There is mildly elevated pulmonary artery systolic pressure. The tricuspid regurgitant velocity is 2.92  m/s, and with an assumed right atrial pressure of 3 mmHg, the estimated right ventricular systolic pressure is 37.1 mmHg. Left Atrium: Left atrial size was normal in size. Right Atrium: Right atrial size was normal in size. Pericardium: There is no evidence of pericardial effusion. Mitral Valve: The mitral valve is normal in structure. Mild mitral annular calcification. No evidence of mitral valve regurgitation. No evidence of mitral valve stenosis. MV peak gradient, 8.9 mmHg. The mean mitral valve gradient is 4.0 mmHg. Tricuspid Valve: The tricuspid valve is normal in structure. Tricuspid valve regurgitation is trivial. Aortic Valve: The aortic valve is tricuspid. There is mild calcification of the aortic valve. Aortic valve regurgitation is not visualized. No aortic stenosis is present. Aortic valve mean gradient measures 5.5 mmHg. Aortic valve peak gradient measures 9.7 mmHg. Aortic valve area, by VTI measures 2.09 cm.  Pulmonic Valve: The pulmonic valve was normal in structure. Pulmonic valve regurgitation is not visualized. Aorta: The aortic root is normal in size and structure. Venous: The inferior vena cava is normal in size with greater than 50% respiratory variability, suggesting right atrial pressure of 3 mmHg. IAS/Shunts: No atrial level shunt detected by color flow Doppler.  LEFT VENTRICLE PLAX 2D LVIDd:         3.10 cm   Diastology LVIDs:         1.90 cm   LV e' medial:    8.27 cm/s LV PW:         0.90 cm   LV E/e' medial:  9.9 LV IVS:        0.90 cm   LV e' lateral:   7.07 cm/s LVOT diam:     1.70 cm   LV E/e' lateral: 11.6 LV SV:         48 LV SV Index:   35 LVOT Area:     2.27 cm  RIGHT VENTRICLE RV Basal diam:  3.30 cm RV Mid diam:    2.60 cm RV S prime:     19.00 cm/s TAPSE (M-mode): 2.0 cm LEFT ATRIUM             Index        RIGHT ATRIUM          Index LA diam:        2.50 cm 1.85 cm/m   RA Area:     8.01 cm LA Vol (A2C):   25.2 ml 18.65 ml/m  RA Volume:   14.80 ml 10.95 ml/m LA Vol (A4C):   32.6 ml 24.12 ml/m LA Biplane Vol: 31.4 ml 23.23 ml/m  AORTIC VALVE AV Area (Vmax):    2.05 cm AV Area (Vmean):   1.96 cm AV Area (VTI):     2.09 cm AV Vmax:           156.00 cm/s AV Vmean:          109.000 cm/s AV VTI:  0.229 m AV Peak Grad:      9.7 mmHg AV Mean Grad:      5.5 mmHg LVOT Vmax:         141.00 cm/s LVOT Vmean:        94.000 cm/s LVOT VTI:          0.211 m LVOT/AV VTI ratio: 0.92  AORTA Ao Root diam: 2.40 cm MITRAL VALVE                TRICUSPID VALVE MV Area (PHT): 6.54 cm     TR Peak grad:   34.1 mmHg MV Area VTI:   2.13 cm     TR Vmax:        292.00 cm/s MV Peak grad:  8.9 mmHg MV Mean grad:  4.0 mmHg     SHUNTS MV Vmax:       1.49 m/s     Systemic VTI:  0.21 m MV Vmean:      90.2 cm/s    Systemic Diam: 1.70 cm MV Decel Time: 116 msec MV E velocity: 81.90 cm/s MV A velocity: 162.00 cm/s MV E/A ratio:  0.51 Dalton McleanMD Electronically signed by Ezra Kanner Signature Date/Time:  06/15/2024/8:28:47 PM    Final     US  RENAL Result Date: 06/14/2024 CLINICAL DATA:  Acute renal failure superimposed on stage 3 chronic kidney disease. EXAM: RENAL / URINARY TRACT ULTRASOUND COMPLETE COMPARISON:  Ultrasound 04/24/2024 FINDINGS: Right Kidney: Renal measurements: 10.9 x 5.7 x 5.5 cm = volume: 179 mL. Increased cortical echogenicity. 1.1 x 1.0 x 0.8 cm cyst. No follow-up recommended. No hydronephrosis. Left Kidney: Renal measurements: 9.9 x 5.2 x 5.6 cm = volume: 152 mL. Increased cortical echogenicity. No mass or hydronephrosis. Bladder: Foley catheter in the decompressed bladder. Other: None. IMPRESSION: Increased cortical echogenicity bilaterally compatible with medical renal disease. No hydronephrosis. Electronically Signed   By: Norman Gatlin M.D.   On: 06/14/2024 21:04    MR BRAIN WO CONTRAST Result Date: 06/14/2024 CLINICAL DATA:  Mental status change, unknown cause. EXAM: MRI HEAD WITHOUT CONTRAST TECHNIQUE: Multiplanar, multiecho pulse sequences of the brain and surrounding structures were obtained without intravenous contrast. COMPARISON:  Head MRI 03/26/2024 FINDINGS: Brain: A punctate acute infarct is noted in the left pons. Patchy T2 hyperintensities in the cerebral white matter bilaterally are similar to the prior MRI and are nonspecific but compatible with moderate chronic small vessel ischemic disease. A chronic lacunar infarct at the posterior aspect of the left putamen is unchanged. There is mild generalized cerebral atrophy. Vascular: Major intracranial vascular flow voids are preserved. Skull and upper cervical spine: Unremarkable bone marrow signal. Sinuses/Orbits: Bilateral cataract extraction. Minimal mucosal thickening in the paranasal sinuses. Trace bilateral mastoid fluid. Other: None. IMPRESSION: 1. Punctate acute left pontine infarct. 2. Moderate chronic small vessel ischemic disease. Electronically Signed   By: Dasie Hamburg M.D.   On: 06/14/2024 19:11    DG Chest  Port 1 View Result Date: 06/14/2024 CLINICAL DATA:  Lethargy possible sepsis EXAM: PORTABLE CHEST 1 VIEW COMPARISON:  04/10/2024 FINDINGS: The heart size and mediastinal contours are within normal limits. Both lungs are clear. The visualized skeletal structures are unremarkable. Aortic atherosclerosis. Multiple skin fold artifact over left chest. IMPRESSION: No active disease. Electronically Signed   By: Luke Bun M.D.   On: 06/14/2024 17:00    DG Knee Complete 4 Views Right Result Date: 06/14/2024 CLINICAL DATA:  Right knee pain EXAM: RIGHT KNEE - COMPLETE 4+ VIEW COMPARISON:  None  Available. FINDINGS: No evidence of fracture, dislocation, or joint effusion. No evidence of arthropathy or other focal bone abnormality. Soft tissues are unremarkable. IMPRESSION: No acute osseous findings. Electronically Signed   By: Michaeline Blanch M.D.   On: 06/14/2024 15:12    DG Tibia/Fibula Right Result Date: 06/14/2024 CLINICAL DATA:  Pain EXAM: RIGHT TIBIA AND FIBULA - 2 VIEW COMPARISON:  Same day knee radiographs FINDINGS: There is no evidence of fracture or other focal bone lesions. Scattered vascular calcifications. No soft tissue swelling. IMPRESSION: No acute osseous findings. Electronically Signed   By: Michaeline Blanch M.D.   On: 06/14/2024 15:11       Assessment/Plan: Diagnosis: 22-year-old female status post left pontine infarct likely due to small vessel disease. Does the need for close, 24 hr/day medical supervision in concert with the patient's rehab needs make it unreasonable for this patient to be served in a less intensive setting? Yes Co-Morbidities requiring supervision/potential complications:  - Chronic kidney disease stage IIIb -Urosepsis -Hypertension- -diabetes Due to bladder management, bowel management, safety, skin/wound care, disease management, medication administration, pain management, and patient education, does the patient require 24 hr/day rehab nursing? Yes Does the patient require  coordinated care of a physician, rehab nurse, therapy disciplines of PT, OT, ?SLP to address physical and functional deficits in the context of the above medical diagnosis(es)? Yes Addressing deficits in the following areas: balance, endurance, locomotion, strength, transferring, bowel/bladder control, bathing, dressing, feeding, grooming, toileting, cognition, and psychosocial support Can the patient actively participate in an intensive therapy program of at least 3 hrs of therapy per day at least 5 days per week? Yes The potential for patient to make measurable gains while on inpatient rehab is good Anticipated functional outcomes upon discharge from inpatient rehab are modified independent and supervision  with PT, modified independent and supervision with OT, modified independent and supervision with SLP. Estimated rehab length of stay to reach the above functional goals is: 7 days Anticipated discharge destination: Home Overall Rehab/Functional Prognosis: excellent   POST ACUTE RECOMMENDATIONS: This patient's condition is appropriate for continued rehabilitative care in the following setting: CIR Patient has agreed to participate in recommended program. N/A Note that insurance prior authorization may be required for reimbursement for recommended care.   Comment: Spoke with son who was at bedside. He was concerned about her confusion although he thought it might be related to hypoglycemia. (She was eating lunch when I came in). In speaking with nurse, she had been experiencing more confusion since last night. Recommended to nurse that if improving CBG's didn't improve her MS, that her attending should be contacted for reassessment, ?HCT.  Rehab Admissions Coordinator to follow up regarding potential rehab admit.     MEDICAL RECOMMENDATIONS: ? Repeat HCT if AMS doesn't improve with lunch.      I have personally performed a face to face diagnostic evaluation of this patient. Additionally, I  have examined the patient's medical record including any pertinent labs and radiographic images.     Thanks,   Arthea ONEIDA Gunther, MD 06/16/2024          Routing History

## 2024-06-17 NOTE — Care Management Important Message (Signed)
 Important Message  Patient Details  Name: Sara [name] MRN: [id number] Date of Birth: [birth year]   Important Message Given:  Yes - Medicare IM     Claretta Deed 06/17/2024, 3:26 PM

## 2024-06-17 NOTE — Progress Notes (Signed)
 Occupational Therapy Treatment Patient Details Name: Sara [name] MRN: [id number] DOB: 11-25-1940 Today's Date: 06/17/2024   History of present illness Pt is an 83 y/o F admitted on 06/14/24 after presenting for evaluation of gait imbalance x 2-3 days. MRI brain without contrast showed punctate acute left pontine infarct & moderate chronic small vessel ischemic disease. Pt is also being treated for severe sepsis & possible UTI. PMH: CKD 3B, DM2, HTN, HLD, CVA   OT comments  Pt with slow progression toward established OT goals. Slid down in chair with BLE handing off recliner surface on arrival; repositioned at beginning of session, with RN reporting she thinks the pt was left in chair all night and asking OT leave her in bed at end of session. Pt with max reports of why do I feel so weird able to state she has infection; also CVA with cues, but difficulty relating how she feels to these dx. Pt with difficulty word finding throughout, highly internally and externally distracted needing mod-max cues for attention and safety during functional mobility to sustain task. Pt states she needs to use the restroom, but no BM. Dizziness reported during standing tasks. Washed hands at sink with min A; limited by dry heaving and nausea. RN aware; BP stable but lower than prior readings; pt unable to report if she ate overnight but nausea seemed to subside with water  and protein shake intake. Due to significant functional decline from baseline recommending intensive multidisciplinary rehabilitation >3 hours/day to optimize safety and independence in ADL.        If plan is discharge home, recommend the following:  A little help with walking and/or transfers;A little help with bathing/dressing/bathroom;Assistance with cooking/housework;Assist for transportation;Help with stairs or ramp for entrance;Supervision due to cognitive status   Equipment Recommendations  Other (comment) (defer)    Recommendations for  Other Services Speech consult;Rehab consult    Precautions / Restrictions Precautions Precautions: Fall Restrictions Weight Bearing Restrictions Per Provider Order: No       Mobility Bed Mobility Overal bed mobility: Needs Assistance Bed Mobility: Sit to Supine       Sit to supine: Min assist        Transfers Overall transfer level: Needs assistance Equipment used: Rolling walker (2 wheels) Transfers: Sit to/from Stand Sit to Stand: Min assist           General transfer comment: Assist to power up, cues for hand placement     Balance Overall balance assessment: Needs assistance Sitting-balance support: Feet supported Sitting balance-Leahy Scale: Fair Sitting balance - Comments: supervision static sitting EOB   Standing balance support: Bilateral upper extremity supported, Reliant on assistive device for balance, During functional activity Standing balance-Leahy Scale: Fair                             ADL either performed or assessed with clinical judgement   ADL Overall ADL's : Needs assistance/impaired Eating/Feeding: Set up;Sitting   Grooming: Minimal assistance;Standing;Wash/dry hands Grooming Details (indicate cue type and reason): for problem solving, attention to task, balance.                 Toilet Transfer: Minimal assistance;Cueing for safety;Cueing for sequencing;Rolling walker (2 wheels);Ambulation;Regular Toilet;Grab bars   Toileting- Clothing Manipulation and Hygiene: Minimal assistance;Sit to/from stand       Functional mobility during ADLs: Rolling walker (2 wheels);Minimal assistance;Cueing for sequencing;Cueing for safety      Extremity/Trunk Assessment Upper Extremity  Assessment Upper Extremity Assessment: Generalized weakness   Lower Extremity Assessment Lower Extremity Assessment: Defer to PT evaluation        Vision   Vision Assessment?: Vision impaired- to be further tested in functional  context Additional Comments: improved RW management this session. Will continue to assess   Perception     Praxis     Communication Communication Communication: Impaired Factors Affecting Communication: Hearing impaired;Difficulty expressing self   Cognition Arousal: Alert Behavior During Therapy: WFL for tasks assessed/performed Cognition: Cognition impaired   Orientation impairments: Time, Situation Awareness: Intellectual awareness impaired, Online awareness impaired (pt continuously states I feel weird. emerging intellectual awareness) Memory impairment (select all impairments): Short-term memory, Declarative long-term memory (pt demonstrates some carryover after re-orientation. pt aware she has infection, once re-oriented to CVA, was able to restate this multiple times.) Attention impairment (select first level of impairment): Focused attention (highly internally and externally distracted) Executive functioning impairment (select all impairments): Organization, Sequencing, Problem solving, Reasoning OT - Cognition Comments: max cues for safety, attention to task with mobility. able to sequence through grooming tasks with cues for problem solving                 Following commands: Impaired Following commands impaired: Follows one step commands with increased time, Follows one step commands inconsistently      Cueing   Cueing Techniques: Verbal cues, Gestural cues, Tactile cues, Visual cues  Exercises      Shoulder Instructions       General Comments HR max 135. pt reports intermittent dizziness with BPs in standing 114-116/60s and MAP as low as 62. pt with nausea and dry heaving while standing at sink, session limited by this. pt perseverative on asking what is going on with her    Pertinent Vitals/ Pain       Pain Assessment Pain Assessment: Faces Faces Pain Scale: Hurts little more Pain Location: generalized Pain Descriptors / Indicators: Discomfort Pain  Intervention(s): Monitored during session  Home Living                                          Prior Functioning/Environment              Frequency  Min 2X/week        Progress Toward Goals  OT Goals(current goals can now be found in the care plan section)  Progress towards OT goals: Progressing toward goals  Acute Rehab OT Goals Patient Stated Goal: why do I feel so weird, Lord please help me OT Goal Formulation: With patient/family Time For Goal Achievement: 06/29/24 Potential to Achieve Goals: Good ADL Goals Pt Will Perform Grooming: with supervision;standing Pt Will Perform Upper Body Dressing: with supervision;standing Pt Will Perform Lower Body Dressing: with supervision;sit to/from stand Pt Will Transfer to Toilet: with supervision;ambulating Additional ADL Goal #1: pt will follow 2 step commands during ADL with 1 verbal cue for memory  Plan      Co-evaluation                 AM-PAC OT 6 Clicks Daily Activity     Outcome Measure   Help from another person eating meals?: None Help from another person taking care of personal grooming?: A Little Help from another person toileting, which includes using toliet, bedpan, or urinal?: A Little Help from another person bathing (including washing, rinsing, drying)?: A Little Help  from another person to put on and taking off regular upper body clothing?: A Little Help from another person to put on and taking off regular lower body clothing?: A Little 6 Click Score: 19    End of Session Equipment Utilized During Treatment: Gait belt;Rolling walker (2 wheels)  OT Visit Diagnosis: Unsteadiness on feet (R26.81);Muscle weakness (generalized) (M62.81);Other symptoms and signs involving cognitive function;Cognitive communication deficit (R41.841)   Activity Tolerance Patient tolerated treatment well   Patient Left in bed;with bed alarm set;with call bell/phone within reach   Nurse  Communication Mobility status        Time: 9249-9167 OT Time Calculation (min): 42 min  Charges: OT General Charges $OT Visit: 1 Visit OT Treatments $Self Care/Home Management : 23-37 mins $Therapeutic Activity: 8-22 mins  Sara [name], OTD, OTR/L [facility] Acute Rehabilitation Office: [phone]   Sara [name] 06/17/2024, 8:54 AM

## 2024-06-17 NOTE — PMR Pre-admission (Signed)
 PMR Admission Coordinator Pre-Admission Assessment  Patient: Sara [name] is an 83 y.o., female MRN: [id number] DOB: Aug 18, 1941 Height: 5' 1 (154.9 cm) Weight: 48.3 kg              Insurance Information HMO: Yes    PPO:      PCP:      IPA:      80/20:      OTHER: Group [id number] PRIMARY: UHC Medicare      Policy#: [id number]      Subscriber: patient CM Name: Heal[employer]      Phone#:       Fax#: [phone] Pre-Cert#: [id number] received approval on 06/17/24 from [place] with update due on 06/23/24 and fax update to [phone]  Employer: Retired Benefits:  Phone #: [phone]     Name: Verified online at [email or web address] Eff. Date: 12/28/23     Deduct: $0      Out of Pocket Max: $3900 (Met [phone])      Life Max: n/a  CIR: $395/day for days 1-6      SNF: $0 days 1-20; $203 for days 21-100 Outpatient:      Co-Pay: $15/visit Home Health: 100%      Co-Pay: none DME: 80%     Co-Pay: 20% Providers: in network  SECONDARY:       Policy#:       Phone#:   Artist:       Phone#:   The Data processing manager" for patients in Inpatient Rehabilitation Facilities with attached "Privacy Act Statement-Health Care Records" was provided and verbally reviewed with: Patient and Family  Emergency Contact Information Contact Information     Name Relation Home Work [place] Son   [phone]   [place] Daughter   [phone]      Other Contacts     Name Relation Home Work Mobile   [place],ROBIN Daughter [phone]        Current Medical History  Patient Admitting Diagnosis: L pontine infarct, sepsis  History of Present Illness: An 83 y.o. female with history of CKD stage IIIb, chronic dysphagia due to hypoglossal nerve palsy, type 2 diabetes, hypertension who presented on 06/15/2024 with a history since Friday of balance difficulties.  Patient was found to have severe sepsis due to Klebsiella UTI.  Additionally MRI of the brain ultimately was performed  and demonstrated a punctate acute left pontine infarct as well as small vessel disease.  CT angio was not performed given her renal status.  Patient also with acute kidney injury superimposed upon her chronic disease.  Renal ultrasound without any acute abnormalities.  Foley catheter was placed on admission.  Transthoracic echoc with normal ejection fraction and grade 1 diastolic dysfunction.  Patient remains on Rocephin  for her urosepsis.  She received bicarb infusion for her renal abnormalities.  Patient was up with therapy yesterday and transferred sit to stand with min assist and walked 50 feet min assist to contact-guard assistance.  Patient lives at home with her children in a 1 level house with one-step to enter.  Patient was modified independent using a rollator at home.  She also performed basic ADLs and was cooking independently prior to this admission.   Complete NIHSS TOTAL: 1 Glasgow Coma Scale Score: 14  Patient's medical record from [facility] has been reviewed by the rehabilitation admission coordinator and physician.  Past Medical History  Past Medical History:  Diagnosis Date   Allergy    Dizziness 02/21/2018   Estrogen deficiency  01/13/2018   Need for prophylactic vaccination against Streptococcus pneumoniae (pneumococcus) 01/13/2018   Normocytic anemia 04/11/2024   Right knee pain 05/29/2019   Routine general medical examination at a health care facility 01/13/2018   Seasonal allergies 06/21/2020   Vaginal itching 06/21/2020   Vision changes 01/13/2018    Has the patient had major surgery during 100 days prior to admission? No  Family History  family history includes Alzheimer's disease in her brother and father; Cancer in her brother and sister; Diabetes in her mother; Heart disease in her mother; Other in her mother.   Current Medications   Current Facility-Administered Medications:     stroke: early stages of recovery book, , Does not apply, Once, Tobie Jorie SAUNDERS, MD   acetaminophen  (TYLENOL ) tablet 650 mg, 650 mg, Oral, Q4H PRN, 650 mg at 06/16/24 1515 **OR** acetaminophen  (TYLENOL ) 160 MG/5ML solution 650 mg, 650 mg, Per Tube, Q4H PRN **OR** acetaminophen  (TYLENOL ) suppository 650 mg, 650 mg, Rectal, Q4H PRN, Tobie, Vishal R, MD   aspirin  EC tablet 81 mg, 81 mg, Oral, Daily, Tobie, Vishal R, MD, 81 mg at 06/17/24 0916   atorvastatin  (LIPITOR) tablet 40 mg, 40 mg, Oral, QHS, Patel, Vishal R, MD, 40 mg at 06/16/24 2042   cephALEXin  (KEFLEX ) capsule 500 mg, 500 mg, Oral, Q12H, Ghimire, Donalda HERO, MD   Chlorhexidine  Gluconate Cloth 2 % PADS 6 each, 6 each, Topical, Daily, Ghimire, Donalda HERO, MD, 6 each at 06/17/24 0917   heparin  injection 5,000 Units, 5,000 Units, Subcutaneous, Q8H, Patel, Vishal R, MD, 5,000 Units at 06/17/24 0553   insulin  aspart (novoLOG ) injection 0-9 Units, 0-9 Units, Subcutaneous, TID WC, Ghimire, Donalda HERO, MD   lactated ringers  infusion, , Intravenous, Continuous, Ghimire, Donalda HERO, MD   lactose free nutrition (BOOST PLUS) liquid 237 mL, 237 mL, Oral, TID WC, Ghimire, Shanker M, MD, 237 mL at 06/17/24 0917   melatonin tablet 5 mg, 5 mg, Oral, QHS, Ghimire, Shanker M, MD, 5 mg at 06/16/24 2044   multivitamin with minerals tablet 1 tablet, 1 tablet, Oral, Daily, Ghimire, Donalda HERO, MD, 1 tablet at 06/17/24 9083   ondansetron  (ZOFRAN ) injection 4 mg, 4 mg, Intravenous, Q6H PRN, Patel, Vishal R, MD, 4 mg at 06/16/24 1425   QUEtiapine  (SEROQUEL ) tablet 25 mg, 25 mg, Oral, QHS PRN, Ghimire, Donalda HERO, MD   senna-docusate (Senokot-S) tablet 1 tablet, 1 tablet, Oral, QHS PRN, Tobie Jorie SAUNDERS, MD   tamsulosin  (FLOMAX ) capsule 0.4 mg, 0.4 mg, Oral, Daily, Ghimire, Shanker M, MD, 0.4 mg at 06/17/24 9083   thiamine  (VITAMIN B1) tablet 100 mg, 100 mg, Oral, Daily, Ghimire, Donalda HERO, MD, 100 mg at 06/17/24 9083  Patients Current Diet:  Diet Order             Diet heart healthy/carb modified Fluid consistency: Thin  Diet effective now            Diet - low sodium heart healthy           Diet Carb Modified                   Precautions / Restrictions Precautions Precautions: Fall Restrictions Weight Bearing Restrictions Per Provider Order: No   Has the patient had 2 or more falls or a fall with injury in the past year?No  Prior Activity Level Limited Community (1-2x/wk): Went to church and to the store about 2X a week  Prior Functional Level Prior Function Prior Level of Function : Independent/Modified Independent  Mobility Comments: Ambulatory with rollator, denies falls. ADLs Comments: Bathes & dresses without assistance, cooks (cooked ribs for family last weekend).  Self Care: Did the patient need help bathing, dressing, using the toilet or eating?  Independent  Indoor Mobility: Did the patient need assistance with walking from room to room (with or without device)? Independent  Stairs: Did the patient need assistance with internal or external stairs (with or without device)? Independent  Functional Cognition: Did the patient need help planning regular tasks such as shopping or remembering to take medications? Independent  Patient Information Are you of Hispanic, Latino/a,or Spanish origin?: A. No, not of Hispanic, Latino/a, or Spanish origin What is your race?: B. Black or African American Do you need or want an interpreter to communicate with a doctor or health care staff?: 0. No Patient information obtained via proxy : Yes  Patient's Response To:  Health Literacy and Transportation Is the patient able to respond to health literacy and transportation needs?: No Health Literacy - How often do you need to have someone help you when you read instructions, pamphlets, or other written material from your doctor or pharmacy?: Patient unable to respond In the past 12 months, has lack of transportation kept you from medical appointments or from getting medications?: No In the past 12 months, has lack of  transportation kept you from meetings, work, or from getting things needed for daily living?: No Higher education careers adviser obtained via proxy: Yes  Journalist, newspaper / Equipment Home Equipment: Rollator (4 wheels)  Prior Device Use: Indicate devices/aids used by the patient prior to current illness, exacerbation or injury? Walker  Current Functional Level Cognition  Orientation Level: Disoriented to situation    Extremity Assessment (includes Sensation/Coordination)  Upper Extremity Assessment: Generalized weakness  Lower Extremity Assessment: Defer to PT evaluation    ADLs  Overall ADL's : Needs assistance/impaired Eating/Feeding: Set up, Sitting Grooming: Minimal assistance, Standing, Wash/dry hands Grooming Details (indicate cue type and reason): for problem solving, attention to task, balance. Upper Body Bathing: Set up, Sitting Lower Body Bathing: Minimal assistance, Sit to/from stand Upper Body Dressing : Set up, Sitting Lower Body Dressing: Minimal assistance, Sit to/from stand Toilet Transfer: Minimal assistance, Cueing for safety, Cueing for sequencing, Rolling walker (2 wheels), Ambulation, Regular Toilet, Grab bars Toileting- Clothing Manipulation and Hygiene: Minimal assistance, Sit to/from stand Functional mobility during ADLs: Rolling walker (2 wheels), Minimal assistance, Cueing for sequencing, Cueing for safety    Mobility  Overal bed mobility: Needs Assistance Bed Mobility: Sit to Supine Supine to sit: Min assist, Contact guard, Used rails, HOB elevated (exit L side of bed) Sit to supine: Min assist General bed mobility comments: OOB in chair    Transfers  Overall transfer level: Needs assistance Equipment used: Rolling walker (2 wheels) Transfers: Sit to/from Stand Sit to Stand: Min assist General transfer comment: Assist to power up, cues for hand placement    Ambulation / Gait / Stairs / Wheelchair Mobility   Ambulation/Gait Ambulation/Gait assistance: Editor, commissioning (Feet): 100 Feet Assistive device: Rollator (4 wheels) Gait Pattern/deviations: Step-through pattern, Decreased stride length, Drifts right/left General Gait Details: Up to minA for dynamic balance due to drift, cues for environmental navigation and maneuvering around obstacles Gait velocity: decreased Gait velocity interpretation: <1.8 ft/sec, indicate of risk for recurrent falls    Posture / Balance Dynamic Sitting Balance Sitting balance - Comments: supervision static sitting EOB Balance Overall balance assessment: Needs assistance Sitting-balance support: Feet supported Sitting balance-Leahy Scale:  Fair Sitting balance - Comments: supervision static sitting EOB Standing balance support: Bilateral upper extremity supported, Reliant on assistive device for balance, During functional activity Standing balance-Leahy Scale: Fair    Special considerations/ Life events None     Previous Home Environment (from acute therapy documentation) Living Arrangements: Children Available Help at Discharge: Family Type of Home: House Home Layout: One level Home Access: Stairs to enter Secretary/administrator of Steps: 1 Bathroom Shower/Tub: Associate Professor: Yes How Accessible: Accessible via walker Home Care Services: No  Discharge Living Setting Plans for Discharge Living Setting: Patient's home, House, Lives with (comment) (Lives with daughter) Type of Home at Discharge: House Discharge Home Layout: One level Discharge Home Access: Stairs to enter Entrance Stairs-Rails: Right, Left, Can reach both Entrance Stairs-Number of Steps: 2 Discharge Bathroom Shower/Tub: Tub/shower unit, Curtain Discharge Bathroom Toilet: Standard Discharge Bathroom Accessibility: Yes How Accessible: Accessible via walker Does the patient have any problems obtaining your medications?:  No  Social/Family/Support Systems Patient Roles: Parent (Has a daughter and a son.) Contact Information: [name] - son - [phone] Anticipated Caregiver: Lorrayne Ewing - daughter Anticipated Caregiver's Contact Information: Lorrayne - daughter - [phone] Ability/Limitations of Caregiver: Dtr works days Caregiver Availability: Intermittent Discharge Plan Discussed with Primary Caregiver: Yes Is Caregiver In Agreement with Plan?: Yes Does Caregiver/Family have Issues with Lodging/Transportation while Pt is in Rehab?: No   Goals Patient/Family Goal for Rehab: PT/OT/SLP mod I and supervision goals Expected length of stay: 7 days Pt/Family Agrees to Admission and willing to participate: Yes Program Orientation Provided & Reviewed with Pt/Caregiver Including Roles  & Responsibilities: Yes   Decrease burden of Care through IP rehab admission: N/A   Possible need for SNF placement upon discharge: Not planned   Patient Condition: This patient's condition remains as documented in the consult dated 06/16/24, in which the Rehabilitation Physician determined and documented that the patient's condition is appropriate for intensive rehabilitative care in an inpatient rehabilitation facility. Will admit to inpatient rehab today.  Preadmission Screen Completed By:  Lovett CHRISTELLA Ropes, RN, 06/17/2024 12:54 PM ______________________________________________________________________   Discussed status with Dr. Lorilee on8/20/25 at 0915 and received approval for admission today.  Admission Coordinator:  Lovett CHRISTELLA Ropes, time1249/Date8/20/25

## 2024-06-17 NOTE — H&P (Signed)
 Physical Medicine and Rehabilitation Admission H&P    Chief Complaint  Patient presents with   Functional deficits due to CVA    HPI: Sara [name] 83 year old female with a past medical history of chronic kidney disease (CKD) stage 3b, type 2 diabetes, hypertension, and hyperlipidemia. The patient presented to the [facility] on 06/15/2024 for gait and balance issues that developed over the past 2-3 days. She reported having to brace herself using walls and furniture in her home due to instability. The patient also noted ongoing issues with dysphagia and poor oral intake, leading to several recent admissions for acute kidney injury (AKI).  ED labs: WBC 18.6, hemoglobin 10.1, platelets 302, sodium 130, potassium 6.4, bicarb 13, BUN 130, creatinine 9.52, serum glucose 323, lactic acid 3.9 > 2.3. Patient was found to have severe sepsis due to Klebsiella UTI. Nephrology consulted.  Renal ultrasound without any acute abnormalities.  A Foley catheter was placed on admission and nephrology was consulted where she received bicarb infusion, IVFs, and empiric antibiotics for urosepsis.   Additionally MRI of brain showed punctate acute left pontine infarct and moderate chronic small vessel ischemic disease. Neurology was consulted based on findings and felt CVA to be small and likely ischemic in etiology.  Recommendations were to continue aspirin  81 mg daily and delay CT angio until renal function improved. Transthoracic echo revealed normal ejection fraction grade 1 diastolic dysfunction. Per chart review the patient lives at home with her children in a 1 level home with one-step to enter and has been modified independent use of rollator at home.  She currently transfers sit to stand with min assist and walked 50 feet min assist to CGA. Therapy evaluations completed due to patient decreased functional mobility was admitted for a comprehensive rehab program. Patient was just nauseous and is mildly  tachycardic at rest.   Review of Systems  Constitutional: Negative.   HENT: Negative.    Eyes: Negative.   Respiratory:  Negative for cough and shortness of breath.   Cardiovascular:  Negative for chest pain, palpitations and leg swelling.  Gastrointestinal:  Negative for abdominal pain and constipation.       ? LBM   Genitourinary:        Foley cath   Musculoskeletal: Negative.   Skin: Negative.   Neurological:  Negative for headaches.  Psychiatric/Behavioral:  The patient has insomnia.      Past Medical History:  Diagnosis Date   Allergy    Dizziness 02/21/2018   Estrogen deficiency 01/13/2018   Need for prophylactic vaccination against Streptococcus pneumoniae (pneumococcus) 01/13/2018   Normocytic anemia 04/11/2024   Right knee pain 05/29/2019   Routine general medical examination at a health care facility 01/13/2018   Seasonal allergies 06/21/2020   Vaginal itching 06/21/2020   Vision changes 01/13/2018   Past Surgical History:  Procedure Laterality Date   DG THUMB RIGHT HAND ([facility] HX) Right    EYE SURGERY     IR GASTROSTOMY TUBE MOD SED  09/05/2022   IR GASTROSTOMY TUBE REMOVAL  12/04/2022   TUBAL LIGATION     Family History  Problem Relation Age of Onset   Heart disease Mother    Diabetes Mother    Other Mother        Visual merchandiser   Alzheimer's disease Father    Cancer Sister    Cancer Brother    Alzheimer's disease Brother    Breast cancer Neg Hx    Social History:  reports  that she has never smoked. She has never used smokeless tobacco. She reports that she does not drink alcohol  and does not use drugs. Allergies: No Known Allergies Medications Prior to Admission  Medication Sig Dispense Refill   Accu-Chek Softclix Lancets lancets Please use to check blood sugar up to 4 times daily. 400 each 1   [START ON 06/18/2024] aspirin  EC 81 MG tablet Take 1 tablet (81 mg total) by mouth daily. Swallow whole.     atorvastatin  (LIPITOR) 40 MG tablet Place 1 tablet (40 mg  total) into feeding tube daily. (Patient taking differently: Take 40 mg by mouth at bedtime.) 90 tablet 0   blood glucose meter kit and supplies KIT Dispense based on patient and insurance preference. Use up to four times daily as directed. 1 each 0   cephALEXin  (KEFLEX ) 500 MG capsule Take 1 capsule (500 mg total) by mouth every 12 (twelve) hours for 5 days.     cetirizine  (ZYRTEC ) 10 MG tablet Take 1 tablet (10 mg total) by mouth daily as needed for allergies. 30 tablet 11   cholecalciferol (VITAMIN D3) 25 MCG (1000 UNIT) tablet Take 1,000 Units by mouth daily.     esomeprazole  (NEXIUM ) 10 MG packet Take 10 mg by mouth daily before breakfast. (Patient not taking: Reported on 06/14/2024) 30 each 12   folic acid  (FOLVITE ) 1 MG tablet Take 1 tablet (1 mg total) by mouth daily. (Patient taking differently: Take 1 mg by mouth at bedtime.) 90 tablet 0   glucose blood (ACCU-CHEK GUIDE) test strip USE 1  4 TIMES DAILY 400 each 0   hydroxypropyl methylcellulose / hypromellose (ISOPTO TEARS / GONIOVISC) 2.5 % ophthalmic solution Place 1 drop into both eyes 4 (four) times daily as needed for dry eyes.     insulin  aspart (NOVOLOG  FLEXPEN) 100 UNIT/ML FlexPen 0-9 Units, Subcutaneous, 3 times daily with meals CBG < 70: Implement Hypoglycemia measures CBG 70 - 120: 0 units CBG 121 - 150: 1 unit CBG 151 - 200: 2 units CBG 201 - 250: 3 units CBG 251 - 300: 5 units CBG 301 - 350: 7 units CBG 351 - 400: 9 units CBG > 400: call MD     Insulin  Pen Needle (PEN NEEDLES 3/16) 31G X 5 MM MISC Use as instructed to inject insulin  once daily 90 each 3   lactated ringers  infusion Inject 75 mLs into the vein continuous for 1 day.     lactated ringers  infusion 75 cc/hr for 1 day     lactose free nutrition (BOOST PLUS) LIQD Take 237 mLs by mouth 3 (three) times daily with meals.     melatonin 5 MG TABS Take 1 tablet (5 mg total) by mouth at bedtime.     metoprolol  tartrate (LOPRESSOR ) 25 MG tablet Take 1 tablet (25 mg total) by  mouth 2 (two) times daily.     mirtazapine  (REMERON ) 15 MG tablet Take 15 mg by mouth at bedtime. 90 day supply 03/2024     [START ON 06/18/2024] Multiple Vitamin (MULTIVITAMIN WITH MINERALS) TABS tablet Take 1 tablet by mouth daily.     QUEtiapine  (SEROQUEL ) 25 MG tablet Take 1 tablet (25 mg total) by mouth at bedtime as needed (agitation/delirium).     [START ON 06/18/2024] tamsulosin  (FLOMAX ) 0.4 MG CAPS capsule Take 1 capsule (0.4 mg total) by mouth daily.     [START ON 06/18/2024] thiamine  (VITAMIN B-1) 100 MG tablet Take 1 tablet (100 mg total) by mouth daily.  Home: Home Living Family/patient expects to be discharged to:: Private residence Living Arrangements: Children Available Help at Discharge: Family Type of Home: House Home Access: Stairs to enter Secretary/administrator of Steps: 1 Home Layout: One level Bathroom Shower/Tub: Engineer, manufacturing systems: Standard Bathroom Accessibility: Yes Home Equipment: Rollator (4 wheels)   Functional History: Prior Function Prior Level of Function : Independent/Modified Independent Mobility Comments: Ambulatory with rollator, denies falls. ADLs Comments: Bathes & dresses without assistance, cooks (cooked ribs for family last weekend).   Functional Status:  Mobility: Bed Mobility Overal bed mobility: Needs Assistance Bed Mobility: Sit to Supine Supine to sit: Min assist, Contact guard, Used rails, HOB elevated (exit L side of bed) Sit to supine: Min assist General bed mobility comments: OOB in chair Transfers Overall transfer level: Needs assistance Equipment used: Rolling walker (2 wheels) Transfers: Sit to/from Stand Sit to Stand: Min assist General transfer comment: Assist to power up, cues for hand placement Ambulation/Gait Ambulation/Gait assistance: Min assist Gait Distance (Feet): 100 Feet Assistive device: Rollator (4 wheels) Gait Pattern/deviations: Step-through pattern, Decreased stride length, Drifts  right/left General Gait Details: Up to minA for dynamic balance due to drift, cues for environmental navigation and maneuvering around obstacles Gait velocity: decreased Gait velocity interpretation: <1.8 ft/sec, indicate of risk for recurrent falls   ADL: ADL Overall ADL's : Needs assistance/impaired Eating/Feeding: Set up, Sitting Grooming: Minimal assistance, Standing, Wash/dry hands Grooming Details (indicate cue type and reason): for problem solving, attention to task, balance. Upper Body Bathing: Set up, Sitting Lower Body Bathing: Minimal assistance, Sit to/from stand Upper Body Dressing : Set up, Sitting Lower Body Dressing: Minimal assistance, Sit to/from stand Toilet Transfer: Minimal assistance, Cueing for safety, Cueing for sequencing, Rolling walker (2 wheels), Ambulation, Regular Toilet, Grab bars Toileting- Clothing Manipulation and Hygiene: Minimal assistance, Sit to/from stand Functional mobility during ADLs: Rolling walker (2 wheels), Minimal assistance, Cueing for sequencing, Cueing for safety   Cognition: Cognition Orientation Level: Disoriented to situation Cognition Arousal: Alert Behavior During Therapy: WFL for tasks assessed/performed    Physical Exam: Blood pressure (!) 140/71, pulse (!) 117, temperature 99.3 F (37.4 C), temperature source Oral, resp. rate 18, height 5' 5 (1.651 m), weight 50.3 kg, SpO2 100%. Physical Exam Gen: no distress, normal appearing, patient's son and daughter are at bedside provide history as patient was recently nauseous and they prefer she be allowed to rest now HEENT: oral mucosa pink and moist, NCAT Cardio: sinus tachycardia Chest: normal effort, normal rate of breathing Abd: soft, non-distended Ext: no edema Psych/neuro: Somnolent  Results for orders placed or performed during the hospital encounter of 06/14/24 (from the past 48 hours)  Glucose, capillary     Status: None   Collection Time: 06/15/24  9:31 PM  Result  Value Ref Range   Glucose-Capillary 93 70 - 99 mg/dL    Comment: Glucose reference range applies only to samples taken after fasting for at least 8 hours.  Lactic acid, plasma     Status: None   Collection Time: 06/16/24  5:45 AM  Result Value Ref Range   Lactic Acid, Venous 1.1 0.5 - 1.9 mmol/L    Comment: Performed at [facility], [address], [place], [place] [address]  Renal function panel     Status: Abnormal   Collection Time: 06/16/24  5:45 AM  Result Value Ref Range   Sodium 136 135 - 145 mmol/L   Potassium 3.5 3.5 - 5.1 mmol/L   Chloride 97 (L) 98 -  111 mmol/L   CO2 23 22 - 32 mmol/L   Glucose, Bld 72 70 - 99 mg/dL    Comment: Glucose reference range applies only to samples taken after fasting for at least 8 hours.   BUN 77 (H) 8 - 23 mg/dL   Creatinine, Ser 3.47 (H) 0.44 - 1.00 mg/dL   Calcium  7.8 (L) 8.9 - 10.3 mg/dL   Phosphorus 4.0 2.5 - 4.6 mg/dL   Albumin 1.6 (L) 3.5 - 5.0 g/dL   GFR, Estimated 6 (L) >60 mL/min    Comment: (NOTE) Calculated using the CKD-EPI Creatinine Equation (2021)    Anion gap 16 (H) 5 - 15    Comment: Performed at [facility], [address] [place], [place] [address]  CBC with Differential/Platelet     Status: Abnormal   Collection Time: 06/16/24  5:45 AM  Result Value Ref Range   WBC 18.0 (H) 4.0 - 10.5 K/uL   RBC 3.67 (L) 3.87 - 5.11 MIL/uL   Hemoglobin 9.7 (L) 12.0 - 15.0 g/dL   HCT 73.7 (L) 63.9 - 53.9 %   MCV 71.4 (L) 80.0 - 100.0 fL   MCH 26.4 26.0 - 34.0 pg   MCHC 37.0 (H) 30.0 - 36.0 g/dL   RDW 85.6 88.4 - 84.4 %   Platelets 223 150 - 400 K/uL   nRBC 0.0 0.0 - 0.2 %   Neutrophils Relative % 79 %   Neutro Abs 14.2 (H) 1.7 - 7.7 K/uL   Lymphocytes Relative 13 %   Lymphs Abs 2.4 0.7 - 4.0 K/uL   Monocytes Relative 5 %   Monocytes Absolute 1.0 0.1 - 1.0 K/uL   Eosinophils Relative 1 %   Eosinophils Absolute 0.1 0.0 - 0.5 K/uL   Basophils Relative 0 %   Basophils Absolute 0.0 0.0 - 0.1 K/uL   Immature  Granulocytes 2 %   Abs Immature Granulocytes 0.27 (H) 0.00 - 0.07 K/uL    Comment: Performed at [facility], [address] [place], [place] [address]  Glucose, capillary     Status: None   Collection Time: 06/16/24  9:07 AM  Result Value Ref Range   Glucose-Capillary 76 70 - 99 mg/dL    Comment: Glucose reference range applies only to samples taken after fasting for at least 8 hours.  Glucose, capillary     Status: None   Collection Time: 06/16/24 11:50 AM  Result Value Ref Range   Glucose-Capillary 78 70 - 99 mg/dL    Comment: Glucose reference range applies only to samples taken after fasting for at least 8 hours.  Glucose, capillary     Status: Abnormal   Collection Time: 06/16/24  2:55 PM  Result Value Ref Range   Glucose-Capillary 228 (H) 70 - 99 mg/dL    Comment: Glucose reference range applies only to samples taken after fasting for at least 8 hours.  Glucose, capillary     Status: Abnormal   Collection Time: 06/16/24  9:44 PM  Result Value Ref Range   Glucose-Capillary 358 (H) 70 - 99 mg/dL    Comment: Glucose reference range applies only to samples taken after fasting for at least 8 hours.  CBC     Status: Abnormal   Collection Time: 06/17/24  4:26 AM  Result Value Ref Range   WBC 15.1 (H) 4.0 - 10.5 K/uL   RBC 3.38 (L) 3.87 - 5.11 MIL/uL   Hemoglobin 8.9 (L) 12.0 - 15.0 g/dL    Comment:  Reticulocyte Hemoglobin testing may be clinically indicated, consider ordering this additional test [id number]    HCT 24.5 (L) 36.0 - 46.0 %   MCV 72.5 (L) 80.0 - 100.0 fL   MCH 26.3 26.0 - 34.0 pg   MCHC 36.3 (H) 30.0 - 36.0 g/dL   RDW 85.6 88.4 - 84.4 %   Platelets 224 150 - 400 K/uL   nRBC 0.0 0.0 - 0.2 %    Comment: Performed at [facility], [address], [place], [place] [address]  Renal function panel     Status: Abnormal   Collection Time: 06/17/24  4:26 AM  Result Value Ref Range   Sodium 134 (L) 135 - 145 mmol/L   Potassium 3.5 3.5 - 5.1 mmol/L    Chloride 92 (L) 98 - 111 mmol/L   CO2 25 22 - 32 mmol/L   Glucose, Bld 304 (H) 70 - 99 mg/dL    Comment: Glucose reference range applies only to samples taken after fasting for at least 8 hours.   BUN 68 (H) 8 - 23 mg/dL   Creatinine, Ser 4.47 (H) 0.44 - 1.00 mg/dL   Calcium  8.2 (L) 8.9 - 10.3 mg/dL   Phosphorus 3.7 2.5 - 4.6 mg/dL   Albumin 1.7 (L) 3.5 - 5.0 g/dL   GFR, Estimated 7 (L) >60 mL/min    Comment: (NOTE) Calculated using the CKD-EPI Creatinine Equation (2021)    Anion gap 17 (H) 5 - 15    Comment: Performed at [facility], [address] [place], [place] [address]  Glucose, capillary     Status: Abnormal   Collection Time: 06/17/24  7:28 AM  Result Value Ref Range   Glucose-Capillary 274 (H) 70 - 99 mg/dL    Comment: Glucose reference range applies only to samples taken after fasting for at least 8 hours.  Glucose, capillary     Status: Abnormal   Collection Time: 06/17/24 11:20 AM  Result Value Ref Range   Glucose-Capillary 319 (H) 70 - 99 mg/dL    Comment: Glucose reference range applies only to samples taken after fasting for at least 8 hours.   No results found.     Blood pressure (!) 140/71, pulse (!) 117, temperature 99.3 F (37.4 C), temperature source Oral, resp. rate 18, height 5' 5 (1.651 m), weight 50.3 kg, SpO2 100%.  Medical Problem List and Plan: 1. Functional deficits secondary to acute left pontine infarct  -patient may shower  -ELOS/Goals: S 7-8 days  Admit to CIR 2.  Antithrombotics: -DVT/anticoagulation:  Pharmaceutical: Heparin   -antiplatelet therapy: continue Aspirin    3. Pain Management: Tylenol  as needed 4. Mood/Behavior/Sleep: LCSW to follow for evaluation and support when available.   -antipsychotic agents: Seroquel    - Sleep aid: Melatonin scheduled 5. Neuropsych/cognition: This patient is capable of making decisions on her own behalf. 6. Skin/Wound Care: routine pressure relief measures  7. Fluids/Electrolytes/Nutrition:  Monitor strict I&O's- follow-up chemistries  -Speech consult  -carb modified diet + boost--continue multivitamin, thiamine  8.  Severe sepsis secondary to complicated UTI: Klebsiella bacteremia  - On IV Rocephin ----transitioned to Keflex  500 mg x 7 days today 8/20 9.  AKI-CKD stage IIIb: Renal function gradually improving, avoid nephrotoxic agents  - Foley catheter in place--begin voiding trial with PVR as renal function improves. (Ordered for 8/22- please check with family if they are ok for this as they were hesitant about removal)  - On lactated Ringer 's??  Continue 10.  Acute urinary retention: Same as above,  continue Flomax  and monitor strict I&O  11.  Acute left pontine infarct versus artifactual finding on MRI: Continue aspirin  per neurology recommendation. 12.  T2DM: 8/18 A1c 6.6   - Monitor CBG ACHS and continue SSI--allow for permissive hyperglycemia  Add Semglee  5U daily  13. HLD: continue atorvastatin  40 mg daily  14.  HTN: Monitor BP per protocol--Lasix/HCTZ on hold starting low-dose metoprolol  25 mg twice daily for mild sinus tachycardia- discussed with Dr. Raenelle who feels tachycardia is physiologic, no hold parameters for therapy recommended  - Monitor for orthostatic hypotension  I have personally performed a face to face diagnostic evaluation, including, but not limited to relevant history and physical exam findings, of this patient and developed relevant assessment and plan.  Additionally, I have reviewed and concur with the physician assistant's documentation above.  Daphne Finders, NP   Sven SHAUNNA Elks, MD 06/17/2024

## 2024-06-17 NOTE — Plan of Care (Signed)
  Problem: Consults Goal: [facility] GENERAL PATIENT EDUCATION Description: See Patient Education module for education specifics. Outcome: Progressing   

## 2024-06-17 NOTE — Discharge Summary (Addendum)
 PATIENT DETAILS Name: Sara [name] Age: 83 y.o. Sex: female Date of Birth: 1941-10-19 MRN: [id number]. Admitting Physician: Jorie [name] Blanch, MD ERE:Duntz, Damian, NP  Admit Date: 06/14/2024 Discharge date: 06/17/2024  Recommendations for Outpatient Follow-up:  Follow up with PCP in 1-2 weeks Please obtain CMP/CBC in one week Voiding trial in the next few days once renal function improves further  Admitted From:  Home  Disposition: Rehabilitation facility   Discharge Condition: good  CODE STATUS:   Code Status: Full Code   Diet recommendation:  Diet Order             Diet heart healthy/carb modified Fluid consistency: Thin  Diet effective now           Diet - low sodium heart healthy           Diet Carb Modified                    Brief Summary: Patient is a 83 y.o.  female with history of CKD stage IIIb, DM-2, HTN, HLD-who presented with unsteady gait/imbalance-patient was found to have severe sepsis secondary to UTI/Klebsiella bacteremia, AKI-and possible acute CVA versus artifactual finding on MRI.   Significant events: 8/17>> admit to [facility].   Significant studies: 8/17>> x-ray right tibia/fibula: No acute findings 8/17>> x-ray right knee: No acute findings 8/17>> CXR: No active disease 8/17>> MRI brain: Punctate acute left pontine infarct (neurology thinks this could be artifactual) 8/17>> renal ultrasound: No hydronephrosis. 8/18>> TTE: EF 65-70%, grade 1 diastolic dysfunction 8/18>> A1c: 6.6   Significant microbiology data: 8/17>> urine culture: Klebsiella pneumoniae  8/17>> blood culture: Klebsiella pneumoniae   Procedures: None   Consults: Nephrology Neurology  Brief Hospital Course: Severe sepsis secondary to complicated UTI and Klebsiella bacteremia Sepsis physiology resolved-leukocytosis downtrending Cultures as above On IV Rocephin -will transition to Keflex  on discharge-complete 7 days total.   Continue to follow CBC to ensure  downtrending leukocytosis while at [facility].   AKI on CKD stage IIIb AKI felt to be multifactorial-secondary to urinary retention/sepsis physiology/volume depletion. Foley catheter placed on admission-this will be continued on discharge to [facility] Renal function gradually improving Avoid nephrotoxic agents Discussed with nephrology-they will continue to follow at [facility] for a few more days. Voiding trial when renal function improves further.   Metabolic acidosis Secondary to worsening AKI Resolved with improvement in renal function/IV bicarb infusion   Hyperkalemia Secondary to AKI Resolved with Lokelma /bicarb infusion   Acute urinary retention Continue Foley Continue Flomax  Consider voiding trial at [facility] when renal function improves further.   Acute left pontine infarct versus artifactual finding on MRI No focal deficits Reviewed neurology input on 8/18-favored to be artifactual-given lack of symptoms Continue aspirin  per neurology recommendation.   Acute metabolic encephalopathy Secondary to AKI/Bacteremia/hospital delirium Neuroimaging negative Continues to have waxing and waning confusion at times but easily redirectable-answers most of my questions appropriately Maintain delirium precautions/maintain circadian rhythm Scheduled melatonin As needed Seroquel  if severe delirium Anticipate as sepsis physiology/renal function improves-encephalopathy showed improved. Continue supportive measures.  DM-2 (A1c 6.6 on 8/18) with uncontrolled hyperglycemia and hypoglycemia. CBGs on higher side today-had hypoglycemia on 8/19 Switch SSI to sensitive scale Allow some amount of permissive hyperglycemia today-will switch to diabetic diet Hopefully-with these measures-CBGs would be better controlled.  If not-can add low-dose Semglee .   Hypertension BP stable Lasix/HCTZ on hold-starting low dose metoprolol  for mild sinus tachycardia Follow/Optimize while at [facility]   HLD Statin.  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) Signs/Symptoms: severe muscle depletion, severe fat depletion Interventions: Boost Plus, MVI, Magic cup, Education    Discharge Diagnoses:  Principal Problem:   Severe sepsis with lactic acidosis (HCC) Active Problems:   Acute renal failure superimposed on stage 3b chronic kidney disease (HCC)   Hypertension associated with diabetes (HCC)   Left pontine stroke (HCC)   Type 2 diabetes mellitus with chronic kidney disease, without long-term current use of insulin  (HCC)   Hyperkalemia   Acute urinary retention   Hyperlipidemia associated with type 2 diabetes mellitus (HCC)   Discharge Instructions:  Activity:  As tolerated with Full fall precautions use walker/cane & assistance as needed   Discharge Instructions     Diet - low sodium heart healthy   Complete by: As directed    Diet Carb Modified   Complete by: As directed    Discharge instructions   Complete by: As directed    Follow with Primary MD  Campbell Reynolds, NP in 1-2 weeks  Please get a complete blood count and chemistry panel checked by your Primary MD at your next visit, and again as instructed by your Primary MD.  Get Medicines reviewed and adjusted: Please take all your medications with you for your next visit with your Primary MD  Laboratory/radiological data: Please request your Primary MD to go over all hospital tests and procedure/radiological results at the follow up, please ask your Primary MD to get all Hospital records sent to his/her office.  In some cases, they will be blood work, cultures and biopsy results pending at the time of your discharge. Please request that your primary care M.D. follows up on these results.  Also Note the following: If you experience worsening of your admission symptoms, develop shortness of breath, life threatening emergency, suicidal or  homicidal thoughts you must seek medical attention immediately by calling 911 or calling your MD immediately  if symptoms less severe.  You must read complete instructions/literature along with all the possible adverse reactions/side effects for all the Medicines you take and that have been prescribed to you. Take any new Medicines after you have completely understood and accpet all the possible adverse reactions/side effects.   Do not drive when taking Pain medications or sleeping medications (Benzodaizepines)  Do not take more than prescribed Pain, Sleep and Anxiety Medications. It is not advisable to combine anxiety,sleep and pain medications without talking with your primary care practitioner  Special Instructions: If you have smoked or chewed Tobacco  in the last 2 yrs please stop smoking, stop any regular Alcohol   and or any Recreational drug use.  Wear Seat belts while driving.  Please note: You were cared for by a hospitalist during your hospital stay. Once you are discharged, your primary care physician will handle any further medical issues. Please note that NO REFILLS for any discharge medications will be authorized once you are discharged, as it is imperative that you return to your primary care physician (or establish a relationship with a primary care physician if you do not have one) for your post hospital discharge needs so that they can reassess your need for medications and monitor your lab values.   Increase activity slowly   Complete by: As directed       Allergies as of 06/17/2024   No Known Allergies      Medication List     STOP taking these medications    furosemide 20  MG tablet Commonly known as: LASIX   hydrochlorothiazide  25 MG tablet Commonly known as: HYDRODIURIL    potassium chloride  10 MEQ tablet Commonly known as: KLOR-CON        TAKE these medications    Accu-Chek Guide test strip Generic drug: glucose blood USE 1  4 TIMES DAILY   Accu-Chek  Softclix Lancets lancets Please use to check blood sugar up to 4 times daily.   aspirin  EC 81 MG tablet Take 1 tablet (81 mg total) by mouth daily. Swallow whole. Start taking on: June 18, 2024   atorvastatin  40 MG tablet Commonly known as: LIPITOR Place 1 tablet (40 mg total) into feeding tube daily. What changed:  how to take this when to take this   blood glucose meter kit and supplies Kit Dispense based on patient and insurance preference. Use up to four times daily as directed.   cephALEXin  500 MG capsule Commonly known as: KEFLEX  Take 1 capsule (500 mg total) by mouth every 12 (twelve) hours for 5 days.   cetirizine  10 MG tablet Commonly known as: ZYRTEC  Take 1 tablet (10 mg total) by mouth daily as needed for allergies.   cholecalciferol 25 MCG (1000 UNIT) tablet Commonly known as: VITAMIN D3 Take 1,000 Units by mouth daily.   esomeprazole  10 MG packet Commonly known as: NEXIUM  Take 10 mg by mouth daily before breakfast.   folic acid  1 MG tablet Commonly known as: FOLVITE  Take 1 tablet (1 mg total) by mouth daily. What changed: when to take this   hydroxypropyl methylcellulose / hypromellose 2.5 % ophthalmic solution Commonly known as: ISOPTO TEARS / GONIOVISC Place 1 drop into both eyes 4 (four) times daily as needed for dry eyes.   lactated ringers  infusion Inject 75 mLs into the vein continuous for 1 day.   lactated ringers  infusion 75 cc/hr for 1 day   lactose free nutrition Liqd Take 237 mLs by mouth 3 (three) times daily with meals.   melatonin 5 MG Tabs Take 1 tablet (5 mg total) by mouth at bedtime.   metoprolol  tartrate 25 MG tablet Commonly known as: LOPRESSOR  Take 1 tablet (25 mg total) by mouth 2 (two) times daily.   mirtazapine  15 MG tablet Commonly known as: REMERON  Take 15 mg by mouth at bedtime. 90 day supply 03/2024   multivitamin with minerals Tabs tablet Take 1 tablet by mouth daily. Start taking on: June 18, 2024    NovoLOG  FlexPen 100 UNIT/ML FlexPen Generic drug: insulin  aspart 0-9 Units, Subcutaneous, 3 times daily with meals CBG < 70: Implement Hypoglycemia measures CBG 70 - 120: 0 units CBG 121 - 150: 1 unit CBG 151 - 200: 2 units CBG 201 - 250: 3 units CBG 251 - 300: 5 units CBG 301 - 350: 7 units CBG 351 - 400: 9 units CBG > 400: call MD   Pen Needles 3/16 31G X 5 MM Misc Use as instructed to inject insulin  once daily   QUEtiapine  25 MG tablet Commonly known as: SEROQUEL  Take 1 tablet (25 mg total) by mouth at bedtime as needed (agitation/delirium).   tamsulosin  0.4 MG Caps capsule Commonly known as: FLOMAX  Take 1 capsule (0.4 mg total) by mouth daily. Start taking on: June 18, 2024   thiamine  100 MG tablet Commonly known as: Vitamin B-1 Take 1 tablet (100 mg total) by mouth daily. Start taking on: June 18, 2024        No Known Allergies   Other Procedures/Studies: ECHOCARDIOGRAM COMPLETE Result Date: 06/15/2024  ECHOCARDIOGRAM REPORT   Patient Name:   JOSCELIN FRAY Date of Exam: 06/15/2024 Medical Rec #:  [id number]         Height:       61.0 in Accession #:    [id number]        Weight:       91.0 lb Date of Birth:  [birth year]         BSA:          1.352 m Patient Age:    83 years          BP:           152/59 mmHg Patient Gender: F                 HR:           111 bpm. Exam Location:  Inpatient Procedure: 2D Echo, Color Doppler and Cardiac Doppler (Both Spectral and Color            Flow Doppler were utilized during procedure). Indications:    Stroke  History:        Patient has prior history of Echocardiogram examinations, most                 recent 04/11/2024. Risk Factors:Diabetes.  Sonographer:    Benard Stallion Referring Phys: [id number] VISHAL R PATEL IMPRESSIONS  1. Left ventricular ejection fraction, by estimation, is 65 to 70%. The left ventricle has normal function. The left ventricle has no regional wall motion abnormalities. Left ventricular diastolic parameters are  consistent with Grade I diastolic dysfunction (impaired relaxation).  2. Right ventricular systolic function is normal. The right ventricular size is normal. There is mildly elevated pulmonary artery systolic pressure. The estimated right ventricular systolic pressure is 37.1 mmHg.  3. The mitral valve is normal in structure. No evidence of mitral valve regurgitation. No evidence of mitral stenosis.  4. The aortic valve is tricuspid. There is mild calcification of the aortic valve. Aortic valve regurgitation is not visualized. No aortic stenosis is present.  5. The inferior vena cava is normal in size with greater than 50% respiratory variability, suggesting right atrial pressure of 3 mmHg. FINDINGS  Left Ventricle: Left ventricular ejection fraction, by estimation, is 65 to 70%. The left ventricle has normal function. The left ventricle has no regional wall motion abnormalities. The left ventricular internal cavity size was normal in size. There is  no left ventricular hypertrophy. Left ventricular diastolic parameters are consistent with Grade I diastolic dysfunction (impaired relaxation). Right Ventricle: The right ventricular size is normal. No increase in right ventricular wall thickness. Right ventricular systolic function is normal. There is mildly elevated pulmonary artery systolic pressure. The tricuspid regurgitant velocity is 2.92  m/s, and with an assumed right atrial pressure of 3 mmHg, the estimated right ventricular systolic pressure is 37.1 mmHg. Left Atrium: Left atrial size was normal in size. Right Atrium: Right atrial size was normal in size. Pericardium: There is no evidence of pericardial effusion. Mitral Valve: The mitral valve is normal in structure. Mild mitral annular calcification. No evidence of mitral valve regurgitation. No evidence of mitral valve stenosis. MV peak gradient, 8.9 mmHg. The mean mitral valve gradient is 4.0 mmHg. Tricuspid Valve: The tricuspid valve is normal in  structure. Tricuspid valve regurgitation is trivial. Aortic Valve: The aortic valve is tricuspid. There is mild calcification of the aortic valve. Aortic valve regurgitation is not visualized. No aortic stenosis is present. Aortic valve mean gradient measures  5.5 mmHg. Aortic valve peak gradient measures 9.7 mmHg. Aortic valve area, by VTI measures 2.09 cm. Pulmonic Valve: The pulmonic valve was normal in structure. Pulmonic valve regurgitation is not visualized. Aorta: The aortic root is normal in size and structure. Venous: The inferior vena cava is normal in size with greater than 50% respiratory variability, suggesting right atrial pressure of 3 mmHg. IAS/Shunts: No atrial level shunt detected by color flow Doppler.  LEFT VENTRICLE PLAX 2D LVIDd:         3.10 cm   Diastology LVIDs:         1.90 cm   LV e' medial:    8.27 cm/s LV PW:         0.90 cm   LV E/e' medial:  9.9 LV IVS:        0.90 cm   LV e' lateral:   7.07 cm/s LVOT diam:     1.70 cm   LV E/e' lateral: 11.6 LV SV:         48 LV SV Index:   35 LVOT Area:     2.27 cm  RIGHT VENTRICLE RV Basal diam:  3.30 cm RV Mid diam:    2.60 cm RV S prime:     19.00 cm/s TAPSE (M-mode): 2.0 cm LEFT ATRIUM             Index        RIGHT ATRIUM          Index LA diam:        2.50 cm 1.85 cm/m   RA Area:     8.01 cm LA Vol (A2C):   25.2 ml 18.65 ml/m  RA Volume:   14.80 ml 10.95 ml/m LA Vol (A4C):   32.6 ml 24.12 ml/m LA Biplane Vol: 31.4 ml 23.23 ml/m  AORTIC VALVE AV Area (Vmax):    2.05 cm AV Area (Vmean):   1.96 cm AV Area (VTI):     2.09 cm AV Vmax:           156.00 cm/s AV Vmean:          109.000 cm/s AV VTI:            0.229 m AV Peak Grad:      9.7 mmHg AV Mean Grad:      5.5 mmHg LVOT Vmax:         141.00 cm/s LVOT Vmean:        94.000 cm/s LVOT VTI:          0.211 m LVOT/AV VTI ratio: 0.92  AORTA Ao Root diam: 2.40 cm MITRAL VALVE                TRICUSPID VALVE MV Area (PHT): 6.54 cm     TR Peak grad:   34.1 mmHg MV Area VTI:   2.13 cm     TR  Vmax:        292.00 cm/s MV Peak grad:  8.9 mmHg MV Mean grad:  4.0 mmHg     SHUNTS MV Vmax:       1.49 m/s     Systemic VTI:  0.21 m MV Vmean:      90.2 cm/s    Systemic Diam: 1.70 cm MV Decel Time: 116 msec MV E velocity: 81.90 cm/s MV A velocity: 162.00 cm/s MV E/A ratio:  0.51 Dalton McleanMD Electronically signed by Ezra Kanner Signature Date/Time: 06/15/2024/8:28:47 PM    Final    US  RENAL Result  Date: 06/14/2024 CLINICAL DATA:  Acute renal failure superimposed on stage 3 chronic kidney disease. EXAM: RENAL / URINARY TRACT ULTRASOUND COMPLETE COMPARISON:  Ultrasound 04/24/2024 FINDINGS: Right Kidney: Renal measurements: 10.9 x 5.7 x 5.5 cm = volume: 179 mL. Increased cortical echogenicity. 1.1 x 1.0 x 0.8 cm cyst. No follow-up recommended. No hydronephrosis. Left Kidney: Renal measurements: 9.9 x 5.2 x 5.6 cm = volume: 152 mL. Increased cortical echogenicity. No mass or hydronephrosis. Bladder: Foley catheter in the decompressed bladder. Other: None. IMPRESSION: Increased cortical echogenicity bilaterally compatible with medical renal disease. No hydronephrosis. Electronically Signed   By: Norman Gatlin M.D.   On: 06/14/2024 21:04   MR BRAIN WO CONTRAST Result Date: 06/14/2024 CLINICAL DATA:  Mental status change, unknown cause. EXAM: MRI HEAD WITHOUT CONTRAST TECHNIQUE: Multiplanar, multiecho pulse sequences of the brain and surrounding structures were obtained without intravenous contrast. COMPARISON:  Head MRI 03/26/2024 FINDINGS: Brain: A punctate acute infarct is noted in the left pons. Patchy T2 hyperintensities in the cerebral white matter bilaterally are similar to the prior MRI and are nonspecific but compatible with moderate chronic small vessel ischemic disease. A chronic lacunar infarct at the posterior aspect of the left putamen is unchanged. There is mild generalized cerebral atrophy. Vascular: Major intracranial vascular flow voids are preserved. Skull and upper cervical spine:  Unremarkable bone marrow signal. Sinuses/Orbits: Bilateral cataract extraction. Minimal mucosal thickening in the paranasal sinuses. Trace bilateral mastoid fluid. Other: None. IMPRESSION: 1. Punctate acute left pontine infarct. 2. Moderate chronic small vessel ischemic disease. Electronically Signed   By: Dasie Hamburg M.D.   On: 06/14/2024 19:11   DG Chest Port 1 View Result Date: 06/14/2024 CLINICAL DATA:  Lethargy possible sepsis EXAM: PORTABLE CHEST 1 VIEW COMPARISON:  04/10/2024 FINDINGS: The heart size and mediastinal contours are within normal limits. Both lungs are clear. The visualized skeletal structures are unremarkable. Aortic atherosclerosis. Multiple skin fold artifact over left chest. IMPRESSION: No active disease. Electronically Signed   By: Luke Bun M.D.   On: 06/14/2024 17:00   DG Knee Complete 4 Views Right Result Date: 06/14/2024 CLINICAL DATA:  Right knee pain EXAM: RIGHT KNEE - COMPLETE 4+ VIEW COMPARISON:  None Available. FINDINGS: No evidence of fracture, dislocation, or joint effusion. No evidence of arthropathy or other focal bone abnormality. Soft tissues are unremarkable. IMPRESSION: No acute osseous findings. Electronically Signed   By: Michaeline Blanch M.D.   On: 06/14/2024 15:12   DG Tibia/Fibula Right Result Date: 06/14/2024 CLINICAL DATA:  Pain EXAM: RIGHT TIBIA AND FIBULA - 2 VIEW COMPARISON:  Same day knee radiographs FINDINGS: There is no evidence of fracture or other focal bone lesions. Scattered vascular calcifications. No soft tissue swelling. IMPRESSION: No acute osseous findings. Electronically Signed   By: Michaeline Blanch M.D.   On: 06/14/2024 15:11   DG SWALLOW FUNC OP MEDICARE SPEECH PATH Result Date: 06/03/2024 Table formatting from the original result was not included. Modified Barium Swallow Study Patient Details Name: CAMELLE HENKELS MRN: [id number] Date of Birth: 12-12-1940 Today's Date: 06/03/2024 HPI/PMH: HPI: Salwa Bai is an 83 y.o. female who was  referred for an OP MBS. PMH: dysphagia with h/o PEG (removed 12/24/22), HTN, HLD, DM-2, CKD 3B with recent admissions 5/29, 6/13, and 04/24/24 for initial concern of CVA; negative workup but positive for AKI and UTI. Family has expressed concerns about possible dysphagia and impact on PO intake. Pt describes waxing/waning appetite. 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 Clinical Impression: Clinical Impression: Pt's swallow function has improved significantly since initial Nov 2023 MBS. She demonstrates mildly prolonged oral preparation with complete recollection; trace oral residue; consistent high penetration of thin liquids (PAS 2) into the larynx with ejection upon completion of the swallow and no aspiration. DIGEST score is Grade 1, mild pharyngeal dysphagia.  Results of study were discussed and viewed in real time with pt and her son, [name]. Recommend pt continue regular solids/thin liquids; pills whole with liquid or puree. No SLP f/u is needed. Factors that may increase risk of adverse event in presence of aspiration Noe & Lianne 2021): No data recorded Recommendations/Plan: Swallowing Evaluation Recommendations Swallowing Evaluation Recommendations Recommendations: PO diet PO Diet Recommendation: Regular; Thin liquids (Level 0) Liquid Administration via: Cup; Straw Medication Administration: Whole meds with liquid Supervision: Patient able to self-feed Oral care recommendations: Oral care BID (2x/day) Treatment Plan Treatment Plan Follow-up recommendations: No SLP follow up Recommendations Recommendations for follow up therapy are one component of a multi-disciplinary discharge planning process, led by the attending physician.  Recommendations may be updated based on patient status, additional functional criteria and insurance authorization.  Assessment: Orofacial Exam: Orofacial Exam Oral Cavity: Oral Hygiene: WFL Oral Cavity - Dentition: Adequate natural dentition Orofacial Anatomy: WFL Anatomy: Anatomy: WFL Boluses Administered: Boluses Administered Boluses Administered: Thin liquids (Level 0); Mildly thick liquids (Level 2, nectar thick); Puree; Solid  Oral Impairment Domain: Oral Impairment Domain Lip Closure: No labial escape Tongue control during bolus hold: Cohesive bolus between tongue to palatal seal Bolus preparation/mastication: Slow prolonged chewing/mashing with complete recollection Bolus transport/lingual motion: Brisk tongue motion Oral residue: Trace residue lining oral structures Location of oral residue : Floor of mouth; Tongue Initiation of pharyngeal swallow : Posterior laryngeal surface of the epiglottis  Pharyngeal Impairment Domain: Pharyngeal Impairment Domain Soft palate elevation: No bolus between soft palate (SP)/pharyngeal wall (PW) Laryngeal elevation: Complete superior movement of thyroid cartilage with complete approximation of arytenoids to epiglottic petiole Anterior hyoid excursion: Complete anterior movement Epiglottic movement: Complete inversion Laryngeal vestibule closure: Complete, no air/contrast in laryngeal vestibule Pharyngeal stripping wave : Present - complete Pharyngeal contraction (A/P view only): N/A Pharyngoesophageal segment opening: Complete distension and complete duration, no obstruction of flow Tongue base retraction: Trace column of contrast or air between tongue base and PPW Pharyngeal residue: Trace residue within or on pharyngeal structures Location of pharyngeal residue: Valleculae  Esophageal Impairment Domain: Esophageal Impairment Domain Esophageal clearance upright position: Complete clearance, esophageal coating Pill: Pill Consistency administered: Thin liquids (Level 0) Penetration/Aspiration Scale Score: Penetration/Aspiration Scale Score 1.  Material does not enter airway: Mildly thick  liquids (Level 2, nectar thick); Puree; Solid; Pill 2.  Material enters airway, remains ABOVE vocal cords then ejected out: Thin liquids (Level 0) Compensatory Strategies: No data recorded  General Information: Caregiver present: Yes  Diet Prior to this Study: Regular; Thin liquids (Level 0)   Temperature : Normal   Respiratory Status: WFL   Supplemental O2: None (Room air)   History of Recent Intubation: No  Behavior/Cognition: Alert; Cooperative; Pleasant mood Self-Feeding Abilities: Able to self-feed Baseline vocal quality/speech: Normal Volitional Cough: Able to elicit Volitional Swallow: Able to elicit Exam Limitations: No limitations Goal Planning: No data recorded No data recorded No data recorded No data recorded No data recorded Pain: Pain Assessment Pain Assessment: No/denies pain End of Session: Start Time:SLP Start Time (ACUTE ONLY): 1116 Stop Time: SLP Stop  Time (ACUTE ONLY): 1144 Time Calculation:SLP Time Calculation (min) (ACUTE ONLY): 28 min Charges: SLP Evaluations $ SLP Speech Visit: 1 Visit SLP Evaluations $Outpatient MBS Swallow: 1 Procedure SLP visit diagnosis: SLP Visit Diagnosis: Dysphagia, pharyngeal phase (R13.13) Past Medical History: Past Medical History: Diagnosis Date  Allergy   Dizziness 02/21/2018  Estrogen deficiency 01/13/2018  Need for prophylactic vaccination against Streptococcus pneumoniae (pneumococcus) 01/13/2018  Normocytic anemia 04/11/2024  Right knee pain 05/29/2019  Routine general medical examination at a health care facility 01/13/2018  Seasonal allergies 06/21/2020  Vaginal itching 06/21/2020  Vision changes 01/13/2018 Past Surgical History: Past Surgical History: Procedure Laterality Date  DG THUMB RIGHT HAND ([facility] HX) Right   EYE SURGERY    IR GASTROSTOMY TUBE MOD SED  09/05/2022  IR GASTROSTOMY TUBE REMOVAL  12/04/2022  TUBAL LIGATION   Vona Palma Laurice 06/03/2024, 4:03 PM Amanda L. Vona, MA CCC/SLP Clinical Specialist - Acute Care SLP Acute Rehabilitation Services  Office number [phone] CLINICAL DATA:  Patient with history of pharyngeal dysphagia presents to assess improvement of swallowing. Modified barium swallow requested. EXAM: MODIFIED BARIUM SWALLOW TECHNIQUE: Different consistencies of barium were administered orally to the patient by the Speech Pathologist. Imaging of the pharynx was performed in the lateral projection. Kacie Matthews PA-C was present in the fluoroscopy room during this study, which was supervised and interpreted by Dr. Lynwood Seip. Radiologist, not in attendance for the exam. FLUOROSCOPY: Radiation Exposure Index (as provided by the fluoroscopic device): 13.4 mGy Kerma COMPARISON:  None Available. FINDINGS: Vestibular Penetration: Multiple episodes of penetration with thin liquids but no aspiration seen. Other:  None. IMPRESSION: Persistent penetration with thin liquids, but no aspiration observed. Please refer to the Speech Pathologists report for complete details and recommendations. Electronically Signed   By: Lynwood Seip Raddle M.D.   On: 06/03/2024 12:55    TODAY-DAY OF DISCHARGE:  Subjective:   Michiko Lineman today has no headache,no chest abdominal pain,no new weakness tingling or numbness, feels much better wants to go home today.  Objective:   Blood pressure 130/65, pulse 100, temperature 98.3 F (36.8 C), temperature source Oral, resp. rate 18, height 5' 1 (1.549 m), weight 48.3 kg, SpO2 97%.  Intake/Output Summary (Last 24 hours) at 06/17/2024 1321 Last data filed at 06/17/2024 0400 Gross per 24 hour  Intake 2370.12 ml  Output 2500 ml  Net -129.88 ml   Filed Weights   06/14/24 1358 06/16/24 0500  Weight: 41.3 kg 48.3 kg    Exam: Awake Alert, Oriented *3, No new F.N deficits, Normal affect [place].AT,PERRAL Supple Neck,No JVD, No cervical lymphadenopathy appriciated.  Symmetrical Chest wall movement, Good air movement bilaterally, CTAB RRR,No Gallops,Rubs or new Murmurs, No Parasternal Heave +ve B.Sounds, Abd  Soft, Non tender, No organomegaly appriciated, No rebound -guarding or rigidity. No Cyanosis, Clubbing or edema, No new Rash or bruise   PERTINENT RADIOLOGIC STUDIES: ECHOCARDIOGRAM COMPLETE Result Date: 06/15/2024    ECHOCARDIOGRAM REPORT   Patient Name:   KAILANA BENNINGER Date of Exam: 06/15/2024 Medical Rec #:  [id number]         Height:       61.0 in Accession #:    [id number]        Weight:       91.0 lb Date of Birth:  Sep 20, 1941         BSA:          1.352 m Patient Age:    39 years  BP:           152/59 mmHg Patient Gender: F                 HR:           111 bpm. Exam Location:  Inpatient Procedure: 2D Echo, Color Doppler and Cardiac Doppler (Both Spectral and Color            Flow Doppler were utilized during procedure). Indications:    Stroke  History:        Patient has prior history of Echocardiogram examinations, most                 recent 04/11/2024. Risk Factors:Diabetes.  Sonographer:    Benard Stallion Referring Phys: [id number] VISHAL R PATEL IMPRESSIONS  1. Left ventricular ejection fraction, by estimation, is 65 to 70%. The left ventricle has normal function. The left ventricle has no regional wall motion abnormalities. Left ventricular diastolic parameters are consistent with Grade I diastolic dysfunction (impaired relaxation).  2. Right ventricular systolic function is normal. The right ventricular size is normal. There is mildly elevated pulmonary artery systolic pressure. The estimated right ventricular systolic pressure is 37.1 mmHg.  3. The mitral valve is normal in structure. No evidence of mitral valve regurgitation. No evidence of mitral stenosis.  4. The aortic valve is tricuspid. There is mild calcification of the aortic valve. Aortic valve regurgitation is not visualized. No aortic stenosis is present.  5. The inferior vena cava is normal in size with greater than 50% respiratory variability, suggesting right atrial pressure of 3 mmHg. FINDINGS  Left Ventricle: Left  ventricular ejection fraction, by estimation, is 65 to 70%. The left ventricle has normal function. The left ventricle has no regional wall motion abnormalities. The left ventricular internal cavity size was normal in size. There is  no left ventricular hypertrophy. Left ventricular diastolic parameters are consistent with Grade I diastolic dysfunction (impaired relaxation). Right Ventricle: The right ventricular size is normal. No increase in right ventricular wall thickness. Right ventricular systolic function is normal. There is mildly elevated pulmonary artery systolic pressure. The tricuspid regurgitant velocity is 2.92  m/s, and with an assumed right atrial pressure of 3 mmHg, the estimated right ventricular systolic pressure is 37.1 mmHg. Left Atrium: Left atrial size was normal in size. Right Atrium: Right atrial size was normal in size. Pericardium: There is no evidence of pericardial effusion. Mitral Valve: The mitral valve is normal in structure. Mild mitral annular calcification. No evidence of mitral valve regurgitation. No evidence of mitral valve stenosis. MV peak gradient, 8.9 mmHg. The mean mitral valve gradient is 4.0 mmHg. Tricuspid Valve: The tricuspid valve is normal in structure. Tricuspid valve regurgitation is trivial. Aortic Valve: The aortic valve is tricuspid. There is mild calcification of the aortic valve. Aortic valve regurgitation is not visualized. No aortic stenosis is present. Aortic valve mean gradient measures 5.5 mmHg. Aortic valve peak gradient measures 9.7 mmHg. Aortic valve area, by VTI measures 2.09 cm. Pulmonic Valve: The pulmonic valve was normal in structure. Pulmonic valve regurgitation is not visualized. Aorta: The aortic root is normal in size and structure. Venous: The inferior vena cava is normal in size with greater than 50% respiratory variability, suggesting right atrial pressure of 3 mmHg. IAS/Shunts: No atrial level shunt detected by color flow Doppler.  LEFT  VENTRICLE PLAX 2D LVIDd:         3.10 cm   Diastology LVIDs:  1.90 cm   LV e' medial:    8.27 cm/s LV PW:         0.90 cm   LV E/e' medial:  9.9 LV IVS:        0.90 cm   LV e' lateral:   7.07 cm/s LVOT diam:     1.70 cm   LV E/e' lateral: 11.6 LV SV:         48 LV SV Index:   35 LVOT Area:     2.27 cm  RIGHT VENTRICLE RV Basal diam:  3.30 cm RV Mid diam:    2.60 cm RV S prime:     19.00 cm/s TAPSE (M-mode): 2.0 cm LEFT ATRIUM             Index        RIGHT ATRIUM          Index LA diam:        2.50 cm 1.85 cm/m   RA Area:     8.01 cm LA Vol (A2C):   25.2 ml 18.65 ml/m  RA Volume:   14.80 ml 10.95 ml/m LA Vol (A4C):   32.6 ml 24.12 ml/m LA Biplane Vol: 31.4 ml 23.23 ml/m  AORTIC VALVE AV Area (Vmax):    2.05 cm AV Area (Vmean):   1.96 cm AV Area (VTI):     2.09 cm AV Vmax:           156.00 cm/s AV Vmean:          109.000 cm/s AV VTI:            0.229 m AV Peak Grad:      9.7 mmHg AV Mean Grad:      5.5 mmHg LVOT Vmax:         141.00 cm/s LVOT Vmean:        94.000 cm/s LVOT VTI:          0.211 m LVOT/AV VTI ratio: 0.92  AORTA Ao Root diam: 2.40 cm MITRAL VALVE                TRICUSPID VALVE MV Area (PHT): 6.54 cm     TR Peak grad:   34.1 mmHg MV Area VTI:   2.13 cm     TR Vmax:        292.00 cm/s MV Peak grad:  8.9 mmHg MV Mean grad:  4.0 mmHg     SHUNTS MV Vmax:       1.49 m/s     Systemic VTI:  0.21 m MV Vmean:      90.2 cm/s    Systemic Diam: 1.70 cm MV Decel Time: 116 msec MV E velocity: 81.90 cm/s MV A velocity: 162.00 cm/s MV E/A ratio:  0.51 Dalton McleanMD Electronically signed by Ezra Kanner Signature Date/Time: 06/15/2024/8:28:47 PM    Final      PERTINENT LAB RESULTS: CBC: Recent Labs    06/16/24 0545 06/17/24 0426  WBC 18.0* 15.1*  HGB 9.7* 8.9*  HCT 26.2* 24.5*  PLT 223 224   CMET CMP     Component Value Date/Time   NA 134 (L) 06/17/2024 0426   NA 142 11/28/2022 1130   K 3.5 06/17/2024 0426   CL 92 (L) 06/17/2024 0426   CO2 25 06/17/2024 0426   GLUCOSE 304 (H)  06/17/2024 0426   BUN 68 (H) 06/17/2024 0426   BUN 12 11/28/2022 1130   CREATININE 5.52 (H) 06/17/2024 0426   CALCIUM  8.2 (  L) 06/17/2024 0426   PROT 8.2 (H) 06/14/2024 1620   PROT 7.2 01/13/2018 1433   ALBUMIN 1.7 (L) 06/17/2024 0426   ALBUMIN 4.2 01/13/2018 1433   AST 52 (H) 06/14/2024 1620   ALT 31 06/14/2024 1620   ALKPHOS 165 (H) 06/14/2024 1620   BILITOT 1.1 06/14/2024 1620   BILITOT 0.5 01/13/2018 1433   EGFR 57 (L) 11/28/2022 1130   GFRNONAA 7 (L) 06/17/2024 0426    GFR Estimated Creatinine Clearance: 5.8 mL/min (A) (by C-G formula based on SCr of 5.52 mg/dL (H)). No results for input(s): LIPASE, AMYLASE in the last 72 hours. No results for input(s): CKTOTAL, CKMB, CKMBINDEX, TROPONINI in the last 72 hours. Invalid input(s): POCBNP No results for input(s): DDIMER in the last 72 hours. Recent Labs    06/15/24 0605  HGBA1C 6.6*   Recent Labs    06/15/24 0604  CHOL 60  HDL <10*  LDLCALC NOT CALCULATED  TRIG 69  CHOLHDL NOT CALCULATED   No results for input(s): TSH, T4TOTAL, T3FREE, THYROIDAB in the last 72 hours.  Invalid input(s): FREET3 No results for input(s): VITAMINB12, FOLATE, FERRITIN, TIBC, IRON, RETICCTPCT in the last 72 hours. Coags: Recent Labs    06/14/24 1619  INR 1.3*   Microbiology: Recent Results (from the past 240 hours)  Blood Culture (routine x 2)     Status: Abnormal   Collection Time: 06/14/24  4:19 PM   Specimen: BLOOD RIGHT FOREARM  Result Value Ref Range Status   Specimen Description   Final    BLOOD RIGHT FOREARM Performed at [facility], [address], [place], [place] [address]    Special Requests   Final    BOTTLES DRAWN AEROBIC AND ANAEROBIC Blood Culture adequate volume Performed at [facility], [address] [place], [place] [address]    Culture  Setup Time   Final    GRAM NEGATIVE RODS IN BOTH AEROBIC AND ANAEROBIC BOTTLES CRITICAL VALUE NOTED.   VALUE IS CONSISTENT WITH PREVIOUSLY REPORTED AND CALLED VALUE.    Culture (A)  Final    KLEBSIELLA PNEUMONIAE SUSCEPTIBILITIES PERFORMED ON PREVIOUS CULTURE WITHIN THE LAST 5 DAYS. Performed at [facility], [address], [place], [place] [address]    Report Status 06/17/2024 FINAL  Final  Blood Culture (routine x 2)     Status: Abnormal   Collection Time: 06/14/24  4:49 PM   Specimen: Left Antecubital; Blood  Result Value Ref Range Status   Specimen Description LEFT ANTECUBITAL BLOOD  Final   Special Requests   Final    Blood Culture adequate volume BOTTLES DRAWN AEROBIC ONLY   Culture  Setup Time   Final    GRAM NEGATIVE RODS AEROBIC BOTTLE ONLY CRITICAL RESULT CALLED TO, READ BACK BY AND VERIFIED WITH: PHARMD J.LEGGE AT 0830 ON 06/15/2024 BY T.SAAD. Performed at [facility], [address] [place], [place] [address]    Culture KLEBSIELLA PNEUMONIAE (A)  Final   Report Status 06/17/2024 FINAL  Final   Organism ID, Bacteria KLEBSIELLA PNEUMONIAE  Final      Susceptibility   Klebsiella pneumoniae - MIC*    AMPICILLIN  RESISTANT Resistant     CEFAZOLIN  (NON-URINE) 2 SENSITIVE Sensitive     CEFEPIME  <=0.12 SENSITIVE Sensitive     ERTAPENEM <=0.12 SENSITIVE Sensitive     CEFTRIAXONE  <=0.25 SENSITIVE Sensitive     CIPROFLOXACIN <=0.06 SENSITIVE Sensitive     GENTAMICIN <=1 SENSITIVE Sensitive     MEROPENEM <=  0.25 SENSITIVE Sensitive     TRIMETH/SULFA <=20 SENSITIVE Sensitive     AMPICILLIN /SULBACTAM <=2 SENSITIVE Sensitive     PIP/TAZO Value in next row Sensitive ug/mL     <=4 SENSITIVEThis is a modified FDA-approved test that has been validated and its performance characteristics determined by the reporting laboratory.  This laboratory is certified under the Clinical Laboratory Improvement Amendments CLIA as qualified to perform high complexity clinical laboratory testing.    * KLEBSIELLA PNEUMONIAE  Blood Culture ID Panel (Reflexed)     Status: Abnormal    Collection Time: 06/14/24  4:49 PM  Result Value Ref Range Status   Enterococcus faecalis NOT DETECTED NOT DETECTED Final   Enterococcus Faecium NOT DETECTED NOT DETECTED Final   Listeria monocytogenes NOT DETECTED NOT DETECTED Final   Staphylococcus species NOT DETECTED NOT DETECTED Final   Staphylococcus aureus (BCID) NOT DETECTED NOT DETECTED Final   Staphylococcus epidermidis NOT DETECTED NOT DETECTED Final   Staphylococcus lugdunensis NOT DETECTED NOT DETECTED Final   Streptococcus species NOT DETECTED NOT DETECTED Final   Streptococcus agalactiae NOT DETECTED NOT DETECTED Final   Streptococcus pneumoniae NOT DETECTED NOT DETECTED Final   Streptococcus pyogenes NOT DETECTED NOT DETECTED Final   A.calcoaceticus-baumannii NOT DETECTED NOT DETECTED Final   Bacteroides fragilis NOT DETECTED NOT DETECTED Final   Enterobacterales DETECTED (A) NOT DETECTED Final    Comment: Enterobacterales represent a large order of gram negative bacteria, not a single organism. CRITICAL RESULT CALLED TO, READ BACK BY AND VERIFIED WITH: PHARMD J.LEGGE AT 0830 ON 06/15/2024 BY T.SAAD.    Enterobacter cloacae complex NOT DETECTED NOT DETECTED Final   Escherichia coli NOT DETECTED NOT DETECTED Final   Klebsiella aerogenes NOT DETECTED NOT DETECTED Final   Klebsiella oxytoca NOT DETECTED NOT DETECTED Final   Klebsiella pneumoniae DETECTED (A) NOT DETECTED Final    Comment: CRITICAL RESULT CALLED TO, READ BACK BY AND VERIFIED WITH: PHARMD J.LEGGE AT 0830 ON 06/15/2024 BY T.SAAD.    Proteus species NOT DETECTED NOT DETECTED Final   Salmonella species NOT DETECTED NOT DETECTED Final   Serratia marcescens NOT DETECTED NOT DETECTED Final   Haemophilus influenzae NOT DETECTED NOT DETECTED Final   Neisseria meningitidis NOT DETECTED NOT DETECTED Final   Pseudomonas aeruginosa NOT DETECTED NOT DETECTED Final   Stenotrophomonas maltophilia NOT DETECTED NOT DETECTED Final   Candida albicans NOT DETECTED NOT  DETECTED Final   Candida auris NOT DETECTED NOT DETECTED Final   Candida glabrata NOT DETECTED NOT DETECTED Final   Candida krusei NOT DETECTED NOT DETECTED Final   Candida parapsilosis NOT DETECTED NOT DETECTED Final   Candida tropicalis NOT DETECTED NOT DETECTED Final   Cryptococcus neoformans/gattii NOT DETECTED NOT DETECTED Final   CTX-M ESBL NOT DETECTED NOT DETECTED Final   Carbapenem resistance IMP NOT DETECTED NOT DETECTED Final   Carbapenem resistance KPC NOT DETECTED NOT DETECTED Final   Carbapenem resistance NDM NOT DETECTED NOT DETECTED Final   Carbapenem resist OXA 48 LIKE NOT DETECTED NOT DETECTED Final   Carbapenem resistance VIM NOT DETECTED NOT DETECTED Final    Comment: Performed at [facility], [address] [place], [place] [address]  Urine Culture     Status: Abnormal   Collection Time: 06/14/24  5:26 PM   Specimen: Urine, Random  Result Value Ref Range Status   Specimen Description   Final    URINE, RANDOM Performed at [facility], [address] [place], [place] [address]    Special  Requests   Final    NONE Reflexed from [id number] Performed at [facility], [address], [place], [place] [address]    Culture >=100,000 COLONIES/mL KLEBSIELLA PNEUMONIAE (A)  Final   Report Status 06/17/2024 FINAL  Final   Organism ID, Bacteria KLEBSIELLA PNEUMONIAE (A)  Final      Susceptibility   Klebsiella pneumoniae - MIC*    AMPICILLIN  RESISTANT Resistant     CEFAZOLIN  (URINE) Value in next row Sensitive      2 SENSITIVEThis is a modified FDA-approved test that has been validated and its performance characteristics determined by the reporting laboratory.  This laboratory is certified under the Clinical Laboratory Improvement Amendments CLIA as qualified to perform high complexity clinical laboratory testing.    CEFEPIME  Value in next row Sensitive      2 SENSITIVEThis is a modified FDA-approved test that has been validated and  its performance characteristics determined by the reporting laboratory.  This laboratory is certified under the Clinical Laboratory Improvement Amendments CLIA as qualified to perform high complexity clinical laboratory testing.    ERTAPENEM Value in next row Sensitive      2 SENSITIVEThis is a modified FDA-approved test that has been validated and its performance characteristics determined by the reporting laboratory.  This laboratory is certified under the Clinical Laboratory Improvement Amendments CLIA as qualified to perform high complexity clinical laboratory testing.    CEFTRIAXONE  Value in next row Sensitive      2 SENSITIVEThis is a modified FDA-approved test that has been validated and its performance characteristics determined by the reporting laboratory.  This laboratory is certified under the Clinical Laboratory Improvement Amendments CLIA as qualified to perform high complexity clinical laboratory testing.    CIPROFLOXACIN Value in next row Sensitive      2 SENSITIVEThis is a modified FDA-approved test that has been validated and its performance characteristics determined by the reporting laboratory.  This laboratory is certified under the Clinical Laboratory Improvement Amendments CLIA as qualified to perform high complexity clinical laboratory testing.    GENTAMICIN Value in next row Sensitive      2 SENSITIVEThis is a modified FDA-approved test that has been validated and its performance characteristics determined by the reporting laboratory.  This laboratory is certified under the Clinical Laboratory Improvement Amendments CLIA as qualified to perform high complexity clinical laboratory testing.    NITROFURANTOIN Value in next row Sensitive      2 SENSITIVEThis is a modified FDA-approved test that has been validated and its performance characteristics determined by the reporting laboratory.  This laboratory is certified under the Clinical Laboratory Improvement Amendments CLIA as qualified  to perform high complexity clinical laboratory testing.    TRIMETH/SULFA Value in next row Sensitive      2 SENSITIVEThis is a modified FDA-approved test that has been validated and its performance characteristics determined by the reporting laboratory.  This laboratory is certified under the Clinical Laboratory Improvement Amendments CLIA as qualified to perform high complexity clinical laboratory testing.    AMPICILLIN /SULBACTAM Value in next row Sensitive      2 SENSITIVEThis is a modified FDA-approved test that has been validated and its performance characteristics determined by the reporting laboratory.  This laboratory is certified under the Clinical Laboratory Improvement Amendments CLIA as qualified to perform high complexity clinical laboratory testing.    PIP/TAZO Value in next row Sensitive ug/mL     <=4 SENSITIVEThis is a modified FDA-approved test that has been validated and its performance characteristics determined  by the reporting laboratory.  This laboratory is certified under the Clinical Laboratory Improvement Amendments CLIA as qualified to perform high complexity clinical laboratory testing.    MEROPENEM Value in next row Sensitive      <=4 SENSITIVEThis is a modified FDA-approved test that has been validated and its performance characteristics determined by the reporting laboratory.  This laboratory is certified under the Clinical Laboratory Improvement Amendments CLIA as qualified to perform high complexity clinical laboratory testing.    * >=100,000 COLONIES/mL KLEBSIELLA PNEUMONIAE    FURTHER DISCHARGE INSTRUCTIONS:  Get Medicines reviewed and adjusted: Please take all your medications with you for your next visit with your Primary MD  Laboratory/radiological data: Please request your Primary MD to go over all hospital tests and procedure/radiological results at the follow up, please ask your Primary MD to get all Hospital records sent to his/her office.  In some cases,  they will be blood work, cultures and biopsy results pending at the time of your discharge. Please request that your primary care M.D. goes through all the records of your hospital data and follows up on these results.  Also Note the following: If you experience worsening of your admission symptoms, develop shortness of breath, life threatening emergency, suicidal or homicidal thoughts you must seek medical attention immediately by calling 911 or calling your MD immediately  if symptoms less severe.  You must read complete instructions/literature along with all the possible adverse reactions/side effects for all the Medicines you take and that have been prescribed to you. Take any new Medicines after you have completely understood and accpet all the possible adverse reactions/side effects.   Do not drive when taking Pain medications or sleeping medications (Benzodaizepines)  Do not take more than prescribed Pain, Sleep and Anxiety Medications. It is not advisable to combine anxiety,sleep and pain medications without talking with your primary care practitioner  Special Instructions: If you have smoked or chewed Tobacco  in the last 2 yrs please stop smoking, stop any regular Alcohol   and or any Recreational drug use.  Wear Seat belts while driving.  Please note: You were cared for by a hospitalist during your hospital stay. Once you are discharged, your primary care physician will handle any further medical issues. Please note that NO REFILLS for any discharge medications will be authorized once you are discharged, as it is imperative that you return to your primary care physician (or establish a relationship with a primary care physician if you do not have one) for your post hospital discharge needs so that they can reassess your need for medications and monitor your lab values.  Total Time spent coordinating discharge including counseling, education and face to face time equals greater than 30  minutes.  Signed: Poppy Mcafee 06/17/2024 1:21 PM

## 2024-06-18 DIAGNOSIS — I639 Cerebral infarction, unspecified: Secondary | ICD-10-CM | POA: Diagnosis not present

## 2024-06-18 LAB — CBC WITH DIFFERENTIAL/PLATELET
Abs Immature Granulocytes: 0.11 K/uL — ABNORMAL HIGH (ref 0.00–0.07)
Basophils Absolute: 0 K/uL (ref 0.0–0.1)
Basophils Relative: 0 %
Eosinophils Absolute: 0.4 K/uL (ref 0.0–0.5)
Eosinophils Relative: 3 %
HCT: 24.5 % — ABNORMAL LOW (ref 36.0–46.0)
Hemoglobin: 8.9 g/dL — ABNORMAL LOW (ref 12.0–15.0)
Immature Granulocytes: 1 %
Lymphocytes Relative: 24 %
Lymphs Abs: 3.5 K/uL (ref 0.7–4.0)
MCH: 26.3 pg (ref 26.0–34.0)
MCHC: 36.3 g/dL — ABNORMAL HIGH (ref 30.0–36.0)
MCV: 72.3 fL — ABNORMAL LOW (ref 80.0–100.0)
Monocytes Absolute: 1.1 K/uL — ABNORMAL HIGH (ref 0.1–1.0)
Monocytes Relative: 8 %
Neutro Abs: 9.3 K/uL — ABNORMAL HIGH (ref 1.7–7.7)
Neutrophils Relative %: 64 %
Platelets: 240 K/uL (ref 150–400)
RBC: 3.39 MIL/uL — ABNORMAL LOW (ref 3.87–5.11)
RDW: 14.3 % (ref 11.5–15.5)
WBC: 14.4 K/uL — ABNORMAL HIGH (ref 4.0–10.5)
nRBC: 0 % (ref 0.0–0.2)

## 2024-06-18 LAB — COMPREHENSIVE METABOLIC PANEL WITH GFR
ALT: 29 U/L (ref 0–44)
AST: 33 U/L (ref 15–41)
Albumin: 1.8 g/dL — ABNORMAL LOW (ref 3.5–5.0)
Alkaline Phosphatase: 216 U/L — ABNORMAL HIGH (ref 38–126)
Anion gap: 17 — ABNORMAL HIGH (ref 5–15)
BUN: 59 mg/dL — ABNORMAL HIGH (ref 8–23)
CO2: 26 mmol/L (ref 22–32)
Calcium: 8.7 mg/dL — ABNORMAL LOW (ref 8.9–10.3)
Chloride: 97 mmol/L — ABNORMAL LOW (ref 98–111)
Creatinine, Ser: 4.83 mg/dL — ABNORMAL HIGH (ref 0.44–1.00)
GFR, Estimated: 8 mL/min — ABNORMAL LOW (ref >60)
Glucose, Bld: 78 mg/dL (ref 70–99)
Potassium: 3.4 mmol/L — ABNORMAL LOW (ref 3.5–5.1)
Sodium: 140 mmol/L (ref 135–145)
Total Bilirubin: 1.1 mg/dL (ref 0.0–1.2)
Total Protein: 5.7 g/dL — ABNORMAL LOW (ref 6.5–8.1)

## 2024-06-18 LAB — GLUCOSE, CAPILLARY
Glucose-Capillary: 75 mg/dL (ref 70–99)
Glucose-Capillary: 84 mg/dL (ref 70–99)
Glucose-Capillary: 181 mg/dL — ABNORMAL HIGH (ref 70–99)
Glucose-Capillary: 185 mg/dL — ABNORMAL HIGH (ref 70–99)

## 2024-06-18 LAB — GAMMA GT: GGT: 79 U/L — ABNORMAL HIGH (ref 7–50)

## 2024-06-18 MED ORDER — CEPHALEXIN 250 MG PO CAPS: 250.0000 mg | ORAL_CAPSULE | ORAL | Status: DC

## 2024-06-18 MED ORDER — MECLIZINE HCL 25 MG PO TABS: 12.5000 mg | ORAL_TABLET | Freq: Three times a day (TID) | ORAL | Status: DC | PRN

## 2024-06-18 MED ORDER — ONDANSETRON HCL 4 MG PO TABS
4.0000 mg | ORAL_TABLET | Freq: Three times a day (TID) | ORAL | Status: DC
  Administered 2024-06-18 – 2024-06-30 (×34): 4 mg via ORAL
  Filled 2024-06-18 (×35): qty 1

## 2024-06-18 MED ORDER — ASPIRIN 81 MG PO CHEW
81.0000 mg | CHEWABLE_TABLET | Freq: Every day | ORAL | Status: DC
  Administered 2024-06-19 – 2024-06-30 (×12): 81 mg via ORAL
  Filled 2024-06-18 (×12): qty 1

## 2024-06-18 MED ORDER — POTASSIUM CHLORIDE 20 MEQ PO PACK
20.0000 meq | PACK | Freq: Every day | ORAL | Status: AC
  Administered 2024-06-18 – 2024-06-20 (×3): 20 meq via ORAL
  Filled 2024-06-18 (×3): qty 1

## 2024-06-18 MED ORDER — SODIUM CHLORIDE 0.45 % IV SOLN: INTRAVENOUS | Status: AC

## 2024-06-18 MED ORDER — SILODOSIN 8 MG PO CAPS
8.0000 mg | ORAL_CAPSULE | Freq: Every day | ORAL | Status: DC
  Administered 2024-06-19 – 2024-06-20 (×2): 8 mg via ORAL
  Filled 2024-06-18 (×2): qty 1

## 2024-06-18 NOTE — Progress Notes (Signed)
 Patient ID: Sara [name], female   DOB: [birth year], 83 y.o.   MRN: [id number]  [phone]-- SW spoke with pt dtr Sara [name] to introduce self, explain role, discuss discharge process. SW discussed discharge plan. She reports their mother lives with her sister Sara [name]. States her sister Sara [name] works out of the home, and she works from home; their brother Sara [name] also works out of the home as well. Hopes that she lives about 7 minutes from the home and able to check in intermittently since she has more flexibility with her job. SW shared will follow-up with updates as available.   Graeme Jude, MSW, LCSW Office: [phone] Cell: [phone] Fax: [phone]

## 2024-06-18 NOTE — Plan of Care (Signed)
  Problem: RH Swallowing Goal: LTG Patient will consume least restrictive diet using compensatory strategies with assistance (SLP) Description: LTG:  Patient will consume least restrictive diet using compensatory strategies with assistance (SLP) Flowsheets (Taken 06/18/2024 1647) LTG: Pt Patient will consume least restrictive diet using compensatory strategies with assistance of (SLP): Supervision   Problem: RH Comprehension Communication Goal: LTG Patient will comprehend basic/complex auditory (SLP) Description: LTG: Patient will comprehend basic/complex auditory information with cues (SLP). Flowsheets (Taken 06/18/2024 1647) LTG: Patient will comprehend:  Complex auditory information  Basic auditory information LTG: Patient will comprehend auditory information with cueing (SLP): Supervision Note: Basic and mildly complex   Problem: RH Expression Communication Goal: LTG Patient will increase word finding of common (SLP) Description: LTG:  Patient will increase word finding of common objects/daily info/abstract thoughts with cues using compensatory strategies (SLP). Flowsheets (Taken 06/18/2024 1647) LTG: Patient will increase word finding of common (SLP): Supervision Patient will use compensatory strategies to increase word finding of:  Daily info  Common objects Note: And mildly complex items   Problem: RH Problem Solving Goal: LTG Patient will demonstrate problem solving for (SLP) Description: LTG:  Patient will demonstrate problem solving for basic/complex daily situations with cues  (SLP) Flowsheets (Taken 06/18/2024 1647) LTG: Patient will demonstrate problem solving for (SLP): Basic daily situations LTG Patient will demonstrate problem solving for: Supervision   Problem: RH Memory Goal: LTG Patient will use memory compensatory aids to (SLP) Description: LTG:  Patient will use memory compensatory aids to recall biographical/new, daily complex information with cues  (SLP) Flowsheets (Taken 06/18/2024 1647) LTG: Patient will use memory compensatory aids to (SLP): Supervision Note: Use compensatory memory aids as needed to recall recent/relevant info, including education   Problem: RH Attention Goal: LTG Patient will demonstrate this level of attention during functional activites (SLP) Description: LTG:  Patient will will demonstrate this level of attention during functional activites (SLP) Flowsheets (Taken 06/18/2024 1647) Patient will demonstrate during cognitive/linguistic activities the attention type of: Selective Patient will demonstrate this level of attention during cognitive/linguistic activities in: Controlled LTG: Patient will demonstrate this level of attention during cognitive/linguistic activities with assistance of (SLP): Supervision Number of minutes patient will demonstrate attention during cognitive/linguistic activities: 15

## 2024-06-18 NOTE — Progress Notes (Signed)
 Inpatient Rehabilitation Care Coordinator Assessment and Plan Patient Details  Name: Sara [name] MRN: [id number] Date of Birth: 06-10-1941  Today's Date: 06/18/2024  Hospital Problems: Principal Problem:   CVA (cerebral vascular accident) [facility])  Past Medical History:  Past Medical History:  Diagnosis Date   Allergy    Dizziness 02/21/2018   Estrogen deficiency 01/13/2018   Need for prophylactic vaccination against Streptococcus pneumoniae (pneumococcus) 01/13/2018   Normocytic anemia 04/11/2024   Right knee pain 05/29/2019   Routine general medical examination at a health care facility 01/13/2018   Seasonal allergies 06/21/2020   Vaginal itching 06/21/2020   Vision changes 01/13/2018   Past Surgical History:  Past Surgical History:  Procedure Laterality Date   DG THUMB RIGHT HAND ([facility] HX) Right    EYE SURGERY     IR GASTROSTOMY TUBE MOD SED  09/05/2022   IR GASTROSTOMY TUBE REMOVAL  12/04/2022   TUBAL LIGATION     Social History:  reports that she has never smoked. She has never used smokeless tobacco. She reports that she does not drink alcohol  and does not use drugs.  Family / Support Systems Marital Status: Widow/Widower How Long?: 2015 Patient Roles: Parent Spouse/Significant Other: Widowed Children: 3 children- [place], [name], and Grayce (lives in the home with her). Other children live locally. Other Supports: N/A Anticipated Caregiver: Dtr Grayce; Dtr Hope to help PRN during the day as she works from home and has more flexibility Ability/Limitations of Caregiver: Children all work during the day, primary caregiver works outside of the home during normal business hours. Caregiver Availability: Intermittent Family Dynamics: Pt lives with her dtr Grayce.  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 Education: high school grad Health Literacy - How often do you need to have  someone help you when you read instructions, pamphlets, or other written material from your doctor or pharmacy?: Rarely Writes: Yes Employment Status: Retired Date Retired/Disabled/Unemployed: 2004 Marine scientist Issues: Denies Electronics engineer: N./A   Abuse/Neglect Abuse/Neglect Assessment Can Be Completed: Yes Physical Abuse: Denies Verbal Abuse: Denies Sexual Abuse: Denies Exploitation of patient/patient's resources: Denies Self-Neglect: Denies  Patient response to: Social Isolation - How often do you feel lonely or isolated from those around you?: Never  Emotional Status Pt's affect, behavior and adjustment status: Pt in good spirits at time of visit Recent Psychosocial Issues: Denies Psychiatric History: denies Substance Abuse History: Denise  Patient / Family Perceptions, Expectations & Goals Pt/Family understanding of illness & functional limitations: Pt and family have a general understanding of care needs Premorbid pt/family roles/activities: Independent Anticipated changes in roles/activities/participation: Assistance with ADLs/IADLs Pt/family expectations/goals: Pt goal is to work on getting home so she can get back to cooking, going to the bathroom on her own, walking to the mailbox, and making her bed.  Community Resources [employer]: None Premorbid Home Care/DME Agencies: None Transportation available at discharge: TBD Is the patient able to respond to transportation needs?: Yes In the past 12 months, has lack of transportation kept you from medical appointments or from getting medications?: No In the past 12 months, has lack of transportation kept you from meetings, work, or from getting things needed for daily living?: No Resource referrals recommended: Neuropsychology  Discharge Planning Living Arrangements: Children (daughter) Support Systems: Children Type of Residence: Private residence Community education officer Resources: Media planner  (specify) ([employer] Medicare) Surveyor, quantity Resources: Restaurant manager, fast food Screen Referred: No Living Expenses: Mortgage Money Management: Patient,  Other (Comment) (Dtr Hope makes sure all bills are paid) Does the patient have any problems obtaining your medications?: No Home Management: Pt and dr both help with home care needs. Pt will take out the trash at times and cleans up her own personal space. Patient/Family Preliminary Plans: TBD Care Coordinator Barriers to Discharge: Decreased caregiver support, Lack of/limited family support, Insurance for SNF coverage Care Coordinator Anticipated Follow Up Needs: HH/OP  Clinical Impression SW met with pt, pt children- [place] and [name], and friend in room. Pt is not a Cytogeneticist. No HCPOA. Dme- RW, and rollator- uses the rolaltor at home.   Gerrie Castiglia A Zyanna Leisinger 06/18/2024, 1:57 PM

## 2024-06-18 NOTE — Evaluation (Signed)
 Occupational Therapy Assessment and Plan  Patient Details  Name: Sara [name] MRN: [id number] Date of Birth: Apr 20, 1941  OT Diagnosis: cognitive deficits and muscle weakness (generalized) Rehab Potential: Rehab Potential (ACUTE ONLY): Good ELOS: 7-10 days   Today's Date: 06/18/2024 OT Individual Time: 9197-9084 OT Individual Time Calculation (min): 73 min     Hospital Problem: Principal Problem:   CVA (cerebral vascular accident) [facility])   Past Medical History:  Past Medical History:  Diagnosis Date   Allergy    Dizziness 02/21/2018   Estrogen deficiency 01/13/2018   Need for prophylactic vaccination against Streptococcus pneumoniae (pneumococcus) 01/13/2018   Normocytic anemia 04/11/2024   Right knee pain 05/29/2019   Routine general medical examination at a health care facility 01/13/2018   Seasonal allergies 06/21/2020   Vaginal itching 06/21/2020   Vision changes 01/13/2018   Past Surgical History:  Past Surgical History:  Procedure Laterality Date   DG THUMB RIGHT HAND ([facility] HX) Right    EYE SURGERY     IR GASTROSTOMY TUBE MOD SED  09/05/2022   IR GASTROSTOMY TUBE REMOVAL  12/04/2022   TUBAL LIGATION      Assessment & Plan Clinical Impression: Sara [name] 83 year old female with a past medical history of chronic kidney disease (CKD) stage 3b, type 2 diabetes, hypertension, and hyperlipidemia. The patient presented to the [facility] on 06/15/2024 for gait and balance issues that developed over the past 2-3 days. She reported having to brace herself using walls and furniture in her home due to instability. The patient also noted ongoing issues with dysphagia and poor oral intake, leading to several recent admissions for acute kidney injury (AKI).  ED labs: WBC 18.6, hemoglobin 10.1, platelets 302, sodium 130, potassium 6.4, bicarb 13, BUN 130, creatinine 9.52, serum glucose 323, lactic acid 3.9 > 2.3. Patient was found to have severe sepsis due to Klebsiella UTI.  Nephrology consulted.  Renal ultrasound without any acute abnormalities.  A Foley catheter was placed on admission and nephrology was consulted where she received bicarb infusion, IVFs, and empiric antibiotics for urosepsis.    Additionally MRI of brain showed punctate acute left pontine infarct and moderate chronic small vessel ischemic disease. Neurology was consulted based on findings and felt CVA to be small and likely ischemic in etiology.  Recommendations were to continue aspirin  81 mg daily and delay CT angio until renal function improved. Transthoracic echo revealed normal ejection fraction grade 1 diastolic dysfunction. Per chart review the patient lives at home with her children in a 1 level home with one-step to enter and has been modified independent use of rollator at home.  Patient transferred to CIR on 06/17/2024 .    Patient currently requires min with basic self-care skills secondary to muscle weakness, decreased cardiorespiratoy endurance, impaired timing and sequencing, decreased coordination, and decreased motor planning, decreased motor planning, decreased attention, decreased awareness, decreased problem solving, decreased safety awareness, and decreased memory, central origin, and decreased sitting balance, decreased standing balance, decreased postural control, and decreased balance strategies.  Prior to hospitalization, patient could complete ADLs with modified independent .  Patient will benefit from skilled intervention to decrease level of assist with basic self-care skills, increase independence with basic self-care skills, and increase level of independence with iADL prior to discharge home with care partner.  Anticipate patient will require 24 hour supervision and follow up outpatient.  OT - End of Session Activity Tolerance: Decreased this session Endurance Deficit: Yes OT Assessment Rehab Potential (ACUTE ONLY): Good  OT Barriers to Discharge: None OT Patient demonstrates  impairments in the following area(s): Balance;Perception;Safety;Cognition;Endurance;Motor OT Basic ADL's Functional Problem(s): Bathing;Dressing;Toileting OT Advanced ADL's Functional Problem(s): Laundry OT Transfers Functional Problem(s): Toilet;Tub/Shower OT Additional Impairment(s): None OT Plan OT Intensity: Minimum of 1-2 x/day, 45 to 90 minutes OT Frequency: 5 out of 7 days OT Duration/Estimated Length of Stay: 7-10 days OT Treatment/Interventions: Balance/vestibular training;Discharge planning;Pain management;Self Care/advanced ADL retraining;Therapeutic Activities;UE/LE Coordination activities;Cognitive remediation/compensation;Disease mangement/prevention;Functional mobility training;Patient/family education;Skin care/wound managment;Therapeutic Exercise;Visual/perceptual remediation/compensation;Community reintegration;DME/adaptive equipment instruction;Neuromuscular re-education;Psychosocial support;Splinting/orthotics;UE/LE Strength taining/ROM OT Self Feeding Anticipated Outcome(s): MOD I OT Basic Self-Care Anticipated Outcome(s): SUP OT Toileting Anticipated Outcome(s): SUP OT Bathroom Transfers Anticipated Outcome(s): SUP OT Recommendation Recommendations for Other Services: Therapeutic Recreation consult Therapeutic Recreation Interventions: Pet therapy;Other (comment) (dance) Patient destination: Home Follow Up Recommendations: Outpatient OT Equipment Recommended: To be determined   OT Evaluation Precautions/Restrictions  Precautions Precautions: Fall Restrictions Weight Bearing Restrictions Per Provider Order: No General   Vital Signs Therapy Vitals Temp: 98.8 F (37.1 C) Temp Source: Oral Pulse Rate: 90 Resp: 16 BP: (!) 147/88 Patient Position (if appropriate): Sitting Oxygen Therapy SpO2: 100 % O2 Device: Room Air Pain   Home Living/Prior Functioning Home Living Family/patient expects to be discharged to:: Private residence Living Arrangements:  Children (daughter) Available Help at Discharge: Family Type of Home: House Home Access: Stairs to enter Secretary/administrator of Steps: 1 Home Layout: One level Bathroom Shower/Tub: Engineer, manufacturing systems: Pharmacist, community: Yes  Lives With: Family IADL History Homemaking Responsibilities: Yes Meal Prep Responsibility: Secondary Laundry Responsibility: Primary Cleaning Responsibility: Secondary Bill Paying/Finance Responsibility: No Shopping Responsibility: No Child Care Responsibility: No Current License: No Mode of Transportation: Family Education: [employer] Occupation: Retired Prior Function Level of Independence: Independent with basic ADLs, Independent with homemaking with ambulation, Independent with gait, Requires assistive device for independence  Able to Take Stairs?: Yes Driving: No Vocation: Retired Administrator, sports Baseline Vision/History: 1 Wears glasses Ability to See in Adequate Light: 0 Adequate Patient Visual Report: No change from baseline Vision Assessment?: No apparent visual deficits Perception  Perception: Impaired Praxis Praxis: Impaired Praxis Impairment Details: Motor planning Praxis-Other Comments: Mild Cognition Cognition Overall Cognitive Status: Impaired/Different from baseline (DTR reports some baseline memory deficits, however worse since CVA) Arousal/Alertness: Awake/alert Orientation Level: Person;Place;Situation Person: Oriented Place: Oriented Situation: Oriented Memory: Impaired Memory Impairment: Retrieval deficit;Storage deficit Attention: Selective Selective Attention: Impaired Selective Attention Impairment: Functional basic Awareness: Impaired Awareness Impairment: Emergent impairment Problem Solving: Impaired Problem Solving Impairment: Functional basic Safety/Judgment: Impaired Brief Interview for Mental Status (BIMS) Repetition of Three Words (First Attempt): 3 Temporal Orientation: Year:  Correct Temporal Orientation: Month: Accurate within 5 days Temporal Orientation: Day: Correct Recall: Sock: No, could not recall Recall: Blue: Yes, no cue required Recall: Bed: Yes, no cue required BIMS Summary Score: 13 Sensation Sensation Light Touch: Appears Intact Coordination Gross Motor Movements are Fluid and Coordinated: No Fine Motor Movements are Fluid and Coordinated: No Coordination and Movement Description: Generalized weakness Finger Nose Finger Test: mild slowed on L UE, smooth equal movemtns on R UE Motor  Motor Motor: Other (comment) Motor - Skilled Clinical Observations: Generalized weakness  Trunk/Postural Assessment  Cervical Assessment Cervical Assessment: Within Functional Limits Thoracic Assessment Thoracic Assessment: Within Functional Limits Lumbar Assessment Lumbar Assessment: Within Functional Limits Postural Control Postural Control: Deficits on evaluation (delayed)  Balance Balance Balance Assessed: Yes Static Sitting Balance Static Sitting - Balance Support: Feet supported Static Sitting - Level of Assistance: 5: Stand by assistance Dynamic Sitting Balance Dynamic  Sitting - Balance Support: Feet supported Dynamic Sitting - Level of Assistance: 5: Stand by assistance Static Standing Balance Static Standing - Balance Support: During functional activity Static Standing - Level of Assistance: 4: Min assist Dynamic Standing Balance Dynamic Standing - Balance Support: During functional activity Dynamic Standing - Level of Assistance: 4: Min assist Extremity/Trunk Assessment RUE Assessment RUE Assessment: Within Functional Limits General Strength Comments: decreased strength 2/2 generalized deconditioning 4/5 LUE Assessment LUE Assessment: Within Functional Limits General Strength Comments: decreased strength 2/2 generalized deconditioning 4/5  Care Tool Care Tool Self Care Eating   Eating Assist Level: Set up assist    Oral Care     Oral Care Assist Level: Set up assist    Bathing   Body parts bathed by patient: Right arm;Left arm;Chest;Abdomen;Front perineal area;Right upper leg;Left upper leg;Face;Left lower leg;Right lower leg Body parts bathed by helper: Buttocks   Assist Level: Minimal Assistance - Patient > 75%    Upper Body Dressing(including orthotics)   What is the patient wearing?: Pull over shirt   Assist Level: Minimal Assistance - Patient > 75%    Lower Body Dressing (excluding footwear)   What is the patient wearing?: Pants;Underwear/pull up Assist for lower body dressing: Minimal Assistance - Patient > 75%    Putting on/Taking off footwear   What is the patient wearing?: Non-skid slipper socks Assist for footwear: Moderate Assistance - Patient 50 - 74%       Care Tool Toileting Toileting activity   Assist for toileting: Moderate Assistance - Patient 50 - 74%     Care Tool Bed Mobility Roll left and right activity   Roll left and right assist level: Minimal Assistance - Patient > 75%    Sit to lying activity   Sit to lying assist level: Minimal Assistance - Patient > 75%    Lying to sitting on side of bed activity   Lying to sitting on side of bed assist level: the ability to move from lying on the back to sitting on the side of the bed with no back support.: Minimal Assistance - Patient > 75%     Care Tool Transfers Sit to stand transfer   Sit to stand assist level: Minimal Assistance - Patient > 75%    Chair/bed transfer   Chair/bed transfer assist level: Minimal Assistance - Patient > 75%     Toilet transfer   Assist Level: Minimal Assistance - Patient > 75%     Care Tool Cognition  Expression of Ideas and Wants Expression of Ideas and Wants: 4. Without difficulty (complex and basic) - expresses complex messages without difficulty and with speech that is clear and easy to understand  Understanding Verbal and Non-Verbal Content Understanding Verbal and Non-Verbal Content: 4.  Understands (complex and basic) - clear comprehension without cues or repetitions   Memory/Recall Ability Memory/Recall Ability : That he or she is in a hospital/hospital unit;Current season   Refer to Care Plan for Long Term Goals  SHORT TERM GOAL WEEK 1 OT Short Term Goal 1 (Week 1): STG=LTG d/t ELOS  Recommendations for other services: Therapeutic Recreation  Pet therapy and Other dance   Skilled Therapeutic Intervention Skilled Therapeutic Interventions/Progress Updates:  1:1 OT evaluation and intervention initiated with skilled education provided on OT role, goals, and POC. Pt received sitting up in bed presenting to be in good spirits receptive to skilled OT session reporting 0/10 pain- OT offering intermittent rest breaks, repositioning, and therapeutic support to optimize participation in therapy  session. Pt's DTR [facility]) present for part of therapy evaluation/session. Pt completed ADLs at levels listed below using RW for dynamic standing balance and functional transfers. Pt limited by functional cognition, decreased balance, decreased activity tolerance, and decreased motor planning this session. She would benefit from continued OT services in IPR setting. Pt was left resting in wc with call bell in reach, seatbelt alarm on, and all needs met.   ADL ADL Equipment Provided: None Eating: Set up Where Assessed-Eating: Chair Grooming: Setup Where Assessed-Grooming: Chair;Sitting at sink Upper Body Bathing: Supervision/safety Where Assessed-Upper Body Bathing: Wheelchair Lower Body Bathing: Minimal assistance Where Assessed-Lower Body Bathing: Wheelchair Upper Body Dressing: Minimal assistance Where Assessed-Upper Body Dressing: Wheelchair Lower Body Dressing: Minimal assistance Where Assessed-Lower Body Dressing: Wheelchair Toileting: Minimal assistance Where Assessed-Toileting: Teacher, adult education: Curator Method: Network engineer: Engineer, technical sales: Not assessed Film/video editor: Not assessed Mobility  Bed Mobility Bed Mobility: Supine to Sit;Sit to Supine Supine to Sit: Minimal Assistance - Patient > 75% Sit to Supine: Minimal Assistance - Patient > 75% Transfers Sit to Stand: Minimal Assistance - Patient > 75% Stand to Sit: Minimal Assistance - Patient > 75%   Discharge Criteria: Patient will be discharged from OT if patient refuses treatment 3 consecutive times without medical reason, if treatment goals not met, if there is a change in medical status, if patient makes no progress towards goals or if patient is discharged from hospital.  The above assessment, treatment plan, treatment alternatives and goals were discussed and mutually agreed upon: by patient and by family  Sara [name] 06/18/2024, 12:42 PM

## 2024-06-18 NOTE — Evaluation (Signed)
 Physical Therapy Assessment and Plan  Patient Details  Name: Sara [name] MRN: [id number] Date of Birth: 1941/05/17  PT Diagnosis: Difficulty walking and Muscle weakness Rehab Potential: Good ELOS: 7-10 Days   Today's Date: 06/18/2024 PT Individual Time: 9052-8954 PT Individual Time Calculation (min): 58 min    Hospital Problem: Principal Problem:   CVA (cerebral vascular accident) [facility])   Past Medical History:  Past Medical History:  Diagnosis Date   Allergy    Dizziness 02/21/2018   Estrogen deficiency 01/13/2018   Need for prophylactic vaccination against Streptococcus pneumoniae (pneumococcus) 01/13/2018   Normocytic anemia 04/11/2024   Right knee pain 05/29/2019   Routine general medical examination at a health care facility 01/13/2018   Seasonal allergies 06/21/2020   Vaginal itching 06/21/2020   Vision changes 01/13/2018   Past Surgical History:  Past Surgical History:  Procedure Laterality Date   DG THUMB RIGHT HAND ([facility] HX) Right    EYE SURGERY     IR GASTROSTOMY TUBE MOD SED  09/05/2022   IR GASTROSTOMY TUBE REMOVAL  12/04/2022   TUBAL LIGATION      Assessment & Plan Clinical Impression: Patient is a 83 year old female with a past medical history of chronic kidney disease (CKD) stage 3b, type 2 diabetes, hypertension, and hyperlipidemia. The patient presented to the [facility] on 06/15/2024 for gait and balance issues that developed over the past 2-3 days. She reported having to brace herself using walls and furniture in her home due to instability. The patient also noted ongoing issues with dysphagia and poor oral intake, leading to several recent admissions for acute kidney injury (AKI).  ED labs: WBC 18.6, hemoglobin 10.1, platelets 302, sodium 130, potassium 6.4, bicarb 13, BUN 130, creatinine 9.52, serum glucose 323, lactic acid 3.9 > 2.3. Patient was found to have severe sepsis due to Klebsiella UTI. Nephrology consulted.  Renal ultrasound without any acute  abnormalities.  A Foley catheter was placed on admission and nephrology was consulted where she received bicarb infusion, IVFs, and empiric antibiotics for urosepsis.    Additionally MRI of brain showed punctate acute left pontine infarct and moderate chronic small vessel ischemic disease. Neurology was consulted based on findings and felt CVA to be small and likely ischemic in etiology.  Recommendations were to continue aspirin  81 mg daily and delay CT angio until renal function improved. Transthoracic echo revealed normal ejection fraction grade 1 diastolic dysfunction. Per chart review the patient lives at home with her children in a 1 level home with one-step to enter and has been modified independent use of rollator at home.  She currently transfers sit to stand with min assist and walked 50 feet min assist to CGA.  Patient transferred to CIR on 06/17/2024 .   Patient currently requires min with mobility secondary to muscle weakness, decreased cardiorespiratoy endurance, decreased coordination, decreased motor planning, and decreased standing balance, decreased postural control, and decreased balance strategies.  Prior to hospitalization, patient was modified independent  with mobility and lived with Family in a House home.  Home access is 1Stairs to enter.  Patient will benefit from skilled PT intervention to maximize safe functional mobility, minimize fall risk, and decrease caregiver burden for planned discharge home with intermittent assist.  Anticipate patient will benefit from follow up OP at discharge.  PT - End of Session Activity Tolerance: Tolerates 30+ min activity with multiple rests Endurance Deficit: Yes PT Assessment Rehab Potential (ACUTE/IP ONLY): Good PT Barriers to Discharge: Decreased caregiver support PT Patient  demonstrates impairments in the following area(s): Balance;Endurance;Motor;Perception;Safety PT Transfers Functional Problem(s): Bed Mobility;Bed to  Chair;Car;Furniture PT Locomotion Functional Problem(s): Ambulation;Stairs PT Plan PT Intensity: Minimum of 1-2 x/day ,45 to 90 minutes PT Frequency: 5 out of 7 days PT Duration Estimated Length of Stay: 7-10 Days PT Treatment/Interventions: Ambulation/gait training;Community reintegration;Neuromuscular re-education;DME/adaptive equipment instruction;Psychosocial support;UE/LE Strength taining/ROM;Stair training;Balance/vestibular training;Discharge planning;Pain management;Skin care/wound management;Therapeutic Activities;UE/LE Coordination activities;Cognitive remediation/compensation;Disease management/prevention;Functional mobility training;Patient/family education;Splinting/orthotics;Therapeutic Exercise;Visual/perceptual remediation/compensation PT Transfers Anticipated Outcome(s): Supervision PT Locomotion Anticipated Outcome(s): Supervision PT Recommendation Recommendations for Other Services: Therapeutic Recreation consult;Other (comment) (Dance) Follow Up Recommendations: Outpatient PT Patient destination: Home Equipment Recommended: To be determined   PT Evaluation Precautions/Restrictions Precautions Precautions: Fall Restrictions Weight Bearing Restrictions Per Provider Order: No General Chart Reviewed: Yes Family/Caregiver Present: Yes  Pain Pain Assessment Pain Scale: 0-10 Pain Score: 0-No pain Pain Interference Pain Interference Pain Effect on Sleep: 1. Rarely or not at all Pain Interference with Therapy Activities: 1. Rarely or not at all Pain Interference with Day-to-Day Activities: 1. Rarely or not at all Home Living/Prior Functioning Home Living Available Help at Discharge: Family Type of Home: House Home Access: Stairs to enter Secretary/administrator of Steps: 1 Home Layout: One level Bathroom Shower/Tub: Engineer, manufacturing systems: Standard Bathroom Accessibility: Yes  Lives With: Family Prior Function Level of Independence: Independent with  basic ADLs;Independent with homemaking with ambulation;Independent with gait;Requires assistive device for independence  Able to Take Stairs?: Yes Driving: No Vocation: Retired Optometrist - History Ability to See in Adequate Light: 0 Adequate Perception Perception: Impaired Preception Impairment Details: Spatial orientation Praxis Praxis: Impaired Praxis Impairment Details: Motor planning Praxis-Other Comments: Mild  Cognition Arousal/Alertness: Awake/alert Orientation Level: Oriented X4 Safety/Judgment: Appears intact Sensation Sensation Light Touch: Appears Intact Coordination Gross Motor Movements are Fluid and Coordinated: No Fine Motor Movements are Fluid and Coordinated: No Coordination and Movement Description: Generalized weakness Motor  Motor Motor: Other (comment) Motor - Skilled Clinical Observations: Generalized weakness  Trunk/Postural Assessment  Cervical Assessment Cervical Assessment: Within Functional Limits Thoracic Assessment Thoracic Assessment: Within Functional Limits Lumbar Assessment Lumbar Assessment: Within Functional Limits Postural Control Postural Control: Deficits on evaluation (delayed)  Balance Balance Balance Assessed: Yes Static Sitting Balance Static Sitting - Balance Support: Feet supported Static Sitting - Level of Assistance: 5: Stand by assistance Dynamic Sitting Balance Dynamic Sitting - Balance Support: Feet supported Dynamic Sitting - Level of Assistance: 5: Stand by assistance Static Standing Balance Static Standing - Balance Support: During functional activity Static Standing - Level of Assistance: 4: Min assist Dynamic Standing Balance Dynamic Standing - Balance Support: During functional activity Dynamic Standing - Level of Assistance: 4: Min assist Extremity Assessment  RLE Assessment RLE Assessment: Exceptions to [facility] General Strength Comments: Grossly 4/5, endurance impaired LLE Assessment LLE  Assessment: Exceptions to [facility] General Strength Comments: Grossly 4/5, endurance impaired  Care Tool Care Tool Bed Mobility Roll left and right activity   Roll left and right assist level: Minimal Assistance - Patient > 75%    Sit to lying activity   Sit to lying assist level: Minimal Assistance - Patient > 75%    Lying to sitting on side of bed activity   Lying to sitting on side of bed assist level: the ability to move from lying on the back to sitting on the side of the bed with no back support.: Minimal Assistance - Patient > 75%     Care Tool Transfers Sit to stand transfer   Sit to stand assist level:  Minimal Assistance - Patient > 75%    Chair/bed transfer   Chair/bed transfer assist level: Minimal Assistance - Patient > 75%    Car transfer   Car transfer assist level: Minimal Assistance - Patient > 75%      Care Tool Locomotion Ambulation   Assist level: Minimal Assistance - Patient > 75% Assistive device: No Device Max distance: 150'  Walk 10 feet activity   Assist level: Minimal Assistance - Patient > 75% Assistive device: No Device   Walk 50 feet with 2 turns activity   Assist level: Minimal Assistance - Patient > 75% Assistive device: No Device  Walk 150 feet activity   Assist level: Minimal Assistance - Patient > 75% Assistive device: No Device  Walk 10 feet on uneven surfaces activity   Assist level: Minimal Assistance - Patient > 75%    Stairs   Assist level: Minimal Assistance - Patient > 75% Stairs assistive device: 2 hand rails Max number of stairs: 12  Walk up/down 1 step activity   Walk up/down 1 step (curb) assist level: Minimal Assistance - Patient > 75% Walk up/down 1 step or curb assistive device: 2 hand rails  Walk up/down 4 steps activity   Walk up/down 4 steps assist level: Minimal Assistance - Patient > 75% Walk up/down 4 steps assistive device: 2 hand rails  Walk up/down 12 steps activity   Walk up/down 12 steps assist level: Minimal  Assistance - Patient > 75% Walk up/down 12 steps assistive device: 2 hand rails  Pick up small objects from floor   Pick up small object from the floor assist level: Moderate Assistance - Patient 50 - 74%    Wheelchair Is the patient using a wheelchair?: Yes Type of Wheelchair: Manual   Wheelchair assist level: Dependent - Patient 0% Max wheelchair distance: 150'  Wheel 50 feet with 2 turns activity   Assist Level: Dependent - Patient 0%  Wheel 150 feet activity   Assist Level: Dependent - Patient 0%    Refer to Care Plan for Long Term Goals  SHORT TERM GOAL WEEK 1 PT Short Term Goal 1 (Week 1): STGs = LTGs  Recommendations for other services: Therapeutic Recreation  Other Dance  Skilled Therapeutic Intervention  Evaluation completed (see details above and below) with education on PT POC and goals and individual treatment initiated with focus on balance, transfers, ambulation, car transfers, and stair training. Pt received seated in WC and agrees to therapy. No complaint of pain, but does report feeling nauseous. Pt provides frequent rest breaks to manage nausea. WC transport to gym. Pt performs ramp navigation and car transfer without AD, with minA and cues for sequencing and positioning. Seated rest break. Pt ambulates x150' without AD, with minA to provide trunk stability due to slight LOBs and increased postural sway, as well as cueing to increasing stride length and gait speed to improve balance. Seated rest break. Pt then completes x12 steps with BHRs and minA, with cues for step sequencing and safety. Following stair training, pt has increased nausea and is positive for emesis. RN notified. Pt performs stand step back to bed with minA and cues for initiation and positioning. Pt left seated at EOB with RN present and all needs within reach.   Mobility Bed Mobility Bed Mobility: Supine to Sit;Sit to Supine Supine to Sit: Minimal Assistance - Patient > 75% Sit to Supine: Minimal  Assistance - Patient > 75% Transfers Transfers: Sit to Stand;Stand to Sit;Stand Pivot Transfers Sit to  Stand: Minimal Assistance - Patient > 75% Stand to Sit: Minimal Assistance - Patient > 75% Stand Pivot Transfers: Minimal Assistance - Patient > 75% Stand Pivot Transfer Details: Tactile cues for weight shifting;Tactile cues for sequencing;Tactile cues for posture;Verbal cues for technique Transfer (Assistive device): None Locomotion  Gait Ambulation: Yes Gait Assistance: Minimal Assistance - Patient > 75% Gait Distance (Feet): 150 Feet Assistive device: None Gait Assistance Details: Verbal cues for technique;Verbal cues for sequencing;Tactile cues for posture Gait Gait: Yes Gait Pattern: Impaired Gait Pattern: Decreased stride length;Decreased trunk rotation Gait velocity: decreased Stairs / Additional Locomotion Stairs: Yes Stairs Assistance: Minimal Assistance - Patient > 75% Stair Management Technique: Two rails Number of Stairs: 12 Height of Stairs: 6 Ramp: Minimal Assistance - Patient >75% Curb: Minimal Assistance - Patient >75% Wheelchair Mobility Wheelchair Mobility: No   Discharge Criteria: Patient will be discharged from PT if patient refuses treatment 3 consecutive times without medical reason, if treatment goals not met, if there is a change in medical status, if patient makes no progress towards goals or if patient is discharged from hospital.  The above assessment, treatment plan, treatment alternatives and goals were discussed and mutually agreed upon: by patient and by family  Elsie JAYSON Dawn, PT, DPT 06/18/2024, 11:50 AM

## 2024-06-18 NOTE — Progress Notes (Addendum)
 Inpatient Rehabilitation Admission Medication Review by a Pharmacist  A complete drug regimen review was completed for this patient to identify any potential clinically significant medication issues.  High Risk Drug Classes Is patient taking? Indication by Medication  Antipsychotic Yes Seroquel  - delirium Compazine  - N/V  Anticoagulant Yes Heparin  - DVT px  Antibiotic Yes Keflex  - UTI  Opioid No   Antiplatelet Yes ASA - CVA  Hypoglycemics/insulin  Yes SSI - DM  Vasoactive Medication Yes Metoprolol  - HTN  Chemotherapy No   Other Yes MVI/thiamine /boost Flomax  - BPH Lipitor - HLD Trazodone  - sleep Benadryl  - allergies     Type of Medication Issue Identified Description of Issue Recommendation(s)  Drug Interaction(s) (clinically significant)     Duplicate Therapy     Allergy     No Medication Administration End Date     Incorrect Dose     Additional Drug Therapy Needed     Significant med changes from prior encounter (inform family/care partners about these prior to discharge).    Other       Clinically significant medication issues were identified that warrant physician communication and completion of prescribed/recommended actions by midnight of the next day:  No  Name of provider notified for urgent issues identified:   Provider Method of Notification:     Pharmacist comments:   Time spent performing this drug regimen review (minutes):  20   Sergio Batch, PharmD, [place], AAHIVP, CPP Infectious Disease Pharmacist 06/17/2024 2:49 PM

## 2024-06-18 NOTE — Progress Notes (Signed)
 PHARMACY NOTE:  ANTIMICROBIAL RENAL DOSAGE ADJUSTMENT  Current antimicrobial regimen includes a mismatch between antimicrobial dosage and estimated renal function.  As per policy approved by the Pharmacy & Therapeutics and Medical Executive Committees, the antimicrobial dosage will be adjusted accordingly.  Current antimicrobial dosage:  Kelfex 500mg  BID  Indication: UTI/bacteremia  Renal Function:  Estimated Creatinine Clearance: 6.9 mL/min (A) (by C-G formula based on SCr of 4.83 mg/dL (H)). []      On intermittent HD, scheduled: []      On CRRT    Antimicrobial dosage has been changed to:  Addendum>>Will keep 500mg  BID since pt has bacteremia.   Additional comments:  Sergio Batch, PharmD, BCIDP, AAHIVP, CPP Infectious Disease Pharmacist 06/18/2024 9:11 AM

## 2024-06-18 NOTE — Progress Notes (Signed)
 Inpatient Rehabilitation  Patient information reviewed and entered into eRehab system by Burnard Mealing, OTR/L, Rehab Quality Coordinator.   Information including medical coding, functional ability and quality indicators will be reviewed and updated through discharge.

## 2024-06-18 NOTE — Plan of Care (Signed)
  Problem: RH Balance Goal: LTG Patient will maintain dynamic standing with ADLs (OT) Description: LTG:  Patient will maintain dynamic standing balance with assist during activities of daily living (OT)  Flowsheets (Taken 06/18/2024 1254) LTG: Pt will maintain dynamic standing balance during ADLs with: Supervision/Verbal cueing   Problem: Sit to Stand Goal: LTG:  Patient will perform sit to stand in prep for activites of daily living with assistance level (OT) Description: LTG:  Patient will perform sit to stand in prep for activites of daily living with assistance level (OT) Flowsheets (Taken 06/18/2024 1254) LTG: PT will perform sit to stand in prep for activites of daily living with assistance level: Supervision/Verbal cueing   Problem: RH Bathing Goal: LTG Patient will bathe all body parts with assist levels (OT) Description: LTG: Patient will bathe all body parts with assist levels (OT) Flowsheets (Taken 06/18/2024 1254) LTG: Pt will perform bathing with assistance level/cueing: Supervision/Verbal cueing   Problem: RH Dressing Goal: LTG Patient will perform upper body dressing (OT) Description: LTG Patient will perform upper body dressing with assist, with/without cues (OT). Flowsheets (Taken 06/18/2024 1254) LTG: Pt will perform upper body dressing with assistance level of: Supervision/Verbal cueing Goal: LTG Patient will perform lower body dressing w/assist (OT) Description: LTG: Patient will perform lower body dressing with assist, with/without cues in positioning using equipment (OT) Flowsheets (Taken 06/18/2024 1254) LTG: Pt will perform lower body dressing with assistance level of: Supervision/Verbal cueing   Problem: RH Toileting Goal: LTG Patient will perform toileting task (3/3 steps) with assistance level (OT) Description: LTG: Patient will perform toileting task (3/3 steps) with assistance level (OT)  Flowsheets (Taken 06/18/2024 1254) LTG: Pt will perform toileting task  (3/3 steps) with assistance level: Supervision/Verbal cueing   Problem: RH Laundry Goal: LTG Patient will perform laundry w/assist, cues (OT) Description: LTG: Patient will perform laundry with assistance, with/without cues (OT). Flowsheets (Taken 06/18/2024 1254) LTG: Pt will perform laundry with assistance level of: Supervision/Verbal cueing   Problem: RH Toilet Transfers Goal: LTG Patient will perform toilet transfers w/assist (OT) Description: LTG: Patient will perform toilet transfers with assist, with/without cues using equipment (OT) Flowsheets (Taken 06/18/2024 1254) LTG: Pt will perform toilet transfers with assistance level of: Supervision/Verbal cueing   Problem: RH Tub/Shower Transfers Goal: LTG Patient will perform tub/shower transfers w/assist (OT) Description: LTG: Patient will perform tub/shower transfers with assist, with/without cues using equipment (OT) Flowsheets (Taken 06/18/2024 1254) LTG: Pt will perform tub/shower stall transfers with assistance level of: Supervision/Verbal cueing   Problem: RH Memory Goal: LTG Patient will demonstrate ability for day to day recall/carry over during activities of daily living with assistance level (OT) Description: LTG:  Patient will demonstrate ability for day to day recall/carry over during activities of daily living with assistance level (OT). Flowsheets (Taken 06/18/2024 1254) LTG:  Patient will demonstrate ability for day to day recall/carry over during activities of daily living with assistance level (OT): Supervision

## 2024-06-18 NOTE — Discharge Summary (Signed)
 Physician Discharge Summary  Patient ID: Sara [name] MRN: [id number] DOB/AGE: 83-Feb-1942 83 y.o.  Admit date: 06/17/2024 Discharge date: 06/30/2024  Discharge Diagnoses:  Principal Problem:   CVA (cerebral vascular accident) [facility]) Active Problems:   Hypertension associated with diabetes (HCC)   Stage 3a chronic kidney disease (CKD) (HCC)   Atherosclerotic vascular disease   UTI (urinary tract infection)   Dysphagia   Protein-calorie malnutrition, severe   Gastroesophageal reflux disease   Acute renal failure superimposed on stage 3b chronic kidney disease (HCC)   Dysarthria   Normocytic anemia   AKI (acute kidney injury) (HCC)   Hyperkalemia   Acute urinary retention   Left pontine stroke (HCC)   Hyperlipidemia associated with type 2 diabetes mellitus (HCC)   Transaminitis   Discharged Condition: stable  Significant Diagnostic Studies: DG Abd 1 View Result Date: 06/24/2024 CLINICAL DATA:  Constipation EXAM: ABDOMEN - 1 VIEW COMPARISON:  03/26/2024 FINDINGS: Moderate stool within the upper ascending and descending colon. No abnormal rectal distention. No small bowel dilatation or evidence of obstruction. Densities in the pelvis likely represent contrast within colonic diverticula. No radiopaque calculi. Scoliotic curvature of the spine. IMPRESSION: Moderate stool in the upper ascending and descending colon. No bowel obstruction. Electronically Signed   By: Andrea Gasman M.D.   On: 06/24/2024 12:43   US  Abdomen Limited RUQ (LIVER/GB) Result Date: 06/19/2024 CLINICAL DATA:  [id number] Nausea AND vomiting [id number] EXAM: ULTRASOUND ABDOMEN LIMITED RIGHT UPPER QUADRANT COMPARISON:  CT abdomen 09/03/2022 FINDINGS: Gallbladder: No gallstones or wall thickening visualized. No sonographic Murphy sign noted by sonographer. Common bile duct: Diameter: 4 mm. Liver: Question slightly nodular hepatic contour. No focal lesion identified. Within normal limits in parenchymal echogenicity. Portal  vein is patent on color Doppler imaging with normal direction of blood flow towards the liver. Other: Trace volume simple free fluid ascites. Right pleural effusion. IMPRESSION: 1. Question slightly nodular hepatic contour. 2. Trace volume simple free fluid ascites. 3. Right pleural effusion. Electronically Signed   By: Morgane  Naveau M.D.   On: 06/19/2024 21:06   ECHOCARDIOGRAM COMPLETE Result Date: 06/15/2024    ECHOCARDIOGRAM REPORT   Patient Name:   Sara [name] Date of Exam: 06/15/2024 Medical Rec #:  [id number]         Height:       61.0 in Accession #:    [id number]        Weight:       91.0 lb Date of Birth:  [birth year]         BSA:          1.352 m Patient Age:    83 years          BP:           152/59 mmHg Patient Gender: F                 HR:           111 bpm. Exam Location:  Inpatient Procedure: 2D Echo, Color Doppler and Cardiac Doppler (Both Spectral and Color            Flow Doppler were utilized during procedure). Indications:    Stroke  History:        Patient has prior history of Echocardiogram examinations, most                 recent 04/11/2024. Risk Factors:Diabetes.  Sonographer:    Benard Stallion Referring Phys: [id number] VISHAL R PATEL IMPRESSIONS  1. Left  ventricular ejection fraction, by estimation, is 65 to 70%. The left ventricle has normal function. The left ventricle has no regional wall motion abnormalities. Left ventricular diastolic parameters are consistent with Grade I diastolic dysfunction (impaired relaxation).  2. Right ventricular systolic function is normal. The right ventricular size is normal. There is mildly elevated pulmonary artery systolic pressure. The estimated right ventricular systolic pressure is 37.1 mmHg.  3. The mitral valve is normal in structure. No evidence of mitral valve regurgitation. No evidence of mitral stenosis.  4. The aortic valve is tricuspid. There is mild calcification of the aortic valve. Aortic valve regurgitation is not visualized. No  aortic stenosis is present.  5. The inferior vena cava is normal in size with greater than 50% respiratory variability, suggesting right atrial pressure of 3 mmHg. FINDINGS  Left Ventricle: Left ventricular ejection fraction, by estimation, is 65 to 70%. The left ventricle has normal function. The left ventricle has no regional wall motion abnormalities. The left ventricular internal cavity size was normal in size. There is  no left ventricular hypertrophy. Left ventricular diastolic parameters are consistent with Grade I diastolic dysfunction (impaired relaxation). Right Ventricle: The right ventricular size is normal. No increase in right ventricular wall thickness. Right ventricular systolic function is normal. There is mildly elevated pulmonary artery systolic pressure. The tricuspid regurgitant velocity is 2.92  m/s, and with an assumed right atrial pressure of 3 mmHg, the estimated right ventricular systolic pressure is 37.1 mmHg. Left Atrium: Left atrial size was normal in size. Right Atrium: Right atrial size was normal in size. Pericardium: There is no evidence of pericardial effusion. Mitral Valve: The mitral valve is normal in structure. Mild mitral annular calcification. No evidence of mitral valve regurgitation. No evidence of mitral valve stenosis. MV peak gradient, 8.9 mmHg. The mean mitral valve gradient is 4.0 mmHg. Tricuspid Valve: The tricuspid valve is normal in structure. Tricuspid valve regurgitation is trivial. Aortic Valve: The aortic valve is tricuspid. There is mild calcification of the aortic valve. Aortic valve regurgitation is not visualized. No aortic stenosis is present. Aortic valve mean gradient measures 5.5 mmHg. Aortic valve peak gradient measures 9.7 mmHg. Aortic valve area, by VTI measures 2.09 cm. Pulmonic Valve: The pulmonic valve was normal in structure. Pulmonic valve regurgitation is not visualized. Aorta: The aortic root is normal in size and structure. Venous: The  inferior vena cava is normal in size with greater than 50% respiratory variability, suggesting right atrial pressure of 3 mmHg. IAS/Shunts: No atrial level shunt detected by color flow Doppler.  LEFT VENTRICLE PLAX 2D LVIDd:         3.10 cm   Diastology LVIDs:         1.90 cm   LV e' medial:    8.27 cm/s LV PW:         0.90 cm   LV E/e' medial:  9.9 LV IVS:        0.90 cm   LV e' lateral:   7.07 cm/s LVOT diam:     1.70 cm   LV E/e' lateral: 11.6 LV SV:         48 LV SV Index:   35 LVOT Area:     2.27 cm  RIGHT VENTRICLE RV Basal diam:  3.30 cm RV Mid diam:    2.60 cm RV S prime:     19.00 cm/s TAPSE (M-mode): 2.0 cm LEFT ATRIUM             Index  RIGHT ATRIUM          Index LA diam:        2.50 cm 1.85 cm/m   RA Area:     8.01 cm LA Vol (A2C):   25.2 ml 18.65 ml/m  RA Volume:   14.80 ml 10.95 ml/m LA Vol (A4C):   32.6 ml 24.12 ml/m LA Biplane Vol: 31.4 ml 23.23 ml/m  AORTIC VALVE AV Area (Vmax):    2.05 cm AV Area (Vmean):   1.96 cm AV Area (VTI):     2.09 cm AV Vmax:           156.00 cm/s AV Vmean:          109.000 cm/s AV VTI:            0.229 m AV Peak Grad:      9.7 mmHg AV Mean Grad:      5.5 mmHg LVOT Vmax:         141.00 cm/s LVOT Vmean:        94.000 cm/s LVOT VTI:          0.211 m LVOT/AV VTI ratio: 0.92  AORTA Ao Root diam: 2.40 cm MITRAL VALVE                TRICUSPID VALVE MV Area (PHT): 6.54 cm     TR Peak grad:   34.1 mmHg MV Area VTI:   2.13 cm     TR Vmax:        292.00 cm/s MV Peak grad:  8.9 mmHg MV Mean grad:  4.0 mmHg     SHUNTS MV Vmax:       1.49 m/s     Systemic VTI:  0.21 m MV Vmean:      90.2 cm/s    Systemic Diam: 1.70 cm MV Decel Time: 116 msec MV E velocity: 81.90 cm/s MV A velocity: 162.00 cm/s MV E/A ratio:  0.51 Dalton McleanMD Electronically signed by Ezra Kanner Signature Date/Time: 06/15/2024/8:28:47 PM    Final    US  RENAL Result Date: 06/14/2024 CLINICAL DATA:  Acute renal failure superimposed on stage 3 chronic kidney disease. EXAM: RENAL / URINARY TRACT  ULTRASOUND COMPLETE COMPARISON:  Ultrasound 04/24/2024 FINDINGS: Right Kidney: Renal measurements: 10.9 x 5.7 x 5.5 cm = volume: 179 mL. Increased cortical echogenicity. 1.1 x 1.0 x 0.8 cm cyst. No follow-up recommended. No hydronephrosis. Left Kidney: Renal measurements: 9.9 x 5.2 x 5.6 cm = volume: 152 mL. Increased cortical echogenicity. No mass or hydronephrosis. Bladder: Foley catheter in the decompressed bladder. Other: None. IMPRESSION: Increased cortical echogenicity bilaterally compatible with medical renal disease. No hydronephrosis. Electronically Signed   By: Norman Gatlin M.D.   On: 06/14/2024 21:04   MR BRAIN WO CONTRAST Result Date: 06/14/2024 CLINICAL DATA:  Mental status change, unknown cause. EXAM: MRI HEAD WITHOUT CONTRAST TECHNIQUE: Multiplanar, multiecho pulse sequences of the brain and surrounding structures were obtained without intravenous contrast. COMPARISON:  Head MRI 03/26/2024 FINDINGS: Brain: A punctate acute infarct is noted in the left pons. Patchy T2 hyperintensities in the cerebral white matter bilaterally are similar to the prior MRI and are nonspecific but compatible with moderate chronic small vessel ischemic disease. A chronic lacunar infarct at the posterior aspect of the left putamen is unchanged. There is mild generalized cerebral atrophy. Vascular: Major intracranial vascular flow voids are preserved. Skull and upper cervical spine: Unremarkable bone marrow signal. Sinuses/Orbits: Bilateral cataract extraction. Minimal mucosal thickening in the paranasal sinuses. Trace bilateral  mastoid fluid. Other: None. IMPRESSION: 1. Punctate acute left pontine infarct. 2. Moderate chronic small vessel ischemic disease. Electronically Signed   By: Dasie Hamburg M.D.   On: 06/14/2024 19:11   DG Chest Port 1 View Result Date: 06/14/2024 CLINICAL DATA:  Lethargy possible sepsis EXAM: PORTABLE CHEST 1 VIEW COMPARISON:  04/10/2024 FINDINGS: The heart size and mediastinal contours are  within normal limits. Both lungs are clear. The visualized skeletal structures are unremarkable. Aortic atherosclerosis. Multiple skin fold artifact over left chest. IMPRESSION: No active disease. Electronically Signed   By: Luke Bun M.D.   On: 06/14/2024 17:00   DG Knee Complete 4 Views Right Result Date: 06/14/2024 CLINICAL DATA:  Right knee pain EXAM: RIGHT KNEE - COMPLETE 4+ VIEW COMPARISON:  None Available. FINDINGS: No evidence of fracture, dislocation, or joint effusion. No evidence of arthropathy or other focal bone abnormality. Soft tissues are unremarkable. IMPRESSION: No acute osseous findings. Electronically Signed   By: Michaeline Blanch M.D.   On: 06/14/2024 15:12   DG Tibia/Fibula Right Result Date: 06/14/2024 CLINICAL DATA:  Pain EXAM: RIGHT TIBIA AND FIBULA - 2 VIEW COMPARISON:  Same day knee radiographs FINDINGS: There is no evidence of fracture or other focal bone lesions. Scattered vascular calcifications. No soft tissue swelling. IMPRESSION: No acute osseous findings. Electronically Signed   By: Michaeline Blanch M.D.   On: 06/14/2024 15:11   DG SWALLOW FUNC OP MEDICARE SPEECH PATH Result Date: 06/03/2024 Table formatting from the original result was not included. Modified Barium Swallow Study Patient Details Name: Sara [name] MRN: [id number] Date of Birth: [birth year] Today's Date: 06/03/2024 HPI/PMH: HPI: Sabrea Sankey is an 83 y.o. female who was referred for an OP MBS. PMH: dysphagia with h/o PEG (removed 12/24/22), HTN, HLD, DM-2, CKD 3B with recent admissions 5/29, 6/13, and 04/24/24 for initial concern of CVA; negative workup but positive for AKI and UTI. Family has expressed concerns about possible dysphagia and impact on PO intake. Pt describes waxing/waning appetite. She had a remote dysphagia in Nov 2023 related to idiopathic cranial neuropathy of right hypoglossal; severity of dysphagia required PEG- has demonstrated significant improvement since this time. Clinical Impression:  Clinical Impression: Pt's swallow function has improved significantly since initial Nov 2023 MBS. She demonstrates mildly prolonged oral preparation with complete recollection; trace oral residue; consistent high penetration of thin liquids (PAS 2) into the larynx with ejection upon completion of the swallow and no aspiration. DIGEST score is Grade 1, mild pharyngeal dysphagia.  Results of study were discussed and viewed in real time with pt and her son, [name]. Recommend pt continue regular solids/thin liquids; pills whole with liquid or puree. No SLP f/u is needed. Factors that may increase risk of adverse event in presence of aspiration Noe & Lianne 2021): No data recorded Recommendations/Plan: Swallowing Evaluation Recommendations Swallowing Evaluation Recommendations Recommendations: PO diet PO Diet Recommendation: Regular; Thin liquids (Level 0) Liquid Administration via: Cup; Straw Medication Administration: Whole meds with liquid Supervision: Patient able to self-feed Oral care recommendations: Oral care BID (2x/day) Treatment Plan Treatment Plan Follow-up recommendations: No SLP follow up Recommendations Recommendations for follow up therapy are one component of a multi-disciplinary discharge planning process, led by the attending physician.  Recommendations may be updated based on patient status, additional functional criteria and insurance authorization. Assessment: Orofacial Exam: Orofacial Exam Oral Cavity: Oral Hygiene: WFL Oral Cavity - Dentition: Adequate natural dentition Orofacial Anatomy: WFL Anatomy: Anatomy: WFL Boluses Administered: Boluses Administered Boluses Administered: Thin liquids (Level 0); Mildly  thick liquids (Level 2, nectar thick); Puree; Solid  Oral Impairment Domain: Oral Impairment Domain Lip Closure: No labial escape Tongue control during bolus hold: Cohesive bolus between tongue to palatal seal Bolus preparation/mastication: Slow prolonged chewing/mashing with complete  recollection Bolus transport/lingual motion: Brisk tongue motion Oral residue: Trace residue lining oral structures Location of oral residue : Floor of mouth; Tongue Initiation of pharyngeal swallow : Posterior laryngeal surface of the epiglottis  Pharyngeal Impairment Domain: Pharyngeal Impairment Domain Soft palate elevation: No bolus between soft palate (SP)/pharyngeal wall (PW) Laryngeal elevation: Complete superior movement of thyroid cartilage with complete approximation of arytenoids to epiglottic petiole Anterior hyoid excursion: Complete anterior movement Epiglottic movement: Complete inversion Laryngeal vestibule closure: Complete, no air/contrast in laryngeal vestibule Pharyngeal stripping wave : Present - complete Pharyngeal contraction (A/P view only): N/A Pharyngoesophageal segment opening: Complete distension and complete duration, no obstruction of flow Tongue base retraction: Trace column of contrast or air between tongue base and PPW Pharyngeal residue: Trace residue within or on pharyngeal structures Location of pharyngeal residue: Valleculae  Esophageal Impairment Domain: Esophageal Impairment Domain Esophageal clearance upright position: Complete clearance, esophageal coating Pill: Pill Consistency administered: Thin liquids (Level 0) Penetration/Aspiration Scale Score: Penetration/Aspiration Scale Score 1.  Material does not enter airway: Mildly thick liquids (Level 2, nectar thick); Puree; Solid; Pill 2.  Material enters airway, remains ABOVE vocal cords then ejected out: Thin liquids (Level 0) Compensatory Strategies: No data recorded  General Information: Caregiver present: Yes  Diet Prior to this Study: Regular; Thin liquids (Level 0)   Temperature : Normal   Respiratory Status: WFL   Supplemental O2: None (Room air)   History of Recent Intubation: No  Behavior/Cognition: Alert; Cooperative; Pleasant mood Self-Feeding Abilities: Able to self-feed Baseline vocal quality/speech: Normal  Volitional Cough: Able to elicit Volitional Swallow: Able to elicit Exam Limitations: No limitations Goal Planning: No data recorded No data recorded No data recorded No data recorded No data recorded Pain: Pain Assessment Pain Assessment: No/denies pain End of Session: Start Time:SLP Start Time (ACUTE ONLY): 1116 Stop Time: SLP Stop Time (ACUTE ONLY): 1144 Time Calculation:SLP Time Calculation (min) (ACUTE ONLY): 28 min Charges: SLP Evaluations $ SLP Speech Visit: 1 Visit SLP Evaluations $Outpatient MBS Swallow: 1 Procedure SLP visit diagnosis: SLP Visit Diagnosis: Dysphagia, pharyngeal phase (R13.13) Past Medical History: Past Medical History: Diagnosis Date  Allergy   Dizziness 02/21/2018  Estrogen deficiency 01/13/2018  Need for prophylactic vaccination against Streptococcus pneumoniae (pneumococcus) 01/13/2018  Normocytic anemia 04/11/2024  Right knee pain 05/29/2019  Routine general medical examination at a health care facility 01/13/2018  Seasonal allergies 06/21/2020  Vaginal itching 06/21/2020  Vision changes 01/13/2018 Past Surgical History: Past Surgical History: Procedure Laterality Date  DG THUMB RIGHT HAND ([facility] HX) Right   EYE SURGERY    IR GASTROSTOMY TUBE MOD SED  09/05/2022  IR GASTROSTOMY TUBE REMOVAL  12/04/2022  TUBAL LIGATION   Vona Palma Laurice 06/03/2024, 4:03 PM Amanda L. Vona, MA CCC/SLP Clinical Specialist - Acute Care SLP Acute Rehabilitation Services Office number [phone] CLINICAL DATA:  Patient with history of pharyngeal dysphagia presents to assess improvement of swallowing. Modified barium swallow requested. EXAM: MODIFIED BARIUM SWALLOW TECHNIQUE: Different consistencies of barium were administered orally to the patient by the Speech Pathologist. Imaging of the pharynx was performed in the lateral projection. Kacie Matthews PA-C was present in the fluoroscopy room during this study, which was supervised and interpreted by Dr. Lynwood Seip. Radiologist, not in attendance for the exam.  FLUOROSCOPY: Radiation  Exposure Index (as provided by the fluoroscopic device): 13.4 mGy Kerma COMPARISON:  None Available. FINDINGS: Vestibular Penetration: Multiple episodes of penetration with thin liquids but no aspiration seen. Other:  None. IMPRESSION: Persistent penetration with thin liquids, but no aspiration observed. Please refer to the Speech Pathologists report for complete details and recommendations. Electronically Signed   By: Lynwood Landy Raddle M.D.   On: 06/03/2024 12:55   Labs:  Basic Metabolic Panel: Recent Labs  Lab 06/25/24 0525 06/26/24 0447 06/27/24 0550 06/28/24 1502 06/29/24 0531  NA 137 134* 135 135 138  K 5.9* 5.9* 5.8* 5.1 5.0  CL 107 105 104 106 108  CO2 22 21* 23 20* 23  GLUCOSE 112* 143* 118* 170* 97  BUN 52* 54* 57* 49* 47*  CREATININE 3.19* 3.19* 2.94* 2.88* 2.87*  CALCIUM  9.3 9.4 9.5 8.9 9.4    CBC: Recent Labs  Lab 06/23/24 0454 06/24/24 0439 06/25/24 0525 06/26/24 0447 06/29/24 0531  WBC 10.4   < > 10.2 9.9 7.8  NEUTROABS 5.9  --   --  5.4  --   HGB 6.9*   < > 8.3* 8.7* 8.7*  HCT 21.0*   < > 25.0* 26.8* 26.1*  MCV 78.9*   < > 82.8 83.2 83.7  PLT 423*   < > 460* 535* 498*   < > = values in this interval not displayed.    CBG: Recent Labs  Lab 06/24/24 0621 06/24/24 1159 06/24/24 1630 06/24/24 2120 06/25/24 0620  GLUCAP 92 181* 169* 185* 106*    Brief HPI:   MATALIE ROMBERGER is a 83 y.o. female  with a past medical history of chronic kidney disease (CKD) stage 3b, type 2 diabetes, hypertension, and hyperlipidemia. The patient presented to the [facility]24 for gait and balance issues that developed over the past 2-3 days. She reported having to brace herself using walls and furniture in her home due to instability. The patient also noted ongoing issues with dysphagia and poor oral intake, leading to several recent admissions for acute kidney injury (AKI).  ED labs: WBC 18.6, hemoglobin 10.1, platelets 302, sodium  130, potassium 6.4, bicarb 13, BUN 130, creatinine 9.52, serum glucose 323, lactic acid 3.9 > 2.3. Patient was found to have severe sepsis due to Klebsiella UTI. Nephrology consulted.  Renal ultrasound without any acute abnormalities.  A Foley catheter was placed on admission and nephrology was consulted where she received bicarb infusion, IVFs, and empiric antibiotics for urosepsis.    Additionally MRI of brain showed punctate acute left pontine infarct and moderate chronic small vessel ischemic disease. Neurology was consulted based on findings and felt CVA to be small and likely ischemic in etiology.  Recommendations were to continue aspirin  81 mg daily and delay CT angio until renal function improved. Transthoracic echo revealed normal ejection fraction grade 1 diastolic dysfunction. Per chart review the patient lives at home with her children in a 1 level home with one-step to enter and has been modified independent use of rollator at home.  She currently transfers sit to stand with min assist and walked 50 feet min assist to CGA. Therapy evaluations completed due to patient decreased functional mobility was admitted for a comprehensive rehab program. Patient was just nauseous and is mildly tachycardic at rest.    Hospital Course: COLLEN HOSTLER was admitted to rehab 06/17/2024 for inpatient therapies to consist of PT, ST and OT at least three hours five days a week. Past admission physiatrist,  therapy team and rehab RN have worked together to provide customized collaborative inpatient rehab. Ms. Alviar was admitted for functional deficits due to an acute left pontine infarct. She was started on heparin  sub-q for VT prophylaxis and continued on aspirin  81 mg daily. During her admission, Seroquel  and melatonin were administered for sleep and agitation. Her skin and wounds were routinely monitored with pressure relief measures. Speech therapy was consulted due cognitive impairments; speech and language  deficits. Due to poor intake dieticians were also consulted and provided recommendations for dietary supplements and nausea management, which improved on the current regimen.   An elevated potassium level of 5.1 prompted nephrology consult and was corrected with Lokelma ,subsequent levels were normal. She had a history of severe sepsis secondary to a complicated UTI with Klebsiella bacterium, treated with IV Rocephin  and transitioned to Keflex . The UA remained positive, necessitating a 10-day oral antibiotic course, which normalized her WBCs. Acute kidney injury and CKD stage 3b were managed with close monitoring of creatinine and stable renal function off IV fluids. A Foley catheter was placed due to urinary retention, and Flomax  was started. A KUB revealed moderate stool burden, requiring intermittent catheterizations managed with Cardura  and Urecholine . UA rechecks were negative for bacterial growth.Diabetes has been monitored with ac/hs CBG checks and SSI was used prn for tighter BS control. Diabetic teaching was completed and insulin  was discontinued.    She remained on atorvastatin  40 mg for hyperlipidemia. Blood pressures, slightly increased, were monitored routinely. For elevated ALT related to transaminitis and chronic liver disease, GGT was normal, and abdominal ultrasound showed no gallstones but did reveal ascites, necessitating GI follow-up. Vomiting improved, and iron deficiency anemia was addressed with a unit of packed red blood cells, stabilizing her hemoglobin. Thrombocytosis was monitored and showed improvement. Nephrology recommended establishing care post-discharge with [facility] for further monitoring.   Rehab course: During patient's stay in rehab weekly team conferences were held to monitor patient's progress, set goals and discuss barriers to discharge. At admission, patient required transfers sit to stand with min assist and walked 50 feet min assist to CGA. Patient  demonstrated progress with occupational therapy, being able to don non-skid socks with supervision and some assistance. She ambulated from the restroom to the recliner with close supervision using a rolling walker for balance support. In physical therapy, she progressed to performing sit-to-stand without an assistive device and was able to ambulate over 300 feet with a rollator, completing ramp navigation and car transfer at a modified independence level. Speech therapy also showed improvement, with the patient requiring only supervision for complex auditory comprehension tasks. She will benefit from ongoing outpatient occupational therapy. She  has had improvement in activity tolerance, balance, postural control as well as ability to compensate for deficits. She has had improvement in functional use RUE/LUE  and RLE/LLE as well as improvement in awareness  Family teaching completed upon discharge to home.      Disposition:  There are no questions and answers to display.         Diet: Low sodium Heart Healthy   Special Instructions:  -No driving smoking or alcohol  or illicit drug use.  -Gastroenterology follow up for possible cholestasis.   -Foley catheter care instructions provided with Urology follow up.   -Follow up with [facility] within 1-2 weeks.   Avoid nephrotoxic medications including NSAIDs and iodinated intravenous contrast exposure unless the latter is absolutely indicated. Preferred narcotic agents for pain control are hydromorphone , fentanyl , and methadone.  Morphine should not be used. Avoid Baclofen and avoid oral sodium phosphate and magnesium  citrate based laxatives / bowel preps. Continue strict Input and Output monitoring.   Allergies as of 06/29/2024   No Known Allergies      Medication List     STOP taking these medications    blood glucose meter kit and supplies Kit   cephALEXin  500 MG capsule Commonly known as: KEFLEX    esomeprazole  10  MG packet Commonly known as: NEXIUM    folic acid  1 MG tablet Commonly known as: FOLVITE    lactated ringers  infusion   mirtazapine  15 MG tablet Commonly known as: REMERON    NovoLOG  FlexPen 100 UNIT/ML FlexPen Generic drug: insulin  aspart   Pen Needles 3/16 31G X 5 MM Misc   tamsulosin  0.4 MG Caps capsule Commonly known as: FLOMAX        TAKE these medications    Accu-Chek Guide test strip Generic drug: glucose blood USE 1  4 TIMES DAILY   Accu-Chek Softclix Lancets lancets Please use to check blood sugar up to 4 times daily.   acetaminophen  325 MG tablet Commonly known as: TYLENOL  Take 1-2 tablets (325-650 mg total) by mouth every 4 (four) hours as needed for mild pain (pain score 1-3).   alum & mag hydroxide-simeth 200-200-20 MG/5ML suspension Commonly known as: MAALOX/MYLANTA Take 30 mLs by mouth every 4 (four) hours as needed for indigestion.   aspirin  EC 81 MG tablet Take 1 tablet (81 mg total) by mouth daily. Swallow whole.   atorvastatin  40 MG tablet Commonly known as: LIPITOR Take 1 tablet (40 mg total) by mouth at bedtime.   bethanechol  25 MG tablet Commonly known as: URECHOLINE  Take 1 tablet (25 mg total) by mouth 3 (three) times daily. Start taking on: June 30, 2024   bisacodyl  10 MG suppository Commonly known as: DULCOLAX Place 1 suppository (10 mg total) rectally daily as needed for moderate constipation.   cetirizine  10 MG tablet Commonly known as: ZYRTEC  Take 1 tablet (10 mg total) by mouth daily as needed for allergies.   cholecalciferol 25 MCG (1000 UNIT) tablet Commonly known as: VITAMIN D3 Take 1,000 Units by mouth daily.   doxazosin  2 MG tablet Commonly known as: CARDURA  Take 1 tablet (2 mg total) by mouth daily. Start taking on: June 30, 2024   ferrous sulfate  325 (65 FE) MG tablet Take 1 tablet (325 mg total) by mouth daily with breakfast. Start taking on: June 30, 2024   Gerhardt's butt cream Crea Apply 1  Application topically daily. Start taking on: June 30, 2024   hydrocortisone  25 MG suppository Commonly known as: ANUSOL -HC Place 1 suppository (25 mg total) rectally 2 (two) times daily as needed for hemorrhoids or anal itching.   hydroxypropyl methylcellulose / hypromellose 2.5 % ophthalmic solution Commonly known as: ISOPTO TEARS / GONIOVISC Place 1 drop into both eyes 4 (four) times daily as needed for dry eyes.   lactose free nutrition Liqd Take 237 mLs by mouth 3 (three) times daily with meals.   megestrol  400 MG/10ML suspension Commonly known as: MEGACE  Take 5 mLs (200 mg total) by mouth 2 (two) times daily. Start taking on: June 30, 2024   melatonin 5 MG Tabs Take 1 tablet (5 mg total) by mouth at bedtime.   metoprolol  tartrate 25 MG tablet Commonly known as: LOPRESSOR  Take 1 tablet (25 mg total) by mouth 2 (two) times daily.   multivitamin with minerals Tabs tablet Take 1 tablet by mouth daily.   polyethylene  glycol 17 g packet Commonly known as: MIRALAX  / GLYCOLAX  Take 17 g by mouth 2 (two) times daily. Start taking on: June 30, 2024   QUEtiapine  25 MG tablet Commonly known as: SEROQUEL  Take 1 tablet (25 mg total) by mouth at bedtime as needed (agitation/delirium).   senna-docusate 8.6-50 MG tablet Commonly known as: Senokot-S Take 2 tablets by mouth 2 (two) times daily. Start taking on: June 30, 2024   thiamine  100 MG tablet Commonly known as: Vitamin B-1 Take 1 tablet (100 mg total) by mouth daily.        Follow-up Information     Campbell Reynolds, NP Follow up.   Why: Call for an appointment. Contact information: [address] [place] [address] [phone]         [facility] Follow up.   Specialty: Neurology Why: Call for an appointment. Contact information: [address] [place]  [address] [phone]                Signed: Daphne LITTIE Finders 06/29/2024, 3:51 PM

## 2024-06-18 NOTE — Evaluation (Signed)
 Speech Language Pathology Assessment and Plan  Patient Details  Name: Sara [name] MRN: [id number] Date of Birth: 06-04-1941  SLP Diagnosis: Cognitive Impairments;Speech and Language deficits  Rehab Potential: Good ELOS: 7-10 days    Today's Date: 06/18/2024 SLP Individual Time: 1330-1430 SLP Individual Time Calculation (min): 60 min   Hospital Problem: Principal Problem:   CVA (cerebral vascular accident) [facility])  Past Medical History:  Past Medical History:  Diagnosis Date   Allergy    Dizziness 02/21/2018   Estrogen deficiency 01/13/2018   Need for prophylactic vaccination against Streptococcus pneumoniae (pneumococcus) 01/13/2018   Normocytic anemia 04/11/2024   Right knee pain 05/29/2019   Routine general medical examination at a health care facility 01/13/2018   Seasonal allergies 06/21/2020   Vaginal itching 06/21/2020   Vision changes 01/13/2018   Past Surgical History:  Past Surgical History:  Procedure Laterality Date   DG THUMB RIGHT HAND ([facility] HX) Right    EYE SURGERY     IR GASTROSTOMY TUBE MOD SED  09/05/2022   IR GASTROSTOMY TUBE REMOVAL  12/04/2022   TUBAL LIGATION      Assessment / Plan / Recommendation Clinical Impression Pt is an 83 year old female with a past medical history of chronic kidney disease (CKD) stage 3b, type 2 diabetes, hypertension, and hyperlipidemia. The patient presented to the [facility] on 06/15/2024 for gait and balance issues that developed over the past 2-3 days. She reported having to brace herself using walls and furniture in her home due to instability. The patient also noted ongoing issues with dysphagia and poor oral intake, leading to several recent admissions for acute kidney injury (AKI).  ED labs: WBC 18.6, hemoglobin 10.1, platelets 302, sodium 130, potassium 6.4, bicarb 13, BUN 130, creatinine 9.52, serum glucose 323, lactic acid 3.9 > 2.3. Patient was found to have severe sepsis due to Klebsiella UTI. Nephrology  consulted.  Renal ultrasound without any acute abnormalities.  A Foley catheter was placed on admission and nephrology was consulted where she received bicarb infusion, IVFs, and empiric antibiotics for urosepsis.    Additionally MRI of brain showed punctate acute left pontine infarct and moderate chronic small vessel ischemic disease. Neurology was consulted based on findings and felt CVA to be small and likely ischemic in etiology.  Recommendations were to continue aspirin  81 mg daily and delay CT angio until renal function improved. Transthoracic echo revealed normal ejection fraction grade 1 diastolic dysfunction. Per chart review the patient lives at home with her children in a 1 level home with one-step to enter and has been modified independent use of rollator at home.  She currently transfers sit to stand with min assist and walked 50 feet min assist to CGA. Therapy evaluations completed due to patient decreased functional mobility was admitted for a comprehensive rehab program  Cognitive-Linguistic: Pt appeared to present w/ mild to moderate cognitive deficits overall. Mild deficits reported at baseline, with recent exacerbation s/p CVA. She cored a 13/30 on the [facility] Mental Status (SLUMS) Exam, however, anticipate language deficits negatively impacted success. Adequate simple word finding and auditory comprehension noted in conversational tasks though mild to moderate deficits were noted in structured tasks of increased specificity. Moderate deficits noted during structured naming tasks. Mild to moderate deficits noted in the areas of attention, functional memory, and functional problem solving. Speech clear and intelligible throughout. Recommend skilled ST services to target cognitive-linguistic deficits, maximize pt independence, and facilitate return to prev roles/responsibilities.   Bedside Swallow: Anticipate slight  exacerbation of baseline mild pharyngeal dysphagia. Pt presents w/  a hx of dysphagia, as she had a PEG tube from November 2023-Feb 2024. MBSS completed 8/6 revealed consistent high penetration of thin liquids (PAS 2) into the larynx with ejection upon completion of the swallow and no aspiration. PO trials completed w/ regular textures (graham crackers), thin liquids via straw, and slightly thick liquids via straw. Despite missing lower dentures, adequate mastication noted. No overt s/s of airway invasion were evident. Pt reported increased difficulty w/ swallowing this admission, but was unable to provide adequate details as to why. Recommend cont diet of regular textures/thin liquids at this time. Also recommend continuation of meds crushed in puree (method of intake at baseline). Will continue to target diet tolerance and can repeat MBSS as needed.     Skilled Therapeutic Interventions          SLP facilitated a cognitive-linguistic and bedside swallow evaluation to assess pt's cognitive-communication skills and determine need for additional skilled ST services. See above for more information.    SLP Assessment  Patient will need skilled Speech Lanaguage Pathology Services during CIR admission    Recommendations  SLP Diet Recommendations: Thin;Age appropriate regular solids Liquid Administration via: Straw Medication Administration: Crushed with puree Supervision: Patient able to self feed Compensations: Minimize environmental distractions Postural Changes and/or Swallow Maneuvers: Seated upright 90 degrees;Upright 30-60 min after meal Oral Care Recommendations: Oral care BID Recommendations for Other Services: Therapeutic Recreation consult Therapeutic Recreation Interventions: Pet therapy;Stress management Patient destination: Home Follow up Recommendations: Outpatient SLP Equipment Recommended: None recommended by SLP    SLP Frequency 3 to 5 out of 7 days   SLP Duration  SLP Intensity  SLP Treatment/Interventions 7-10 days  Minumum of 1-2 x/day,  30 to 90 minutes  Cognitive remediation/compensation;Cueing hierarchy;Speech/Language facilitation;Functional tasks;Therapeutic Activities;Dysphagia/aspiration precaution training;Patient/family education;Therapeutic Exercise;Internal/external aids;Multimodal communication approach    Pain  No pain reported  Prior Functioning Type of Home: House  Lives With: Family Available Help at Discharge: Family Education: High School Vocation: Retired  SLP Evaluation Cognition Overall Cognitive Status: Impaired/Different from baseline Arousal/Alertness: Awake/alert Orientation Level: Oriented X4 Year: 2025 Month: August Day of Week: Correct Attention: Selective Selective Attention: Impaired Selective Attention Impairment: Functional basic Memory: Impaired Memory Impairment: Decreased short term memory;Storage deficit Decreased Short Term Memory: Verbal complex Awareness: Impaired Awareness Impairment: Emergent impairment Problem Solving: Impaired Problem Solving Impairment: Functional basic;Verbal complex Executive Function: Self Monitoring;Self Correcting;Initiating Initiating: Impaired Self Monitoring: Impaired Self Correcting: Impaired Safety/Judgment: Appears intact  Comprehension Auditory Comprehension Overall Auditory Comprehension: Impaired Yes/No Questions: Impaired Basic Biographical Questions: 76-100% accurate Complex Questions: 75-100% accurate Commands: Impaired One Step Basic Commands: 75-100% accurate Two Step Basic Commands: 75-100% accurate Complex Commands: 75-100% accurate Conversation: Complex Interfering Components: Attention;Hearing;Processing speed EffectiveTechniques: Extra processing time;Increased volume;Repetition;Slowed speech;Stressing words Visual Recognition/Discrimination Discrimination: Not tested Reading Comprehension Reading Status: Not tested Expression Expression Primary Mode of Expression: Verbal Verbal Expression Overall Verbal  Expression: Impaired Initiation: No impairment Repetition: No impairment Naming: Impairment Responsive: 76-100% accurate Confrontation: Impaired Divergent: 75-100% accurate Other Naming Comments: mild deficits noted w/ specific word finding Verbal Errors: Phonemic paraphasias;Semantic paraphasias Pragmatics: No impairment Interfering Components: Attention;Premorbid deficit Effective Techniques: Open ended questions;Semantic cues;Phonemic cues Non-Verbal Means of Communication: Not applicable Written Expression Dominant Hand: Right Written Expression: Not tested Oral Motor Oral Motor/Sensory Function Overall Oral Motor/Sensory Function: Within functional limits Motor Speech Overall Motor Speech: Appears within functional limits for tasks assessed Phonation: Normal Resonance: Within functional limits Articulation: Within functional limitis Intelligibility: Intelligible  Motor Planning: Within functional limits Motor Speech Errors: Not applicable  Care Tool Care Tool Cognition Ability to hear (with hearing aid or hearing appliances if normally used Ability to hear (with hearing aid or hearing appliances if normally used): 1. Minimal difficulty - difficulty in some environments (e.g. when person speaks softly or setting is noisy)   Expression of Ideas and Wants Expression of Ideas and Wants: 3. Some difficulty - exhibits some difficulty with expressing needs and ideas (e.g, some words or finishing thoughts) or speech is not clear   Understanding Verbal and Non-Verbal Content Understanding Verbal and Non-Verbal Content: 3. Usually understands - understands most conversations, but misses some part/intent of message. Requires cues at times to understand  Memory/Recall Ability Memory/Recall Ability : Current season;That he or she is in a hospital/hospital unit   Motor Speech Assessment  Intelligibility: Intelligible  Bedside Swallowing Assessment General Date of Onset:  06/03/24 Previous Swallow Assessment: MBSS 8/6 noted mild pharyngeal dysphagia (consistent high penetration of thin liquids (PAS 2) into the larynx with ejection upon completion of the swallow and no aspiration) and recommended continued regular diet/thin liquids Diet Prior to this Study: Regular;Thin liquids (Level 0) Temperature Spikes Noted: No Respiratory Status: Room air History of Recent Intubation: No Behavior/Cognition: Alert;Cooperative;Pleasant mood Oral Cavity - Dentition: Dentures, top Self-Feeding Abilities: Able to feed self Patient Positioning: Upright in bed Baseline Vocal Quality: Normal Volitional Swallow: Able to elicit    Short Term Goals: Week 1: SLP Short Term Goal 1 (Week 1): STGs = LTGs d/t ELOS  Refer to Care Plan for Long Term Goals  Recommendations for other services: Neuropsych and Therapeutic Recreation  Pet therapy and Stress management  Discharge Criteria: Patient will be discharged from SLP if patient refuses treatment 3 consecutive times without medical reason, if treatment goals not met, if there is a change in medical status, if patient makes no progress towards goals or if patient is discharged from hospital.  The above assessment, treatment plan, treatment alternatives and goals were discussed and mutually agreed upon: by patient  [name] 06/18/2024, 4:24 PM

## 2024-06-18 NOTE — Progress Notes (Addendum)
 PROGRESS NOTE   Subjective/Complaints:  No events overnight.  Family at bedside, stating that she got nauseous with therapies this a.m. and had to lay down.  Does endorse that Compazine  makes her tired, but does help with the nausea.  Does not have nausea or dizziness at rest.  Family notes appetite issues at baseline, had recently weaned down on Megace  at home from 3 times daily to twice.  They state this was because she was gaining weight too quickly.  Vitals stable     06/18/2024    7:10 AM 06/18/2024    2:24 AM 06/17/2024   10:30 PM  Vitals with BMI  Weight  108 lbs 11 oz   BMI  18.09   Systolic 134 147 879  Diastolic 73 68 65  Pulse  87 88    Recent Labs    06/17/24 1853 06/17/24 2048 06/18/24 0617  GLUCAP 235* 116* 75    BG variable 250 to 70s this AM Labs with K 3.4, Albumin 1.8, ALP 216 from 165; otherwise stable  P.o. intakes low - eating 25-60% meals.  Foley in place Last BM this a.m. per patient.  ROS: Denies fevers, chills, N/V, abdominal pain, constipation, diarrhea, SOB, cough, chest pain, new weakness or paraesthesias.    Objective:   No results found. Recent Labs    06/17/24 0426 06/18/24 0534  WBC 15.1* 14.4*  HGB 8.9* 8.9*  HCT 24.5* 24.5*  PLT 224 240   Recent Labs    06/17/24 0426 06/18/24 0534  NA 134* 140  K 3.5 3.4*  CL 92* 97*  CO2 25 26  GLUCOSE 304* 78  BUN 68* 59*  CREATININE 5.52* 4.83*  CALCIUM  8.2* 8.7*    Intake/Output Summary (Last 24 hours) at 06/18/2024 0946 Last data filed at 06/18/2024 0801 Gross per 24 hour  Intake 240 ml  Output 1425 ml  Net -1185 ml        Physical Exam: Vital Signs Blood pressure 134/73, pulse 87, temperature 98.2 F (36.8 C), resp. rate 18, height 5' 5 (1.651 m), weight 49.3 kg, SpO2 99%.  Constitutional: No apparent distress. Appropriate appearance for age.  Laying in bed. HENT: No JVD. Neck Supple. Trachea midline.  Atraumatic, normocephalic. Eyes: PERRLA. EOMI. Visual fields grossly intact.  No nystagmus on EOMI. Cardiovascular: RRR, no murmurs/rub/gallops.  1+ bilateral peripheral edema.  Peripheral pulses 2+. Respiratory: CTAB. No rales, rhonchi, or wheezing. On RA.  Abdomen: + bowel sounds, normoactive. No distention or tenderness.  GU: Not examined. +Foley, draining clear urine.  Skin: C/D/I. No apparent lesions.  Peripheral IVs intact.  MSK:      No apparent deformity.     Neurologic exam:  Cognition: AAO to person, place, time and event.  Complicated by lethargy. Language: Fluent, No substitutions or neoglisms.  Mild dysarthria.   Memory: Moderate cognitive delay, complicated by medications Insight: Fair insight into current condition.  Mood: Pleasant affect, appropriate mood.  Sensation: Equal and intact in BL UE and Les.  Reflexes: 2+ in BL UE and LEs. Negative Hoffman's and babinski signs bilaterally.  CN: 2-12 grossly intact.  Coordination: No apparent tremors. No ataxia on FTN, HTS bilaterally.  Spasticity: MAS 0 in all extremities.  Strength: Antigravity against resistance all 4 extremities in bed.       Assessment/Plan: 1. Functional deficits which require 3+ hours per day of interdisciplinary therapy in a comprehensive inpatient rehab setting. Physiatrist is providing close team supervision and 24 hour management of active medical problems listed below. Physiatrist and rehab team continue to assess barriers to discharge/monitor patient progress toward functional and medical goals  Care Tool:  Bathing              Bathing assist       Upper Body Dressing/Undressing Upper body dressing        Upper body assist      Lower Body Dressing/Undressing Lower body dressing            Lower body assist       Toileting Toileting    Toileting assist Assist for toileting: Moderate Assistance - Patient 50 - 74%     Transfers Chair/bed transfer  Transfers  assist     Chair/bed transfer assist level: Moderate Assistance - Patient 50 - 74%     Locomotion Ambulation   Ambulation assist              Walk 10 feet activity   Assist           Walk 50 feet activity   Assist           Walk 150 feet activity   Assist           Walk 10 feet on uneven surface  activity   Assist           Wheelchair     Assist               Wheelchair 50 feet with 2 turns activity    Assist            Wheelchair 150 feet activity     Assist          Blood pressure 134/73, pulse 87, temperature 98.2 F (36.8 C), resp. rate 18, height 5' 5 (1.651 m), weight 49.3 kg, SpO2 99%.  1. Functional deficits secondary to acute left pontine infarct             -patient may shower             -ELOS/Goals: S 7-8 days              - stable to continue CIR  2.  Antithrombotics: -DVT/anticoagulation:  Pharmaceutical: Heparin              -antiplatelet therapy: continue Aspirin     3. Pain Management: Tylenol  as needed 4. Mood/Behavior/Sleep: LCSW to follow for evaluation and support when available.              -antipsychotic agents: Seroquel               - Sleep aid: Melatonin scheduled 5. Neuropsych/cognition: This patient is capable of making decisions on her own behalf. 6. Skin/Wound Care: routine pressure relief measures  7. Fluids/Electrolytes/Nutrition: Monitor strict I&O's- follow-up chemistries             -Speech consult  -carb modified diet + boost--continue multivitamin, thiamine   - 8/21: Nutrition consult for poor PO intakes.  Complicated by nausea, may resume Megace  3 times daily tomorrow if no improvements with nausea medication.  8.  Severe sepsis secondary to complicated UTI: Klebsiella bacteremia             -  On IV Rocephin ----transitioned to Keflex  500 mg x 7 days today 8/20  9.  AKI-CKD stage IIIb: Renal function gradually improving, avoid nephrotoxic agents             - Foley  catheter in place--begin voiding trial with PVR as renal function improves. (Ordered for 8/22- please check with family if they are ok for this as they were hesitant about removal)             - On lactated Ringer 's??  Continue   - 8/21: Switch from LR 70 cc/hr to 1/2 NS 50 cc/hr for maintenance fluids - monitor Cr daily.  Encouraging patient to drink more water .  10.  Acute urinary retention: Same as above, continue Flomax  and monitor strict I&O   11.  Acute left pontine infarct versus artifactual finding on MRI: Continue aspirin  per neurology recommendation. 12.  T2DM: 8/18 A1c 6.6              - Monitor CBG ACHS and continue SSI--allow for permissive hyperglycemia              - Add Semglee  5U daily  Recent Labs    06/17/24 1853 06/17/24 2048 06/18/24 0617  GLUCAP 235* 116* 75     - BG variable - monitor today, POs poor, lows 70s;   13. HLD: continue atorvastatin  40 mg daily   14.  HTN: Monitor BP per protocol--Lasix/HCTZ on hold starting low-dose metoprolol  25 mg twice daily for mild sinus tachycardia- discussed with Dr. Raenelle who feels tachycardia is physiologic, no hold parameters for therapy recommended             - Monitor for orthostatic hypotension--stable this AM      06/18/2024    9:53 AM 06/18/2024    7:10 AM 06/18/2024    2:24 AM  Vitals with BMI  Weight   108 lbs 11 oz  BMI   18.09  Systolic 147 134 852  Diastolic 88 73 68  Pulse 90  87   15. Transaminitis - elevated ALP. Will get GGT as add-on given other LFTs WNL.   16.  Central nausea versus central vertigo.   - Oversedated with Compazine , will schedule Zofran  4 mg 3 times daily head of meals/therapies to see if this improves symptoms   - Keep IV Compazine  and secondary for breakthrough   - No nystagmus on exam, but given location of stroke likely contribution of vertigo; will add as needed meclizine  12.5 mg 3 times daily. May need scopolamine patch   LOS: 1 days A FACE TO FACE EVALUATION WAS  PERFORMED  Joesph JAYSON Likes 06/18/2024, 9:46 AM

## 2024-06-19 ENCOUNTER — Inpatient Hospital Stay (HOSPITAL_COMMUNITY)

## 2024-06-19 LAB — BASIC METABOLIC PANEL WITH GFR
Anion gap: 10 (ref 5–15)
BUN: 51 mg/dL — ABNORMAL HIGH (ref 8–23)
CO2: 25 mmol/L (ref 22–32)
Calcium: 8.7 mg/dL — ABNORMAL LOW (ref 8.9–10.3)
Chloride: 100 mmol/L (ref 98–111)
Creatinine, Ser: 3.67 mg/dL — ABNORMAL HIGH (ref 0.44–1.00)
GFR, Estimated: 12 mL/min — ABNORMAL LOW (ref >60)
Glucose, Bld: 97 mg/dL (ref 70–99)
Potassium: 4 mmol/L (ref 3.5–5.1)
Sodium: 135 mmol/L (ref 135–145)

## 2024-06-19 LAB — GLUCOSE, CAPILLARY
Glucose-Capillary: 123 mg/dL — ABNORMAL HIGH (ref 70–99)
Glucose-Capillary: 106 mg/dL — ABNORMAL HIGH (ref 70–99)
Glucose-Capillary: 65 mg/dL — ABNORMAL LOW (ref 70–99)
Glucose-Capillary: 75 mg/dL (ref 70–99)
Glucose-Capillary: 157 mg/dL — ABNORMAL HIGH (ref 70–99)

## 2024-06-19 MED ORDER — MEGESTROL ACETATE 400 MG/10ML PO SUSP
200.0000 mg | Freq: Two times a day (BID) | ORAL | Status: DC
  Administered 2024-06-19 – 2024-06-30 (×23): 200 mg via ORAL
  Filled 2024-06-19 (×23): qty 10

## 2024-06-19 MED ORDER — CEPHALEXIN 250 MG PO CAPS
500.0000 mg | ORAL_CAPSULE | Freq: Two times a day (BID) | ORAL | Status: DC
  Administered 2024-06-19 – 2024-06-21 (×6): 500 mg via ORAL
  Filled 2024-06-19 (×6): qty 2

## 2024-06-19 NOTE — IPOC Note (Signed)
 Overall Plan of Care [facility]) Patient Details Name: Sara [name] MRN: [id number] DOB: [birth year]  Admitting Diagnosis: CVA (cerebral vascular accident) [facility])  Hospital Problems: Principal Problem:   CVA (cerebral vascular accident) [facility])     Functional Problem List: Nursing Bladder, Bowel, Edema, Endurance, Medication Management, Motor, Pain, Perception, Safety  PT Balance, Endurance, Motor, Perception, Safety  OT Balance, Perception, Safety, Cognition, Endurance, Motor  SLP Cognition, Linguistic  TR         Basic ADL's: OT Bathing, Dressing, Toileting     Advanced  ADL's: OT Laundry     Transfers: PT Bed Mobility, Bed to Chair, Car, Occupational psychologist, Research scientist (life sciences): PT Ambulation, Stairs     Additional Impairments: OT None  SLP Communication, Social Cognition, Swallowing expression, comprehension Problem Solving, Memory, Attention, Awareness  TR      Anticipated Outcomes Item Anticipated Outcome  Self Feeding MOD I  Swallowing  supervision   Basic self-care  SUP  Toileting  SUP   Bathroom Transfers SUP  Bowel/Bladder  manage bladder with toileting assistance/ manage bowels with medications  Transfers  Supervision  Locomotion  Supervision  Communication  supervision  Cognition  supervision  Pain  <4 w/ prns  Safety/Judgment  manage safety with supervision assistance   Therapy Plan: PT Intensity: Minimum of 1-2 x/day ,45 to 90 minutes PT Frequency: 5 out of 7 days PT Duration Estimated Length of Stay: 7-10 Days OT Intensity: Minimum of 1-2 x/day, 45 to 90 minutes OT Frequency: 5 out of 7 days OT Duration/Estimated Length of Stay: 7-10 days SLP Intensity: Minumum of 1-2 x/day, 30 to 90 minutes SLP Frequency: 3 to 5 out of 7 days SLP Duration/Estimated Length of Stay: 7-10 days   Team Interventions: Nursing Interventions Patient/Family Education, Pain Management, Bladder Management, Bowel Management, Disease  Management/Prevention, Medication Management, Discharge Planning  PT interventions Ambulation/gait training, Community reintegration, Neuromuscular re-education, DME/adaptive equipment instruction, Psychosocial support, UE/LE Strength taining/ROM, Stair training, Warden/ranger, Discharge planning, Pain management, Skin care/wound management, Therapeutic Activities, UE/LE Coordination activities, Cognitive remediation/compensation, Disease management/prevention, Functional mobility training, Patient/family education, Splinting/orthotics, Therapeutic Exercise, Visual/perceptual remediation/compensation  OT Interventions Balance/vestibular training, Discharge planning, Pain management, Self Care/advanced ADL retraining, Therapeutic Activities, UE/LE Coordination activities, Cognitive remediation/compensation, Disease mangement/prevention, Functional mobility training, Patient/family education, Skin care/wound managment, Therapeutic Exercise, Visual/perceptual remediation/compensation, Community reintegration, Fish farm manager, Neuromuscular re-education, Psychosocial support, Splinting/orthotics, UE/LE Strength taining/ROM  SLP Interventions Cognitive remediation/compensation, Cueing hierarchy, Speech/Language facilitation, Functional tasks, Therapeutic Activities, Dysphagia/aspiration precaution training, Patient/family education, Therapeutic Exercise, Internal/external aids, Multimodal communication approach  TR Interventions    SW/CM Interventions Discharge Planning, Psychosocial Support, Patient/Family Education   Barriers to Discharge MD  Medical stability, Home enviroment access/loayout, Lack of/limited family support, Insurance for SNF coverage, Behavior, and Nutritional means  Nursing Decreased caregiver support, Home environment access/layout Discharge: House  Discharge Home Layout: One level  Discharge Home Access: Stairs to enter  Entrance Stairs-Rails: Right, Left,  Can reach both  Entrance Stairs-Number of Steps: 2  PT Decreased caregiver support    OT None    SLP      SW Decreased caregiver support, Lack of/limited family support, Community education officer for SNF coverage     Team Discharge Planning: Destination: PT-Home ,OT- Home , SLP-Home Projected Follow-up: PT-Outpatient PT, OT-  Outpatient OT, SLP-Outpatient SLP Projected Equipment Needs: PT-To be determined, OT- To be determined, SLP-None recommended by SLP Equipment Details: PT- , OT-  Patient/family involved in discharge planning: PT- Family member/caregiver, Patient,  OT-Patient, Family member/caregiver, SLP-Patient, Family member/caregiver  MD ELOS: 7-10 days Medical Rehab Prognosis:  Good Assessment: The patient has been admitted for CIR therapies with the diagnosis of L pontine infarct. The team will be addressing functional mobility, strength, stamina, balance, safety, adaptive techniques and equipment, self-care, bowel and bladder mgt, patient and caregiver education. Goals have been set at supervision. Anticipated discharge destination is home.       See Team Conference Notes for weekly updates to the plan of care

## 2024-06-19 NOTE — Progress Notes (Signed)
 Physical Therapy Session Note  Patient Details  Name: Sara [name] MRN: [id number] Date of Birth: 22-Jul-1941  Today's Date: 06/19/2024 PT Individual Time: 1501-1545 PT Individual Time Calculation (min): 44 min   Short Term Goals: Week 1:  PT Short Term Goal 1 (Week 1): STGs = LTGs  Skilled Therapeutic Interventions/Progress Updates:     Pt received supine in bed and agrees to therapy. No complaint of pain. Supine to sit with bed features and cues for sequencing and positioning. Pt performs sit to stand with minA and cues for initiation and body mechanics. Stand step to with WC and minA, with cues for positioning. WC transport to gym. Pt performs stand step to Nustep with CGA and cues for hand placement. Pt completes Nustep for endurance training. Pt completes x12:00 at workload of 5 with average steps per minute ~30, despite PT cues to maintain speed ~50 steps per minute. PT provides frequent cues to increase amplitude of movement to maximize benefits of activity. Pt requires extended seated rest break. Pt then ambulates x320' without AD, with minA at trunk for stability, as well cues to increase stride length and gait speed to improve balance. Seated rest break. Pt then ambulates back to room with same assistance and cues. Pt left seated at EOB with all needs within reach.   Therapy Documentation Precautions:  Precautions Precautions: Fall Restrictions Weight Bearing Restrictions Per Provider Order: No   Therapy/Group: Individual Therapy  Elsie JAYSON Dawn, PT, DPT 06/19/2024, 4:38 PM

## 2024-06-19 NOTE — Progress Notes (Signed)
 Initial Nutrition Assessment  DOCUMENTATION CODES:   Severe malnutrition in context of chronic illness (CVA, dysphagia, recent health declines)  INTERVENTION:  Boost Plus po TID, each supplement provides 360 kcal and 14 grams of protein  Magic cup TID with meals, each supplement provides 290 kcal and 9 grams of protein  Liberalized diet to provide increased options and promote adequate intake; pt requested modified soft diet to help with chewing due to missing dentures, ordered DYS 3  Added 10am snack per pt request for morning hunger in between morning therapy sessions  Continued MVI w/ minerals and thiamine  100 mg supplementation daily  NUTRITION DIAGNOSIS:   Severe Malnutrition related to chronic illness (CVA, dysphagia, recent health declines) as evidenced by severe muscle depletion, severe fat depletion.  GOAL:   Patient will meet greater than or equal to 90% of their needs  MONITOR:   PO intake, Supplement acceptance  REASON FOR ASSESSMENT:   Consult Assessment of nutrition requirement/status, Poor PO  ASSESSMENT:   Pt with PMH significant for: CKDIII, dysphagia 2/2 hypoglossal nerve palsy,T2DM, HLD, HTN. Admitted to CIR to work on deficits after [facility] hospitalization for severe sepsis 2/2 UTI/Klebsiella bacteremia, AKI and acute CVA.  Recent Hospitalizations: 5/29-5/31: dysarthria, dysphagia, poor PO 6/13-6/16: AKI in setting of poor PO and dehydration 6/27-6/29: AKI in setting of poor PO and dehydration 8/17-8/20: sepsis secondary to UTI/Klebsiella, AKI, and acute CVA  8/20 admitted to CIR  Pt has been seen by RD team during each of her last 4 admissions to [facility]. Each time, pt presented with poor PO and malnutrition. Pt's family reports Megace  use for appetite stimulant, but decreased dosage due to concern pt was gaining wt too quickly. 14 months ago pt weighed 119 # before decreased intake led to significant wt loss. Pt's weight dropped to 91# but has since regained  wt and is 110#. Current wt still remains below baseline weight.  Spoke with pt who was sitting in bedside wheelchair at time of assessment. Pt's son and daughter also at bedside. Pt's appetite has still been poor during CIR admission. Pt's son reports pt will eat small bites of food at each meal, but states because pt is missing bottom dentures, pt is having difficult time chewing certain foods like meat. Discussed liberalizing diet to expand options available to pt and using a texture modified diet like DYS 3 so pt will receive soft foods, pt and children agreeable. Discussed adding magic cups to trays to help with calorie and protein intake as pt likes ice cream, pt agreeable. Asked pt if she feels like she is hungry in between meals, pt reports she does get hungry in between therapy sessions in the mornings and requested chocolate pudding as morning snack. Pt reports she has been drinking both Boost Plus shakes daily. Pt reports one of the barriers to eating is the taste of the food and not being at home to cook for herself and have home cooked meals. Discussed that liberalizing diet should help with choices.  Pt has exhibited signs of refeeding periodically throughout admission, potassium low this morning and repletion ordered; will continue thiamine  100 mg daily.   Prior to recent admission at [facility], pt had been eating well at home due to appetite stimulant Megestrol . Son and daughter report pt had been gaining wt since her admission in June and was eating constantly throughout the day. Daughter reports pt would even be up in the middle of the night needing a snack. This led to PCP recommending  decreasing dosage of stimulant. Suspect pt will need appetite stimulant while admitted to help encourage PO intake, but may start eating better once home due to having more control over options and cooking. MD ordered megestrol  BID.  Pt's wt has been stable through admission, however physical exam shows severe muscle  and fat depletions. These depletions have been noted since first admission in May, suspect it will be ongoing issue related to chronic malnutrition and recent health decline.   Admit wt to CIR: 110# Current wt: 113# (with generalized edema)  Average Meal Completion: Heart Healthy/Carb Modified 8/20-8/22: 25% average intake x 5 recorded meals Supplements: Boost Plus  Medications reviewed and include:  SSI Novolog  TID Lantus  5 units daily MVI w/ minerals Zofran  Potassium chloride  20 mEq Thiamine  100 mg daily  Labs reviewed:  Potassium 3.4 Chloride 97 BUN 59/ Creatinine 4.83 CBG x 24 hr: 75-185 mg/dL J8r 6.6  NUTRITION - FOCUSED PHYSICAL EXAM:  Flowsheet Row Most Recent Value  Orbital Region Severe depletion  Upper Arm Region Severe depletion  Thoracic and Lumbar Region Moderate depletion  Buccal Region Moderate depletion  Temple Region Severe depletion  Clavicle Bone Region Severe depletion  Clavicle and Acromion Bone Region Severe depletion  Scapular Bone Region Severe depletion  Dorsal Hand Moderate depletion  Patellar Region Moderate depletion  Anterior Thigh Region Moderate depletion  Posterior Calf Region Moderate depletion  Edema (RD Assessment) None  Hair Reviewed  Eyes Reviewed  Mouth Reviewed  Skin Reviewed  Nails Reviewed    Diet Order:   Diet Order             DIET DYS 3 Room service appropriate? Yes; Fluid consistency: Thin  Diet effective now                   EDUCATION NEEDS:   Education needs have been addressed  Skin:  Skin Assessment: Reviewed RN Assessment  Last BM:  8/17  Height:   Ht Readings from Last 1 Encounters:  06/17/24 5' 5 (1.651 m)    Weight:   Wt Readings from Last 1 Encounters:  06/19/24 51.3 kg    Ideal Body Weight:  56.8 kg  BMI:  Body mass index is 18.82 kg/m.  Estimated Nutritional Needs:   Kcal:  1400-1600  Protein:  60-75g  Fluid:  1.4-1.6L    Josette Glance, MS, RDN, LDN Clinical  Dietitian I Please reach out via secure chat

## 2024-06-19 NOTE — Progress Notes (Signed)
 PROGRESS NOTE   Subjective/Complaints:  No events overnight.  States that nausea remains present but did much better with therapies this morning.  Denies any abdominal pain. SBP mildly elevated 140s; other vitals stable Ate 25% breakfast this AM; no other recent meals  ROS: Denies fevers, chills,  abdominal pain, constipation, diarrhea, SOB, cough, chest pain, new weakness or paraesthesias.   + nausea + poor appetite  Objective:   No results found. Recent Labs    06/17/24 0426 06/18/24 0534  WBC 15.1* 14.4*  HGB 8.9* 8.9*  HCT 24.5* 24.5*  PLT 224 240   Recent Labs    06/17/24 0426 06/18/24 0534  NA 134* 140  K 3.5 3.4*  CL 92* 97*  CO2 25 26  GLUCOSE 304* 78  BUN 68* 59*  CREATININE 5.52* 4.83*  CALCIUM  8.2* 8.7*    Intake/Output Summary (Last 24 hours) at 06/19/2024 1010 Last data filed at 06/19/2024 0756 Gross per 24 hour  Intake 1469.82 ml  Output 4200 ml  Net -2730.18 ml        Physical Exam: Vital Signs Blood pressure (!) 144/64, pulse 88, temperature 98.9 F (37.2 C), temperature source Oral, resp. rate 18, height 5' 5 (1.651 m), weight 51.3 kg, SpO2 98%.  Constitutional: No apparent distress. Appropriate appearance for age.  Sitting up in bedside chair. HENT: No JVD. Neck Supple. Trachea midline. Atraumatic, normocephalic. Eyes: PERRLA. EOMI. Visual fields grossly intact.  No nystagmus Cardiovascular: RRR, no murmurs/rub/gallops.  1+ bilateral peripheral edema.  Unchanged. Respiratory: CTAB. No rales, rhonchi, or wheezing. On RA.  Abdomen: + bowel sounds, normoactive. No distention or tenderness.  GU: Not examined. +Foley, draining clear urine.  Skin: C/D/I. No apparent lesions.  Peripheral IVs intact.  MSK:      No apparent deformity.     Neurologic exam:  Cognition: AAO to person, place, time and event.  Complicated by lethargy. No dysarthria.   + Moderate cognitive delay,  complicated by medications Insight: Fair insight into current condition.  Mood: Pleasant affect, appropriate mood.  Sensation: Equal and intact in BL UE and Les.  Reflexes: 2+ in BL UE and LEs. Negative Hoffman's and babinski signs bilaterally.  CN: 2-12 grossly intact.  Coordination: No apparent tremors. No ataxia Spasticity: MAS 0 in all extremities.  Strength: Antigravity against resistance all 4 extremities, 4/5 throughout      Assessment/Plan: 1. Functional deficits which require 3+ hours per day of interdisciplinary therapy in a comprehensive inpatient rehab setting. Physiatrist is providing close team supervision and 24 hour management of active medical problems listed below. Physiatrist and rehab team continue to assess barriers to discharge/monitor patient progress toward functional and medical goals  Care Tool:  Bathing    Body parts bathed by patient: Right arm, Left arm, Chest, Abdomen, Front perineal area, Right upper leg, Left upper leg, Face, Left lower leg, Right lower leg   Body parts bathed by helper: Buttocks     Bathing assist Assist Level: Minimal Assistance - Patient > 75%     Upper Body Dressing/Undressing Upper body dressing   What is the patient wearing?: Pull over shirt    Upper body assist Assist Level: Minimal  Assistance - Patient > 75%    Lower Body Dressing/Undressing Lower body dressing      What is the patient wearing?: Pants, Underwear/pull up     Lower body assist Assist for lower body dressing: Minimal Assistance - Patient > 75%     Toileting Toileting    Toileting assist Assist for toileting: Moderate Assistance - Patient 50 - 74%     Transfers Chair/bed transfer  Transfers assist     Chair/bed transfer assist level: Minimal Assistance - Patient > 75%     Locomotion Ambulation   Ambulation assist      Assist level: Minimal Assistance - Patient > 75% Assistive device: No Device Max distance: 150'   Walk 10  feet activity   Assist     Assist level: Minimal Assistance - Patient > 75% Assistive device: No Device   Walk 50 feet activity   Assist    Assist level: Minimal Assistance - Patient > 75% Assistive device: No Device    Walk 150 feet activity   Assist    Assist level: Minimal Assistance - Patient > 75% Assistive device: No Device    Walk 10 feet on uneven surface  activity   Assist     Assist level: Minimal Assistance - Patient > 75%     Wheelchair     Assist Is the patient using a wheelchair?: Yes Type of Wheelchair: Manual    Wheelchair assist level: Dependent - Patient 0% Max wheelchair distance: 150'    Wheelchair 50 feet with 2 turns activity    Assist        Assist Level: Dependent - Patient 0%   Wheelchair 150 feet activity     Assist      Assist Level: Dependent - Patient 0%   Blood pressure (!) 144/64, pulse 88, temperature 98.9 F (37.2 C), temperature source Oral, resp. rate 18, height 5' 5 (1.651 m), weight 51.3 kg, SpO2 98%.  1. Functional deficits secondary to acute left pontine infarct             -patient may shower             -ELOS/Goals: S 7-8 days              - stable to continue CIR  2.  Antithrombotics: -DVT/anticoagulation:  Pharmaceutical: Heparin              -antiplatelet therapy: continue Aspirin     3. Pain Management: Tylenol  as needed 4. Mood/Behavior/Sleep: LCSW to follow for evaluation and support when available.              -antipsychotic agents: Seroquel               - Sleep aid: Melatonin scheduled 5. Neuropsych/cognition: This patient is capable of making decisions on her own behalf. 6. Skin/Wound Care: routine pressure relief measures  7. Fluids/Electrolytes/Nutrition: Monitor strict I&O's- follow-up chemistries             -Speech consult  -carb modified diet + boost--continue multivitamin, thiamine   - 8/21: Nutrition consult for poor PO intakes.  Complicated by nausea, may resume  Megace  3 times daily tomorrow if no improvements with nausea medication.  - 8/22: Nausea much better on current regimen, however p.o. intakes remain poor.  Resume megace  200 mg suspension BID  8.  Severe sepsis secondary to complicated UTI: Klebsiella bacteremia             - On IV Rocephin ----transitioned to Keflex  500 mg  x 7 days today 8/20  9.  AKI-CKD stage IIIb: Renal function gradually improving, avoid nephrotoxic agents             - Foley catheter in place--begin voiding trial with PVR as renal function improves. (Ordered for 8/22- please check with family if they are ok for this as they were hesitant about removal)             - On lactated Ringer 's??  Continue   - 8/21: Switch from LR 70 cc/hr to 1/2 NS 50 cc/hr for maintenance fluids - monitor Cr daily.  Encouraging patient to drink more water .   - 8/22: BMP today to monitor--pending  10.  Acute urinary retention: Same as above, continue Flomax  and monitor strict I&O   - Voiding trial initiated 8-22  11.  Acute left pontine infarct versus artifactual finding on MRI: Continue aspirin  per neurology recommendation. 12.  T2DM: 8/18 A1c 6.6              - Monitor CBG ACHS and continue SSI--allow for permissive hyperglycemia              - Add Semglee  5U daily  Recent Labs    06/18/24 1614 06/18/24 2118 06/19/24 0533  GLUCAP 181* 185* 123*     - BG variable - monitor today, POs poor, lows 70s;   - BG improved; monitor with megace  initiation and PO intakes above  13. HLD: continue atorvastatin  40 mg daily   14.  HTN: Monitor BP per protocol--Lasix/HCTZ on hold starting low-dose metoprolol  25 mg twice daily for mild sinus tachycardia- discussed with Dr. Raenelle who feels tachycardia is physiologic, no hold parameters for therapy recommended             - Monitor for orthostatic hypotension--stable this AM   - 8/22: Mild HTN, trend, may need to add ACE/ARB pending renal fxn above - would not add diuretic while on continuous IVF       06/19/2024    5:13 AM 06/18/2024    6:00 PM 06/18/2024    1:23 PM  Vitals with BMI  Weight 113 lbs 2 oz    BMI 18.82    Systolic 144 128 858  Diastolic 64 51 71  Pulse 88 109 108   15. Transaminitis - elevated ALP. Will get GGT as add-on given other LFTs WNL.    - 8-22: GGT 79; likely related to cholestasis. With ongoing nausea will get abdominal US  for workup.   16.  Central nausea versus central vertigo.   - Oversedated with Compazine , will schedule Zofran  4 mg 3 times daily head of meals/therapies to see if this improves symptoms   - Keep IV Compazine  and secondary for breakthrough   - No nystagmus on exam, but given location of stroke likely contribution of vertigo; will add as needed meclizine  12.5 mg 3 times daily. May need scopolamine patch    - 8/22: No vomitting reported; tolerating therapies much better.  Continue current regimen.  LOS: 2 days A FACE TO FACE EVALUATION WAS PERFORMED  Joesph JAYSON Likes 06/19/2024, 10:10 AM

## 2024-06-19 NOTE — Progress Notes (Signed)
 Brief Nephrology Note  We were asked to follow labs with transfer to CIR. Crt consistently improving. We will stop following labs which can be obtained 1-2x weekly per primary team discretion. Feel free to re-consult if needed.

## 2024-06-19 NOTE — Care Management (Signed)
 Inpatient Rehabilitation Center Individual Statement of Services  Patient Name:  Sara [name]  Date:  06/19/2024  Welcome to the Inpatient Rehabilitation Center.  Our goal is to provide you with an individualized program based on your diagnosis and situation, designed to meet your specific needs.  With this comprehensive rehabilitation program, you will be expected to participate in at least 3 hours of rehabilitation therapies Monday-Friday, with modified therapy programming on the weekends.  Your rehabilitation program will include the following services:  Physical Therapy (PT), Occupational Therapy (OT), Speech Therapy (ST), 24 hour per day rehabilitation nursing, Therapeutic Recreaction (TR), Psychology, Neuropsychology, Care Coordinator, Rehabilitation Medicine, Nutrition Services, Pharmacy Services, and Other  Weekly team conferences will be held on Tuesday to discuss your progress.  Your Inpatient Rehabilitation Care Coordinator will talk with you frequently to get your input and to update you on team discussions.  Team conferences with you and your family in attendance may also be held.  Expected length of stay: 7-10 days    Overall anticipated outcome: Supervision  Depending on your progress and recovery, your program may change. Your Inpatient Rehabilitation Care Coordinator will coordinate services and will keep you informed of any changes. Your Inpatient Rehabilitation Care Coordinator's name and contact numbers are listed  below.  The following services may also be recommended but are not provided by the Inpatient Rehabilitation Center:  Driving Evaluations Home Health Rehabiltiation Services Outpatient Rehabilitation Services Vocational Rehabilitation   Arrangements will be made to provide these services after discharge if needed.  Arrangements include referral to agencies that provide these services.  Your insurance has been verified to be:  [facility] Medicare  Your primary  doctor is:  Damian Christians  Pertinent information will be shared with your doctor and your insurance company.  Inpatient Rehabilitation Care Coordinator:  Graeme Feliciana SILK [phone] or ([phone]  Information discussed with and copy given to patient by: Graeme DELENA Feliciana, 06/19/2024, 9:48 AM

## 2024-06-19 NOTE — Progress Notes (Signed)
 Speech Language Pathology Daily Session Note  Patient Details  Name: Sara [name] MRN: [id number] Date of Birth: [birth year]  Today's Date: 06/19/2024 SLP Individual Time: 1100-1200 SLP Individual Time Calculation (min): 60 min  Short Term Goals: Week 1: SLP Short Term Goal 1 (Week 1): STGs = LTGs d/t ELOS  Skilled Therapeutic Interventions:   Pt and son greeted at bedside. She was up in her [facility] and very pleasant/cooperative throughout tx tasks targeting cognitive-linguistic goals. Pt was within 3 days of current date, though SLP provided calendar to assist w/ orientation to time. Pt completed mildly responsive naming task (animals) w/ modA. She required multiple repetitions and additional processing time throughout tx task. Cues required d/t semantic and phonemic responses. She requested assistance to the restroom, but was unsuccessful at urination attempt. She required only s cues for sequencing and problem solving during toileting and transfer to her recliner. She required only minA physical assistance w/ the use of her RW. She demonstrated adequate safety awareness throughout. She also benefited from s cues for problem solving when changing her TV channel to watch requested soap opera. Of note, she independently recalled the correct channel to navigate to. At the end of tx tasks, she was left in her chair w/ the alarm set and call light within reach. Recommend cont ST per POC.    Pain Pain Assessment Pain Scale: 0-10 Pain Score: 0-No pain  Therapy/Group: Individual Therapy  Sara [name] 06/19/2024, 11:36 AM

## 2024-06-19 NOTE — Progress Notes (Signed)
 Occupational Therapy Session Note  Patient Details  Name: Sara [name] MRN: [id number] Date of Birth: [birth year]  Today's Date: 06/19/2024 OT Individual Time: [id number] OT Individual Time Calculation (min): 57 min    Short Term Goals: Week 1:  OT Short Term Goal 1 (Week 1): STG=LTG d/t ELOS   Skilled Therapeutic Interventions/Progress Updates:    Pt bedlevel at time of session, no pain. Nursing present at beginning of session removing Foley and assisting with IV. Pt doffing clothing and performing UB/LB bathing at sink level seated and standing (stating she does this at home too in between showers) with SBA overall. UB dress set up, LB dress new brief and pants with MIN A 2/2 gripper socks getting stuck on clothing and putting both feet in one pant leg, needing cues to problem solve. Stand pivot bed > chair SBA, ambulated to bathroom CGA with RW with one instance of MIN A with sway. 3/3 toileting tasks with CGA, note attempted to void but unable. Sink level oral hygiene and facewashing with Supervision. Set up in wheelchair alarm on call bell in reach all needs met.   Therapy Documentation Precautions:  Precautions Precautions: Fall Restrictions Weight Bearing Restrictions Per Provider Order: No     Therapy/Group: Individual Therapy  Sara [name] 06/19/2024, 7:20 AM

## 2024-06-19 NOTE — Progress Notes (Signed)
 Physical Therapy Session Note  Patient Details  Name: Sara [name] MRN: [id number] Date of Birth: 02-04-1941  Today's Date: 06/19/2024 PT Individual Time: 0930-1015 PT Individual Time Calculation (min): 45 min   Short Term Goals: Week 1:  PT Short Term Goal 1 (Week 1): STGs = LTGs Week 2:     Skilled Therapeutic Interventions/Progress Updates:    Today's treatment focused on improvement of  prolonged ambulation, dynamic balance with reaching tasks, obstacle clearance, and step ambulation. This was accomplished using the exercises and transfers listed below. Pt showed  great tolerance to administered treatment with no adverse effects by the end of session. Skilled intervention was utilized via activity modification for pt tolerance with task completion, functional progression/regression promoting best outcomes inline with current rehab goals, as well as minimal verbal/tactile cuing. Continue with therapeutic focus on stair ambulation, test ambulatory progression without FWW, obstacle clearance, and dynamic balance. Upon exit pt was left in w/c alarm was set, call bell and all necessities were in reach.  Exercises: Hurdles in // 6 runners step in // SLS w/ 1xUE a. PRN Dynamic reach beyond BOS for clip on basketball hoop Ambulation w/ FWW  Transfers: STS: CGA w/ FWW Stand pivot transfer w/FWW; CGA  Therapy Documentation Precautions:  Precautions Precautions: Fall Restrictions Weight Bearing Restrictions Per Provider Order: No  Pain: Pain Assessment Pain Scale: 0-10 Pain Score: 0-No pain   Therapy/Group: Individual Therapy  Mabel Kiang, PT, DPT 06/19/2024, 12:53 PM

## 2024-06-19 NOTE — Progress Notes (Signed)
 Patient ID: Sara [name], female   DOB: 20-Sep-1941, 83 y.o.   MRN: [id number]  1032- SW met with pt and children- [place] and [name] to inform on short ELOS and discussed scheduling fam edu. Fam edu on Tues 1pm-4pmSABRA Graeme Jude, MSW, LCSW Office: [phone] Cell: [phone] Fax: [phone]

## 2024-06-20 LAB — GLUCOSE, CAPILLARY
Glucose-Capillary: 92 mg/dL (ref 70–99)
Glucose-Capillary: 229 mg/dL — ABNORMAL HIGH (ref 70–99)
Glucose-Capillary: 94 mg/dL (ref 70–99)
Glucose-Capillary: 126 mg/dL — ABNORMAL HIGH (ref 70–99)

## 2024-06-20 LAB — URINALYSIS, ROUTINE W REFLEX MICROSCOPIC
Bilirubin Urine: NEGATIVE
Glucose, UA: 150 mg/dL — AB
Ketones, ur: NEGATIVE mg/dL
Nitrite: NEGATIVE
Protein, ur: 100 mg/dL — AB
RBC / HPF: >50 RBC/hpf (ref 0–5)
Specific Gravity, Urine: 1.009 (ref 1.005–1.030)
WBC, UA: >50 WBC/hpf (ref 0–5)
pH: 8 (ref 5.0–8.0)

## 2024-06-20 MED ORDER — DOXAZOSIN MESYLATE 1 MG PO TABS
1.0000 mg | ORAL_TABLET | Freq: Every day | ORAL | Status: DC
  Administered 2024-06-21 – 2024-06-26 (×6): 1 mg via ORAL
  Filled 2024-06-20 (×7): qty 1

## 2024-06-20 MED ORDER — SODIUM CHLORIDE 0.9 % IV BOLUS
500.0000 mL | Freq: Once | INTRAVENOUS | Status: AC
  Administered 2024-06-20: 500 mL via INTRAVENOUS

## 2024-06-20 MED ORDER — SILODOSIN 4 MG PO CAPS
4.0000 mg | ORAL_CAPSULE | Freq: Every day | ORAL | Status: DC
  Filled 2024-06-20: qty 1

## 2024-06-20 NOTE — Plan of Care (Signed)
  Problem: Consults Goal: RH GENERAL PATIENT EDUCATION Description: See Patient Education module for education specifics. Outcome: Progressing   Problem: RH BOWEL ELIMINATION Goal: RH STG MANAGE BOWEL WITH ASSISTANCE Description: STG Manage Bowel with supervision Assistance. Outcome: Progressing   Problem: RH BLADDER ELIMINATION Goal: RH STG MANAGE BLADDER WITH ASSISTANCE Description: STG Manage Bladder With  supervision Assistance Outcome: Progressing   Problem: RH SKIN INTEGRITY Goal: RH STG SKIN FREE OF INFECTION/BREAKDOWN Description: Manage skin free of infection with supervision assistance Outcome: Progressing   Problem: RH SAFETY Goal: RH STG ADHERE TO SAFETY PRECAUTIONS W/ASSISTANCE/DEVICE Description: STG Adhere to Safety Precautions With supervision Assistance/Device. Outcome: Progressing   Problem: RH PAIN MANAGEMENT Goal: RH STG PAIN MANAGED AT OR BELOW PT'S PAIN GOAL Description: <4 w/ prns Outcome: Progressing   Problem: RH KNOWLEDGE DEFICIT GENERAL Goal: RH STG INCREASE KNOWLEDGE OF SELF CARE AFTER HOSPITALIZATION Description: Manage increase knowledge of self care after hospitalization with supervision assistance from  children using educational materials provided Outcome: Progressing

## 2024-06-20 NOTE — Progress Notes (Addendum)
 PROGRESS NOTE   Subjective/Complaints:  No events overnight. Vitals significant for heart rate elevation in the low 100s, low diastolic blood pressure.  Systolics stable 120s to 130s.    06/20/2024    8:20 AM 06/20/2024    5:00 AM 06/19/2024    8:34 PM  Vitals with BMI  Weight  115 lbs 1 oz   BMI  19.15   Systolic 118  129  Diastolic 51  55  Pulse 103  102    Recent Labs    06/19/24 2158 06/20/24 0614 06/20/24 1208  GLUCAP 157* 92 229*    Labs yesterday significant for much improved BUN/creatinine; nephrology evaluated, with recommended changing frequency to twice weekly and signing off.  P.o. intakes improving, 50 to 60% of meals today. A few continent urinary movements today; got 2 straight caths overnight with some blood clots for 500 and 550.  Gross urine appears bloody as well. Last BM not recorded.  ROS: Denies fevers, chills,  abdominal pain, constipation, diarrhea, SOB, cough, chest pain, new weakness or paraesthesias.   + nausea + poor appetite--improved  Objective:   US  Abdomen Limited RUQ (LIVER/GB) Result Date: 06/19/2024 CLINICAL DATA:  [id number] Nausea AND vomiting [id number] EXAM: ULTRASOUND ABDOMEN LIMITED RIGHT UPPER QUADRANT COMPARISON:  CT abdomen 09/03/2022 FINDINGS: Gallbladder: No gallstones or wall thickening visualized. No sonographic Murphy sign noted by sonographer. Common bile duct: Diameter: 4 mm. Liver: Question slightly nodular hepatic contour. No focal lesion identified. Within normal limits in parenchymal echogenicity. Portal vein is patent on color Doppler imaging with normal direction of blood flow towards the liver. Other: Trace volume simple free fluid ascites. Right pleural effusion. IMPRESSION: 1. Question slightly nodular hepatic contour. 2. Trace volume simple free fluid ascites. 3. Right pleural effusion. Electronically Signed   By: Morgane  Naveau M.D.   On: 06/19/2024 21:06   Recent  Labs    06/18/24 0534  WBC 14.4*  HGB 8.9*  HCT 24.5*  PLT 240   Recent Labs    06/18/24 0534 06/19/24 1106  NA 140 135  K 3.4* 4.0  CL 97* 100  CO2 26 25  GLUCOSE 78 97  BUN 59* 51*  CREATININE 4.83* 3.67*  CALCIUM  8.7* 8.7*    Intake/Output Summary (Last 24 hours) at 06/20/2024 1343 Last data filed at 06/20/2024 1300 Gross per 24 hour  Intake 635 ml  Output 1623 ml  Net -988 ml        Physical Exam: Vital Signs Blood pressure (!) 118/51, pulse (!) 103, temperature 98.6 F (37 C), resp. rate 18, height 5' 5 (1.651 m), weight 52.2 kg, SpO2 98%.  Constitutional: No apparent distress. Appropriate appearance for age.  Laying in bed. HENT: No JVD. Neck Supple. Trachea midline. Atraumatic, normocephalic. Eyes: PERRLA. EOMI. Visual fields grossly intact.  No nystagmus Cardiovascular: RRR, no murmurs/rub/gallops.  1+ bilateral peripheral edema.  Unchanged. Respiratory: CTAB. No rales, rhonchi, or wheezing. On RA.  Abdomen: + bowel sounds, normoactive. No distention or tenderness.  Skin: C/D/I. No apparent lesions.  Peripheral IVs intact.  MSK:      No apparent deformity.     Neurologic exam:  Cognition: AAO to person, place, time  and event.   No dysarthria.   + Moderate cognitive delay  Insight: Fair insight into current condition.  Mood: Pleasant affect, appropriate mood.  Sensation: Equal and intact in BL UE and Les.  Reflexes: 2+ in BL UE and LEs. Negative Hoffman's and babinski signs bilaterally.  CN: 2-12 grossly intact.  Coordination: No apparent tremors. No ataxia Spasticity: MAS 0 in all extremities.  Strength: Antigravity against resistance all 4 extremities, 4/5 throughout    Physical exam unchanged from the above on reexamination 06/20/24    Assessment/Plan: 1. Functional deficits which require 3+ hours per day of interdisciplinary therapy in a comprehensive inpatient rehab setting. Physiatrist is providing close team supervision and 24 hour  management of active medical problems listed below. Physiatrist and rehab team continue to assess barriers to discharge/monitor patient progress toward functional and medical goals  Care Tool:  Bathing    Body parts bathed by patient: Right arm, Left arm, Chest, Abdomen, Front perineal area, Right upper leg, Left upper leg, Face, Left lower leg, Right lower leg, Buttocks   Body parts bathed by helper: Buttocks     Bathing assist Assist Level: Minimal Assistance - Patient > 75% (for balance and thoroughness)     Upper Body Dressing/Undressing Upper body dressing   What is the patient wearing?: Pull over shirt    Upper body assist Assist Level: Minimal Assistance - Patient > 75%    Lower Body Dressing/Undressing Lower body dressing      What is the patient wearing?: Pants, Underwear/pull up     Lower body assist Assist for lower body dressing: Minimal Assistance - Patient > 75%     Toileting Toileting    Toileting assist Assist for toileting: Moderate Assistance - Patient 50 - 74%     Transfers Chair/bed transfer  Transfers assist     Chair/bed transfer assist level: Contact Guard/Touching assist     Locomotion Ambulation   Ambulation assist      Assist level: Contact Guard/Touching assist Assistive device: No Device Max distance: 400'   Walk 10 feet activity   Assist     Assist level: Contact Guard/Touching assist Assistive device: Walker-rolling   Walk 50 feet activity   Assist    Assist level: Contact Guard/Touching assist Assistive device: Walker-rolling    Walk 150 feet activity   Assist    Assist level: Contact Guard/Touching assist Assistive device: Walker-rolling    Walk 10 feet on uneven surface  activity   Assist     Assist level: Minimal Assistance - Patient > 75%     Wheelchair     Assist Is the patient using a wheelchair?: Yes Type of Wheelchair: Manual    Wheelchair assist level: Dependent - Patient  0% Max wheelchair distance: 150'    Wheelchair 50 feet with 2 turns activity    Assist        Assist Level: Dependent - Patient 0%   Wheelchair 150 feet activity     Assist      Assist Level: Dependent - Patient 0%   Blood pressure (!) 118/51, pulse (!) 103, temperature 98.6 F (37 C), resp. rate 18, height 5' 5 (1.651 m), weight 52.2 kg, SpO2 98%.  1. Functional deficits secondary to acute left pontine infarct             -patient may shower             -ELOS/Goals: S 7-8 days              -  stable to continue CIR  2.  Antithrombotics: -DVT/anticoagulation:  Pharmaceutical: Heparin              -antiplatelet therapy: continue Aspirin     3. Pain Management: Tylenol  as needed 4. Mood/Behavior/Sleep: LCSW to follow for evaluation and support when available.              -antipsychotic agents: Seroquel               - Sleep aid: Melatonin scheduled 5. Neuropsych/cognition: This patient is capable of making decisions on her own behalf. 6. Skin/Wound Care: routine pressure relief measures  7. Fluids/Electrolytes/Nutrition: Monitor strict I&O's- follow-up chemistries             -Speech consult  -carb modified diet + boost--continue multivitamin, thiamine   - 8/21: Nutrition consult for poor PO intakes.  Complicated by nausea, may resume Megace  3 times daily tomorrow if no improvements with nausea medication.  - 8/22: Nausea much better on current regimen, however p.o. intakes remain poor.  Resume megace  200 mg suspension BID  8.  Severe sepsis secondary to complicated UTI: Klebsiella bacteremia             - On IV Rocephin ----transitioned to Keflex  500 mg x 7 days today 8/20  9.  AKI-CKD stage IIIb: Renal function gradually improving, avoid nephrotoxic agents             - Foley catheter in place--begin voiding trial with PVR as renal function improves. (Ordered for 8/22- please check with family if they are ok for this as they were hesitant about removal)              - On lactated Ringer 's??  Continue   - 8/21: Switch from LR 70 cc/hr to 1/2 NS 50 cc/hr for maintenance fluids - monitor Cr daily.  Encouraging patient to drink more water .   - 8/22: BMP today to monitor-much improved, stop maintenance fluids  8-23: P.o. intakes picking up, given I's and O's net negative greater than 1 L, continue to encourage p.o.'s. May need resumption of IVF.   10.  Acute urinary retention : Same as above, continue Flomax  and monitor strict I&O   - Voiding trial initiated 8-22  -8-23: Had 2 caths overnight, 500 cc each, is starting to void this a.m.  Having some bloody output, likely from traumatic caths. Timed Toiletting Q4H ordered.    - Get UA to eval. Already on Kelfex - finishing Monday.   11.  Acute left pontine infarct versus artifactual finding on MRI: Continue aspirin  per neurology recommendation. 12.  T2DM: 8/18 A1c 6.6              - Monitor CBG ACHS and continue SSI--allow for permissive hyperglycemia              - Add Semglee  5U daily--improved, monitor with improved p.o. intakes  Recent Labs    06/19/24 2158 06/20/24 0614 06/20/24 1208  GLUCAP 157* 92 229*     - BG variable - monitor today, POs poor, lows 70s;   - BG improved; monitor with megace  initiation and PO intakes above  13. HLD: continue atorvastatin  40 mg daily   14.  HTN/hypotension/tachycardia: Monitor BP per protocol--Lasix/HCTZ on hold starting low-dose metoprolol  25 mg twice daily for mild sinus tachycardia- discussed with Dr. Raenelle who feels tachycardia is physiologic, no hold parameters for therapy recommended             - Monitor for orthostatic hypotension--stable this AM   -  8/22: Mild HTN, trend, may need to add ACE/ARB pending renal fxn above - would not add diuretic while on continuous IVF  - 8-23: Hypotensive, tachycardic today.   Reduce rapiflo to 4 mg daily. Encourage PO fluids--500 cc bolus + TEDS, Binder ordered.       06/20/2024    8:20 AM 06/20/2024    5:00 AM  06/19/2024    8:34 PM  Vitals with BMI  Weight  115 lbs 1 oz   BMI  19.15   Systolic 118  129  Diastolic 51  55  Pulse 103  102   15. Transaminitis/chronic liver dz/ascites - elevated ALP. Will get GGT as add-on given other LFTs WNL.    - 8-22: GGT 79; likely related to cholestasis. With ongoing nausea will get abdominal US  for workup.    - 8.23: No gallstones or gallbladder dilation; notable R ascites w/ nodular liver and pleural effusion.    - Will need GI follow up for findings as OP  16.  Central nausea versus central vertigo.   - Oversedated with Compazine , will schedule Zofran  4 mg 3 times daily head of meals/therapies to see if this improves symptoms   - Keep IV Compazine  and secondary for breakthrough   - No nystagmus on exam, but given location of stroke likely contribution of vertigo; will add as needed meclizine  12.5 mg 3 times daily. May need scopolamine patch    - 8/22: No vomitting reported; tolerating therapies much better.  Continue current regimen--continued improvement  17. R pleural effusion - incidental on abdominal US . ISB Q2H while awake, Dced IVF, continuing to hold diuretices d/t low BP and elevated HR.      LOS: 3 days A FACE TO FACE EVALUATION WAS PERFORMED  Joesph JAYSON Likes 06/20/2024, 1:43 PM

## 2024-06-20 NOTE — Progress Notes (Signed)
 Physical Therapy Session Note  Patient Details  Name: Sara [name] MRN: [id number] Date of Birth: [birth year]  Today's Date: 06/20/2024 PT Individual Time: 0900-1010  PT Individual Time Calculation (min): 70 min  Short Term Goals: Week 1:  PT Short Term Goal 1 (Week 1): STGs = LTGs  Skilled Therapeutic Interventions/Progress Updates:  Chart reviewed and pt agreeable to therapy. Pt received seated in recliner with 0/10 c/o pain. Session focused on functional transfers, balance, and ambulation to promote safe home mobility and access. Pt taken to therapy gym for time management. Pt initiated session with amb of 250 ft using CGA + RW fading to supervision + RW with safe use of DME and no need of VC. Pt then completed blocked practice of stair navigation. Pt completed step up/downs with S + B hand rails. Pt then navigated 8 steps using CGA + B rails. With repeated practice pt faded to S + unilateral rail per home set up for 8 x 3 steps. Pt then taken  outside where pt navigated over uneven surfaces for >543ft using  S + RW. Pt then taken to room where pt completed amb to/from toilet and recliner using S + RW and pt require S for peri-care. At end of session, pt was left seated in recliner with alarm engaged, nurse call bell and all needs in reach.     Therapy Documentation Precautions:  Precautions Precautions: Fall Restrictions Weight Bearing Restrictions Per Provider Order: No General:      Therapy/Group: Individual Therapy   Sara [name] 06/20/2024, 10:56 AM

## 2024-06-20 NOTE — Plan of Care (Signed)
  Problem: Consults Goal: RH GENERAL PATIENT EDUCATION Description: See Patient Education module for education specifics. Outcome: Progressing   Problem: RH BLADDER ELIMINATION Goal: RH STG MANAGE BLADDER WITH ASSISTANCE Description: STG Manage Bladder With  supervision Assistance Outcome: Progressing   Problem: RH PAIN MANAGEMENT Goal: RH STG PAIN MANAGED AT OR BELOW PT'S PAIN GOAL Description: <4 w/ prns Outcome: Progressing

## 2024-06-20 NOTE — Progress Notes (Signed)
 Physical Therapy Session Note  Patient Details  Name: Sara [name] MRN: [id number] Date of Birth: 12/09/1940  Today's Date: 06/20/2024 PT Individual Time: 1120-1200 PT Individual Time Calculation (min): 40 min   Short Term Goals: Week 1:  PT Short Term Goal 1 (Week 1): STGs = LTGs  Skilled Therapeutic Interventions/Progress Updates: Patient sitting in recliner on entrance to room. Patient alert and agreeable to PT session.   Patient reported no pain during session.   Therapeutic Activity: Transfers: Pt performed sit<>stand transfers throughout session with supervision for safety with/without RW. Pt ambulated from room<>day room gym in RW with supervision in order to improve household/community ambulatory tolerance.   Neuromuscular Re-ed: NMR facilitated during session with focus on dynamic standing balance, coordination. - Cone taps with B LE's with light minA at first then progressed to CGA for safety. - pt ambulating around nsg/day room loop without AD and light CGA. Pt progressed to holding tidal tank in B UE's with VC to keep water  as level as possible with brief moments of slight unsteadiness to the L but pt able to maintains standing balance without intervention. - Pt performed step ups to 4 step with tidal tank in B UE's with overall CGA. 2 rounds performed till close to fatigue with rest break required.  NMR performed for improvements in motor control and coordination, balance, sequencing, judgement, and self confidence/ efficacy in performing all aspects of mobility at highest level of independence.   Patient sitting in recliner at end of session with brakes locked, family present, belt alarm set, and all needs within reach.      Therapy Documentation Precautions:  Precautions Precautions: Fall Restrictions Weight Bearing Restrictions Per Provider Order: No  Therapy/Group: Individual Therapy  Rheanna Sergent PTA 06/20/2024, 12:10 PM

## 2024-06-20 NOTE — Progress Notes (Signed)
 Occupational Therapy Session Note  Patient Details  Name: Sara [name] MRN: [id number] Date of Birth: [birth year]  Today's Date: 06/20/2024 OT Individual Time: 1300-1410 OT Individual Time Calculation (min): 70 min    Short Term Goals: Week 1:  OT Short Term Goal 1 (Week 1): STG=LTG d/t ELOS  Skilled Therapeutic Interventions/Progress Updates: Patient seen for functional mobility and family training activities. Patient agreeable to participate and motivated to work on IADL's for the home. Ambulated with RW CGA/SBA from room to ADL kitchen to work on dynamic standing balance reaching to varied heights. Patient with rollator and feels good about sitting on seat to reach lower shelves and using the seat/basket to transport items when making breakfast or heating up lunch. Patient lives with daughter and another daughter lives close by but was home independently prior. Continued treatment session with training on use of tub bench for safe transfer. Patient was able to return demo and scoot into tub with CGA and vc's. Will benefit from continued practice to ensure carryover. Discussed bathroom set up and family states they will be close by when patient is bathing and getting in and out of the shower. Continued treatment in therapy gym working on UE strength and endurance performing 2 x 10 UE there ex with use of 2 lb dowel. Good tolerance and task performance following demonstration. Concluded treatment with fall recovery training. Patient able to transition from EOM to high kneel to sitting on the mat with Min assist and transitioned back from side sitting to high kneel to sitting on the EOM using chair with arms for support and min/Mod therapist assist. Patient reports she does not get on the floor any more after having difficulty getting up several years ago. Once back in the patient's room cleared patient's daughter for simple transfer assist and assist walking patient to the bathroom. Good family  involvement and patient participation throughout treatment session.     Therapy Documentation Precautions:  Precautions Precautions: Fall Restrictions Weight Bearing Restrictions Per Provider Order: No    Pain:no c/o pain     Therapy/Group: Individual Therapy  [name] 06/20/2024, 2:15 PM

## 2024-06-21 LAB — GLUCOSE, CAPILLARY
Glucose-Capillary: 92 mg/dL (ref 70–99)
Glucose-Capillary: 146 mg/dL — ABNORMAL HIGH (ref 70–99)
Glucose-Capillary: 172 mg/dL — ABNORMAL HIGH (ref 70–99)
Glucose-Capillary: 200 mg/dL — ABNORMAL HIGH (ref 70–99)

## 2024-06-21 MED ORDER — SODIUM CHLORIDE 0.9 % IV SOLN
1.0000 g | INTRAVENOUS | Status: AC
  Administered 2024-06-22 – 2024-06-25 (×4): 1 g via INTRAVENOUS
  Filled 2024-06-21 (×4): qty 10

## 2024-06-21 MED ORDER — BETHANECHOL CHLORIDE 10 MG PO TABS
5.0000 mg | ORAL_TABLET | Freq: Three times a day (TID) | ORAL | Status: DC
  Administered 2024-06-22 – 2024-06-23 (×4): 5 mg via ORAL
  Filled 2024-06-21 (×4): qty 1

## 2024-06-21 NOTE — Progress Notes (Signed)
 Speech Language Pathology Daily Session Note  Patient Details  Name: Sara [name] MRN: [id number] Date of Birth: 05-24-1941  Today's Date: 06/21/2024 SLP Individual Time: 0700-0745 SLP Individual Time Calculation (min): 45 min  Short Term Goals: Week 1: SLP Short Term Goal 1 (Week 1): STGs = LTGs d/t ELOS  Skilled Therapeutic Interventions:   Pt was seen for skilled ST services targeting language and cognition. Pt was alert and agreeable to therapy this morning. Pt was oriented x3, just two days off of date. SLP directed pt to calendar on wall. Pt was unable to recall what she was working on in speech therapy - SLP reviewed goals with pt. Pt completed divergent naming task with modA verbal cues re: subcategories for initially provided category, to generate 10 items in a basic category (I.e. fruits, vegetables).  During a separate naming task targeting sustained attention and recall, pt required overall mod-maxA verbal cues/prompts to complete. SLP instructed pt to generate a food for a given letter of the alphabet. Pt required mod-maxA to re: recall the letter of the alphabet we were targeting and generate a food. As the task progressed, pt experienced more difficulty 2/2 to suspected mental fatigue. Pt was left in room, with all immediate needs within reach, and safety measures activated. Continue with current ST POC.   Pain Pain Assessment Pain Score: 0-No pain  Therapy/Group: Individual Therapy  [name] 06/21/2024, 7:27 AM

## 2024-06-21 NOTE — Plan of Care (Signed)
  Problem: Consults Goal: RH GENERAL PATIENT EDUCATION Description: See Patient Education module for education specifics. Outcome: Progressing   Problem: RH SAFETY Goal: RH STG ADHERE TO SAFETY PRECAUTIONS W/ASSISTANCE/DEVICE Description: STG Adhere to Safety Precautions With supervision Assistance/Device. Outcome: Progressing   Problem: RH PAIN MANAGEMENT Goal: RH STG PAIN MANAGED AT OR BELOW PT'S PAIN GOAL Description: <4 w/ prns Outcome: Progressing

## 2024-06-21 NOTE — Progress Notes (Signed)
 PROGRESS NOTE   Subjective/Complaints:  No events overnight.  Vitals are stable. P.o. intakes improved, 50 to 60% of most meals.  Continues with urinary retention, unfortunately still taking pills crushed contraindicating Flomax .  Had prior been ordered Rapaflo  4 mg, but apparently pharmacy was not stocking this, started on Cardura  just this morning.  Patient has no complaints today.   ROS: Denies fevers, chills,  abdominal pain, constipation, diarrhea, SOB, cough, chest pain, new weakness or paraesthesias.   + nausea--resolved + poor appetite--improved + Urinary retention  Objective:   No results found.  No results for input(s): WBC, HGB, HCT, PLT in the last 72 hours.  Recent Labs    06/19/24 1106  NA 135  K 4.0  CL 100  CO2 25  GLUCOSE 97  BUN 51*  CREATININE 3.67*  CALCIUM  8.7*    Intake/Output Summary (Last 24 hours) at 06/21/2024 2205 Last data filed at 06/21/2024 1852 Gross per 24 hour  Intake 118 ml  Output 1554 ml  Net -1436 ml        Physical Exam: Vital Signs Blood pressure (!) 151/60, pulse (!) 102, temperature 97.6 F (36.4 C), resp. rate 18, height 5' 5 (1.651 m), weight 50.1 kg, SpO2 100%.  Constitutional: No apparent distress. Appropriate appearance for age.  Sitting up in bedside chair. HENT: No JVD. Neck Supple. Trachea midline. Atraumatic, normocephalic. Eyes: PERRLA. EOMI. Visual fields grossly intact.  No nystagmus Cardiovascular: RRR, no murmurs/rub/gallops.  Trace + bilateral peripheral edema.  Respiratory: CTAB. No rales, rhonchi, or wheezing. On RA.  Abdomen: + bowel sounds, normoactive. No distention or tenderness.  Skin: C/D/I. No apparent lesions.  Peripheral IVs intact.  MSK:      No apparent deformity.     Neurologic exam:  Cognition: AAO to person, place, time and event.   No dysarthria.   + Moderate cognitive delay  + Moderate memory deficits Insight:  Fair insight into current condition.  Mood: Pleasant affect, appropriate mood.  Sensation: Equal and intact in BL UE and Les.  Reflexes: 2+ in BL UE and LEs. Negative Hoffman's and babinski signs bilaterally.  CN: 2-12 grossly intact.  Coordination: No apparent tremors. No ataxia Spasticity: MAS 0 in all extremities.  Strength: Antigravity against resistance all 4 extremities, 4/5 throughout    Physical exam unchanged from the above on reexamination 06/21/24    Assessment/Plan: 1. Functional deficits which require 3+ hours per day of interdisciplinary therapy in a comprehensive inpatient rehab setting. Physiatrist is providing close team supervision and 24 hour management of active medical problems listed below. Physiatrist and rehab team continue to assess barriers to discharge/monitor patient progress toward functional and medical goals  Care Tool:  Bathing    Body parts bathed by patient: Right arm, Left arm, Chest, Abdomen, Front perineal area, Right upper leg, Left upper leg, Face, Left lower leg, Right lower leg, Buttocks   Body parts bathed by helper: Buttocks     Bathing assist Assist Level: Minimal Assistance - Patient > 75% (for balance and thoroughness)     Upper Body Dressing/Undressing Upper body dressing   What is the patient wearing?: Pull over shirt    Upper body  assist Assist Level: Minimal Assistance - Patient > 75%    Lower Body Dressing/Undressing Lower body dressing      What is the patient wearing?: Pants, Underwear/pull up     Lower body assist Assist for lower body dressing: Minimal Assistance - Patient > 75%     Toileting Toileting    Toileting assist Assist for toileting: Moderate Assistance - Patient 50 - 74%     Transfers Chair/bed transfer  Transfers assist     Chair/bed transfer assist level: Contact Guard/Touching assist     Locomotion Ambulation   Ambulation assist      Assist level: Contact Guard/Touching  assist Assistive device: No Device Max distance: 400'   Walk 10 feet activity   Assist     Assist level: Contact Guard/Touching assist Assistive device: Walker-rolling   Walk 50 feet activity   Assist    Assist level: Contact Guard/Touching assist Assistive device: Walker-rolling    Walk 150 feet activity   Assist    Assist level: Contact Guard/Touching assist Assistive device: Walker-rolling    Walk 10 feet on uneven surface  activity   Assist     Assist level: Minimal Assistance - Patient > 75%     Wheelchair     Assist Is the patient using a wheelchair?: Yes Type of Wheelchair: Manual    Wheelchair assist level: Dependent - Patient 0% Max wheelchair distance: 150'    Wheelchair 50 feet with 2 turns activity    Assist        Assist Level: Dependent - Patient 0%   Wheelchair 150 feet activity     Assist      Assist Level: Dependent - Patient 0%   Blood pressure (!) 151/60, pulse (!) 102, temperature 97.6 F (36.4 C), resp. rate 18, height 5' 5 (1.651 m), weight 50.1 kg, SpO2 100%.  1. Functional deficits secondary to acute left pontine infarct             -patient may shower             -ELOS/Goals: S 7-8 days              - stable to continue CIR  2.  Antithrombotics: -DVT/anticoagulation:  Pharmaceutical: Heparin              -antiplatelet therapy: continue Aspirin     3. Pain Management: Tylenol  as needed 4. Mood/Behavior/Sleep: LCSW to follow for evaluation and support when available.              -antipsychotic agents: Seroquel               - Sleep aid: Melatonin scheduled 5. Neuropsych/cognition: This patient is capable of making decisions on her own behalf. 6. Skin/Wound Care: routine pressure relief measures  7. Fluids/Electrolytes/Nutrition: Monitor strict I&O's- follow-up chemistries             -Speech consult  -carb modified diet + boost--continue multivitamin, thiamine   - 8/21: Nutrition consult for poor PO  intakes.  Complicated by nausea, may resume Megace  3 times daily tomorrow if no improvements with nausea medication.  - 8/22: Nausea much better on current regimen, however p.o. intakes remain poor.  Resume megace  200 mg suspension BID - 8-24: P.o. is much improved with Megace , continue current regimen  8.  Severe sepsis secondary to complicated UTI: Klebsiella bacteremia             - On IV Rocephin ----transitioned to Keflex  500 mg x 7 days today 8/20 -  tolerating   High 9.  AKI-CKD stage IIIb: Renal function gradually improving, avoid nephrotoxic agents             - Foley catheter in place--begin voiding trial with PVR as renal function improves. (Ordered for 8/22- please check with family if they are ok for this as they were hesitant about removal)             - On lactated Ringer 's??  Continue   - 8/21: Switch from LR 70 cc/hr to 1/2 NS 50 cc/hr for maintenance fluids - monitor Cr daily.  Encouraging patient to drink more water .   - 8/22: BMP today to monitor-much improved, stop maintenance fluids  8-23: P.o. intakes picking up, given I's and O's net negative greater than 1 L, continue to encourage p.o.'s. May need resumption of IVF.   8-23: UA remains positive.  Patient still having urinary retention.  Will go back to IV Rocephin  1 g daily and extend course through 10 days  10.  Acute urinary retention : Same as above, continue Flomax  and monitor strict I&O   - Voiding trial initiated 8-22  -8-23: Had 2 caths overnight, 500 cc each, is starting to void this a.m.  Having some bloody output, likely from traumatic caths. Timed Toiletting Q4H ordered.    - Get UA to eval. Already on Kelfex - finishing Monday.  8-24: UA remains positive.  Pharmacy does not have Rapaflo  4 mg tabs, started Cardura  1 mg daily today (pharmacy alternative to Flomax  given pills are crushed).   Continue timed toileting, ISC, UTI treatment as above.    --On evening chart review, continues with intermittent straight  cathing, will start bethanechol  5 mg 3 times daily for a.m.  11.  Acute left pontine infarct versus artifactual finding on MRI: Continue aspirin  per neurology recommendation. 12.  T2DM: 8/18 A1c 6.6              - Monitor CBG ACHS and continue SSI--allow for permissive hyperglycemia              - Add Semglee  5U daily--improved, monitor with improved p.o. intakes  Recent Labs    06/21/24 1204 06/21/24 1630 06/21/24 2046  GLUCAP 146* 172* 200*     -    - BG improved; monitor with megace  initiation and PO intakes above  13. HLD: continue atorvastatin  40 mg daily   14.  HTN/hypotension/tachycardia: Monitor BP per protocol--Lasix/HCTZ on hold starting low-dose metoprolol  25 mg twice daily for mild sinus tachycardia- discussed with Dr. Raenelle who feels tachycardia is physiologic, no hold parameters for therapy recommended             - Monitor for orthostatic hypotension--stable this AM   - 8/22: Mild HTN, trend, may need to add ACE/ARB pending renal fxn above - would not add diuretic while on continuous IVF  - 8-23: Hypotensive, tachycardic today.   Reduce rapiflo to 4 mg daily. Encourage PO fluids--500 cc bolus + TEDS, Binder ordered.   8-24: Per pharmacy, do not stock 4 mg tab.  Changed to Cardura  as above.  BP tolerating.      06/21/2024    8:44 PM 06/21/2024    4:31 PM 06/21/2024    5:09 AM  Vitals with BMI  Systolic 151 125 864  Diastolic 60 49 61  Pulse 102 91 84   15. Transaminitis/chronic liver dz/ascites - elevated ALP. Will get GGT as add-on given other LFTs WNL.    - 8-22: GGT  79; likely related to cholestasis. With ongoing nausea will get abdominal US  for workup.    - 8.23: No gallstones or gallbladder dilation; notable R ascites w/ nodular liver and pleural effusion.  Did discuss results with patient and family.   - Will need GI follow up for findings as OP  16.  Central nausea versus central vertigo.   - Oversedated with Compazine , will schedule Zofran  4 mg 3 times  daily head of meals/therapies to see if this improves symptoms   - Keep IV Compazine  and secondary for breakthrough   - No nystagmus on exam, but given location of stroke likely contribution of vertigo; will add as needed meclizine  12.5 mg 3 times daily. May need scopolamine patch    - 8/22: No vomitting reported; tolerating therapies much better.  Continue current regimen--continued improvement  17. R pleural effusion - incidental on abdominal US . ISB Q2H while awake, Dced IVF, continuing to hold diuretices d/t low BP and elevated HR.      LOS: 4 days A FACE TO FACE EVALUATION WAS PERFORMED  Joesph JAYSON Likes 06/21/2024, 10:05 PM

## 2024-06-22 LAB — BASIC METABOLIC PANEL WITH GFR
Anion gap: 4 — ABNORMAL LOW (ref 5–15)
BUN: 49 mg/dL — ABNORMAL HIGH (ref 8–23)
CO2: 23 mmol/L (ref 22–32)
Calcium: 8.7 mg/dL — ABNORMAL LOW (ref 8.9–10.3)
Chloride: 106 mmol/L (ref 98–111)
Creatinine, Ser: 3.68 mg/dL — ABNORMAL HIGH (ref 0.44–1.00)
GFR, Estimated: 12 mL/min — ABNORMAL LOW (ref >60)
Glucose, Bld: 95 mg/dL (ref 70–99)
Potassium: 5.3 mmol/L — ABNORMAL HIGH (ref 3.5–5.1)
Sodium: 133 mmol/L — ABNORMAL LOW (ref 135–145)

## 2024-06-22 LAB — CBC
HCT: 21 % — ABNORMAL LOW (ref 36.0–46.0)
Hemoglobin: 7.2 g/dL — ABNORMAL LOW (ref 12.0–15.0)
MCH: 26.5 pg (ref 26.0–34.0)
MCHC: 34.3 g/dL (ref 30.0–36.0)
MCV: 77.2 fL — ABNORMAL LOW (ref 80.0–100.0)
Platelets: 361 K/uL (ref 150–400)
RBC: 2.72 MIL/uL — ABNORMAL LOW (ref 3.87–5.11)
RDW: 14.9 % (ref 11.5–15.5)
WBC: 12.1 K/uL — ABNORMAL HIGH (ref 4.0–10.5)
nRBC: 0 % (ref 0.0–0.2)

## 2024-06-22 LAB — GLUCOSE, CAPILLARY
Glucose-Capillary: 96 mg/dL (ref 70–99)
Glucose-Capillary: 205 mg/dL — ABNORMAL HIGH (ref 70–99)
Glucose-Capillary: 112 mg/dL — ABNORMAL HIGH (ref 70–99)
Glucose-Capillary: 206 mg/dL — ABNORMAL HIGH (ref 70–99)

## 2024-06-22 LAB — OCCULT BLOOD X 1 CARD TO LAB, STOOL: Fecal Occult Bld: NEGATIVE

## 2024-06-22 NOTE — Progress Notes (Signed)
 Speech Language Pathology Daily Session Note  Patient Details  Name: Sara [name] MRN: [id number] Date of Birth: [birth year]  Today's Date: 06/22/2024 SLP Individual Time: 8582-8551 SLP Individual Time Calculation (min): 31 min  Short Term Goals: Week 1: SLP Short Term Goal 1 (Week 1): STGs = LTGs d/t ELOS  Skilled Therapeutic Interventions:  Patient was seen in PM to address cognitive re- training and word finding. Pt alert and seen in her room with NSG staff present assisting with personal care. Once settled pt agreeable for session. She was indep oriented to temporal, spatial, and situational concepts. SLP engaged pt in a structured task targeting use of circumlocution strategies. Pt completed task of using descriptive strategies to communicate an unknown topic given mod I. Pt subsequently challenged in use of strategies at conversational level. She completed task with 1 instance of anomia where she indep utilized compensatory strategies to reduce breakdown. Pt able to attend throughout tasks appropriately with mod I. Pt left upright in recliner at conclusion of session with call button within reach. SLP to continue POC.  Pain Pain Assessment Pain Scale: 0-10 Pain Score: 7  Pain Location: Head Pain Intervention(s): Medication (See eMAR)  Therapy/Group: Individual Therapy  [name] 06/22/2024, 2:55 PM

## 2024-06-22 NOTE — Progress Notes (Signed)
 Occupational Therapy Session Note  Patient Details  Name: Sara [name] MRN: [id number] Date of Birth: August 21, 1941  Today's Date: 06/22/2024 OT Individual Time: 9196-9095 OT Individual Time Calculation (min): 61 min    Short Term Goals: Week 1:  OT Short Term Goal 1 (Week 1): STG=LTG d/t ELOS  Skilled Therapeutic Interventions/Progress Updates:  Skilled OT session completed to address ADL retraining and functional mobility. Pt received up in recliner with nursing, agreeable to participate in therapy. BP monitored throughout session. Pt reports no pain.   Pt ambulated with RW chair>TTB with CGA for RW safety, VC and tactile cues to move RE safely in bathroom. Pt completed UB/LB bathing in shower with Supervision d/t intermitting standing during task. Education provided on shower safety within the home to reduce risk for falls, pt verbalized understanding. UB/LB dressing performed with CGA to stand without RW during task. Ambulated with RW to sink to perform oral care in standing with supervision. Ambulated to recliner chair to monitor BP and don shoes seated. Education provided on LB dressing seated in the home.    Pt ambulated ~164ft with CGA using no AD. OT provided VC to increase step length and reduce shuffling gait. Encouraged for seated rest break d/t gait pattern. Educated on walking speed and technique for stepping with demo, pt displayed understanding by return demo ambulating +139ft CGA with no AD with better technique. No LOB noted.     Pt returned to room in recliner and all needs met.   Therapy Documentation Precautions:  Precautions Precautions: Fall Restrictions Weight Bearing Restrictions Per Provider Order: No  Vital Signs: BP: 92/76 Patient Position: Sitting at Recliner  BP: 133/58 Patient Position: Seated at TTB prior to shower BP: 144/51 Patient Position: Seated at Recliner post ambulation ending session  Therapy/Group: Individual Therapy  Esmeralda Blanford  Woods-Chance, MS, OTR/L 06/22/2024, 8:45 AM

## 2024-06-22 NOTE — Plan of Care (Signed)
  Problem: Consults Goal: RH GENERAL PATIENT EDUCATION Description: See Patient Education module for education specifics. Outcome: Progressing   Problem: RH SAFETY Goal: RH STG ADHERE TO SAFETY PRECAUTIONS W/ASSISTANCE/DEVICE Description: STG Adhere to Safety Precautions With supervision Assistance/Device. Outcome: Progressing   Problem: RH PAIN MANAGEMENT Goal: RH STG PAIN MANAGED AT OR BELOW PT'S PAIN GOAL Description: <4 w/ prns Outcome: Progressing

## 2024-06-22 NOTE — Progress Notes (Signed)
 Speech Language Pathology Daily Session Note  Patient Details  Name: Sara [name] MRN: [id number] Date of Birth: 1941-10-17  Today's Date: 06/22/2024 SLP Individual Time: 0900-0957 SLP Individual Time Calculation (min): 57 min  Short Term Goals: Week 1: SLP Short Term Goal 1 (Week 1): STGs = LTGs d/t ELOS  Skilled Therapeutic Interventions: Skilled therapy session focused on cognitive and communication goals. At the beginning of the session, patient recalled information regarding self, family and job. Patient utilizing word finding strategies at the conversational level given supervision A. SLP targeted communication through structured responsive naming task. Patient required minA to name items with 90% accuracy and maintain sustained attention throughout. At the end of the session, SLP educated patient on WRAP (write, repeat, associate picture) strategies and provided examples of each. Patient left in chair with alarm set and call bell in reach. Continue POC.  Pain Denies  Therapy/Group: Individual Therapy  Monik Lins M.A., CCC-SLP 06/22/2024, 7:44 AM

## 2024-06-22 NOTE — Progress Notes (Signed)
 Physical Therapy Session Note  Patient Details  Name: Sara [name] MRN: [id number] Date of Birth: [birth year]  Today's Date: 06/22/2024 PT Individual Time: 1008-1103 PT Individual Time Calculation (min): 55 min   Short Term Goals: Week 1:  PT Short Term Goal 1 (Week 1): STGs = LTGs  Skilled Therapeutic Interventions/Progress Updates:    Pt presents in room in recliner motivated to participate with PT. Pt denies pain. Session focused on NMR for dynamic standing balance, single limb stability, dual tasking as well as therapeutic activities for tolerance to upright. Pt completes transfers without device with CGA/close supervision throughout session. Pt ambulates without device to day room with supervision. Pt completes NMR with agility ladder for dynamic functional balance and BLE coordinatoin including: - forward gait (one foot in each square) - side stepping Pt with difficulty maintaining big steps for activity, completes with CGA and mod/max verbal cues for sequencing desired step mechanics. Pt then completes NMR for single limb stability, dynamic standing balance, cognitive dual tasking, multidirectional stepping stability, BUE/BLE coordination including: - step taps 4 step x20 alternating BLE - step ups 4 step x10 BLE - forward step and back x10 BLE - lateral step and back x10 BLE - backwards step and back x10 BLE - heel/toe raises x10 (difficulty elevating toes however does demonstrate weight shift at hips, no LOB) - self ball toss two hand toss and catch seated and standing x20 each (cues for increasing height of toss for increasing BUE arm swing) - ambulating 150' with two hand toss and catch self ball toss Pt returns to room and remains seated in recliner with all needs within reach, cal light in place and chair alarm donned and activated at end of session.    Therapy Documentation Precautions:  Precautions Precautions: Fall Restrictions Weight Bearing Restrictions Per  Provider Order: No    Therapy/Group: Individual Therapy  Reche Ohara PT, DPT 06/22/2024, 11:03 AM

## 2024-06-22 NOTE — Progress Notes (Signed)
 PROGRESS NOTE   Subjective/Complaints: No new complaints this morning Denies pain Discussed that Hgb dropped to 7.2, will obtain stool occult and order repeat for tomorrow  Vitals stable     06/22/2024   10:28 AM 06/22/2024    8:15 AM 06/22/2024    6:00 AM  Vitals with BMI  Weight   112 lbs 10 oz  BMI   18.75  Systolic 138 92 148  Diastolic 60 76 77  Pulse 91 106 98    Recent Labs    06/21/24 1630 06/21/24 2046 06/22/24 0603  GLUCAP 172* 200* 96     ROS: Denies fevers, chills, N/V, abdominal pain, constipation, diarrhea, SOB, cough, chest pain, new weakness or paraesthesias.    Objective:   No results found. Recent Labs    06/22/24 0513  WBC 12.1*  HGB 7.2*  HCT 21.0*  PLT 361   Recent Labs    06/19/24 1106 06/22/24 0513  NA 135 133*  K 4.0 5.3*  CL 100 106  CO2 25 23  GLUCOSE 97 95  BUN 51* 49*  CREATININE 3.67* 3.68*  CALCIUM  8.7* 8.7*    Intake/Output Summary (Last 24 hours) at 06/22/2024 1036 Last data filed at 06/22/2024 0609 Gross per 24 hour  Intake --  Output 2050 ml  Net -2050 ml        Physical Exam: Vital Signs Blood pressure 138/60, pulse 91, temperature 98.6 F (37 C), temperature source Oral, resp. rate 18, height 5' 5 (1.651 m), weight 51.1 kg, SpO2 100%.  Constitutional: No apparent distress. Appropriate appearance for age.  Laying in bed. HENT: No JVD. Neck Supple. Trachea midline. Atraumatic, normocephalic. Eyes: PERRLA. EOMI. Visual fields grossly intact.  No nystagmus on EOMI. Cardiovascular: RRR, no murmurs/rub/gallops.  1+ bilateral peripheral edema.  Peripheral pulses 2+. Respiratory: CTAB. No rales, rhonchi, or wheezing. On RA.  Abdomen: + bowel sounds, normoactive. No distention or tenderness.  GU: Not examined. +Foley, draining clear urine.  Skin: C/D/I. No apparent lesions.  Peripheral IVs intact.  MSK:      No apparent deformity.     Neurologic exam:   Cognition: AAO to person, place, time and event.  Complicated by lethargy. Language: Fluent, No substitutions or neoglisms.  Mild dysarthria.   Memory: Moderate cognitive delay, complicated by medications Insight: Fair insight into current condition.  Mood: Pleasant affect, appropriate mood.  Sensation: Equal and intact in BL UE and Les.  Reflexes: 2+ in BL UE and LEs. Negative Hoffman's and babinski signs bilaterally.  CN: 2-12 grossly intact.  Coordination: No apparent tremors. No ataxia on FTN, HTS bilaterally.  Spasticity: MAS 0 in all extremities.  Strength: Antigravity against resistance all 4 extremities in bed. Stable 8/25       Assessment/Plan: 1. Functional deficits which require 3+ hours per day of interdisciplinary therapy in a comprehensive inpatient rehab setting. Physiatrist is providing close team supervision and 24 hour management of active medical problems listed below. Physiatrist and rehab team continue to assess barriers to discharge/monitor patient progress toward functional and medical goals  Care Tool:  Bathing    Body parts bathed by patient: Right arm, Left arm, Chest, Abdomen, Front perineal  area, Right upper leg, Left upper leg, Face, Left lower leg, Right lower leg, Buttocks   Body parts bathed by helper: Buttocks     Bathing assist Assist Level: Minimal Assistance - Patient > 75% (for balance and thoroughness)     Upper Body Dressing/Undressing Upper body dressing   What is the patient wearing?: Pull over shirt    Upper body assist Assist Level: Minimal Assistance - Patient > 75%    Lower Body Dressing/Undressing Lower body dressing      What is the patient wearing?: Pants, Underwear/pull up     Lower body assist Assist for lower body dressing: Minimal Assistance - Patient > 75%     Toileting Toileting    Toileting assist Assist for toileting: Moderate Assistance - Patient 50 - 74%     Transfers Chair/bed transfer  Transfers  assist     Chair/bed transfer assist level: Contact Guard/Touching assist     Locomotion Ambulation   Ambulation assist      Assist level: Contact Guard/Touching assist Assistive device: No Device Max distance: 400'   Walk 10 feet activity   Assist     Assist level: Contact Guard/Touching assist Assistive device: Walker-rolling   Walk 50 feet activity   Assist    Assist level: Contact Guard/Touching assist Assistive device: Walker-rolling    Walk 150 feet activity   Assist    Assist level: Contact Guard/Touching assist Assistive device: Walker-rolling    Walk 10 feet on uneven surface  activity   Assist     Assist level: Minimal Assistance - Patient > 75%     Wheelchair     Assist Is the patient using a wheelchair?: Yes Type of Wheelchair: Manual    Wheelchair assist level: Dependent - Patient 0% Max wheelchair distance: 150'    Wheelchair 50 feet with 2 turns activity    Assist        Assist Level: Dependent - Patient 0%   Wheelchair 150 feet activity     Assist      Assist Level: Dependent - Patient 0%   Blood pressure 138/60, pulse 91, temperature 98.6 F (37 C), temperature source Oral, resp. rate 18, height 5' 5 (1.651 m), weight 51.1 kg, SpO2 100%.  1. Functional deficits secondary to acute left pontine infarct             -patient may shower             -ELOS/Goals: S 7-8 days             Continue CIR  2.  Antithrombotics: -DVT/anticoagulation:  Pharmaceutical: Heparin              -antiplatelet therapy: continue Aspirin     3. Pain Management: Tylenol  as needed 4. Mood/Behavior/Sleep: LCSW to follow for evaluation and support when available.              -antipsychotic agents: Seroquel               - Sleep aid: Melatonin scheduled 5. Neuropsych/cognition: This patient is capable of making decisions on her own behalf. 6. Skin/Wound Care: routine pressure relief measures  7. Fluids/Electrolytes/Nutrition:  Monitor strict I&O's- follow-up chemistries             -Speech consult  -carb modified diet + boost--continue multivitamin, thiamine   - 8/21: Nutrition consult for poor PO intakes.  Complicated by nausea, may resume Megace  3 times daily tomorrow if no improvements with nausea medication.  8.  Severe sepsis  secondary to complicated UTI: Klebsiella bacteremia             - On IV Rocephin ----transitioned to Keflex  500 mg x 7 days 8/20  9.  AKI-CKD stage IIIb: Renal function gradually improving, avoid nephrotoxic agents             - Foley catheter in place--begin voiding trial with PVR as renal function improves. (Ordered for 8/22- please check with family if they are ok for this as they were hesitant about removal)             - On lactated Ringer 's??  Continue   - 8/21: Switch from LR 70 cc/hr to 1/2 NS 50 cc/hr for maintenance fluids - monitor Cr daily.  Encouraging patient to drink more water .  10.  Acute urinary retention: Same as above, continue Flomax  and monitor strict I&O   11.  Acute left pontine infarct versus artifactual finding on MRI: conitnue aspirin  per neurology recommendation.  12.  T2DM: 8/18 A1c 6.6              - Monitor CBG ACHS and continue SSI--allow for permissive hyperglycemia              -continue Semglee  5U daily  Recent Labs    06/21/24 1630 06/21/24 2046 06/22/24 0603  GLUCAP 172* 200* 96     - BG variable - monitor today, POs poor, lows 70s;   13. HLD: continue atorvastatin  40 mg daily   14.  HTN: Monitor BP per protocol--Lasix/HCTZ on hold starting low-dose metoprolol  25 mg twice daily for mild sinus tachycardia- discussed with Dr. Raenelle who feels tachycardia is physiologic, no hold parameters for therapy recommended             - Monitor for orthostatic hypotension--stable this AM      06/22/2024   10:28 AM 06/22/2024    8:15 AM 06/22/2024    6:00 AM  Vitals with BMI  Weight   112 lbs 10 oz  BMI   18.75  Systolic 138 92 148  Diastolic 60 76 77   Pulse 91 106 98   15. Transaminitis - elevated ALP. Will get GGT as add-on given other LFTs WNL.   16.  Central nausea versus central vertigo.   - Oversedated with Compazine , will schedule Zofran  4 mg 3 times daily head of meals/therapies to see if this improves symptoms   - Keep IV Compazine  and secondary for breakthrough   - No nystagmus on exam, but given location of stroke likely contribution of vertigo; will add as needed meclizine  12.5 mg 3 times daily. May need scopolamine patch   LOS: 5 days A FACE TO FACE EVALUATION WAS PERFORMED  Sven SQUIBB Dalia Jollie 06/22/2024, 10:36 AM

## 2024-06-22 NOTE — Plan of Care (Signed)
  Problem: Consults Goal: RH GENERAL PATIENT EDUCATION Description: See Patient Education module for education specifics. Outcome: Progressing   Problem: RH BOWEL ELIMINATION Goal: RH STG MANAGE BOWEL WITH ASSISTANCE Description: STG Manage Bowel with supervision Assistance. Outcome: Progressing   Problem: RH BLADDER ELIMINATION Goal: RH STG MANAGE BLADDER WITH ASSISTANCE Description: STG Manage Bladder With  supervision Assistance Outcome: Progressing   Problem: RH SKIN INTEGRITY Goal: RH STG SKIN FREE OF INFECTION/BREAKDOWN Description: Manage skin free of infection with supervision assistance Outcome: Progressing   Problem: RH SAFETY Goal: RH STG ADHERE TO SAFETY PRECAUTIONS W/ASSISTANCE/DEVICE Description: STG Adhere to Safety Precautions With supervision Assistance/Device. Outcome: Progressing   Problem: RH KNOWLEDGE DEFICIT GENERAL Goal: RH STG INCREASE KNOWLEDGE OF SELF CARE AFTER HOSPITALIZATION Description: Manage increase knowledge of self care after hospitalization with supervision assistance from  children using educational materials provided Outcome: Progressing

## 2024-06-23 LAB — IRON AND TIBC
Iron: 22 ug/dL — ABNORMAL LOW (ref 28–170)
Saturation Ratios: 9 % — ABNORMAL LOW (ref 10.4–31.8)
TIBC: 255 ug/dL (ref 250–450)
UIBC: 233 ug/dL

## 2024-06-23 LAB — CBC WITH DIFFERENTIAL/PLATELET
Abs Immature Granulocytes: 0.04 K/uL (ref 0.00–0.07)
Basophils Absolute: 0.1 K/uL (ref 0.0–0.1)
Basophils Relative: 1 %
Eosinophils Absolute: 0.3 K/uL (ref 0.0–0.5)
Eosinophils Relative: 3 %
HCT: 21 % — ABNORMAL LOW (ref 36.0–46.0)
Hemoglobin: 6.9 g/dL — CL (ref 12.0–15.0)
Immature Granulocytes: 0 %
Lymphocytes Relative: 31 %
Lymphs Abs: 3.2 K/uL (ref 0.7–4.0)
MCH: 25.9 pg — ABNORMAL LOW (ref 26.0–34.0)
MCHC: 32.9 g/dL (ref 30.0–36.0)
MCV: 78.9 fL — ABNORMAL LOW (ref 80.0–100.0)
Monocytes Absolute: 0.8 K/uL (ref 0.1–1.0)
Monocytes Relative: 8 %
Neutro Abs: 5.9 K/uL (ref 1.7–7.7)
Neutrophils Relative %: 57 %
Platelets: 423 K/uL — ABNORMAL HIGH (ref 150–400)
RBC: 2.66 MIL/uL — ABNORMAL LOW (ref 3.87–5.11)
RDW: 15.5 % (ref 11.5–15.5)
WBC: 10.4 K/uL (ref 4.0–10.5)
nRBC: 0 % (ref 0.0–0.2)

## 2024-06-23 LAB — ABO/RH: ABO/RH(D): A POS

## 2024-06-23 LAB — GLUCOSE, CAPILLARY
Glucose-Capillary: 88 mg/dL (ref 70–99)
Glucose-Capillary: 179 mg/dL — ABNORMAL HIGH (ref 70–99)
Glucose-Capillary: 147 mg/dL — ABNORMAL HIGH (ref 70–99)
Glucose-Capillary: 175 mg/dL — ABNORMAL HIGH (ref 70–99)

## 2024-06-23 LAB — FERRITIN: Ferritin: 133 ng/mL (ref 11–307)

## 2024-06-23 LAB — PREPARE RBC (CROSSMATCH)

## 2024-06-23 MED ORDER — SENNOSIDES-DOCUSATE SODIUM 8.6-50 MG PO TABS
2.0000 | ORAL_TABLET | Freq: Two times a day (BID) | ORAL | Status: DC
  Administered 2024-06-23 – 2024-06-30 (×11): 2 via ORAL
  Filled 2024-06-23 (×11): qty 2

## 2024-06-23 MED ORDER — POLYETHYLENE GLYCOL 3350 17 G PO PACK
17.0000 g | PACK | Freq: Every day | ORAL | Status: DC
  Administered 2024-06-23: 17 g via ORAL
  Filled 2024-06-23 (×2): qty 1

## 2024-06-23 MED ORDER — BETHANECHOL CHLORIDE 10 MG PO TABS
10.0000 mg | ORAL_TABLET | Freq: Three times a day (TID) | ORAL | Status: DC
  Administered 2024-06-23 – 2024-06-26 (×9): 10 mg via ORAL
  Filled 2024-06-23 (×10): qty 1

## 2024-06-23 MED ORDER — FERROUS SULFATE 325 (65 FE) MG PO TABS
325.0000 mg | ORAL_TABLET | Freq: Every day | ORAL | Status: DC
  Administered 2024-06-24 – 2024-06-30 (×7): 325 mg via ORAL
  Filled 2024-06-23 (×7): qty 1

## 2024-06-23 MED ORDER — SODIUM CHLORIDE 0.9% IV SOLUTION: Freq: Once | INTRAVENOUS | Status: DC

## 2024-06-23 NOTE — Discharge Instructions (Addendum)
 Inpatient Rehab Discharge Instructions  Sara [name] Discharge date and time: 06/30/2024  Activities/Precautions/ Functional Status: Activity: no lifting, driving, or strenuous exercise for until cleared by provider Diet: cardiac diet and low fat, low cholesterol diet Wound Care: none needed Functional status:  ___ No restrictions     ___ Walk up steps independently ___ 24/7 supervision/assistance   ___ Walk up steps with assistance ___ Intermittent supervision/assistance  ___ Bathe/dress independently _X__ Walk with walker     ___ Bathe/dress with assistance ___ Walk Independently    ___ Shower independently ___ Walk with assistance    ___ Shower with assistance __X_ No alcohol      ___ Return to work/school ________  Special Instructions:  -No driving smoking or alcohol  or illicit drug use   -Foley catheter care instructions provided with Urology follow up.   -Follow up with [facility] within 1-2 weeks.    My questions have been answered and I understand these instructions. I will adhere to these goals and the provided educational materials after my discharge from the hospital.  Patient/Caregiver Signature _______________________________ Date __________  Clinician Signature _______________________________________ Date __________  Please bring this form and your medication list with you to all your follow-up doctor's appointments.      COMMUNITY REFERRALS UPON DISCHARGE:    Outpatient: PT      OT     ST              Agency:[facility] [facility]   Phone:[phone]              Appointment Date/Time:*Please expect follow-up within 7-10 business days to schedule your appointment. If you have not received follow-up, be sure to contact the site directly.*

## 2024-06-23 NOTE — Progress Notes (Signed)
 Patient ID: Sara [name], female   DOB: April 07, 1941, 83 y.o.   MRN: [id number]  SW met with pt and pt son [name] in room to provide updates from team conference, and d/c date 8/30.SW will come back and try and meet with pt dtr [name] as well.   *SW came back and pt dtr left after fam edu completed. SW will call her.   1557- SW spoke with tp dtr [name] to discuss above. Preferred outpatient location- [facility]. SW sent order to [facility].   Graeme Jude, MSW, LCSW Office: [phone] Cell: [phone] Fax: [phone]

## 2024-06-23 NOTE — Progress Notes (Signed)
 Speech Language Pathology Daily Session Note  Patient Details  Name: Sara [name] MRN: [id number] Date of Birth: 11-21-1940  Today's Date: 06/23/2024 SLP Individual Time: 1415-1445 SLP Individual Time Calculation (min): 30 min  Short Term Goals: Week 1: SLP Short Term Goal 1 (Week 1): STGs = LTGs d/t ELOS  Skilled Therapeutic Interventions:   Pt and family greeted at bedside. SLP facilitated tx tasks targeting cognition and dysphagia goals as well as family education. She benefited from s cues to utilize her calendar to recall specific date. Remaining orientation info was accurate. She also benefited from minA cues to recall events of the last few days. She required only s cues to ID memory aid provided for WRAP memory strategies to recall these. The pt and her son verbalized understanding of education re functional ways to utilize these strategies in the home environment. Her son also noted adequate swallow function at this time, but presented questions re maintaining current success in the home environment. Pharyngeal strengthening exercises x2 were introduced: masako and CTAR. She required maxA cues to attempt masako and was only able to complete 2 reps before task was d/c d/t time constraints. She benefited from minA cues to ensure adequate technique during 10 isokinetic reps of the CTAR. Will review in upcoming tx sessions to ensure adequate completion in the home environment. At the end of tx tasks, she was left in her recliner w/ the call light nearby. Recommend cont ST per POC.   Pain  No pain reported  Therapy/Group: Individual Therapy  [name] 06/23/2024, 2:39 PM

## 2024-06-23 NOTE — Progress Notes (Signed)
 Occupational Therapy Session Note  Patient Details  Name: Sara [name] MRN: [id number] Date of Birth: 12/16/1940  Session 1 Today's Date: 06/23/2024 OT Individual Time: 8890-8793 OT Individual Time Calculation (min): 57 min    Session 2 Today's Date: 06/23/2024 OT Individual Time: 8694-8654 OT Individual Time Calculation (min): 40 min    Short Term Goals: Week 1:  OT Short Term Goal 1 (Week 1): STG=LTG d/t ELOS  Skilled Therapeutic Interventions/Progress Updates:    Session 1 Pt received sitting up in the recliner with no c/o pain, agreeable to OT session. Discussed low HgB and taking multiple rest breaks in session until she receives unit of blood. She completed functional mobility with the rollator to the therapy gym with CGA, 150 ft. She then completed endurance and dynamic balance focused obstacle course, holding 4 lb dumbbells bilaterally and stepping onto/over several obstacles. She completed 4 repetitions with rest breaks between with CGA provided. She then completed forward, lateral and backward stepping activity using a cross to provide visual/tactile cue of need to complete large stepping pattern. Activity performed to challenge stepping righting reactions and carryover to ADL transfers ,like the shower. She required min cueing and CGA, often requiring multiple steps to clear threshold. She then completed 200 ft of functional mobility to the tub room with the rollator- requiring min cueing for proximity to rollator but CGA overall. She completed tub transfer in standing, utilizing grab bar to simulate home environment. Min cueing for positioning, she was able to complete this transfer with CGA. She then completed dynamic balance activity using the BITS- on the dynamic platform (similar to BOSU), dynamic weight shifting forward/backward, and R/L to address ankle righting reactions and trunk control, all for reducing fall risk. She required cueing for weight shifting and processing  task, as well as min A. She returned to her room following. She was left sitting up in the recliner with all needs met, son present.   Session 2 Pt received sitting up in the recliner with no c/o pain, agreeable to OT session. Son and two daughters present for family education session. Verbal education provided re fall risk reduction, energy conservation strategies, home carryover of transfer training, ADLs, and IADLs. Demonstration and hands on training completed for pt performance of UB/LB bathing and dressing at (S) level, toileting hygiene and transfers, and shower transfers. Provided education and demonstration on DME use recommendations at home- reviewed TTB vs shower chair. Pt completed TTB transfer and family agreed this was the best option- they privately ordered this themselves. Discussed need for BSC to increase safety and independence with night time toileting. Provided general shower safety including placing bar soap on a rope or switching to liquid soap. Pt performed 300 ft of functional mobility with the rollator during session requiring min cueing for brake management and maintaining close proximity to rollator. Reviewed CVA risk and BE FAST acronym. Pt returned to her room and left sitting up in the recliner with all needs met.     Therapy Documentation Precautions:  Precautions Precautions: Fall Restrictions Weight Bearing Restrictions Per Provider Order: No Therapy/Group: Individual Therapy  [name] 06/23/2024, 7:50 AM

## 2024-06-23 NOTE — Progress Notes (Addendum)
 PROGRESS NOTE   Subjective/Complaints:  No events overnight.  Vitals are stable. HA persistent per nursing with tylenol .  Continues to need ISC overnight  Had BM today, small, with enema.  Per patient and son, had very large bowel movement yesterday, feels she emptied appropriately.  Hemoglobin downtrending with IV antibiotics, 7.2 yesterday, 6.9 today.  Likely dilutional, discussed with son at bedside and patient, agreeable to 1 unit PRBCs today.  Will also check iron 5, repeat CBC in AM.  FOBT was negative.  ROS: Denies fevers, chills,  abdominal pain, constipation, diarrhea, SOB, cough, chest pain, new weakness or paraesthesias.   + nausea--resolved + poor appetite--improved overall, remains difficult + Urinary retention--ongoing + Constipation--improving  Objective:   No results found.  Recent Labs    06/22/24 0513 06/23/24 0454  WBC 12.1* 10.4  HGB 7.2* 6.9*  HCT 21.0* 21.0*  PLT 361 423*    Recent Labs    06/22/24 0513  NA 133*  K 5.3*  CL 106  CO2 23  GLUCOSE 95  BUN 49*  CREATININE 3.68*  CALCIUM  8.7*    Intake/Output Summary (Last 24 hours) at 06/23/2024 1022 Last data filed at 06/23/2024 0939 Gross per 24 hour  Intake 290 ml  Output 1400 ml  Net -1110 ml        Physical Exam: Vital Signs Blood pressure (!) 126/52, pulse 97, temperature 98.9 F (37.2 C), temperature source Oral, resp. rate 16, height 5' 5 (1.651 m), weight 50.1 kg, SpO2 100%.  Constitutional: No apparent distress. Appropriate appearance for age.  Ambulating in therapy gym. HENT: No JVD. Neck Supple. Trachea midline. Atraumatic, normocephalic. Eyes: PERRLA. EOMI. Visual fields grossly intact.  No nystagmus Cardiovascular: RRR, no murmurs/rub/gallops.  No bilateral peripheral edema.  Respiratory: CTAB. No rales, rhonchi, or wheezing. On RA.  Abdomen: + bowel sounds, normoactive. No distention or tenderness.  Skin: C/D/I.  No apparent lesions.  MSK:      No apparent deformity.     Neurologic exam:  Cognition: AAO to person, place, time and event.   No dysarthria.   + Moderate cognitive delay  + Moderate memory deficits  Insight: Fair insight into current condition.  Mood: Pleasant affect, appropriate mood.  Sensation: Equal and intact in BL UE and Les.  Reflexes: 2+ in BL UE and LEs. Negative Hoffman's and babinski signs bilaterally.  CN: 2-12 grossly intact.  Coordination: No apparent tremors. No ataxia.  Some difficulty with advanced balance exercises but overall within functional limits. Spasticity: MAS 0 in all extremities.  Strength: Antigravity against resistance all 4 extremities, 4/5 throughout Gait: Contact-guard to min assist with rolling walker.  Assessment/Plan: 1. Functional deficits which require 3+ hours per day of interdisciplinary therapy in a comprehensive inpatient rehab setting. Physiatrist is providing close team supervision and 24 hour management of active medical problems listed below. Physiatrist and rehab team continue to assess barriers to discharge/monitor patient progress toward functional and medical goals  Care Tool:  Bathing    Body parts bathed by patient: Right arm, Left arm, Chest, Abdomen, Front perineal area, Right upper leg, Left upper leg, Face, Left lower leg, Right lower leg, Buttocks   Body parts  bathed by helper: Buttocks     Bathing assist Assist Level: Minimal Assistance - Patient > 75% (for balance and thoroughness)     Upper Body Dressing/Undressing Upper body dressing   What is the patient wearing?: Pull over shirt    Upper body assist Assist Level: Minimal Assistance - Patient > 75%    Lower Body Dressing/Undressing Lower body dressing      What is the patient wearing?: Pants, Underwear/pull up     Lower body assist Assist for lower body dressing: Minimal Assistance - Patient > 75%     Toileting Toileting    Toileting assist  Assist for toileting: Moderate Assistance - Patient 50 - 74%     Transfers Chair/bed transfer  Transfers assist     Chair/bed transfer assist level: Contact Guard/Touching assist     Locomotion Ambulation   Ambulation assist      Assist level: Contact Guard/Touching assist Assistive device: No Device Max distance: 400'   Walk 10 feet activity   Assist     Assist level: Contact Guard/Touching assist Assistive device: Walker-rolling   Walk 50 feet activity   Assist    Assist level: Contact Guard/Touching assist Assistive device: Walker-rolling    Walk 150 feet activity   Assist    Assist level: Contact Guard/Touching assist Assistive device: Walker-rolling    Walk 10 feet on uneven surface  activity   Assist     Assist level: Minimal Assistance - Patient > 75%     Wheelchair     Assist Is the patient using a wheelchair?: Yes Type of Wheelchair: Manual    Wheelchair assist level: Dependent - Patient 0% Max wheelchair distance: 150'    Wheelchair 50 feet with 2 turns activity    Assist        Assist Level: Dependent - Patient 0%   Wheelchair 150 feet activity     Assist      Assist Level: Dependent - Patient 0%   Blood pressure (!) 126/52, pulse 97, temperature 98.9 F (37.2 C), temperature source Oral, resp. rate 16, height 5' 5 (1.651 m), weight 50.1 kg, SpO2 100%.  1. Functional deficits secondary to acute left pontine infarct             -patient may shower             -ELOS/Goals: S 7-8 days - 8/30 DC              - stable to continue CIR   - 8/25: Needs to be independent at home - was supervision at baseline for cog. Making good progress with therapies.   2.  Antithrombotics: -DVT/anticoagulation:  Pharmaceutical: Heparin              -antiplatelet therapy: continue Aspirin     3. Pain Management: Tylenol  as needed 4. Mood/Behavior/Sleep: LCSW to follow for evaluation and support when available.               -antipsychotic agents: Seroquel               - Sleep aid: Melatonin scheduled 5. Neuropsych/cognition: This patient is capable of making decisions on her own behalf. 6. Skin/Wound Care: routine pressure relief measures  7. Fluids/Electrolytes/Nutrition: Monitor strict I&O's- follow-up chemistries             -Speech consult  -carb modified diet + boost--continue multivitamin, thiamine   - 8/21: Nutrition consult for poor PO intakes.  Complicated by nausea, may resume Megace  3 times daily  tomorrow if no improvements with nausea medication.  - 8/22: Nausea much better on current regimen, however p.o. intakes remain poor.  Resume megace  200 mg suspension BID - 8-24: P.o. is much improved with Megace , continue current regimen--have slowed down a little  8.  Severe sepsis secondary to complicated UTI: Klebsiella bacteremia             - On IV Rocephin ----transitioned to Keflex  500 mg x 7 days today 8/20 -tolerating  8-23: UA remains positive.  Patient still having urinary retention.  Will go back to IV Rocephin  1 g daily and extend course through 10 days   - 8-26: CBC improved.  Continues with retention, see below.  9.  AKI-CKD stage IIIb: Renal function gradually improving, avoid nephrotoxic agents             - Foley catheter in place--begin voiding trial with PVR as renal function improves. (Ordered for 8/22- please check with family if they are ok for this as they were hesitant about removal)             - On lactated Ringer 's??  Continue   - 8/21: Switch from LR 70 cc/hr to 1/2 NS 50 cc/hr for maintenance fluids - monitor Cr daily.  Encouraging patient to drink more water .   - 8/22: BMP today to monitor-much improved, stop maintenance fluids  8-23: P.o. intakes picking up, given I's and O's net negative greater than 1 L, continue to encourage p.o.'s. May need resumption of IVF.   8-24: Creatinine stable off of IV fluids.  Has been getting some with IV antibiotics.  Continue to monitor   10.   Acute urinary retention : Same as above, continue Flomax  and monitor strict I&O   - Voiding trial initiated 8-22  -8-23: Had 2 caths overnight, 500 cc each, is starting to void this a.m.  Having some bloody output, likely from traumatic caths. Timed Toiletting Q4H ordered.    - Get UA to eval. Already on Kelfex - finishing Monday.  8-24: UA remains positive.  Pharmacy does not have Rapaflo  4 mg tabs, started Cardura  1 mg daily today (pharmacy alternative to Flomax  given pills are crushed).   Continue timed toileting, ISC, UTI treatment as above.    --On evening chart review, continues with intermittent straight cathing, will start bethanechol  5 mg 3 times daily for a.m. 8/26: Increase bethenachol to 10 mg TID.  Tolerating Cardura .   11.  Acute left pontine infarct versus artifactual finding on MRI: Continue aspirin  per neurology recommendation.  12.  T2DM: 8/18 A1c 6.6              - Monitor CBG ACHS and continue SSI--allow for permissive hyperglycemia              - Add Semglee  5U daily--improved, monitor with improved p.o. intakes  Recent Labs    06/22/24 1700 06/22/24 2054 06/23/24 0551  GLUCAP 112* 206* 88     -    - BG improved; monitor with megace  initiation and PO intakes above  - 8/26: Has stayed in a relatively good range, 6.6 at goal for age, will DC insulin .  Continue to monitor twice daily.  13. HLD: continue atorvastatin  40 mg daily   14.  HTN/hypotension/tachycardia/Grade 1 diastolic HF: Monitor BP per protocol--Lasix/HCTZ on hold starting low-dose metoprolol  25 mg twice daily for mild sinus tachycardia- discussed with Dr. Raenelle who feels tachycardia is physiologic, no hold parameters for therapy recommended             -  Monitor for orthostatic hypotension--stable this AM   - 8/22: Mild HTN, trend, may need to add ACE/ARB pending renal fxn above - would not add diuretic while on continuous IVF  - 8-23: Hypotensive, tachycardic today.   Reduce rapiflo to 4 mg daily.  Encourage PO fluids--500 cc bolus + TEDS, Binder ordered.   8-24: Per pharmacy, do not stock 4 mg tab.  Changed to Cardura  as above.  BP tolerating. Weights stable      06/23/2024    7:36 AM 06/23/2024    5:42 AM 06/23/2024    4:50 AM  Vitals with BMI  Weight  110 lbs 7 oz 112 lbs 10 oz  BMI  18.38 18.75  Systolic 126    Diastolic 52    Pulse 97     Filed Weights   06/22/24 0600 06/23/24 0450 06/23/24 0542  Weight: 51.1 kg 51.1 kg 50.1 kg    15. Transaminitis/chronic liver dz/ascites - elevated ALP. Will get GGT as add-on given other LFTs WNL.    - 8-22: GGT 79; likely related to cholestasis. With ongoing nausea will get abdominal US  for workup.    - 8.23: No gallstones or gallbladder dilation; notable R ascites w/ nodular liver and pleural effusion.  Did discuss results with patient and family.   - Will need GI follow up for findings as OP  16.  Central nausea versus central vertigo.   - Oversedated with Compazine , will schedule Zofran  4 mg 3 times daily head of meals/therapies to see if this improves symptoms   - Keep IV Compazine  and secondary for breakthrough   - No nystagmus on exam, but given location of stroke likely contribution of vertigo; will add as needed meclizine  12.5 mg 3 times daily. May need scopolamine patch    - 8/22: No vomitting reported; tolerating therapies much better.  Continue current regimen--continued improvement   -Much improved/resolved  17. R pleural effusion - incidental on abdominal US . ISB Q2H while awake, Dced IVF, continuing to hold diuretices d/t low BP and elevated HR.   18. Anemia.  Iron deficiency based on panel, likely contributed to dilution from antibiotics.  - FOBT negative - Added on Iron studies today--sat 9, iron 22.  Getting IV PRBCs today, would benefit from Venofer once completed.  Start daily iron supplement.  - Will get 1 U PRBC today; repeat h/h tomorrow   LOS: 6 days A FACE TO FACE EVALUATION WAS PERFORMED  Sara [name] Likes 06/23/2024, 10:22 AM

## 2024-06-23 NOTE — Progress Notes (Signed)
 Physical Therapy Session Note  Patient Details  Name: Sara [name] MRN: [id number] Date of Birth: 20-Apr-1941  Today's Date: 06/23/2024 PT Individual Time: 1033-1100 PT Individual Time Calculation (min): 27 min   Today's Date: 06/23/2024 PT Individual Time: 1449-1530 PT Individual Time Calculation (min): 41 min   Short Term Goals: Week 1:  PT Short Term Goal 1 (Week 1): STGs = LTGs  Skilled Therapeutic Interventions/Progress Updates:     1st Session: Pt received seated in recliner and agrees to therapy. No complaint of pain. Pt stands with cues for initiation, then ambulates x150' to gym without AD, wth CGA and cues for upright gaze to improve posture and balance, and increasing trunk rotation and arm swing to improve balance. PT provides pt with rollator and educates on correct use and safety features, including hand brakes. Pt stands with cues for hand placement and ambulates x320' with rollator and cues to decrease WB through AD for improved body mechanics and energy conservation. Seated rest break. Pt ambulates additional bouts of 2x150' with seated rest break on rollator with cues for safe management and positioning against wall to stabilize for seated break. Pt left seated in recliner with alarm intact and all needs within reach.  2nd Session: Pt received seated in recliner and agrees to therapy. No complaint of pain. Pt's SIL present for family education. PT provides update on pt's mobility as well as recommendations for safe mobility following discharge. Pt performs sit to stand with cues for initiation, then ambulates x300' with rollator and cues for posture and decreased WB through RW for improved body mechanics and energy conservation. PT also provides cues to increase step height L>R to decrease risk for falls. Pt takes brief seated rest break then ambulates up and down ramp and performs car transfer with CGA and cues for safe sequencing. Pt ambulates to main gym, x150' with  rollator, then completes x12 6 steps with bilateral hand rails and cues for step sequencing, with CGA provided overall. Seated rest break. Pt ambulates x150' to dayroom and transfers to Nustep with cues for safe AD management and positioning. Pt completes x10:00 on Nustep at workload of 5 with average steps per minute ~48. PT provides cues for hand and foot placement and completing full available ROM. Completed for endurance training and reciprocal coordination. Pt ambulates back to room with rollatore and same cues and assistance. Left seated in recliner with all needs within reach.   Therapy Documentation Precautions:  Precautions Precautions: Fall Restrictions Weight Bearing Restrictions Per Provider Order: No  Therapy/Group: Individual Therapy  Elsie JAYSON Dawn, PT, DPT 06/23/2024, 3:54 PM

## 2024-06-23 NOTE — Patient Care Conference (Signed)
 Inpatient RehabilitationTeam Conference and Plan of Care Update Date: 06/23/2024   Time: 1022 am    Patient Name: Sara [name]      Medical Record Number: [id number]  Date of Birth: 07/26/1941 Sex: Female         Room/Bed: 4W08C/4W08C-01 Payor Info: Payor: [employer] copy[employer] MEDICARE / Plan: [facility] MEDICARE / Product Type: *No Product type* /    Admit Date/Time:  06/17/2024  4:29 PM  Primary Diagnosis:  CVA (cerebral vascular accident) [facility])  Hospital Problems: Principal Problem:   CVA (cerebral vascular accident) [facility])    Expected Discharge Date: Expected Discharge Date: 06/27/24  Team Members Present: Physician leading conference: Dr. Joesph Likes Social Worker Present: Graeme Jude, LCSW Nurse Present: Eulalio Falls, RN PT Present: Kirt Dawn, PT OT Present: Nena Moats, OT SLP Present: Recardo Mole, SLP PPS Coordinator present : Eleanor Colon, SLP     Current Status/Progress Goal Weekly Team Focus  Bowel/Bladder      Bladder retention; intermittent catheterization Constipate with bowels needing occult blood test    Patient will be able to maintain continence with normal bowel movement    Assess bowel and bladder q shift  Swallow/Nutrition/ Hydration   reg/thin meds crushed in puree   supervision  compensatory strategies, pt/family education, tolerance of diet    ADL's   CGA ADL transfers with the RW, CGA ADLs overall. Shuffling gait without AD, good safety awareness overall   Supervision   ADLs, transfers, d/c planning, endurance    Mobility   supervision bed mobility, CGA transfers and ambulation ~150' without AD   Supervision  balance, ambulation, endurance, DC prep    Communication   min   supervision   word finding at the structured level    Safety/Cognition/ Behavioral Observations  min-mod   supervision   selective attention, memory, problem solving    Pain      C/o headache 7/10 relieved by tylenol     <4 w/ prns    Assess pain q  shift  Skin      No skin issues  Remain skin free from infection entire stay on rehab    Assess skin q shift    Discharge Planning:  Pt will d/c to home with her dtr who works during the day. Pt has another dtr that can provide intermittent supervision. Fam edu Tues 1pm-4pm. SW will confirm there are no barriers to discharge.    Team Discussion: Patient was admitted post acute left pontine infarction. Patient with headaches/ poor appetite/ anemia/ hypotension: medication adjusted by MD.   Patient on target to meet rehab goals: yes, currently patient needs CGA with ADLs. Patient transfers with CGA using a RW. Patient with shuffling gait but  with good safety awareness. Patient was able to ambulate CGA  up to 150' without AD. Patient needs min assist with word finding. Overall goals at discharge are set for supervision assistance.   *See Care Plan and progress notes for long and short-term goals.   Revisions to Treatment Plan:  Nutrition consult  Blood transfusion  GI follow up TEDS Encourage fluids Abdominal binder Timed  toileting  Teaching Needs: Safety, medications, transfers, toileting, etc.   Current Barriers to Discharge: Decreased caregiver support, Home enviroment access/layout, Incontinence, and Nutritional means  Possible Resolutions to Barriers: Family Education      Medical Summary Current Status: medically complicated by malnutrition, uti, acute on chronic anemia, pleural effusions, urinary retetion, bowel incontinence/constipation  Barriers to Discharge: Inadequate Nutritional Intake;Incontinence;Infection/IV Antibiotics;Medical stability;Neurogenic Bowel &  Bladder;Self-care education;Symptomatic Anemia   Possible Resolutions to [employer] Focus: 1 unit PRBCs today, monitor labs, encourage PO intakes with dietary, timed toiletting and ISC for urinary retention, finishing abx for UTI   Continued Need for Acute Rehabilitation Level of Care: The patient  requires daily medical management by a physician with specialized training in physical medicine and rehabilitation for the following reasons: Direction of a multidisciplinary physical rehabilitation program to maximize functional independence : Yes Medical management of patient stability for increased activity during participation in an intensive rehabilitation regime.: Yes Analysis of laboratory values and/or radiology reports with any subsequent need for medication adjustment and/or medical intervention. : Yes   I attest that I was present, lead the team conference, and concur with the assessment and plan of the team.   Johnanthony Wilden Gayo 06/23/2024, 1022 am

## 2024-06-23 NOTE — Progress Notes (Signed)
 Met with patient to review current situation, team conference and plan of care. Reviewed co-morbidities,  medications, dietary recommendations and MD follow up at discharge. Continue to follow along to provide educational needs to facilitate preparation for discharge.

## 2024-06-23 NOTE — Plan of Care (Signed)
  Problem: Consults Goal: RH GENERAL PATIENT EDUCATION Description: See Patient Education module for education specifics. Outcome: Progressing   Problem: RH BOWEL ELIMINATION Goal: RH STG MANAGE BOWEL WITH ASSISTANCE Description: STG Manage Bowel with supervision Assistance. Outcome: Progressing   Problem: RH BLADDER ELIMINATION Goal: RH STG MANAGE BLADDER WITH ASSISTANCE Description: STG Manage Bladder With  supervision Assistance Outcome: Progressing   Problem: RH SKIN INTEGRITY Goal: RH STG SKIN FREE OF INFECTION/BREAKDOWN Description: Manage skin free of infection with supervision assistance Outcome: Progressing   Problem: RH SAFETY Goal: RH STG ADHERE TO SAFETY PRECAUTIONS W/ASSISTANCE/DEVICE Description: STG Adhere to Safety Precautions With supervision Assistance/Device. Outcome: Progressing   Problem: RH KNOWLEDGE DEFICIT GENERAL Goal: RH STG INCREASE KNOWLEDGE OF SELF CARE AFTER HOSPITALIZATION Description: Manage increase knowledge of self care after hospitalization with supervision assistance from  children using educational materials provided Outcome: Progressing

## 2024-06-24 ENCOUNTER — Inpatient Hospital Stay (HOSPITAL_COMMUNITY)

## 2024-06-24 LAB — CBC
HCT: 25.4 % — ABNORMAL LOW (ref 36.0–46.0)
Hemoglobin: 8.8 g/dL — ABNORMAL LOW (ref 12.0–15.0)
MCH: 27.6 pg (ref 26.0–34.0)
MCHC: 34.6 g/dL (ref 30.0–36.0)
MCV: 79.6 fL — ABNORMAL LOW (ref 80.0–100.0)
Platelets: 456 K/uL — ABNORMAL HIGH (ref 150–400)
RBC: 3.19 MIL/uL — ABNORMAL LOW (ref 3.87–5.11)
RDW: 15.9 % — ABNORMAL HIGH (ref 11.5–15.5)
WBC: 11.3 K/uL — ABNORMAL HIGH (ref 4.0–10.5)
nRBC: 0 % (ref 0.0–0.2)

## 2024-06-24 LAB — TYPE AND SCREEN
ABO/RH(D): A POS
Antibody Screen: NEGATIVE
Unit division: 0

## 2024-06-24 LAB — BPAM RBC
Blood Product Expiration Date: 202509252359
ISSUE DATE / TIME: 202508261649
Unit Type and Rh: 6200

## 2024-06-24 LAB — GLUCOSE, CAPILLARY
Glucose-Capillary: 92 mg/dL (ref 70–99)
Glucose-Capillary: 181 mg/dL — ABNORMAL HIGH (ref 70–99)
Glucose-Capillary: 169 mg/dL — ABNORMAL HIGH (ref 70–99)
Glucose-Capillary: 185 mg/dL — ABNORMAL HIGH (ref 70–99)

## 2024-06-24 MED ORDER — POLYETHYLENE GLYCOL 3350 17 G PO PACK
17.0000 g | PACK | Freq: Two times a day (BID) | ORAL | Status: DC
  Administered 2024-06-25 – 2024-06-30 (×8): 17 g via ORAL
  Filled 2024-06-24 (×8): qty 1

## 2024-06-24 MED ORDER — HYDROCORTISONE ACETATE 25 MG RE SUPP: 25.0000 mg | Freq: Two times a day (BID) | RECTAL | Status: DC | PRN

## 2024-06-24 MED ORDER — PANTOPRAZOLE SODIUM 40 MG PO TBEC: 40.0000 mg | DELAYED_RELEASE_TABLET | Freq: Every day | ORAL | Status: DC

## 2024-06-24 NOTE — Progress Notes (Signed)
 Occupational Therapy Session Note  Patient Details  Name: Sara [name] MRN: [id number] Date of Birth: 01-Dec-1940  Session 1 Today's Date: 06/24/2024 OT Individual Time: 8975-8881 OT Individual Time Calculation (min): 54 min    Session 2 Today's Date: 06/24/2024 OT Individual Time: 8551-8494 OT Individual Time Calculation (min): 17 min  and Today's Date: 06/24/2024 OT Missed Time: 28 Minutes Missed Time Reason: Nursing care   Short Term Goals: Week 1:  OT Short Term Goal 1 (Week 1): STG=LTG d/t ELOS  Skilled Therapeutic Interventions/Progress Updates:    Session 1 Pt received sitting up with no c/o pain, agreeable to OT session starting with shower. She completed functional mobility around the room with (S), no device. She transferred into shower with min cueing for sequencing/safety. She completed full UB/LB bathing in shower with (S) in standing. She transferred back to EOB and donned shirt and pants with (S). OT donned teds d/t edema in B feet. She reports this happens intermittently, encouraged her to elevate feet while sitting during the day. She then completed 200 ft of functional mobility with no device to get a snack and then to the therapy gym. She had one forward LOB d/t toe catching but was able to recover with min A. Pt completed floor transfer, completing the following sequence after a demonstration lowering themselves to the a floor mat, getting into full supine, transitioning into quadruped, and then kneeling, before pivot into sitting EOM- simulating floor to couch or chair at home. OT provided education on fall recovery, when to get up independently vs when to call for EMS after a fall. Pt returned the demonstration with CGA and increased effort. 2x5 repetitions for strengthening and carryover. Pt required use of rest breaks throughout session for recovery, as well as to support safety and prevent overexertion. During breaks, OT monitored recovery time to assess endurance  and response to exertion. She ended with 300 ft of functional mobility to challenge endurance and dynamic balance and she had a large forward LOB, requiring quick mod A to recover. She returned to her room after and was left sitting up in the recliner with all needs met.   Session 2 Pt received mid toileting with RN. She notified OT that she needed to be bladder scanned and then cathed following. Missed 28 min d/t nursing care. Upon second arrival she had no c/o pain and was agreeable to OT session. She completed functional mobility using the rollator to the therapy gym, 150 ft with close (S) to challenge endurance. Pt completed blocked practice sit <> stand, 3x10 repetitions while holding a 4 lb dowel in BUE to challenge generalized strengthening for ADL transfers, as well as increasing functional activity tolerance and cardiorespiratory endurance. Pt required CGA- (S) overall. She returned to her room and was left sitting up in the recliner with all needs met.   Therapy Documentation Precautions:  Precautions Precautions: Fall Restrictions Weight Bearing Restrictions Per Provider Order: No  Therapy/Group: Individual Therapy  [name] 06/24/2024, 10:42 AM

## 2024-06-24 NOTE — Progress Notes (Signed)
 Speech Language Pathology Daily Session Note  Patient Details  Name: Sara [name] MRN: [id number] Date of Birth: 07/30/1941  Today's Date: 06/24/2024 SLP Individual Time: 0915-0950 SLP Individual Time Calculation (min): 35 min and Today's Date: 06/24/2024 SLP Missed Time: 10 Minutes Missed Time Reason: X-ray  Today's Date: 06/24/2024 SLP Individual Time: 1340-1350 SLP Individual Time Calculation (min): 10 min   Short Term Goals: Week 1: SLP Short Term Goal 1 (Week 1): STGs = LTGs d/t ELOS  Skilled Therapeutic Interventions: 9084-9049: Pt greeted at bedside. 10 mins missed d/t need for imaging. She was up in her recliner upon SLP arrival and very agreeable to tx tasks targeting cognition. She benefited from minA cues to recall PT session right before this. She demonstrated adequate problem solving and supervisionA for reasoning during conversation re upcoming d/c and safety in the home environment. During verbal task challenging working memory, category ID, and word finding, she required only minA cues to ID excluded item in a fo 5. Intermittent repetitions and processing time required, though adequate attention noted throughout ~20 min task. At the end of tx tasks, she was left in her recliner w/ the call light within reach. Recommend cont ST per POC.   1340-1350: Pt greeted in her recliner to make up missed mins this AM. She was able to recall interruption from prev tx session and nursing updates w/ supervisionA. She was able to complete functional time management task re remaining appointments and time until d/c w/ supervisionA for information processing. At the end of tx tasks, she was left in her recliner w/ the call light within reach. Recommend cont ST.   Pain  No pain reported  Therapy/Group: Individual Therapy  [name] 06/24/2024, 9:36 AM

## 2024-06-24 NOTE — Plan of Care (Signed)
  Problem: RH Expression Communication Goal: LTG Patient will increase word finding of common (SLP) Description: LTG:  Patient will increase word finding of common objects/daily info/abstract thoughts with cues using compensatory strategies (SLP). Flowsheets (Taken 06/24/2024 0940) LTG: Patient will increase word finding of common (SLP): Minimal Assistance - Patient > 75% Patient will use compensatory strategies to increase word finding of:  Daily info  Common objects   Problem: RH Memory Goal: LTG Patient will use memory compensatory aids to (SLP) Description: LTG:  Patient will use memory compensatory aids to recall biographical/new, daily complex information with cues (SLP) Flowsheets (Taken 06/24/2024 0940) LTG: Patient will use memory compensatory aids to (SLP): Minimal Assistance - Patient > 75%   Problem: RH Attention Goal: LTG Patient will demonstrate this level of attention during functional activites (SLP) Description: LTG:  Patient will will demonstrate this level of attention during functional activites (SLP) Flowsheets (Taken 06/24/2024 0940) LTG: Patient will demonstrate this level of attention during cognitive/linguistic activities with assistance of (SLP): Minimal Assistance - Patient > 75% Number of minutes patient will demonstrate attention during cognitive/linguistic activities: 10

## 2024-06-24 NOTE — Progress Notes (Signed)
 PROGRESS NOTE   Subjective/Complaints:  No events overnight.  Tolerated PRBCs well, hemoglobin responsive. Multiple small, hard bowel movements overnight. Continues to require intermittent straight cathing, but more continent episodes.  Increasing MiraLAX  to twice a day.  ROS: Denies fevers, chills,  abdominal pain, constipation, diarrhea, SOB, cough, chest pain, new weakness or paraesthesias.   + nausea--resolved + poor appetite--improved overall, remains difficult + Urinary retention--ongoing + Constipation--improving  Objective:   No results found.  Recent Labs    06/23/24 0454 06/24/24 0439  WBC 10.4 11.3*  HGB 6.9* 8.8*  HCT 21.0* 25.4*  PLT 423* 456*    Recent Labs    06/22/24 0513  NA 133*  K 5.3*  CL 106  CO2 23  GLUCOSE 95  BUN 49*  CREATININE 3.68*  CALCIUM  8.7*    Intake/Output Summary (Last 24 hours) at 06/24/2024 0840 Last data filed at 06/24/2024 0600 Gross per 24 hour  Intake 444 ml  Output 2107 ml  Net -1663 ml        Physical Exam: Vital Signs Blood pressure (!) 144/62, pulse 79, temperature 99.5 F (37.5 C), temperature source Oral, resp. rate 17, height 5' 5 (1.651 m), weight 51.5 kg, SpO2 100%.  Constitutional: No apparent distress. Appropriate appearance for age.  Ambulating with therapist. HENT: No JVD. Neck Supple. Trachea midline. Atraumatic, normocephalic. Eyes: PERRLA. EOMI. Visual fields grossly intact.  No nystagmus Cardiovascular: RRR, no murmurs/rub/gallops.  No bilateral peripheral edema.  Respiratory: CTAB. No rales, rhonchi, or wheezing. On RA.  Abdomen: + bowel sounds, normoactive. No distention or tenderness.  Skin: C/D/I. No apparent lesions.  Peripheral IV intact.  MSK:      No apparent deformity.     Neurologic exam:  Cognition: AAO to person, place, time and event.   No dysarthria.   + Moderate cognitive delay --stable + Moderate memory  deficits--stable  Insight: Fair insight into current condition.  Mood: Pleasant affect, appropriate mood.  Strength: Antigravity against resistance all 4 extremities, 4/5 throughout Gait: Contact-guard to min assist with rolling walker.  Ambulating greater than 100 feet.  Prior exams: Sensation: Equal and intact in BL UE and Les.  Reflexes: 2+ in BL UE and LEs. Negative Hoffman's and babinski signs bilaterally.  CN: 2-12 grossly intact.  Coordination: No apparent tremors. No ataxia.    Spasticity: MAS 0 in all extremities.    Assessment/Plan: 1. Functional deficits which require 3+ hours per day of interdisciplinary therapy in a comprehensive inpatient rehab setting. Physiatrist is providing close team supervision and 24 hour management of active medical problems listed below. Physiatrist and rehab team continue to assess barriers to discharge/monitor patient progress toward functional and medical goals  Care Tool:  Bathing    Body parts bathed by patient: Right arm, Left arm, Chest, Abdomen, Front perineal area, Right upper leg, Left upper leg, Face, Left lower leg, Right lower leg, Buttocks   Body parts bathed by helper: Buttocks     Bathing assist Assist Level: Minimal Assistance - Patient > 75% (for balance and thoroughness)     Upper Body Dressing/Undressing Upper body dressing   What is the patient wearing?: Pull over shirt  Upper body assist Assist Level: Minimal Assistance - Patient > 75%    Lower Body Dressing/Undressing Lower body dressing      What is the patient wearing?: Pants, Underwear/pull up     Lower body assist Assist for lower body dressing: Minimal Assistance - Patient > 75%     Toileting Toileting    Toileting assist Assist for toileting: Moderate Assistance - Patient 50 - 74%     Transfers Chair/bed transfer  Transfers assist     Chair/bed transfer assist level: Contact Guard/Touching assist      Locomotion Ambulation   Ambulation assist      Assist level: Contact Guard/Touching assist Assistive device: No Device Max distance: 400'   Walk 10 feet activity   Assist     Assist level: Contact Guard/Touching assist Assistive device: Walker-rolling   Walk 50 feet activity   Assist    Assist level: Contact Guard/Touching assist Assistive device: Walker-rolling    Walk 150 feet activity   Assist    Assist level: Contact Guard/Touching assist Assistive device: Walker-rolling    Walk 10 feet on uneven surface  activity   Assist     Assist level: Minimal Assistance - Patient > 75%     Wheelchair     Assist Is the patient using a wheelchair?: Yes Type of Wheelchair: Manual    Wheelchair assist level: Dependent - Patient 0% Max wheelchair distance: 150'    Wheelchair 50 feet with 2 turns activity    Assist        Assist Level: Dependent - Patient 0%   Wheelchair 150 feet activity     Assist      Assist Level: Dependent - Patient 0%   Blood pressure (!) 144/62, pulse 79, temperature 99.5 F (37.5 C), temperature source Oral, resp. rate 17, height 5' 5 (1.651 m), weight 51.5 kg, SpO2 100%.  1. Functional deficits secondary to acute left pontine infarct             -patient may shower             -ELOS/Goals: S 7-8 days - 8/30 DC              - stable to continue CIR   - 8/25: Needs to be independent at home - was supervision at baseline for cog. Making good progress with therapies.   2.  Antithrombotics: -DVT/anticoagulation:  Pharmaceutical: Heparin              -antiplatelet therapy: continue Aspirin     3. Pain Management: Tylenol  as needed 4. Mood/Behavior/Sleep: LCSW to follow for evaluation and support when available.              -antipsychotic agents: Seroquel               - Sleep aid: Melatonin scheduled 5. Neuropsych/cognition: This patient is capable of making decisions on her own behalf. 6. Skin/Wound Care:  routine pressure relief measures  7. Fluids/Electrolytes/Nutrition: Monitor strict I&O's- follow-up chemistries             -Speech consult  -carb modified diet + boost--continue multivitamin, thiamine   - 8/21: Nutrition consult for poor PO intakes.  Complicated by nausea, may resume Megace  3 times daily tomorrow if no improvements with nausea medication.  - 8/22: Nausea much better on current regimen, however p.o. intakes remain poor.  Resume megace  200 mg suspension BID - 8-24: P.o. is much improved with Megace , continue current regimen--have slowed down a little  8.  Severe sepsis secondary to complicated UTI: Klebsiella bacteremia             - On IV Rocephin ----transitioned to Keflex  500 mg x 7 days today 8/20 -tolerating  8-23: UA remains positive.  Patient still having urinary retention.  Will go back to IV Rocephin  1 g daily and extend course through 10 days   - 8-26: CBC improved.  Continues with retention, see below.  8-27: CBC slightly increased, afebrile, continues on treatment.  Thrombocytosis uptrending.  Finish out antibiotic course of 10 days.  9.  AKI-CKD stage IIIb: Renal function gradually improving, avoid nephrotoxic agents             - Foley catheter in place--begin voiding trial with PVR as renal function improves. (Ordered for 8/22- please check with family if they are ok for this as they were hesitant about removal)             - On lactated Ringer 's??  Continue   - 8/21: Switch from LR 70 cc/hr to 1/2 NS 50 cc/hr for maintenance fluids - monitor Cr daily.  Encouraging patient to drink more water .   - 8/22: BMP today to monitor-much improved, stop maintenance fluids  8-23: P.o. intakes picking up, given I's and O's net negative greater than 1 L, continue to encourage p.o.'s. May need resumption of IVF.   8-24: Creatinine stable off of IV fluids.  Has been getting some with IV antibiotics.  Continue to monitor   10.  Acute urinary retention : Same as above, continue  Flomax  and monitor strict I&O   - Voiding trial initiated 8-22  -8-23: Had 2 caths overnight, 500 cc each, is starting to void this a.m.  Having some bloody output, likely from traumatic caths. Timed Toiletting Q4H ordered.    - Get UA to eval. Already on Kelfex - finishing Monday.  8-24: UA remains positive.  Pharmacy does not have Rapaflo  4 mg tabs, started Cardura  1 mg daily today (pharmacy alternative to Flomax  given pills are crushed).   Continue timed toileting, ISC, UTI treatment as above.    --On evening chart review, continues with intermittent straight cathing, will start bethanechol  5 mg 3 times daily for a.m. 8/26: Increase bethenachol to 10 mg TID.  Tolerating Cardura . 8/27: Despite multiple daily bowel movements, KUB with moderate stool burden.  Likely contributing, will give sorbitol  in AM.   11.  Acute left pontine infarct versus artifactual finding on MRI: Continue aspirin  per neurology recommendation.  12.  T2DM: 8/18 A1c 6.6              - Monitor CBG ACHS and continue SSI--allow for permissive hyperglycemia              - Add Semglee  5U daily--improved, monitor with improved p.o. intakes  Recent Labs    06/24/24 1159 06/24/24 1630 06/24/24 2120  GLUCAP 181* 169* 185*     -    - BG improved; monitor with megace  initiation and PO intakes above  - 8/26: Has stayed in a relatively good range, 6.6 at goal for age, will DC insulin .  Continue to monitor twice daily. 8/27: Blood sugar in good range, p.o. intakes consistent.  DC blood glucose checks.  13. HLD: continue atorvastatin  40 mg daily   14.  HTN/hypotension/tachycardia/Grade 1 diastolic HF: Monitor BP per protocol--Lasix/HCTZ on hold starting low-dose metoprolol  25 mg twice daily for mild sinus tachycardia- discussed with Dr. Raenelle who feels tachycardia is physiologic, no hold parameters  for therapy recommended             - Monitor for orthostatic hypotension--stable this AM   - 8/22: Mild HTN, trend, may need to  add ACE/ARB pending renal fxn above - would not add diuretic while on continuous IVF  - 8-23: Hypotensive, tachycardic today.   Reduce rapiflo to 4 mg daily. Encourage PO fluids--500 cc bolus + TEDS, Binder ordered.   8-24: Per pharmacy, do not stock 4 mg tab.  Changed to Cardura  as above.  BP tolerating. Weights stable.  8/27: Blood pressures increasing, weight slightly up.  Patient doing very well, will trend for another day before resuming diuretics.      06/24/2024    5:03 AM 06/23/2024    8:04 PM 06/23/2024    5:46 PM  Vitals with BMI  Weight 113 lbs 9 oz    BMI 18.89    Systolic 144 134   134 148  Diastolic 62 53   53 51  Pulse 79 88   113 88   Filed Weights   06/23/24 0450 06/23/24 0542 06/24/24 0503  Weight: 51.1 kg 50.1 kg 51.5 kg    15. Transaminitis/chronic liver dz/ascites - elevated ALP. Will get GGT as add-on given other LFTs WNL.    - 8-22: GGT 79; likely related to cholestasis. With ongoing nausea will get abdominal US  for workup.    - 8.23: No gallstones or gallbladder dilation; notable R ascites w/ nodular liver and pleural effusion.  Did discuss results with patient and family.   - Will need GI follow up for findings as OP  16.  Central nausea versus central vertigo.   - Oversedated with Compazine , will schedule Zofran  4 mg 3 times daily head of meals/therapies to see if this improves symptoms   - Keep IV Compazine  and secondary for breakthrough   - No nystagmus on exam, but given location of stroke likely contribution of vertigo; will add as needed meclizine  12.5 mg 3 times daily. May need scopolamine patch    - 8/22: No vomitting reported; tolerating therapies much better.  Continue current regimen--continued improvement   -Much improved/resolved  17. R pleural effusion - incidental on abdominal US . ISB Q2H while awake, Dced IVF, continuing to hold diuretices d/t low BP and elevated HR.   18. Anemia.  Iron deficiency based on panel, likely contributed to  dilution from antibiotics.  - FOBT negative - Added on Iron studies today--sat 9, iron 22.  Getting IV PRBCs today, would benefit from Venofer once completed.  Start daily iron supplement.  - Will get 1 U PRBC today; repeat h/h tomorrow--improved   LOS: 7 days A FACE TO FACE EVALUATION WAS PERFORMED  Sara [name] 06/24/2024, 8:40 AM

## 2024-06-24 NOTE — Progress Notes (Signed)
 Physical Therapy Session Note  Patient Details  Name: Sara [name] MRN: [id number] Date of Birth: 1941-09-28  Today's Date: 06/24/2024 PT Individual Time: 0900-0945 PT Individual Time Calculation (min): 45 min   Short Term Goals: Week 1:  PT Short Term Goal 1 (Week 1): STGs = LTGs  Skilled Therapeutic Interventions/Progress Updates:   Pt received upright in recliner, agreeable to therapy and denied pain throughout session. Pt ambulated from room to main gym w/ CGA and rollator. Pt ambulated through main gym, tasked w/ reaching outside BOS and navigating obstacles, required mod cues to keep rollator close w/ good carryover. Pt required mod cues to lock/unlock brakes on rollator w/ fair carryover.  Pt brief fell in standing, brief discarded and pt had BM during gait back to room. Pt required SBA to CGA throughout transfers, pt able to perform pericare w/ supervision assist. New brief donned w/ mod assist, pt ambulated back to main gym and completed gait w/ BUE reaching task mentioned earlier. Pt ambulated back to room w/ CGA and rollator, returned to recliner w/ call bell and all needs in reach.   Therapy Documentation Precautions:  Precautions Precautions: Fall Restrictions Weight Bearing Restrictions Per Provider Order: No    Therapy/Group: Individual Therapy  Oneil Waldemar Kirt Dasie, PT, DPT 06/24/2024, 12:26 PM

## 2024-06-25 LAB — CBC
HCT: 25 % — ABNORMAL LOW (ref 36.0–46.0)
Hemoglobin: 8.3 g/dL — ABNORMAL LOW (ref 12.0–15.0)
MCH: 27.5 pg (ref 26.0–34.0)
MCHC: 33.2 g/dL (ref 30.0–36.0)
MCV: 82.8 fL (ref 80.0–100.0)
Platelets: 460 K/uL — ABNORMAL HIGH (ref 150–400)
RBC: 3.02 MIL/uL — ABNORMAL LOW (ref 3.87–5.11)
RDW: 17.4 % — ABNORMAL HIGH (ref 11.5–15.5)
WBC: 10.2 K/uL (ref 4.0–10.5)
nRBC: 0 % (ref 0.0–0.2)

## 2024-06-25 LAB — BASIC METABOLIC PANEL WITH GFR
Anion gap: 8 (ref 5–15)
BUN: 52 mg/dL — ABNORMAL HIGH (ref 8–23)
CO2: 22 mmol/L (ref 22–32)
Calcium: 9.3 mg/dL (ref 8.9–10.3)
Chloride: 107 mmol/L (ref 98–111)
Creatinine, Ser: 3.19 mg/dL — ABNORMAL HIGH (ref 0.44–1.00)
GFR, Estimated: 14 mL/min — ABNORMAL LOW (ref >60)
Glucose, Bld: 112 mg/dL — ABNORMAL HIGH (ref 70–99)
Potassium: 5.9 mmol/L — ABNORMAL HIGH (ref 3.5–5.1)
Sodium: 137 mmol/L (ref 135–145)

## 2024-06-25 LAB — GLUCOSE, CAPILLARY: Glucose-Capillary: 106 mg/dL — ABNORMAL HIGH (ref 70–99)

## 2024-06-25 MED ORDER — SORBITOL 70 % SOLN
30.0000 mL | Freq: Once | Status: AC
  Administered 2024-06-25: 30 mL via ORAL
  Filled 2024-06-25: qty 30

## 2024-06-25 MED ORDER — SODIUM ZIRCONIUM CYCLOSILICATE 10 G PO PACK
10.0000 g | PACK | Freq: Every day | ORAL | Status: AC
  Administered 2024-06-25: 10 g via ORAL
  Filled 2024-06-25: qty 1

## 2024-06-25 NOTE — Plan of Care (Signed)
  Problem: Consults Goal: RH GENERAL PATIENT EDUCATION Description: See Patient Education module for education specifics. Outcome: Progressing   Problem: RH BOWEL ELIMINATION Goal: RH STG MANAGE BOWEL WITH ASSISTANCE Description: STG Manage Bowel with supervision Assistance. Outcome: Progressing   Problem: RH BLADDER ELIMINATION Goal: RH STG MANAGE BLADDER WITH ASSISTANCE Description: STG Manage Bladder With  supervision Assistance Outcome: Progressing   Problem: RH SKIN INTEGRITY Goal: RH STG SKIN FREE OF INFECTION/BREAKDOWN Description: Manage skin free of infection with supervision assistance Outcome: Progressing   Problem: RH SAFETY Goal: RH STG ADHERE TO SAFETY PRECAUTIONS W/ASSISTANCE/DEVICE Description: STG Adhere to Safety Precautions With supervision Assistance/Device. Outcome: Progressing   Problem: RH PAIN MANAGEMENT Goal: RH STG PAIN MANAGED AT OR BELOW PT'S PAIN GOAL Description: <4 w/ prns Outcome: Progressing   Problem: RH KNOWLEDGE DEFICIT GENERAL Goal: RH STG INCREASE KNOWLEDGE OF SELF CARE AFTER HOSPITALIZATION Description: Manage increase knowledge of self care after hospitalization with supervision assistance from  children using educational materials provided Outcome: Progressing

## 2024-06-25 NOTE — Progress Notes (Signed)
 Occupational Therapy Session Note  Patient Details  Name: MILLER EDGINGTON MRN: [id number] Date of Birth: 01/12/1941  Today's Date: 06/25/2024 OT Individual Time: 9154-9069 OT Individual Time Calculation (min): 45 min    Short Term Goals: Week 1:  OT Short Term Goal 1 (Week 1): STG=LTG d/t ELOS   Skilled Therapeutic Interventions/Progress Updates:    1:1 Pt received in the recliner. Pt participated in self care retraining at shower level. Pt navigated around the room with supervision to distant supervision. Pt was able to demonstrate dynamic standing balance in showering standing and with dressing safely. Pt safely stood at sink to shave and brush teeth with distant supervision. Pt approaching goal level with ADLs. Did have discussion about medicine management and being able to crush pills and keep them in order ; problem solving how to organize them.   Therapy Documentation Precautions:  Precautions Precautions: Fall Restrictions Weight Bearing Restrictions Per Provider Order: No  Pain:  No c/o pain in session   Therapy/Group:   Claudene Delon Levy 06/25/2024, 3:40 PM

## 2024-06-25 NOTE — Progress Notes (Addendum)
 Initial Nutrition Assessment  DOCUMENTATION CODES:   Severe malnutrition in context of chronic illness (CVA, dysphagia, recent health declines)  INTERVENTION:  Boost Plus po TID, each supplement provides 360 kcal and 14 grams of protein  Magic cup TID with meals, each supplement provides 290 kcal and 9 grams of protein  Liberalized diet to provide increased options and promote adequate intake; pt requested modified soft diet to help with chewing due to missing dentures, ordered DYS 3  Continue 10am snack per pt request for morning hunger in between morning therapy sessions  Continued MVI w/ minerals and thiamine  100 mg supplementation daily  NUTRITION DIAGNOSIS:   Severe Malnutrition related to chronic illness (CVA, dysphagia, recent health declines) as evidenced by severe muscle depletion, severe fat depletion. Remains applicable  GOAL:   Patient will meet greater than or equal to 90% of their needs Progressing  MONITOR:   PO intake, Supplement acceptance  REASON FOR ASSESSMENT:   Consult Assessment of nutrition requirement/status, Poor PO  ASSESSMENT:   Pt with PMH significant for: CKDIII, dysphagia 2/2 hypoglossal nerve palsy,T2DM, HLD, HTN. Admitted to CIR to work on deficits after [facility] hospitalization for severe sepsis 2/2 UTI/Klebsiella bacteremia, AKI and acute CVA.  Recent Hospitalizations: 5/29-5/31: dysarthria, dysphagia, poor PO 6/13-6/16: AKI in setting of poor PO and dehydration 6/27-6/29: AKI in setting of poor PO and dehydration 8/17-8/20: sepsis secondary to UTI/Klebsiella, AKI, and acute CVA  8/20 admitted to [facility]  Spoke with pt and pt's son. Pt's son reports it seems like pt's appetite has picked back up and she is eating well. He reports she has more energy and participating well in therapy sessions since administration of appetite stimulant.   Pt endorses improved appetite. She reports she has been eating her small, frequent meals to help with intake  and reports drinking the Boost Plus shakes sent to her as well as eating the magic cups. Per diet summary documentation, pt's intake has increased since last assessment (previously 25% average intake and now 44% average intake). Praised pt for efforts and progress. Encouraged pt to continue interventions. Suspect pt is close to meeting needs daily based on ONS intake in combination with meal completion. Wt has dropped since last assessment, however pt previously had edema which may have contributed to increased wt at admission and last assessment. Overall, pt's wt has improved since June and is trending upwards. Pt states she eats to help maintain wt, she does not wish to gain any more wt but also does not want to lose any more wt.   Will continue to monitor intake and weight trends  Admit wt to CIR: 110# Current wt: 107#   Average Meal Completion: DYS 3 8/20-8/22: 25% average intake x 5 recorded meals 8/25-8/28: 44% average intake x 7 recorded meals Supplements: Boost Plus, magic cups  Medications reviewed and include:  Ferrous sulfate  325 mg daily Megace   MVI w/ minerals Zofran  Thiamine  100 mg daily Senna BID Miralax  BID Ceftriaxone    Labs reviewed:  Potassium 5.9 Hgb 8.3 BUN 52/ Creatinine 3.19 CBG x 24 hr: 106-185 mg/dL J8r 6.6   Diet Order:   Diet Order             DIET DYS 3 Room service appropriate? Yes; Fluid consistency: Thin  Diet effective now                   EDUCATION NEEDS:   Education needs have been addressed  Skin:  Skin Assessment: Reviewed RN Assessment  Last BM:  8/26 type 1, 2, 5  Height:   Ht Readings from Last 1 Encounters:  06/17/24 5' 5 (1.651 m)    Weight:   Wt Readings from Last 1 Encounters:  06/25/24 48.8 kg    Ideal Body Weight:  56.8 kg  BMI:  Body mass index is 17.9 kg/m.  Estimated Nutritional Needs:   Kcal:  1400-1600  Protein:  60-75g  Fluid:  1.4-1.6L    Josette Glance, MS, RDN, LDN Clinical  Dietitian I Please reach out via secure chat

## 2024-06-25 NOTE — Plan of Care (Signed)
  Problem: Consults Goal: RH GENERAL PATIENT EDUCATION Description: See Patient Education module for education specifics. Outcome: Progressing   Problem: RH BOWEL ELIMINATION Goal: RH STG MANAGE BOWEL WITH ASSISTANCE Description: STG Manage Bowel with supervision Assistance. Outcome: Progressing   Problem: RH BLADDER ELIMINATION Goal: RH STG MANAGE BLADDER WITH ASSISTANCE Description: STG Manage Bladder With  supervision Assistance Outcome: Progressing Flowsheets (Taken 06/25/2024 1812) STG: Pt will manage bladder with assistance: 3-Moderate assistance   Problem: RH SKIN INTEGRITY Goal: RH STG SKIN FREE OF INFECTION/BREAKDOWN Description: Manage skin free of infection with supervision assistance Outcome: Progressing   Problem: RH SAFETY Goal: RH STG ADHERE TO SAFETY PRECAUTIONS W/ASSISTANCE/DEVICE Description: STG Adhere to Safety Precautions With supervision Assistance/Device. Outcome: Progressing   Problem: RH KNOWLEDGE DEFICIT GENERAL Goal: RH STG INCREASE KNOWLEDGE OF SELF CARE AFTER HOSPITALIZATION Description: Manage increase knowledge of self care after hospitalization with supervision assistance from  children using educational materials provided Outcome: Progressing

## 2024-06-25 NOTE — Progress Notes (Signed)
 PROGRESS NOTE   Subjective/Complaints:  No events overnight.  Did complain of headache yesterday, 7 out of 10.  Seems to have resolved overnight.  Patient has no complaints today. Vital stable.  Subclinical fever 99.6 last 2 checks overnight.  Did get as needed Tylenol  overnight pain. A.m. labs significant for resolving leukocytosis, stable hemoglobin 8.3, increasing thrombocytosis, potassium 5.9. BUN and creatinine continue to improve.  GFR estimated at 14.   ROS: Denies fevers, chills,  abdominal pain,  diarrhea, SOB, cough, chest pain, new weakness or paraesthesias.   + poor appetite--improved overall  + Urinary retention--ongoing + Constipation--improving  Objective:   DG Abd 1 View Result Date: 06/24/2024 CLINICAL DATA:  Constipation EXAM: ABDOMEN - 1 VIEW COMPARISON:  03/26/2024 FINDINGS: Moderate stool within the upper ascending and descending colon. No abnormal rectal distention. No small bowel dilatation or evidence of obstruction. Densities in the pelvis likely represent contrast within colonic diverticula. No radiopaque calculi. Scoliotic curvature of the spine. IMPRESSION: Moderate stool in the upper ascending and descending colon. No bowel obstruction. Electronically Signed   By: Andrea Gasman M.D.   On: 06/24/2024 12:43    Recent Labs    06/24/24 0439 06/25/24 0525  WBC 11.3* 10.2  HGB 8.8* 8.3*  HCT 25.4* 25.0*  PLT 456* 460*    Recent Labs    06/25/24 0525  NA 137  K 5.9*  CL 107  CO2 22  GLUCOSE 112*  BUN 52*  CREATININE 3.19*  CALCIUM  9.3    Intake/Output Summary (Last 24 hours) at 06/25/2024 0831 Last data filed at 06/25/2024 0755 Gross per 24 hour  Intake 920 ml  Output 950 ml  Net -30 ml        Physical Exam: Vital Signs Blood pressure 125/63, pulse 77, temperature 99.6 F (37.6 C), temperature source Oral, resp. rate 16, height 5' 5 (1.651 m), weight 48.8 kg, SpO2  100%.  Constitutional: No apparent distress. Appropriate appearance for age.  Ambulating with therapist. HENT: No JVD. Neck Supple. Trachea midline. Atraumatic, normocephalic. Eyes: PERRLA. EOMI. Visual fields grossly intact.  No nystagmus Cardiovascular: RRR, no murmurs/rub/gallops.  1+ bilateral peripheral edema.  Respiratory: CTAB. No rales, rhonchi, or wheezing. On RA.  Abdomen: + bowel sounds, normoactive. No distention or tenderness.  Skin: C/D/I. No apparent lesions.  Peripheral IV intact.  MSK:      No apparent deformity. Full AROM.      Neurologic exam:  Cognition: AAO to person, place, time and event.   No dysarthria.   + Moderate cognitive delay --stable + Moderate memory deficits--stable  Insight: Fair insight into current condition.  Mood: Pleasant affect, appropriate mood.  Strength: Antigravity against resistance all 4 extremities, 4+/5 throughout Sensation: Equal and intact in BL UE and Les.  Reflexes: 2+ in BL UE and LEs. Negative Hoffman's and babinski signs bilaterally.  CN: 2-12 grossly intact.  Coordination: No apparent tremors. No ataxia.    Spasticity: MAS 0 in all extremities.    Assessment/Plan: 1. Functional deficits which require 3+ hours per day of interdisciplinary therapy in a comprehensive inpatient rehab setting. Physiatrist is providing close team supervision and 24 hour management of active medical problems listed  below. Physiatrist and rehab team continue to assess barriers to discharge/monitor patient progress toward functional and medical goals  Care Tool:  Bathing    Body parts bathed by patient: Right arm, Left arm, Chest, Abdomen, Front perineal area, Right upper leg, Left upper leg, Face, Left lower leg, Right lower leg, Buttocks   Body parts bathed by helper: Buttocks     Bathing assist Assist Level: Minimal Assistance - Patient > 75% (for balance and thoroughness)     Upper Body Dressing/Undressing Upper body dressing   What  is the patient wearing?: Pull over shirt    Upper body assist Assist Level: Minimal Assistance - Patient > 75%    Lower Body Dressing/Undressing Lower body dressing      What is the patient wearing?: Pants, Underwear/pull up     Lower body assist Assist for lower body dressing: Minimal Assistance - Patient > 75%     Toileting Toileting    Toileting assist Assist for toileting: Moderate Assistance - Patient 50 - 74%     Transfers Chair/bed transfer  Transfers assist     Chair/bed transfer assist level: Contact Guard/Touching assist     Locomotion Ambulation   Ambulation assist      Assist level: Contact Guard/Touching assist Assistive device: No Device Max distance: 400'   Walk 10 feet activity   Assist     Assist level: Contact Guard/Touching assist Assistive device: Walker-rolling   Walk 50 feet activity   Assist    Assist level: Contact Guard/Touching assist Assistive device: Walker-rolling    Walk 150 feet activity   Assist    Assist level: Contact Guard/Touching assist Assistive device: Walker-rolling    Walk 10 feet on uneven surface  activity   Assist     Assist level: Minimal Assistance - Patient > 75%     Wheelchair     Assist Is the patient using a wheelchair?: Yes Type of Wheelchair: Manual    Wheelchair assist level: Dependent - Patient 0% Max wheelchair distance: 150'    Wheelchair 50 feet with 2 turns activity    Assist        Assist Level: Dependent - Patient 0%   Wheelchair 150 feet activity     Assist      Assist Level: Dependent - Patient 0%   Blood pressure 125/63, pulse 77, temperature 99.6 F (37.6 C), temperature source Oral, resp. rate 16, height 5' 5 (1.651 m), weight 48.8 kg, SpO2 100%.  1. Functional deficits secondary to acute left pontine infarct             -patient may shower             -ELOS/Goals: S 7-8 days - 8/30 DC              - stable to continue CIR   - 8/25:  Needs to be independent at home - was supervision at baseline for cog. Making good progress with therapies.   2.  Antithrombotics: -DVT/anticoagulation:  Pharmaceutical: Heparin              -antiplatelet therapy: continue Aspirin     3. Pain Management: Tylenol  as needed 4. Mood/Behavior/Sleep: LCSW to follow for evaluation and support when available.              -antipsychotic agents: Seroquel               - Sleep aid: Melatonin scheduled 5. Neuropsych/cognition: This patient is capable of making decisions on her own  behalf. 6. Skin/Wound Care: routine pressure relief measures  7. Fluids/Electrolytes/Nutrition: Monitor strict I&O's- follow-up chemistries             -Speech consult  -carb modified diet + boost--continue multivitamin, thiamine   - 8/21: Nutrition consult for poor PO intakes.  Complicated by nausea, may resume Megace  3 times daily tomorrow if no improvements with nausea medication.  - 8/22: Nausea much better on current regimen, however p.o. intakes remain poor.  Resume megace  200 mg suspension BID - 8-24: P.o. is much improved with Megace , continue current regimen--have slowed down a little 8-28: Hyperkalemia, 5.9.  Will give 1 dose Lokelma  10 mg today.  Repeat BMP in AM.  8.  Severe sepsis secondary to complicated UTI: Klebsiella bacteremia             - On IV Rocephin ----transitioned to Keflex  500 mg x 7 days today 8/20 -tolerating  8-23: UA remains positive.  Patient still having urinary retention.  Will go back to IV Rocephin  1 g daily and extend course through 10 days   - 8-26: CBC improved.  Continues with retention, see below.  8-27: CBC slightly increased, afebrile, continues on treatment.  Thrombocytosis uptrending.  Finish out antibiotic course of 10 days.  8/28: WBC normalized.  Trending between 10 and 11.  9.  AKI-CKD stage IIIb: Renal function gradually improving, avoid nephrotoxic agents.  Baseline appears 1.7-2.2             - Foley catheter in place--begin  voiding trial with PVR as renal function improves. (Ordered for 8/22- please check with family if they are ok for this as they were hesitant about removal)             - On lactated Ringer 's??  Continue   - 8/21: Switch from LR 70 cc/hr to 1/2 NS 50 cc/hr for maintenance fluids - monitor Cr daily.  Encouraging patient to drink more water .   - 8/22: BMP today to monitor-much improved, stop maintenance fluids  8-23: P.o. intakes picking up, given I's and O's net negative greater than 1 L, continue to encourage p.o.'s. May need resumption of IVF.   8-24: Creatinine stable off of IV fluids.  Has been getting some with IV antibiotics.  Continue to monitor  8/27: Creatinine continues to improve, gradually.  P.o. fluid intakes approximately 600/day, encouraged at least 1 L.   10.  Acute urinary retention : Same as above, continue Flomax  and monitor strict I&O   - Voiding trial initiated 8-22  -8-23: Had 2 caths overnight, 500 cc each, is starting to void this a.m.  Having some bloody output, likely from traumatic caths. Timed Toiletting Q4H ordered.    - Get UA to eval. Already on Kelfex - finishing Monday.  8-24: UA remains positive.  Pharmacy does not have Rapaflo  4 mg tabs, started Cardura  1 mg daily today (pharmacy alternative to Flomax  given pills are crushed).   Continue timed toileting, ISC, UTI treatment as above.    --On evening chart review, continues with intermittent straight cathing, will start bethanechol  5 mg 3 times daily for a.m. 8/26: Increase bethenachol to 10 mg TID.  Tolerating Cardura . 8/27: Despite multiple daily bowel movements, KUB with moderate stool burden.  Likely contributing, will give sorbitol  in AM. 8/28: Finished Rocephin .  Ongoing urinary retention.  Will give sorbitol  today given moderate stool burden on KUB yesterday, if no improvement in urinary function will place Foley for the weekend.   11.  Acute left pontine  infarct versus artifactual finding on MRI: Continue  aspirin  per neurology recommendation.  12.  T2DM: 8/18 A1c 6.6              - Monitor CBG ACHS and continue SSI--allow for permissive hyperglycemia              - Add Semglee  5U daily--improved, monitor with improved p.o. intakes  - BG improved; monitor with megace  initiation and PO intakes above  - 8/26: Has stayed in a relatively good range, 6.6 at goal for age, will DC insulin .  Continue to monitor twice daily. 8/27: Blood sugar in good range, p.o. intakes consistent.  DC blood glucose checks.  13. HLD: continue atorvastatin  40 mg daily   14.  HTN/hypotension/tachycardia/Grade 1 diastolic HF: Monitor BP per protocol--Lasix/HCTZ on hold starting low-dose metoprolol  25 mg twice daily for mild sinus tachycardia- discussed with Dr. Raenelle who feels tachycardia is physiologic, no hold parameters for therapy recommended             - Monitor for orthostatic hypotension--stable this AM   - 8/22: Mild HTN, trend, may need to add ACE/ARB pending renal fxn above - would not add diuretic while on continuous IVF  - 8-23: Hypotensive, tachycardic today.   Reduce rapiflo to 4 mg daily. Encourage PO fluids--500 cc bolus + TEDS, Binder ordered.   8-24: Per pharmacy, do not stock 4 mg tab.  Changed to Cardura  as above.  BP tolerating. Weights stable.  8/27: Blood pressures increasing, weight slightly up.  Patient doing very well, will trend for another day before resuming diuretics.  8/28: Weights down despite decent p.o. intakes and being off of diuretics.  Is developing some mild peripheral edema, so we will hold off on additional fluid boluses for now.  BP stable.      06/25/2024    6:00 AM 06/25/2024    4:55 AM 06/24/2024    7:46 PM  Vitals with BMI  Weight 107 lbs 9 oz    BMI 17.9    Systolic  125 124  Diastolic  63 55  Pulse  77 91   Filed Weights   06/23/24 0542 06/24/24 0503 06/25/24 0600  Weight: 50.1 kg 51.5 kg 48.8 kg    15. Transaminitis/chronic liver dz/ascites - elevated ALP. Will  get GGT as add-on given other LFTs WNL.    - 8-22: GGT 79; likely related to cholestasis. With ongoing nausea will get abdominal US  for workup.    - 8.23: No gallstones or gallbladder dilation; notable R ascites w/ nodular liver and pleural effusion.  Did discuss results with patient and family.   - Will need GI follow up for findings as OP  16.  Central nausea versus central vertigo.   - Oversedated with Compazine , will schedule Zofran  4 mg 3 times daily head of meals/therapies to see if this improves symptoms   - Keep IV Compazine  and secondary for breakthrough   - No nystagmus on exam, but given location of stroke likely contribution of vertigo; will add as needed meclizine  12.5 mg 3 times daily. May need scopolamine patch    - 8/22: No vomitting reported; tolerating therapies much better.  Continue current regimen--continued improvement   -Much improved/resolved  17. R pleural effusion - incidental on abdominal US . ISB Q2H while awake, Dced IVF, continuing to hold diuretices d/t low BP and elevated HR.   18. Anemia.  Iron deficiency based on panel, likely contributed to dilution from antibiotics.  - FOBT negative -  Added on Iron studies today--sat 9, iron 22.  Getting IV PRBCs today, would benefit from Venofer once completed.  Start daily iron supplement.  - Will get 1 U PRBC today; repeat h/h tomorrow--improved   18.  Thrombocytosis.  Likely reactive, monitor  - 8-28: Mild increase to stable; repeat on Monday  LOS: 8 days A FACE TO FACE EVALUATION WAS PERFORMED  Joesph JAYSON Likes 06/25/2024, 8:31 AM

## 2024-06-25 NOTE — Progress Notes (Signed)
 Physical Therapy Session Note  Patient Details  Name: Sara [name] MRN: [id number] Date of Birth: 1941-04-09  Today's Date: 06/25/2024 PT Individual Time: 0805-0845 PT Individual Time Calculation (min): 40 min   Today's Date: 06/25/2024 PT Individual Time: 1005-1100 PT Individual Time Calculation (min): 55 min   Short Term Goals: Week 1:  PT Short Term Goal 1 (Week 1): STGs = LTGs  Skilled Therapeutic Interventions/Progress Updates:     1st Session: Pt received seated in recliner and agrees to therapy. No complaint of pain. Pt performs sit to stand with cues for initiation. Pt ambulates x150' to gym without AD with cues for upright gaze to improve posture and balance, increasing step height and stride length to decrease risk for falls, and increasing trunk roation and arm swing for improved balance. Seated rest break. PT places 3 lb ankle weights on each leg to increase loading through lower extremities. Pt ambulates 2x175' with emphasis on increasing bilateral step height L>R, as well as stride length to improve balance and efficiency of gait pattern. Pt then performs alternating foot taps on 8 inch platform 3x20 with seated rest breaks between each set. For initial set pt requires minA for stability but for subsequent sets pt completes with CGA and cues for posture and body mechanics. Pt ambulates back to room with ankle weights still in place. Left seated in recliner with all needs within reach.   2nd Session: Pt received seated in recliner and agrees to therapy. No complaint of pain. Sit to stand with cues for initiation. Pt ambulates x125' to gym with cues for upright gaze to improve posture and balance. Pt performs forward and backward ambulation to challenge dynamic standing balance. Pt ambulates forward x20' and backward x20' with cues to increase stride length to challenge balance and improve gait mechanics. Pt then performs 3x10 step ups on 6 platform with alternating lower  extremities, with CGA/minA for trunk stability, with cues for step sequencing and safety. Performed for lower extremity strengthening and balance training.   Pt then tasked while holding tidal tank to provide unstable objet for increasing balance challenge and core engagement, as well as coordination challenge for upper extremities. Pt ambulates x175' without LOB. Pt then performs 3x10 sit to stand repetitions while holding tidal tank, lifting tank above head in standing with each rep to provide sequencing task as well as strength and balance challenge. Seated rest breaks between each set. Pt ambulates back to room. Left seated in recliner with alarm intact and all needs within reach.   Therapy Documentation Precautions:  Precautions Precautions: Fall Restrictions Weight Bearing Restrictions Per Provider Order: No   Therapy/Group: Individual Therapy  Elsie JAYSON Dawn, PT, DPT 06/25/2024, 4:57 PM

## 2024-06-25 NOTE — Group Note (Signed)
 Patient Details Name: JUELLE DICKMANN MRN: [id number] DOB: 07-20-1941 Today's Date: 06/25/2024  Time Calculation: OT Group Time Calculation OT Group Start Time: 1430 OT Group Stop Time: 1530 OT Group Time Calculation (min): 60 min      Group Description: Dance Group: Pt participated in dance group with an emphasis on social interaction, motor planning, increasing overall activity tolerance and bimanual tasks. All songs were selected by group members. Dance moves included AROM of BUE/BLE gross motor movements with an emphasis on building functional endurance.   Individual level documentation: Patient completed group from sitting level. Patientt needed supervision to complete various dance moves with OT providing visual model.  Patient able to create her own modifications during group.  Pain:  0/10  Precautions:  Falls  Katheryn SHAUNNA [place] 06/25/2024, 3:37 PM

## 2024-06-26 DIAGNOSIS — E875 Hyperkalemia: Secondary | ICD-10-CM

## 2024-06-26 DIAGNOSIS — R339 Retention of urine, unspecified: Secondary | ICD-10-CM

## 2024-06-26 DIAGNOSIS — D649 Anemia, unspecified: Secondary | ICD-10-CM

## 2024-06-26 DIAGNOSIS — N179 Acute kidney failure, unspecified: Secondary | ICD-10-CM

## 2024-06-26 LAB — CBC WITH DIFFERENTIAL/PLATELET
Abs Immature Granulocytes: 0.05 K/uL (ref 0.00–0.07)
Basophils Absolute: 0.1 K/uL (ref 0.0–0.1)
Basophils Relative: 1 %
Eosinophils Absolute: 0.3 K/uL (ref 0.0–0.5)
Eosinophils Relative: 3 %
HCT: 26.8 % — ABNORMAL LOW (ref 36.0–46.0)
Hemoglobin: 8.7 g/dL — ABNORMAL LOW (ref 12.0–15.0)
Immature Granulocytes: 1 %
Lymphocytes Relative: 33 %
Lymphs Abs: 3.3 K/uL (ref 0.7–4.0)
MCH: 27 pg (ref 26.0–34.0)
MCHC: 32.5 g/dL (ref 30.0–36.0)
MCV: 83.2 fL (ref 80.0–100.0)
Monocytes Absolute: 0.8 K/uL (ref 0.1–1.0)
Monocytes Relative: 8 %
Neutro Abs: 5.4 K/uL (ref 1.7–7.7)
Neutrophils Relative %: 54 %
Platelets: 535 K/uL — ABNORMAL HIGH (ref 150–400)
RBC: 3.22 MIL/uL — ABNORMAL LOW (ref 3.87–5.11)
RDW: 18.3 % — ABNORMAL HIGH (ref 11.5–15.5)
WBC: 9.9 K/uL (ref 4.0–10.5)
nRBC: 0 % (ref 0.0–0.2)

## 2024-06-26 LAB — URINALYSIS, W/ REFLEX TO CULTURE (INFECTION SUSPECTED)
Bilirubin Urine: NEGATIVE
Glucose, UA: 50 mg/dL — AB
Hgb urine dipstick: NEGATIVE
Ketones, ur: NEGATIVE mg/dL
Nitrite: NEGATIVE
Protein, ur: 30 mg/dL — AB
Specific Gravity, Urine: 1.009 (ref 1.005–1.030)
WBC, UA: >50 WBC/hpf (ref 0–5)
pH: 7 (ref 5.0–8.0)

## 2024-06-26 LAB — BASIC METABOLIC PANEL WITH GFR
Anion gap: 8 (ref 5–15)
BUN: 54 mg/dL — ABNORMAL HIGH (ref 8–23)
CO2: 21 mmol/L — ABNORMAL LOW (ref 22–32)
Calcium: 9.4 mg/dL (ref 8.9–10.3)
Chloride: 105 mmol/L (ref 98–111)
Creatinine, Ser: 3.19 mg/dL — ABNORMAL HIGH (ref 0.44–1.00)
GFR, Estimated: 14 mL/min — ABNORMAL LOW (ref >60)
Glucose, Bld: 143 mg/dL — ABNORMAL HIGH (ref 70–99)
Potassium: 5.9 mmol/L — ABNORMAL HIGH (ref 3.5–5.1)
Sodium: 134 mmol/L — ABNORMAL LOW (ref 135–145)

## 2024-06-26 MED ORDER — GERHARDT'S BUTT CREAM
TOPICAL_CREAM | Freq: Every day | CUTANEOUS | Status: DC
  Filled 2024-06-26: qty 60

## 2024-06-26 MED ORDER — SODIUM ZIRCONIUM CYCLOSILICATE 10 G PO PACK
10.0000 g | PACK | Freq: Once | ORAL | Status: AC
  Administered 2024-06-26: 10 g via ORAL
  Filled 2024-06-26: qty 1

## 2024-06-26 MED ORDER — BETHANECHOL CHLORIDE 10 MG PO TABS
25.0000 mg | ORAL_TABLET | Freq: Three times a day (TID) | ORAL | Status: DC
  Administered 2024-06-26 – 2024-06-30 (×12): 25 mg via ORAL
  Filled 2024-06-26 (×3): qty 3
  Filled 2024-06-26: qty 2.5
  Filled 2024-06-26 (×9): qty 3

## 2024-06-26 MED ORDER — DOXAZOSIN MESYLATE 2 MG PO TABS
2.0000 mg | ORAL_TABLET | Freq: Every day | ORAL | Status: DC
  Administered 2024-06-27 – 2024-06-30 (×4): 2 mg via ORAL
  Filled 2024-06-26 (×4): qty 1

## 2024-06-26 NOTE — Progress Notes (Signed)
 Occupational Therapy Weekly Progress Note  Patient Details  Name: Sara [name] MRN: [id number] Date of Birth: 03-30-1941  Beginning of progress report period: 06/18/24 End of progress report period: 06/25/24  No STGs set d/t ELOS. Pt was expected to discharge tomorrow but d/t multiple medical issues d/c is now on hold. Pt is close to goal level for ADLs and transfers. She is still requiring cueing for safety awareness and rollator management. OT will continue to address dynamic balance, endurance, and safety during ADLs/IADLs to faciltiate a safe d/c home. Family education has been completed.   Patient continues to demonstrate the following deficits: muscle weakness, decreased cardiorespiratoy endurance, decreased safety awareness, and decreased standing balance and decreased balance strategies and therefore will continue to benefit from skilled OT intervention to enhance overall performance with BADL and iADL.  Patient progressing toward long term goals..  Continue plan of care.  OT Short Term Goals Week 1:  OT Short Term Goal 1 (Week 1): STG=LTG d/t ELOS OT Short Term Goal 1 - Progress (Week 1): Progressing toward goal Week 2:  OT Short Term Goal 1 (Week 2): STG= LTG d/t ELOS   [name] 06/26/2024, 12:28 PM

## 2024-06-26 NOTE — Progress Notes (Signed)
 Physical Therapy Discharge Summary  Patient Details  Name: Sara [name] MRN: [id number] Date of Birth: [birth year]  Date of Discharge from PT service:June 29, 2024  Today's Date: 06/29/2024 PT Individual Time: 9051-8940 PT Individual Time Calculation (min): 71 min    Patient has met 9 of 9 long term goals due to improved activity tolerance, improved balance, improved postural control, and increased strength.  Patient to discharge at an ambulatory level Modified Independent.   Patient's care partner is independent to provide the necessary physical assistance at discharge.  Reasons goals not met: NA  Recommendation:  Patient will benefit from ongoing skilled PT services in outpatient setting to continue to advance safe functional mobility, address ongoing impairments in balance, ambulation, endurance, and minimize fall risk.  Equipment: No equipment provided  Reasons for discharge: treatment goals met  Patient/family agrees with progress made and goals achieved: Yes  Skilled Therapeutic Interventions: Pt received seated in recliner and agrees to therapy. No complaint of pain. Sit to stand without assistance. Pt ambulates x300' with rollator and completes ramp navigation and car transfer without assistance required. Brief seated rest break, and then pt ambulates x150' to main gym and takes seated rest break in rollator. Extended seated rest break. Pt ambulates to stairs and completes x12 6 steps with bilateral hand rails. Pt completes exercises for strengthening and NMR for balance training. Pt completes 3x5 sit to stand repetitions with arms folded across chest to increase loading through legs and challenge balance. Pt ambulates to dayroom with rollator and transfers to Nustep. Pt completes Nustep for endurance training. Pt completes x10:00 at workload of 5 with average steps per minute ~37. PT provides cues for hand and foot placement and completing full available ROM. Pt ambulates  back to room. Left seated with all needs within reach.    PT Discharge Precautions/Restrictions Precautions Precautions: Fall Restrictions Weight Bearing Restrictions Per Provider Order: No Pain Interference Pain Interference Pain Effect on Sleep: 1. Rarely or not at all Pain Interference with Therapy Activities: 1. Rarely or not at all Pain Interference with Day-to-Day Activities: 1. Rarely or not at all Vision/Perception  Vision - History Ability to See in Adequate Light: 0 Adequate Perception Perception: Within Functional Limits Praxis Praxis: WFL  Cognition Overall Cognitive Status: Impaired/Different from baseline Arousal/Alertness: Awake/alert Orientation Level: Oriented X4 Year: 2025 Month: August Day of Week: Correct Attention: Selective Selective Attention: Impaired Selective Attention Impairment: Verbal complex;Functional complex Memory: Impaired Memory Impairment: Decreased short term memory;Storage deficit Decreased Short Term Memory: Verbal complex;Functional basic Awareness: Impaired Awareness Impairment: Emergent impairment Problem Solving: Impaired Problem Solving Impairment: Verbal complex;Functional complex Executive Function: Self Monitoring;Self Correcting Self Monitoring: Impaired Self Correcting: Impaired Safety/Judgment: Appears intact Sensation Sensation Light Touch: Appears Intact Coordination Gross Motor Movements are Fluid and Coordinated: Yes Fine Motor Movements are Fluid and Coordinated: Yes Motor  Motor Motor: Within Functional Limits  Mobility Bed Mobility Bed Mobility: Supine to Sit;Sit to Supine Supine to Sit: Independent Sit to Supine: Independent Transfers Transfers: Sit to Stand;Stand to Sit;Stand Pivot Transfers Sit to Stand: Independent with assistive device Stand to Sit: Independent with assistive device Stand Pivot Transfers: Independent with assistive device Transfer (Assistive device): Rollator Locomotion   Gait Ambulation: Yes Gait Assistance: Independent with assistive device Gait Distance (Feet): 300 Feet Assistive device: Rollator Gait Gait: Yes Gait Pattern: Impaired Gait Pattern: Decreased stride length Gait velocity: decreased Stairs / Additional Locomotion Stairs: Yes Stairs Assistance: Supervision/Verbal cueing Stair Management Technique: One rail Right Number of Stairs: 12 Height  of Stairs: 6 Curb: Supervision/Verbal TEFL teacher Mobility: No  Trunk/Postural Assessment  Cervical Assessment Cervical Assessment: Within Functional Limits Thoracic Assessment Thoracic Assessment: Within Functional Limits Lumbar Assessment Lumbar Assessment: Within Functional Limits Postural Control Postural Control: Within Functional Limits  Balance Balance Balance Assessed: Yes Static Sitting Balance Static Sitting - Balance Support: Feet supported Static Sitting - Level of Assistance: 7: Independent Dynamic Sitting Balance Dynamic Sitting - Balance Support: Feet supported Dynamic Sitting - Level of Assistance: 7: Independent Static Standing Balance Static Standing - Balance Support: During functional activity Static Standing - Level of Assistance: 6: Modified independent (Device/Increase time) Dynamic Standing Balance Dynamic Standing - Balance Support: During functional activity Dynamic Standing - Level of Assistance: 6: Modified independent (Device/Increase time) Extremity Assessment  RLE Assessment RLE Assessment: Exceptions to [facility] General Strength Comments: Grossly 4+/5, endurance impaired LLE Assessment LLE Assessment: Exceptions to [facility] General Strength Comments: Grossly 4+/5, endurance impaired   Elsie JAYSON Dawn, PT, DPT 06/30/2024, 10:04 AM

## 2024-06-26 NOTE — Progress Notes (Addendum)
 Occupational Therapy Discharge Summary  Patient Details  Name: Sara [name] MRN: [id number] Date of Birth: [birth year]  Date of Discharge from OT service:August  2025  Patient has met 10 of 10 long term goals due to improved activity tolerance, improved balance, postural control, ability to compensate for deficits, improved awareness, and improved coordination.  Patient to discharge at overall Supervision level.  Patient's care partner is independent to provide the necessary physical assistance at discharge.  Family education completed with her son and two daughters. Sara [name] has made excellent progress toward her OT goals.   Recommendation:  Patient will benefit from ongoing skilled OT services in outpatient setting to continue to advance functional skills in the area of BADL and iADL.  Equipment: TTB recommended- family buying  Reasons for discharge: treatment goals met and discharge from hospital  Patient/family agrees with progress made and goals achieved: Yes  OT Discharge Precautions/Restrictions  Precautions Precautions: Fall Restrictions Weight Bearing Restrictions Per Provider Order: No  Pain Pain Assessment Pain Scale: 0-10 Pain Score: 4  Pain Location: Head Pain Intervention(s): Medication (See eMAR) ADL ADL Equipment Provided:  () Eating: Supervision/safety Where Assessed-Eating: Chair Grooming: Supervision/safety Where Assessed-Grooming: Sitting at sink Upper Body Bathing: Supervision/safety Where Assessed-Upper Body Bathing: Shower Lower Body Bathing: Supervision/safety Where Assessed-Lower Body Bathing: Shower Upper Body Dressing: Supervision/safety Where Assessed-Upper Body Dressing: Sitting at sink Lower Body Dressing: Supervision/safety Where Assessed-Lower Body Dressing: Sitting at sink Toileting: Supervision/safety Where Assessed-Toileting: Teacher, adult education: Distant supervision Statistician Method: Network engineer: Engineer, technical sales: Close supervison Web designer Method: Ship broker: Insurance underwriter: Distant supervision Film/video editor Method: Designer, industrial/product: Sales promotion account executive Baseline Vision/History: 1 Wears glasses Patient Visual Report: No change from baseline Vision Assessment?: No apparent visual deficits Perception  Perception: Within Functional Limits Praxis Praxis: WFL Cognition Cognition Overall Cognitive Status: Impaired/Different from baseline Arousal/Alertness: Awake/alert Orientation Level: Person;Place;Situation Person: Oriented Place: Oriented Situation: Oriented Memory: Impaired Memory Impairment: Decreased short term memory;Storage deficit Decreased Short Term Memory: Verbal complex;Functional basic Attention: Selective Selective Attention: Impaired Selective Attention Impairment: Verbal complex;Functional complex Awareness: Impaired Awareness Impairment: Anticipatory impairment Problem Solving: Impaired Problem Solving Impairment: Verbal complex;Functional complex Executive Function: Self Monitoring;Self Correcting Self Monitoring: Impaired Self Correcting: Impaired Safety/Judgment: Appears intact Brief Interview for Mental Status (BIMS) Repetition of Three Words (First Attempt): 3 Temporal Orientation: Year: Correct Temporal Orientation: Month: Accurate within 5 days Temporal Orientation: Day: Correct Recall: Sock: Yes, no cue required Recall: Blue: Yes, no cue required Recall: Bed: Yes, no cue required BIMS Summary Score: 15 Sensation Sensation Light Touch: Appears Intact Hot/Cold: Appears Intact Proprioception: Appears Intact Coordination Gross Motor Movements are Fluid and Coordinated: Yes Fine Motor Movements are Fluid and Coordinated: Yes Motor  Motor Motor: Within Functional Limits Mobility  Bed Mobility Bed Mobility: Supine  to Sit;Sit to Supine Supine to Sit: Independent Sit to Supine: Independent Transfers Sit to Stand: Independent with assistive device Stand to Sit: Independent with assistive device  Trunk/Postural Assessment  Cervical Assessment Cervical Assessment: Within Functional Limits Thoracic Assessment Thoracic Assessment: Within Functional Limits Lumbar Assessment Lumbar Assessment: Within Functional Limits Postural Control Postural Control: Within Functional Limits  Balance Balance Balance Assessed: Yes Static Sitting Balance Static Sitting - Balance Support: Feet supported Static Sitting - Level of Assistance: 7: Independent Dynamic Sitting Balance Dynamic Sitting - Balance Support: Feet supported Dynamic Sitting - Level of Assistance: 7: Independent Static Standing Balance Static Standing - Balance Support: During  functional activity;Bilateral upper extremity supported Static Standing - Level of Assistance: 6: Modified independent (Device/Increase time) Dynamic Standing Balance Dynamic Standing - Balance Support: During functional activity;Bilateral upper extremity supported Dynamic Standing - Level of Assistance: 6: Modified independent (Device/Increase time) Extremity/Trunk Assessment RUE Assessment RUE Assessment: Within Functional Limits LUE Assessment LUE Assessment: Within Functional Limits   [name] 06/26/2024, 11:15 AM

## 2024-06-26 NOTE — Progress Notes (Signed)
 Speech Language Pathology Discharge Summary  Patient Details  Name: Sara [name] MRN: [id number] Date of Birth: [birth year]  Date of Discharge from SLP service:June 26, 2024   Patient has met 6 of 6 long term goals.  Patient to discharge at overall Supervision level.  Reasons goals not met: n/a   Clinical Impression/Discharge Summary:  Pt made good progress this stay, as evidenced by mastery of 6/6 LTGs. She demonstrates improved comprehension, word finding, functional problem solving, and attention. Additionally, compensatory memory strategies were introduced and she utilizes them w/ minA to assist w/ recall of recent info and education. She is discharging at supervisionA for cognition overall and demonstrates the cognitive-linguistic skills to d/c home w/o 24/7 supervision. She continues to tolerate a regular diet/thin liquids @ modI and SLP provided pharyngeal exercises for her to continue in the home environment given hx of dysphagia. She would benefit from continued ST upon d/c to target remaining cognitive-linguistic deficits, maximize pt independence, and reduce caregiver burden.    Care Partner:  Caregiver Able to Provide Assistance: Yes  Type of Caregiver Assistance: Cognitive  Recommendation:  Outpatient SLP  Rationale for SLP Follow Up: Maximize cognitive function and independence;Maximize functional communication;Reduce caregiver burden   Equipment: n/a   Reasons for discharge: Discharged from hospital   Patient/Family Agrees with Progress Made and Goals Achieved: Yes    [name] 06/26/2024, 8:36 AM

## 2024-06-26 NOTE — Progress Notes (Signed)
 [facility] Progress Note  Subjective:  Last seen by nephrology on 08/22.  Reengaged today, 8/29, for hyperkalemia.  She is currently located in inpatient rehab.  Her potassium was normal on 8/22.  Repeat lab on 8/25 with potassium 5.3.  K 5.8 on 8/28 and received Lokelma  10.  K 5.8 again today, and received additional Lokelma  10. Bladder scan today with . Seen the bedside chair.  Daughter at bedside.  Conversational.  Seems in good spirits.  Went over recent history.  Vitals:   06/25/24 2008 06/26/24 0325 06/26/24 0600 06/26/24 0948  BP: (!) 134/51 (!) 121/52  (!) 120/99  Pulse: 88 83  79  Resp: 18 18    Temp: 99 F (37.2 C) 98.8 F (37.1 C)    TempSrc:      SpO2: 100% 99%    Weight:   48.7 kg   Height:        Exam: Gen alert, no distress, elderly female Chest: BL BS, no IWOB Cardiac: RRR Abd soft ntnd  Ext no LE or UE edema Neuro is alert, Ox 3 , nf     Home bp meds: Hydrochlorothiazide  Others: lipitor, nexium , megace  (not taking), remeron , prns       Date                             Creat               eGFR (ml/min) 2019- 2022                  0.69- 1.12 2023                            0.73- 2.13 2024                            0.85- 1.00        57- 69 ml/min May 2025                    2.24 >> 1.52    21- 34 ml/min June 2025                   1.75- 2.91        15- 29 ml/min 06/14/24                        9.52                 4 ml/min       UA: mod Hb, small LE, prot 100, >50 rbc/ >50 wbc/ 0-5 epi UNa, UCr: pend Renal US : 10.9/ 9/9 cm kidneys w/o hydro, ^echotexture   Assessment/ Plan: Hyperkalemia: Previously resolved, now recurrent.  Received Lokelma  10 without improvement.  Does not appear to be on any new medications.  Possibly diet related, however likely minimally contributing, given reported decrease p.o. intake.  Likely her urinary retention is main etiology to her hyperkalemia.  Defer to primary regarding urinary retention.  Would  give additional Lokelma  10 tonight. AKI on CKD 3b: b/l creatinine 1.5- 1.8 from may.  AKI previously up to 9.5 with urinary retention and likely UTI as well as dehydration and possible tubular injury.  Creatinine steadily improving with Foley catheter, antibiotics, hydration.  Renal function has improved.  Now with recurrent urinary retention.  Defer to primary. Urinary retention: Management per primary team   Evalene CHRISTELLA Lanes  06/26/2024, 1:27 PM  Recent Labs  Lab 06/25/24 0525 06/26/24 0447  HGB 8.3* 8.7*  CALCIUM  9.3 9.4  CREATININE 3.19* 3.19*  K 5.9* 5.9*   Recent Labs  Lab 06/23/24 1021  IRON 22*  TIBC 255  FERRITIN 133   Inpatient medications:  sodium chloride    Intravenous Once   aspirin   81 mg Oral Daily   atorvastatin   40 mg Oral QHS   bethanechol   25 mg Oral TID   Chlorhexidine  Gluconate Cloth  6 each Topical Daily   [START ON 06/27/2024] doxazosin   2 mg Oral Daily   ferrous sulfate   325 mg Oral Q breakfast   Gerhardt's butt cream   Topical Daily   heparin   5,000 Units Subcutaneous Q8H   lactose free nutrition  237 mL Oral TID WC   megestrol   200 mg Oral BID   melatonin  5 mg Oral QHS   metoprolol  tartrate  25 mg Oral BID   multivitamin with minerals  1 tablet Oral Daily   ondansetron   4 mg Oral TID   polyethylene glycol  17 g Oral BID   senna-docusate  2 tablet Oral BID   thiamine   100 mg Oral Daily     acetaminophen , alum & mag hydroxide-simeth, bisacodyl , diphenhydrAMINE , guaiFENesin -dextromethorphan, hydrocortisone , lidocaine , meclizine , [DISCONTINUED] prochlorperazine  **OR** [DISCONTINUED] prochlorperazine  **OR** prochlorperazine , QUEtiapine , sodium phosphate, traZODone 

## 2024-06-26 NOTE — Progress Notes (Signed)
 Speech Language Pathology Daily Session Note  Patient Details  Name: Sara [name] MRN: [id number] Date of Birth: 02/07/1941  Today's Date: 06/26/2024 SLP Individual Time: 0802-0900 SLP Individual Time Calculation (min): 58 min  Short Term Goals: Week 1: SLP Short Term Goal 1 (Week 1): STGs = LTGs d/t ELOS  Skilled Therapeutic Interventions: Pt greeted in her recliner. She was awake/alert upon SLP arrival, and had just finished her breakfast. She was very pleasant and cooperative throughout tx tasks targeting cognition, communication, and dysphagia. She completed a written organization task ID jumbled words in a category and was able to sustain attention for ~20 mins independently and ~30 mins w/ only supervisionA cues. She required only supervisionA for functional problem solving and word finding throughout. SLP provided education re additional cognitive-linguistic tasks to complete at home and provided her w/ some. SLP then facilitated review of pharyngeal strengthening exercises provide in a prev tx session. She was able to recall introduction of CTAR and masako and required only supervisionA to ensure adequate technique across 5 repetitions of each. Throughout tx tasks, she required only supervisionA for complex auditory comprehension. At the end of tx tasks, she was left in her recliner w/ the call light within reach.    Pain Pain Assessment Pain Scale: 0-10 Pain Score: 0-No pain Pain Location: Head Pain Orientation: Left Pain Descriptors / Indicators: Headache Pain Frequency: Other (Comment) (In the morning) Pain Intervention(s): Medication (See eMAR) Multiple Pain Sites: No  Therapy/Group: Individual Therapy  [name] 06/26/2024, 12:20 PM

## 2024-06-26 NOTE — Plan of Care (Signed)
  Problem: Consults Goal: RH GENERAL PATIENT EDUCATION Description: See Patient Education module for education specifics. Outcome: Progressing   Problem: RH BOWEL ELIMINATION Goal: RH STG MANAGE BOWEL WITH ASSISTANCE Description: STG Manage Bowel with supervision Assistance. Outcome: Progressing   Problem: RH BLADDER ELIMINATION Goal: RH STG MANAGE BLADDER WITH ASSISTANCE Description: STG Manage Bladder With  supervision Assistance Outcome: Progressing   Problem: RH SKIN INTEGRITY Goal: RH STG SKIN FREE OF INFECTION/BREAKDOWN Description: Manage skin free of infection with supervision assistance Outcome: Progressing   Problem: RH SAFETY Goal: RH STG ADHERE TO SAFETY PRECAUTIONS W/ASSISTANCE/DEVICE Description: STG Adhere to Safety Precautions With supervision Assistance/Device. Outcome: Progressing   Problem: RH PAIN MANAGEMENT Goal: RH STG PAIN MANAGED AT OR BELOW PT'S PAIN GOAL Description: <4 w/ prns Outcome: Progressing   Problem: RH KNOWLEDGE DEFICIT GENERAL Goal: RH STG INCREASE KNOWLEDGE OF SELF CARE AFTER HOSPITALIZATION Description: Manage increase knowledge of self care after hospitalization with supervision assistance from  children using educational materials provided Outcome: Progressing

## 2024-06-26 NOTE — Progress Notes (Addendum)
 PROGRESS NOTE   Subjective/Complaints:  Pt without complaints this morning. Was up in recliner. Slept well. No sob, pain  ROS: Patient denies fever, rash, sore throat, blurred vision, dizziness, nausea, vomiting, diarrhea, cough, shortness of breath or chest pain, joint or back/neck pain, headache, or mood change.   Objective:   No results found.   Recent Labs    06/25/24 0525 06/26/24 0447  WBC 10.2 9.9  HGB 8.3* 8.7*  HCT 25.0* 26.8*  PLT 460* 535*    Recent Labs    06/25/24 0525 06/26/24 0447  NA 137 134*  K 5.9* 5.9*  CL 107 105  CO2 22 21*  GLUCOSE 112* 143*  BUN 52* 54*  CREATININE 3.19* 3.19*  CALCIUM  9.3 9.4    Intake/Output Summary (Last 24 hours) at 06/26/2024 1104 Last data filed at 06/26/2024 0800 Gross per 24 hour  Intake 477 ml  Output 2359 ml  Net -1882 ml        Physical Exam: Vital Signs Blood pressure (!) 120/99, pulse 79, temperature 98.8 F (37.1 C), resp. rate 18, height 5' 5 (1.651 m), weight 48.7 kg, SpO2 99%.  Constitutional: No distress . Vital signs reviewed. HEENT: NCAT, EOMI, oral membranes moist Neck: supple Cardiovascular: RRR without murmur. No JVD    Respiratory/Chest: CTA Bilaterally without wheezes or rales. Normal effort    GI/Abdomen: BS +, non-tender, non-distended Ext: no clubbing, cyanosis, or edema Psych: pleasant and cooperative  Skin: C/D/I. No apparent lesions.  Peripheral IV in place  MSK:      No apparent deformity. Full AROM. No pain     Neurologic exam:  Cognition: AAO to person, place, time and event.  CN nonfocal No dysarthria. Slow processing, stm deficits Insight: Fair insight into current condition.  Strength: Antigravity against resistance all 4 extremities, 4+/5 throughout Sensation: Equal and intact in BL UE and Les.  Reflexes: 2+ in BL UE and LEs.   Coordination: No apparent tremors. No ataxia.    Spasticity: MAS 0 in all  extremities.    Assessment/Plan: 1. Functional deficits which require 3+ hours per day of interdisciplinary therapy in a comprehensive inpatient rehab setting. Physiatrist is providing close team supervision and 24 hour management of active medical problems listed below. Physiatrist and rehab team continue to assess barriers to discharge/monitor patient progress toward functional and medical goals  Care Tool:  Bathing    Body parts bathed by patient: Right arm, Left arm, Chest, Abdomen, Front perineal area, Right upper leg, Left upper leg, Face, Left lower leg, Right lower leg, Buttocks   Body parts bathed by helper: Buttocks     Bathing assist Assist Level: Supervision/Verbal cueing     Upper Body Dressing/Undressing Upper body dressing   What is the patient wearing?: Pull over shirt    Upper body assist Assist Level: Independent    Lower Body Dressing/Undressing Lower body dressing      What is the patient wearing?: Pants, Underwear/pull up     Lower body assist Assist for lower body dressing: Supervision/Verbal cueing     Toileting Toileting    Toileting assist Assist for toileting: Supervision/Verbal cueing     Transfers Chair/bed transfer  Transfers assist     Chair/bed transfer assist level: Independent with assistive device Chair/bed transfer assistive device: Geologist, engineering   Ambulation assist      Assist level: Contact Guard/Touching assist Assistive device: No Device Max distance: 400'   Walk 10 feet activity   Assist     Assist level: Contact Guard/Touching assist Assistive device: Walker-rolling   Walk 50 feet activity   Assist    Assist level: Contact Guard/Touching assist Assistive device: Walker-rolling    Walk 150 feet activity   Assist    Assist level: Contact Guard/Touching assist Assistive device: Walker-rolling    Walk 10 feet on uneven surface  activity   Assist     Assist level:  Minimal Assistance - Patient > 75%     Wheelchair     Assist Is the patient using a wheelchair?: Yes Type of Wheelchair: Manual    Wheelchair assist level: Dependent - Patient 0% Max wheelchair distance: 150'    Wheelchair 50 feet with 2 turns activity    Assist        Assist Level: Dependent - Patient 0%   Wheelchair 150 feet activity     Assist      Assist Level: Dependent - Patient 0%   Blood pressure (!) 120/99, pulse 79, temperature 98.8 F (37.1 C), resp. rate 18, height 5' 5 (1.651 m), weight 48.7 kg, SpO2 99%.  1. Functional deficits secondary to acute left pontine infarct             -patient may shower             -ELOS/Goals: S 7-8 days - 8/30 DC              -Continue CIR therapies including PT, OT. Finalize dc planning.   2.  Antithrombotics: -DVT/anticoagulation:  Pharmaceutical: Heparin              -antiplatelet therapy: continue Aspirin     3. Pain Management: Tylenol  as needed 4. Mood/Behavior/Sleep: LCSW to follow for evaluation and support when available.              -antipsychotic agents: Seroquel               - Sleep aid: Melatonin scheduled 5. Neuropsych/cognition: This patient is capable of making decisions on her own behalf. 6. Skin/Wound Care: routine pressure relief measures  7. Fluids/Electrolytes/Nutrition: Monitor strict I&O's- follow-up chemistries             -Speech consult  -carb modified diet + boost--continue multivitamin, thiamine   - 8/21: Nutrition consult for poor PO intakes.  Complicated by nausea, may resume Megace  3 times daily tomorrow if no improvements with nausea medication.  - 8/22: Nausea much better on current regimen, however p.o. intakes remain poor.  Resume megace  200 mg suspension BID - 8-24: P.o. is much improved with Megace , continue current regimen--have slowed down a little 8-29: Hyperkalemia, still 5.9.  repeat lokelma  today 10g    -nephro consult 8.  Severe sepsis secondary to complicated UTI:  Klebsiella bacteremia             - On IV Rocephin ----transitioned to Keflex  500 mg x 7 days today 8/20 -tolerating  8-23: UA remains positive.  Patient still having urinary retention.  Will go back to IV Rocephin  1 g daily and extend course through 10 days   - 8-26: CBC improved.  Continues with retention, see below.  8-27: CBC slightly increased, afebrile, continues on treatment.  Thrombocytosis uptrending.  Finish out antibiotic course of 10 days.  8/29: WBC normalized.    9.  AKI-CKD stage IIIb: Renal function gradually improving, avoid nephrotoxic agents.  Baseline appears 1.7-2.2             - Foley catheter in place--begin voiding trial with PVR as renal function improves. (Ordered for 8/22- please check with family if they are ok for this as they were hesitant about removal)             - On lactated Ringer 's??  Continue   - 8/21: Switch from LR 70 cc/hr to 1/2 NS 50 cc/hr for maintenance fluids - monitor Cr daily.  Encouraging patient to drink more water .   - 8/22: BMP today to monitor-much improved, stop maintenance fluids  8-23: P.o. intakes picking up, given I's and O's net negative greater than 1 L, continue to encourage p.o.'s. May need resumption of IVF.   8-24: Creatinine stable off of IV fluids.  Has been getting some with IV antibiotics.  Continue to monitor  8/27: Creatinine continues to improve, gradually.  P.o. fluid intakes approximately 600/day, encouraged at least 1 L.  8/29 Creatine SLOWLY falling   -push fluids   -rx hyperkalemia   -reconsult nephrology   -Renal U/S 8/17 c/w medical renal disease 10.  Acute urinary retention : Same as above, continue Flomax  and monitor strict I&O     8/28: Finished Rocephin .  Ongoing urinary retention.  Will give sorbitol  today given moderate stool burden on KUB yesterday, if no improvement in urinary function will place Foley for the weekend.  8/29  pt requiring I/O cath for large volume this am (500cc). Has had other larger  PVR's   -in the setting of her renal issues, K+, it might be wise to hold her discharge until we work through some of these things.    -increase cardura  to 2mg    -increase urecholine  to 25mg  tid   -oob to bed to void, preferably toilet   -recheck UA/UCX as well   -Renal U/S 8/17 c/w medical renal disease  11.  Acute left pontine infarct versus artifactual finding on MRI: Continue aspirin  per neurology recommendation.  12.  T2DM: 8/18 A1c 6.6              - Monitor CBG ACHS and continue SSI--allow for permissive hyperglycemia              - Add Semglee  5U daily--improved, monitor with improved p.o. intakes  - BG improved; monitor with megace  initiation and PO intakes above  - 8/26: Has stayed in a relatively good range, 6.6 at goal for age, will DC insulin .  Continue to monitor twice daily. 8/27: Blood sugar in good range, p.o. intakes consistent.  DC blood glucose checks.  13. HLD: continue atorvastatin  40 mg daily   14.  HTN/hypotension/tachycardia/Grade 1 diastolic HF: Monitor BP per protocol--Lasix/HCTZ on hold starting low-dose metoprolol  25 mg twice daily for mild sinus tachycardia- discussed with Dr. Raenelle who feels tachycardia is physiologic, no hold parameters for therapy recommended             - Monitor for orthostatic hypotension--stable this AM   - 8/22: Mild HTN, trend, may need to add ACE/ARB pending renal fxn above - would not add diuretic while on continuous IVF  - 8-23: Hypotensive, tachycardic today.   Reduce rapiflo to 4 mg daily. Encourage PO fluids--500 cc bolus + TEDS, Binder ordered.   8-24: Per pharmacy, do not  stock 4 mg tab.  Changed to Cardura  as above.  BP tolerating. Weights stable.  8/27: Blood pressures increasing, weight slightly up.  Patient doing very well, will trend for another day before resuming diuretics.  8/28: Weights down despite decent p.o. intakes and being off of diuretics.  Is developing some mild peripheral edema, so we will hold off on  additional fluid boluses for now.  BP stable.      06/26/2024    9:48 AM 06/26/2024    6:00 AM 06/26/2024    3:25 AM  Vitals with BMI  Weight  107 lbs 6 oz   BMI  17.87   Systolic 120  121  Diastolic 99  52  Pulse 79  83   Filed Weights   06/24/24 0503 06/25/24 0600 06/26/24 0600  Weight: 51.5 kg 48.8 kg 48.7 kg    15. Transaminitis/chronic liver dz/ascites - elevated ALP. Will get GGT as add-on given other LFTs WNL.    - 8-22: GGT 79; likely related to cholestasis. With ongoing nausea will get abdominal US  for workup.    - 8.23: No gallstones or gallbladder dilation; notable R ascites w/ nodular liver and pleural effusion.  Did discuss results with patient and family.   - Will need GI follow up for findings as OP  16.  Central nausea versus central vertigo.   - Oversedated with Compazine , will schedule Zofran  4 mg 3 times daily head of meals/therapies to see if this improves symptoms   - Keep IV Compazine  and secondary for breakthrough   - No nystagmus on exam, but given location of stroke likely contribution of vertigo; will add as needed meclizine  12.5 mg 3 times daily. May need scopolamine patch    - 8/22: No vomitting reported; tolerating therapies much better.  Continue current regimen--continued improvement   -Much improved/resolved  17. R pleural effusion - incidental on abdominal US . ISB Q2H while awake, Dced IVF, continuing to hold diuretices d/t low BP and elevated HR.   18. Anemia.  Iron deficiency based on panel, likely contributed to dilution from antibiotics.  - FOBT negative - Added on Iron studies today--sat 9, iron 22.  Getting IV PRBCs today, would benefit from Venofer once completed.  Start daily iron supplement.  - received 1 U PRBC 8/26; hgb 8.7 8/29--appears stable   18.  Thrombocytosis.  Likely reactive, monitor  - 8-29 some increase to 535k  -recheck Monday   LOS: 9 days A FACE TO FACE EVALUATION WAS PERFORMED  Arthea ONEIDA Gunther 06/26/2024, 11:04 AM

## 2024-06-26 NOTE — Progress Notes (Signed)
 Patient ID: Sara [name], female   DOB: [birth year], 83 y.o.   MRN: [id number]  According to MD pt is not medically ready for discharge tomorrow due to medical issues.

## 2024-06-26 NOTE — Progress Notes (Signed)
 Occupational Therapy Session Note  Patient Details  Name: Sara [name] MRN: [id number] Date of Birth: 08-03-1941  Today's Date: 06/26/2024 OT Individual Time: 1030-1130 OT Individual Time Calculation (min): 60 min    Short Term Goals: Week 1:  OT Short Term Goal 1 (Week 1): STG=LTG d/t ELOS  Skilled Therapeutic Interventions/Progress Updates:    Pt received sitting up in the recliner with no c/o pain. She completed full shower at (S)- mod I level using a shower chair, she required no physical intervention, but required min cueing for safety awareness and rollator management.  She donned a gown with (S) and socks with (S). Discussed d/c planning at length. She completed 200 ft of functional mobility to the therapy gym with the rollator with (S). She completed standing level dynamic throwing/catching volley activity using the rebounder to challenge BUE coordination and reaching outside BOS. She required min A overall over several repetitions that were graded up by narrowing BOS in standing and in lateral stand/rotation at the trunk. Pt completed standing level stepping activity with no UE support using a 10 in step 3x5-8 repetitions. Activity performed to challenge dynamic standing balance and functional activity tolerance to simulate household threshold management and reduce fall risk. Pt required min A d/t high step, progressing to mod A as she fatigued during the final 2 sets. She returned to her room. Pt was left sitting up in the recliner with all needs met and call bell within reach.   Therapy Documentation Precautions:  Precautions Precautions: Fall Restrictions Weight Bearing Restrictions Per Provider Order: No  Therapy/Group: Individual Therapy  Sara [name] 06/26/2024, 11:28 AM

## 2024-06-26 NOTE — Progress Notes (Signed)
 Occupational Therapy Session Note  Patient Details  Name: Sara [name] MRN: [id number] Date of Birth: 06-Apr-1941  Today's Date: 06/26/2024 OT Individual Time: 1420-1500 OT Individual Time Calculation (min): 40 min    Short Term Goals: Week 2:  OT Short Term Goal 1 (Week 2): STG= LTG d/t ELOS  Skilled Therapeutic Interventions/Progress Updates:    Pt received sitting with no c/o pain, agreeable to OT session. Daughter present, discussed d/c within OT scope. Pt completed functional mobility, 200 ft, with the rollator to the ADL apt with (S). She completed simulated bed making task, OT providing cueing for body positioning for fall risk reduction, as well as rollator management. She completed several activities in the kitchen to simulate cooking tasks, reaching into the fridge, cabinets, and stove. She was provided cueing/education on fall risk reduction and energy conservation. She returned to the therapy gym following.She completed two sets of 200 ft of functional mobility carrying a weighted laundry basket (10 lbs) to challenge dynamic standing balance and endurance. Rest break required between. She returned to her room and was left sitting up with all needs met.    Therapy Documentation Precautions:  Precautions Precautions: Fall Restrictions Weight Bearing Restrictions Per Provider Order: No  Therapy/Group: Individual Therapy  Sara [name] 06/26/2024, 2:50 PM

## 2024-06-26 NOTE — Progress Notes (Incomplete)
 Inpatient Rehabilitation Care Coordinator Discharge Note DC SAT 8/30  Patient Details  Name: Sara [name] MRN: [id number] Date of Birth: 03-Oct-1941   Discharge location: HOME WITH DAUGHTER WHOM WORKS DURING THE DAY BUT OTHER DAUGHTER CNA CHECK IN ON HER  Length of Stay: 10 DAYS  Discharge activity level: SUPERVISION LEVEL  Home/community participation: ACTIVE  Patient response un:Yzjouy Literacy - How often do you need to have someone help you when you read instructions, pamphlets, or other written material from your doctor or pharmacy?: Rarely  Patient response un:Dnrpjo Isolation - How often do you feel lonely or isolated from those around you?: Never  Services provided included: MD, RD, PT, OT, SLP, RN, CM, TR, Pharmacy, SW  Financial Services:  Financial Services Utilized: Private Insurance [facility] MEDICARE  Choices offered to/list presented to: DAUGHTER  Follow-up services arranged:  Outpatient, Patient/Family has no preference for HH/DME agencies    Outpatient Servicies: [facility] [address]  OT  SP WILL CALL TO SET UP FOLOW UP APPOINTMENTS    NO EQUIPMENT NEEDS  Patient response to transportation need: Is the patient able to respond to transportation needs?: Yes In the past 12 months, has lack of transportation kept you from medical appointments or from getting medications?: No In the past 12 months, has lack of transportation kept you from meetings, work, or from getting things needed for daily living?: No   Patient/Family verbalized understanding of follow-up arrangements:  Yes  Individual responsible for coordination of the follow-up plan: HOPE-DAUGHTER [phone]  Confirmed correct DME delivered: Raymonde Asberry MATSU 06/26/2024    Comments (or additional information):FAMILY WAS IN FOR EDUCATION AND COMPLETED. AWARE OF MOM'S CARE NEEDS  Summary of Stay    Date/Time Discharge Planning CSW  06/23/24 1023 Pt will d/c to home with her dtr  who works during the day. Pt has another dtr that can provide intermittent supervision. Fam edu Tues 1pm-4pm. SW will confirm there are no barriers to discharge. AAC       Trev Boley G

## 2024-06-26 NOTE — Plan of Care (Signed)
  Problem: Consults Goal: RH GENERAL PATIENT EDUCATION Description: See Patient Education module for education specifics. Outcome: Progressing   Problem: RH BOWEL ELIMINATION Goal: RH STG MANAGE BOWEL WITH ASSISTANCE Description: STG Manage Bowel with supervision Assistance. Outcome: Progressing   Problem: RH BLADDER ELIMINATION Goal: RH STG MANAGE BLADDER WITH ASSISTANCE Description: STG Manage Bladder With  supervision Assistance Outcome: Progressing   Problem: RH SKIN INTEGRITY Goal: RH STG SKIN FREE OF INFECTION/BREAKDOWN Description: Manage skin free of infection with supervision assistance Outcome: Progressing   Problem: RH SAFETY Goal: RH STG ADHERE TO SAFETY PRECAUTIONS W/ASSISTANCE/DEVICE Description: STG Adhere to Safety Precautions With supervision Assistance/Device. Outcome: Progressing   Problem: RH PAIN MANAGEMENT Goal: RH STG PAIN MANAGED AT OR BELOW PT'S PAIN GOAL Description: <4 w/ prns Outcome: Progressing

## 2024-06-26 NOTE — Progress Notes (Signed)
 Physical Therapy Session Note  Patient Details  Name: Sara [name] MRN: [id number] Date of Birth: October 28, 1941  Today's Date: 06/26/2024 PT Individual Time: 0901-0944 PT Individual Time Calculation (min): 43 min   Short Term Goals: Week 1:  PT Short Term Goal 1 (Week 1): STGs = LTGs  Skilled Therapeutic Interventions/Progress Updates:     Pt received seated in recliner and agrees to therapy. No complaint of pain. Pt performs sit to stand without AD. Pt ambulates x300' with rollator and completes ramp navigation and car transfer at mod(I). Pt ambulates x150' to main gym and PT provides cues for safe AD management to transition to sitting on rollator with use of wall for stability. Following rest break, pt ambulates to stairwell and completes x12 6 steps with Rt handrail and cues for safety and step sequencing. Pt then ambulates x150' and again takes seated rest break in rollator with wall providing stability. Pt transfers to Nustep and completes x12:00 at workload of 5 with average steps per minute ~50. PT provides cues for hand and foot placement and completing full available ROM. Pt ambulates back to room with rollator. Left seated in recliner with all needs within reach.   Therapy Documentation Precautions:  Precautions Precautions: Fall Restrictions Weight Bearing Restrictions Per Provider Order: No   Therapy/Group: Individual Therapy  Elsie JAYSON Dawn, PT, DPT 06/26/2024, 4:14 PM

## 2024-06-27 DIAGNOSIS — I6302 Cerebral infarction due to thrombosis of basilar artery: Secondary | ICD-10-CM

## 2024-06-27 LAB — BASIC METABOLIC PANEL WITH GFR
Anion gap: 8 (ref 5–15)
BUN: 57 mg/dL — ABNORMAL HIGH (ref 8–23)
CO2: 23 mmol/L (ref 22–32)
Calcium: 9.5 mg/dL (ref 8.9–10.3)
Chloride: 104 mmol/L (ref 98–111)
Creatinine, Ser: 2.94 mg/dL — ABNORMAL HIGH (ref 0.44–1.00)
GFR, Estimated: 15 mL/min — ABNORMAL LOW (ref >60)
Glucose, Bld: 118 mg/dL — ABNORMAL HIGH (ref 70–99)
Potassium: 5.8 mmol/L — ABNORMAL HIGH (ref 3.5–5.1)
Sodium: 135 mmol/L (ref 135–145)

## 2024-06-27 LAB — URINE CULTURE: Culture: NO GROWTH

## 2024-06-27 MED ORDER — SODIUM ZIRCONIUM CYCLOSILICATE 10 G PO PACK
10.0000 g | PACK | Freq: Three times a day (TID) | ORAL | Status: AC
  Administered 2024-06-27 – 2024-06-28 (×6): 10 g via ORAL
  Filled 2024-06-27 (×6): qty 1

## 2024-06-27 NOTE — Plan of Care (Signed)
  Problem: Consults Goal: RH GENERAL PATIENT EDUCATION Description: See Patient Education module for education specifics. Outcome: Progressing   Problem: RH BOWEL ELIMINATION Goal: RH STG MANAGE BOWEL WITH ASSISTANCE Description: STG Manage Bowel with supervision Assistance. Outcome: Progressing   Problem: RH BLADDER ELIMINATION Goal: RH STG MANAGE BLADDER WITH ASSISTANCE Description: STG Manage Bladder With  supervision Assistance Outcome: Progressing   Problem: RH SKIN INTEGRITY Goal: RH STG SKIN FREE OF INFECTION/BREAKDOWN Description: Manage skin free of infection with supervision assistance Outcome: Progressing   Problem: RH SAFETY Goal: RH STG ADHERE TO SAFETY PRECAUTIONS W/ASSISTANCE/DEVICE Description: STG Adhere to Safety Precautions With supervision Assistance/Device. Outcome: Progressing   Problem: RH PAIN MANAGEMENT Goal: RH STG PAIN MANAGED AT OR BELOW PT'S PAIN GOAL Description: <4 w/ prns Outcome: Progressing

## 2024-06-27 NOTE — Progress Notes (Signed)
 PROGRESS NOTE   Subjective/Complaints: Appreciate nephrology note, additional Lokelma  ordered for elevated potassium Discharged delayed due to  ROS: Patient denies fever, rash, sore throat, blurred vision, dizziness, nausea, vomiting, diarrhea, cough, shortness of breath or chest pain, joint or back/neck pain, headache, or mood change.   Objective:   No results found.   Recent Labs    06/25/24 0525 06/26/24 0447  WBC 10.2 9.9  HGB 8.3* 8.7*  HCT 25.0* 26.8*  PLT 460* 535*    Recent Labs    06/26/24 0447 06/27/24 0550  NA 134* 135  K 5.9* 5.8*  CL 105 104  CO2 21* 23  GLUCOSE 143* 118*  BUN 54* 57*  CREATININE 3.19* 2.94*  CALCIUM  9.4 9.5    Intake/Output Summary (Last 24 hours) at 06/27/2024 1619 Last data filed at 06/27/2024 1258 Gross per 24 hour  Intake 840 ml  Output 2736 ml  Net -1896 ml        Physical Exam: Vital Signs Blood pressure 126/65, pulse 91, temperature 98 F (36.7 C), resp. rate 18, height 5' 5 (1.651 m), weight 51.8 kg, SpO2 100%.   Skin: C/D/I. No apparent lesions.  Peripheral IV in place  MSK:      No apparent deformity. Full AROM. No pain     Neurologic exam:  Cognition: AAO to person, place, time and event.  CN nonfocal No dysarthria. Slow processing, stm deficits Insight: Fair insight into current condition.  Strength: Antigravity against resistance all 4 extremities, 4+/5 throughout Sensation: Equal and intact in BL UE and Les.  Reflexes: 2+ in BL UE and LEs.   Coordination: No apparent tremors. No ataxia.    Spasticity: MAS 0 in all extremities.    Assessment/Plan: 1. Functional deficits which require 3+ hours per day of interdisciplinary therapy in a comprehensive inpatient rehab setting. Physiatrist is providing close team supervision and 24 hour management of active medical problems listed below. Physiatrist and rehab team continue to assess barriers to  discharge/monitor patient progress toward functional and medical goals  Care Tool:  Bathing    Body parts bathed by patient: Right arm, Left arm, Chest, Abdomen, Front perineal area, Right upper leg, Left upper leg, Face, Left lower leg, Right lower leg, Buttocks   Body parts bathed by helper: Buttocks     Bathing assist Assist Level: Supervision/Verbal cueing     Upper Body Dressing/Undressing Upper body dressing   What is the patient wearing?: Pull over shirt    Upper body assist Assist Level: Independent    Lower Body Dressing/Undressing Lower body dressing      What is the patient wearing?: Pants, Underwear/pull up     Lower body assist Assist for lower body dressing: Supervision/Verbal cueing     Toileting Toileting    Toileting assist Assist for toileting: Supervision/Verbal cueing     Transfers Chair/bed transfer  Transfers assist     Chair/bed transfer assist level: Independent with assistive device Chair/bed transfer assistive device: Geologist, engineering   Ambulation assist      Assist level: Contact Guard/Touching assist Assistive device: No Device Max distance: 400'   Walk 10 feet activity   Assist  Assist level: Contact Guard/Touching assist Assistive device: Walker-rolling   Walk 50 feet activity   Assist    Assist level: Contact Guard/Touching assist Assistive device: Walker-rolling    Walk 150 feet activity   Assist    Assist level: Contact Guard/Touching assist Assistive device: Walker-rolling    Walk 10 feet on uneven surface  activity   Assist     Assist level: Minimal Assistance - Patient > 75%     Wheelchair     Assist Is the patient using a wheelchair?: Yes Type of Wheelchair: Manual    Wheelchair assist level: Dependent - Patient 0% Max wheelchair distance: 150'    Wheelchair 50 feet with 2 turns activity    Assist        Assist Level: Dependent - Patient 0%    Wheelchair 150 feet activity     Assist      Assist Level: Dependent - Patient 0%   Blood pressure 126/65, pulse 91, temperature 98 F (36.7 C), resp. rate 18, height 5' 5 (1.651 m), weight 51.8 kg, SpO2 100%.  1. Functional deficits secondary to acute left pontine infarct             -patient may shower             -ELOS/Goals: S 7-8 days - 8/30 DC              -Continue CIR therapies including PT, OT. Finalize dc planning.   2.  Antithrombotics: -DVT/anticoagulation:  Pharmaceutical: Heparin              -antiplatelet therapy: continue Aspirin     3. Pain Management: Tylenol  as needed 4. Mood/Behavior/Sleep: LCSW to follow for evaluation and support when available.              -antipsychotic agents: Seroquel               - Sleep aid: Melatonin scheduled 5. Neuropsych/cognition: This patient is capable of making decisions on her own behalf. 6. Skin/Wound Care: routine pressure relief measures  7. Fluids/Electrolytes/Nutrition: Monitor strict I&O's- follow-up chemistries             -Speech consult  -carb modified diet + boost--continue multivitamin, thiamine   - 8/21: Nutrition consult for poor PO intakes.  Complicated by nausea, may resume Megace  3 times daily tomorrow if no improvements with nausea medication.  - 8/22: Nausea much better on current regimen, however p.o. intakes remain poor.  Resume megace  200 mg suspension BID - 8-24: P.o. is much improved with Megace , continue current regimen--have slowed down a little 8-29: Hyperkalemia, still 5.9.  repeat lokelma  today 10g    -nephro consult 8.  Severe sepsis secondary to complicated UTI: Klebsiella bacteremia             - On IV Rocephin ----transitioned to Keflex  500 mg x 7 days today 8/20 -tolerating  8-23: UA remains positive.  Patient still having urinary retention.  Will go back to IV Rocephin  1 g daily and extend course through 10 days   - 8-26: CBC improved.  Continues with retention, see below.  8-27: CBC  slightly increased, afebrile, continues on treatment.  Thrombocytosis uptrending.  Finish out antibiotic course of 10 days.  8/29: WBC normalized.    9.  AKI-CKD stage IIIb: Renal function gradually improving, avoid nephrotoxic agents.  Baseline appears 1.7-2.2             - Foley catheter in place--begin voiding trial with PVR as renal function improves. (  Ordered for 8/22- please check with family if they are ok for this as they were hesitant about removal)             - On lactated Ringer 's??  Continue   - 8/21: Switch from LR 70 cc/hr to 1/2 NS 50 cc/hr for maintenance fluids - monitor Cr daily.  Encouraging patient to drink more water .   - 8/22: BMP today to monitor-much improved, stop maintenance fluids  8-23: P.o. intakes picking up, given I's and O's net negative greater than 1 L, continue to encourage p.o.'s. May need resumption of IVF.   8-24: Creatinine stable off of IV fluids.  Has been getting some with IV antibiotics.  Continue to monitor  8/27: Creatinine continues to improve, gradually.  P.o. fluid intakes approximately 600/day, encouraged at least 1 L.  8/29 Creatine SLOWLY falling   -push fluids   -rx hyperkalemia   -reconsult nephrology   -Renal U/S 8/17 c/w medical renal disease 10.  Acute urinary retention : Same as above, continue Flomax  and monitor strict I&O     8/28: Finished Rocephin .  Ongoing urinary retention.  Will give sorbitol  today given moderate stool burden on KUB yesterday, if no improvement in urinary function will place Foley for the weekend.  8/29  pt requiring I/O cath for large volume this am (500cc). Has had other larger PVR's   -in the setting of her renal issues, K+, it might be wise to hold her discharge until we work through some of these things.    -increase cardura  to 2mg    -increase urecholine  to 25mg  tid   -oob to bed to void, preferably toilet   -recheck UA/UCX as well   -Renal U/S 8/17 c/w medical renal disease UA with positive WBCs but rare  bacteria patient is afebrile, urine culture shows no growth thus far Retention improving PVR 187 this afternoon 11.  Acute left pontine infarct versus artifactual finding on MRI: Continue aspirin  per neurology recommendation.  12.  T2DM: 8/18 A1c 6.6              - Monitor CBG ACHS and continue SSI--allow for permissive hyperglycemia              - Add Semglee  5U daily--improved, monitor with improved p.o. intakes  - BG improved; monitor with megace  initiation and PO intakes above  - 8/26: Has stayed in a relatively good range, 6.6 at goal for age, will DC insulin .  Continue to monitor twice daily. 8/27: Blood sugar in good range, p.o. intakes consistent.  DC blood glucose checks.  13. HLD: continue atorvastatin  40 mg daily   14.  HTN/hypotension/tachycardia/Grade 1 diastolic HF: Monitor BP per protocol--Lasix/HCTZ on hold starting low-dose metoprolol  25 mg twice daily for mild sinus tachycardia- discussed with Dr. Raenelle who feels tachycardia is physiologic, no hold parameters for therapy recommended             - Monitor for orthostatic hypotension--stable this AM   - 8/22: Mild HTN, trend, may need to add ACE/ARB pending renal fxn above - would not add diuretic while on continuous IVF  - 8-23: Hypotensive, tachycardic today.   Reduce rapiflo to 4 mg daily. Encourage PO fluids--500 cc bolus + TEDS, Binder ordered.   8-24: Per pharmacy, do not stock 4 mg tab.  Changed to Cardura  as above.  BP tolerating. Weights stable.  8/27: Blood pressures increasing, weight slightly up.  Patient doing very well, will trend for another day before resuming diuretics.  8/28: Weights down despite decent p.o. intakes and being off of diuretics.  Is developing some mild peripheral edema, so we will hold off on additional fluid boluses for now.  BP stable.      06/27/2024    4:06 PM 06/27/2024    9:55 AM 06/27/2024    4:54 AM  Vitals with BMI  Systolic 126 129 871  Diastolic 65 65 55  Pulse 91 79 74    Filed Weights   06/25/24 0600 06/26/24 0600 06/27/24 0450  Weight: 48.8 kg 48.7 kg 51.8 kg    15. Transaminitis/chronic liver dz/ascites - elevated ALP. Will get GGT as add-on given other LFTs WNL.    - 8-22: GGT 79; likely related to cholestasis. With ongoing nausea will get abdominal US  for workup.    - 8.23: No gallstones or gallbladder dilation; notable R ascites w/ nodular liver and pleural effusion.  Did discuss results with patient and family.   - Will need GI follow up for findings as OP  16.  Central nausea versus central vertigo.   - Oversedated with Compazine , will schedule Zofran  4 mg 3 times daily head of meals/therapies to see if this improves symptoms   - Keep IV Compazine  and secondary for breakthrough   - No nystagmus on exam, but given location of stroke likely contribution of vertigo; will add as needed meclizine  12.5 mg 3 times daily. May need scopolamine patch    - 8/22: No vomitting reported; tolerating therapies much better.  Continue current regimen--continued improvement   -Much improved/resolved  17. R pleural effusion - incidental on abdominal US . ISB Q2H while awake, Dced IVF, continuing to hold diuretices d/t low BP and elevated HR.   18. Anemia.  Iron deficiency based on panel, likely contributed to dilution from antibiotics.  - FOBT negative - Added on Iron studies today--sat 9, iron 22.  Getting IV PRBCs today, would benefit from Venofer once completed.  Start daily iron supplement.  - received 1 U PRBC 8/26; hgb 8.7 8/29--appears stable   18.  Thrombocytosis.  Likely reactive, monitor  - 8-29 some increase to 535k  -recheck Monday   LOS: 10 days A FACE TO FACE EVALUATION WAS PERFORMED  Prentice FORBES Compton 06/27/2024, 4:19 PM

## 2024-06-27 NOTE — Progress Notes (Signed)
 [facility] Progress Note  Subjective:  No major events reported overnight.  Seen at bedside chair.  No family.  Conversational.  Vitals:   06/26/24 1400 06/26/24 2014 06/27/24 0450 06/27/24 0454  BP: (!) 134/52 (!) 131/46  (!) 128/55  Pulse: 88 100  74  Resp: 19 19  19   Temp: 98.8 F (37.1 C) 99.4 F (37.4 C)  99.3 F (37.4 C)  TempSrc: Oral Oral  Oral  SpO2: 100% 100%  100%  Weight:   51.8 kg   Height:        Exam: Gen alert, no distress, elderly female Chest: BL BS, no IWOB Cardiac: RRR Abd soft ntnd  Ext no LE or UE edema Neuro is alert, Ox 3 , nf     Home bp meds: Hydrochlorothiazide  Others: lipitor, nexium , megace  (not taking), remeron , prns       Date                             Creat               eGFR (ml/min) 2019- 2022                  0.69- 1.12 2023                            0.73- 2.13 2024                            0.85- 1.00        57- 69 ml/min May 2025                    2.24 >> 1.52    21- 34 ml/min June 2025                   1.75- 2.91        15- 29 ml/min 06/14/24                        9.52                 4 ml/min       UA: mod Hb, small LE, prot 100, >50 rbc/ >50 wbc/ 0-5 epi UNa, UCr: pend Renal US : 10.9/ 9/9 cm kidneys w/o hydro, ^echotexture   Assessment/ Plan: Hyperkalemia: Previously resolved, now recurrent.  Received Lokelma  10 without improvement.  Does not appear to be on any new medications.  Possibly diet related, however likely minimally contributing, given reported decrease p.o. intake.  Likely her urinary retention is main etiology to her hyperkalemia.  Defer to primary regarding urinary retention.    Potassium slightly improved today.  Will get Lokelma  10 3 times daily and reassess tomorrow. AKI on CKD 3b: Improved.  b/l creatinine 1.5- 1.8 from may.  AKI previously up to 9.5 with urinary retention and likely UTI as well as dehydration and possible tubular injury.  Creatinine steadily improving with Foley  catheter, antibiotics, hydration.  Renal function has improved.  Now with recurrent urinary retention.  Defer to primary. Urinary retention: Management per primary team   Sara [name]  06/27/2024, 8:27 AM  Recent Labs  Lab 06/25/24 0525 06/26/24 0447 06/27/24 0550  HGB 8.3* 8.7*  --   CALCIUM  9.3 9.4 9.5  CREATININE 3.19* 3.19* 2.94*  K 5.9* 5.9* 5.8*   Recent Labs  Lab 06/23/24 1021  IRON 22*  TIBC 255  FERRITIN 133   Inpatient medications:  sodium chloride    Intravenous Once   aspirin   81 mg Oral Daily   atorvastatin   40 mg Oral QHS   bethanechol   25 mg Oral TID   Chlorhexidine  Gluconate Cloth  6 each Topical Daily   doxazosin   2 mg Oral Daily   ferrous sulfate   325 mg Oral Q breakfast   Gerhardt's butt cream   Topical Daily   heparin   5,000 Units Subcutaneous Q8H   lactose free nutrition  237 mL Oral TID WC   megestrol   200 mg Oral BID   melatonin  5 mg Oral QHS   metoprolol  tartrate  25 mg Oral BID   multivitamin with minerals  1 tablet Oral Daily   ondansetron   4 mg Oral TID   polyethylene glycol  17 g Oral BID   senna-docusate  2 tablet Oral BID   thiamine   100 mg Oral Daily     acetaminophen , alum & mag hydroxide-simeth, bisacodyl , diphenhydrAMINE , guaiFENesin -dextromethorphan, hydrocortisone , lidocaine , meclizine , [DISCONTINUED] prochlorperazine  **OR** [DISCONTINUED] prochlorperazine  **OR** prochlorperazine , QUEtiapine , sodium phosphate, traZODone 

## 2024-06-27 NOTE — Plan of Care (Signed)
  Problem: Consults Goal: RH GENERAL PATIENT EDUCATION Description: See Patient Education module for education specifics. Outcome: Progressing   Problem: RH BOWEL ELIMINATION Goal: RH STG MANAGE BOWEL WITH ASSISTANCE Description: STG Manage Bowel with supervision Assistance. Outcome: Progressing   Problem: RH BLADDER ELIMINATION Goal: RH STG MANAGE BLADDER WITH ASSISTANCE Description: STG Manage Bladder With  supervision Assistance Outcome: Progressing   Problem: RH SKIN INTEGRITY Goal: RH STG SKIN FREE OF INFECTION/BREAKDOWN Description: Manage skin free of infection with supervision assistance Outcome: Progressing   Problem: RH SAFETY Goal: RH STG ADHERE TO SAFETY PRECAUTIONS W/ASSISTANCE/DEVICE Description: STG Adhere to Safety Precautions With supervision Assistance/Device. Outcome: Progressing   Problem: RH PAIN MANAGEMENT Goal: RH STG PAIN MANAGED AT OR BELOW PT'S PAIN GOAL Description: <4 w/ prns Outcome: Progressing   Problem: RH KNOWLEDGE DEFICIT GENERAL Goal: RH STG INCREASE KNOWLEDGE OF SELF CARE AFTER HOSPITALIZATION Description: Manage increase knowledge of self care after hospitalization with supervision assistance from  children using educational materials provided Outcome: Progressing

## 2024-06-27 NOTE — Progress Notes (Signed)
 Occupational Therapy Session Note  Patient Details  Name: Sara [name] MRN: [id number] Date of Birth: [birth year]  Today's Date: 06/27/2024 OT Individual Time: 0100-0130 OT Individual Time Calculation (min): 30 min    Short Term Goals: Week 1:  OT Short Term Goal 1 (Week 1): STG=LTG d/t ELOS OT Short Term Goal 1 - Progress (Week 1): Progressing toward goal  Skilled Therapeutic Interventions/Progress Updates:    Patient was seated in the room with her family present at the time of arrival. The pt's family was in the process of leaving and offered their well wishes prior to exiting the room.   The pt was in agreement with completing a BADL related task in showering.   The pt was able to come from seated in the recliner to stand , incorporating the arm of the recliner and the RW at [facility]. The pt was able to enter the restroom for placement on the shower bench with close S. The pt was able to doff her over head shirt with close S, she was able to doff her pull-up and pajama pants with close S. The pt was close S for doffing her non-skid socks. The pt was able to bathe her UB/LB alternating from sit to stand with closeS. The pt was able to exit the shower and donn her over head shirt with s/uA, she was s/uA for donning her pull-up and pants. The pt was s/uA for donning her non-skid sock.  The pt was able to ambulate from the restroom to the recliner with Close S using the RW for additional balance. The pt was able to go from sit to stand using the arm or the recliner at close S. The pt went on to complete UB towel exercise for shld flexion, horizontal abduction, shld rotation, and lg circles 1 set of 10 incorporating relaxation breathing to improve compliance. At the end of the session the bedside table and call light were put in  place with all additional needs addressed prior to exiting the room.   Therapy Documentation Precautions:  Precautions Precautions: Fall Restrictions Weight Bearing  Restrictions Per Provider Order: No   Therapy/Group: Individual Therapy  Elvera JONETTA Mace 06/27/2024, 6:03 PM

## 2024-06-27 NOTE — Progress Notes (Signed)
 Patient agreeable to in and out catheterization. Making attempts to void earlier this morning. Does report having a bowel movement this morning. Reports bowel movement being small in size. No headache reported by her this morning.

## 2024-06-28 LAB — BASIC METABOLIC PANEL WITH GFR
Anion gap: 9 (ref 5–15)
BUN: 49 mg/dL — ABNORMAL HIGH (ref 8–23)
CO2: 20 mmol/L — ABNORMAL LOW (ref 22–32)
Calcium: 8.9 mg/dL (ref 8.9–10.3)
Chloride: 106 mmol/L (ref 98–111)
Creatinine, Ser: 2.88 mg/dL — ABNORMAL HIGH (ref 0.44–1.00)
GFR, Estimated: 16 mL/min — ABNORMAL LOW (ref >60)
Glucose, Bld: 170 mg/dL — ABNORMAL HIGH (ref 70–99)
Potassium: 5.1 mmol/L (ref 3.5–5.1)
Sodium: 135 mmol/L (ref 135–145)

## 2024-06-28 NOTE — Progress Notes (Signed)
 From evening prior to this afternoon. Continues to have large type 6 and type 7 stools. With few episodes of incontinence of bowels.

## 2024-06-28 NOTE — Progress Notes (Signed)
 [facility] Progress Note  Subjective:  No events reported overnight.  Seen in bedside chair.  No family at bedside.  Conversational.  Still requiring straight caths.  Vitals:   06/27/24 1606 06/27/24 1948 06/28/24 0449 06/28/24 0458  BP: 126/65 (!) 143/50 (!) 154/64   Pulse: 91 100 84   Resp: 18 18 16    Temp: 98 F (36.7 C) 99.1 F (37.3 C) 99.9 F (37.7 C)   TempSrc:  Oral    SpO2: 100% 100% 100%   Weight:    50.8 kg  Height:        Exam: Gen alert, no distress, elderly female Chest: BL BS, no IWOB Cardiac: RRR Abd soft ntnd  Ext no LE or UE edema Neuro is alert, Ox 3 , nf     Home bp meds: Hydrochlorothiazide  Others: lipitor, nexium , megace  (not taking), remeron , prns       Date                             Creat               eGFR (ml/min) 2019- 2022                  0.69- 1.12 2023                            0.73- 2.13 2024                            0.85- 1.00        57- 69 ml/min May 2025                    2.24 >> 1.52    21- 34 ml/min June 2025                   1.75- 2.91        15- 29 ml/min 06/14/24                        9.52                 4 ml/min       UA: mod Hb, small LE, prot 100, >50 rbc/ >50 wbc/ 0-5 epi UNa, UCr: pend Renal US : 10.9/ 9/9 cm kidneys w/o hydro, ^echotexture   Assessment/ Plan: Hyperkalemia: Previously resolved, now recurrent.  Received Lokelma  10 without improvement.  Does not appear to be on any new medications.  Possibly diet related, however likely minimally contributing, given reported decrease p.o. intake.  Likely her urinary retention is main etiology to her hyperkalemia.  Defer to primary regarding urinary retention.    Received Lokelma  10 3 times daily  Labs pending  AKI on CKD 3b: Improved.  b/l creatinine 1.5- 1.8 from may.  AKI previously up to 9.5 with urinary retention and likely UTI as well as dehydration and possible tubular injury.  Creatinine steadily improving with Foley catheter, antibiotics,  hydration.  Renal function has improved.  Now with recurrent urinary retention.  Defer to primary. Urinary retention: Management per primary team   Sara [name]  06/28/2024, 8:36 AM  Recent Labs  Lab 06/25/24 0525 06/26/24 0447 06/27/24 0550  HGB 8.3* 8.7*  --   CALCIUM  9.3 9.4 9.5  CREATININE 3.19* 3.19* 2.94*  K 5.9* 5.9* 5.8*   Recent Labs  Lab 06/23/24 1021  IRON 22*  TIBC 255  FERRITIN 133   Inpatient medications:  sodium chloride    Intravenous Once   aspirin   81 mg Oral Daily   atorvastatin   40 mg Oral QHS   bethanechol   25 mg Oral TID   Chlorhexidine  Gluconate Cloth  6 each Topical Daily   doxazosin   2 mg Oral Daily   ferrous sulfate   325 mg Oral Q breakfast   Gerhardt's butt cream   Topical Daily   heparin   5,000 Units Subcutaneous Q8H   lactose free nutrition  237 mL Oral TID WC   megestrol   200 mg Oral BID   melatonin  5 mg Oral QHS   metoprolol  tartrate  25 mg Oral BID   multivitamin with minerals  1 tablet Oral Daily   ondansetron   4 mg Oral TID   polyethylene glycol  17 g Oral BID   senna-docusate  2 tablet Oral BID   sodium zirconium cyclosilicate   10 g Oral TID   thiamine   100 mg Oral Daily     acetaminophen , alum & mag hydroxide-simeth, bisacodyl , diphenhydrAMINE , guaiFENesin -dextromethorphan, hydrocortisone , lidocaine , meclizine , [DISCONTINUED] prochlorperazine  **OR** [DISCONTINUED] prochlorperazine  **OR** prochlorperazine , QUEtiapine , sodium phosphate, traZODone 

## 2024-06-28 NOTE — Plan of Care (Signed)
  Problem: Consults Goal: RH GENERAL PATIENT EDUCATION Description: See Patient Education module for education specifics. Outcome: Progressing   Problem: RH BOWEL ELIMINATION Goal: RH STG MANAGE BOWEL WITH ASSISTANCE Description: STG Manage Bowel with supervision Assistance. Outcome: Progressing   Problem: RH BLADDER ELIMINATION Goal: RH STG MANAGE BLADDER WITH ASSISTANCE Description: STG Manage Bladder With  supervision Assistance Outcome: Progressing   Problem: RH SKIN INTEGRITY Goal: RH STG SKIN FREE OF INFECTION/BREAKDOWN Description: Manage skin free of infection with supervision assistance Outcome: Progressing   Problem: RH SAFETY Goal: RH STG ADHERE TO SAFETY PRECAUTIONS W/ASSISTANCE/DEVICE Description: STG Adhere to Safety Precautions With supervision Assistance/Device. Outcome: Progressing   Problem: RH KNOWLEDGE DEFICIT GENERAL Goal: RH STG INCREASE KNOWLEDGE OF SELF CARE AFTER HOSPITALIZATION Description: Manage increase knowledge of self care after hospitalization with supervision assistance from  children using educational materials provided Outcome: Progressing

## 2024-06-28 NOTE — Plan of Care (Signed)
  Problem: Consults Goal: RH GENERAL PATIENT EDUCATION Description: See Patient Education module for education specifics. Outcome: Progressing   Problem: RH BOWEL ELIMINATION Goal: RH STG MANAGE BOWEL WITH ASSISTANCE Description: STG Manage Bowel with supervision Assistance. Outcome: Progressing   Problem: RH BLADDER ELIMINATION Goal: RH STG MANAGE BLADDER WITH ASSISTANCE Description: STG Manage Bladder With  supervision Assistance Outcome: Progressing   Problem: RH SKIN INTEGRITY Goal: RH STG SKIN FREE OF INFECTION/BREAKDOWN Description: Manage skin free of infection with supervision assistance Outcome: Progressing   Problem: RH SAFETY Goal: RH STG ADHERE TO SAFETY PRECAUTIONS W/ASSISTANCE/DEVICE Description: STG Adhere to Safety Precautions With supervision Assistance/Device. Outcome: Progressing   Problem: RH PAIN MANAGEMENT Goal: RH STG PAIN MANAGED AT OR BELOW PT'S PAIN GOAL Description: <4 w/ prns Outcome: Progressing   Problem: RH KNOWLEDGE DEFICIT GENERAL Goal: RH STG INCREASE KNOWLEDGE OF SELF CARE AFTER HOSPITALIZATION Description: Manage increase knowledge of self care after hospitalization with supervision assistance from  children using educational materials provided Outcome: Progressing

## 2024-06-28 NOTE — Plan of Care (Signed)
  Problem: Consults Goal: RH GENERAL PATIENT EDUCATION Description: See Patient Education module for education specifics. Outcome: Progressing   Problem: RH BOWEL ELIMINATION Goal: RH STG MANAGE BOWEL WITH ASSISTANCE Description: STG Manage Bowel with supervision Assistance. Outcome: Progressing   Problem: RH BLADDER ELIMINATION Goal: RH STG MANAGE BLADDER WITH ASSISTANCE Description: STG Manage Bladder With  supervision Assistance Outcome: Progressing   Problem: RH SKIN INTEGRITY Goal: RH STG SKIN FREE OF INFECTION/BREAKDOWN Description: Manage skin free of infection with supervision assistance Outcome: Progressing   Problem: RH SAFETY Goal: RH STG ADHERE TO SAFETY PRECAUTIONS W/ASSISTANCE/DEVICE Description: STG Adhere to Safety Precautions With supervision Assistance/Device. Outcome: Progressing   Problem: RH PAIN MANAGEMENT Goal: RH STG PAIN MANAGED AT OR BELOW PT'S PAIN GOAL Description: <4 w/ prns Outcome: Progressing

## 2024-06-28 NOTE — Progress Notes (Signed)
 PROGRESS NOTE   Subjective/Complaints: Appreciate nephrology note, additional Lokelma  ordered for elevated potassium Discharged delayed due to hyperkalemia  Daughter at bedside DIscussed bladder emptying and hyperkalemia ROS: Patient denies fever, rash, sore throat, blurred vision, dizziness, nausea, vomiting, diarrhea, cough, shortness of breath or chest pain, joint or back/neck pain, headache, or mood change.   Objective:   No results found.   Recent Labs    06/26/24 0447  WBC 9.9  HGB 8.7*  HCT 26.8*  PLT 535*    Recent Labs    06/26/24 0447 06/27/24 0550  NA 134* 135  K 5.9* 5.8*  CL 105 104  CO2 21* 23  GLUCOSE 143* 118*  BUN 54* 57*  CREATININE 3.19* 2.94*  CALCIUM  9.4 9.5    Intake/Output Summary (Last 24 hours) at 06/28/2024 1335 Last data filed at 06/28/2024 1150 Gross per 24 hour  Intake 660 ml  Output 3600 ml  Net -2940 ml        Physical Exam: Vital Signs Blood pressure (!) 130/58, pulse 75, temperature 99.9 F (37.7 C), resp. rate 16, height 5' 5 (1.651 m), weight 50.8 kg, SpO2 100%.   General: No acute distress Mood and affect are appropriate Heart: Regular rate and rhythm no rubs murmurs or extra sounds Lungs: Clear to auscultation, breathing unlabored, no rales or wheezes Abdomen: Positive bowel sounds, soft nontender to palpation, nondistended Extremities: No clubbing, cyanosis, or edema   Skin: C/D/I. No apparent lesions.  Peripheral IV in place  MSK:      No apparent deformity. Full AROM. No pain     Neurologic exam:  Cognition: AAO to person, place, time and event.  CN nonfocal No dysarthria. Slow processing, stm deficits Insight: Fair insight into current condition.  Strength: Antigravity against resistance all 4 extremities, 4+/5 throughout Sensation: Equal and intact in BL UE and Les.  Reflexes: 2+ in BL UE and LEs.   Coordination: No apparent tremors. No ataxia.     Spasticity: MAS 0 in all extremities.    Assessment/Plan: 1. Functional deficits which require 3+ hours per day of interdisciplinary therapy in a comprehensive inpatient rehab setting. Physiatrist is providing close team supervision and 24 hour management of active medical problems listed below. Physiatrist and rehab team continue to assess barriers to discharge/monitor patient progress toward functional and medical goals  Care Tool:  Bathing    Body parts bathed by patient: Right arm, Left arm, Chest, Abdomen, Front perineal area, Right upper leg, Left upper leg, Face, Left lower leg, Right lower leg, Buttocks   Body parts bathed by helper: Buttocks     Bathing assist Assist Level: Supervision/Verbal cueing     Upper Body Dressing/Undressing Upper body dressing   What is the patient wearing?: Pull over shirt    Upper body assist Assist Level: Independent    Lower Body Dressing/Undressing Lower body dressing      What is the patient wearing?: Pants, Underwear/pull up     Lower body assist Assist for lower body dressing: Supervision/Verbal cueing     Toileting Toileting    Toileting assist Assist for toileting: Supervision/Verbal cueing     Transfers Chair/bed transfer  Transfers assist  Chair/bed transfer assist level: Independent with assistive device Chair/bed transfer assistive device: Geologist, engineering   Ambulation assist      Assist level: Contact Guard/Touching assist Assistive device: No Device Max distance: 400'   Walk 10 feet activity   Assist     Assist level: Contact Guard/Touching assist Assistive device: Walker-rolling   Walk 50 feet activity   Assist    Assist level: Contact Guard/Touching assist Assistive device: Walker-rolling    Walk 150 feet activity   Assist    Assist level: Contact Guard/Touching assist Assistive device: Walker-rolling    Walk 10 feet on uneven surface   activity   Assist     Assist level: Minimal Assistance - Patient > 75%     Wheelchair     Assist Is the patient using a wheelchair?: Yes Type of Wheelchair: Manual    Wheelchair assist level: Dependent - Patient 0% Max wheelchair distance: 150'    Wheelchair 50 feet with 2 turns activity    Assist        Assist Level: Dependent - Patient 0%   Wheelchair 150 feet activity     Assist      Assist Level: Dependent - Patient 0%   Blood pressure (!) 130/58, pulse 75, temperature 99.9 F (37.7 C), resp. rate 16, height 5' 5 (1.651 m), weight 50.8 kg, SpO2 100%.  1. Functional deficits secondary to acute left pontine infarct             -patient may shower             -ELOS/Goals: S 7-8 days - 8/30 DC              -Continue CIR therapies including PT, OT. Finalize dc planning.  Need to reduce K+ as well as determine Lokelma  dose for home use , will nee nephro input Also need to see if pt can void with nl PVRs over the next 24h, otherwise may need foley prior to d/c 2.  Antithrombotics: -DVT/anticoagulation:  Pharmaceutical: Heparin              -antiplatelet therapy: continue Aspirin     3. Pain Management: Tylenol  as needed 4. Mood/Behavior/Sleep: LCSW to follow for evaluation and support when available.              -antipsychotic agents: Seroquel               - Sleep aid: Melatonin scheduled 5. Neuropsych/cognition: This patient is capable of making decisions on her own behalf. 6. Skin/Wound Care: routine pressure relief measures  7. Fluids/Electrolytes/Nutrition: Monitor strict I&O's- follow-up chemistries             -Speech consult  -carb modified diet + boost--continue multivitamin, thiamine   - 8/21: Nutrition consult for poor PO intakes.  Complicated by nausea, may resume Megace  3 times daily tomorrow if no improvements with nausea medication.  - 8/22: Nausea much better on current regimen, however p.o. intakes remain poor.  Resume megace  200 mg  suspension BID - 8-24: P.o. is much improved with Megace , continue current regimen--have slowed down a little 8-29: Hyperkalemia, still 5.9.  Nephrology ordered  lokelma  10mg  TID x 2 d (8/30 and 8/31) Recheck BMP in am If K+ is down tomorrow will need nephro to advise on home dose of lokelma  and whether pt needs an additional day of monitoring prior to discharge     -as per nephro consult 8.  Severe sepsis secondary to complicated UTI: Klebsiella bacteremia             -  On IV Rocephin ----transitioned to Keflex  500 mg x 7 days today 8/20 -tolerating  8-23: UA remains positive.  Patient still having urinary retention.  Will go back to IV Rocephin  1 g daily and extend course through 10 days   - 8-26: CBC improved.  Continues with retention, see below.  8-27: CBC slightly increased, afebrile, continues on treatment.  Thrombocytosis uptrending.  Finish out antibiotic course of 10 days.  8/29: WBC normalized.    9.  AKI-CKD stage IIIb: Renal function gradually improving, avoid nephrotoxic agents.  Baseline appears 1.7-2.2             - Foley catheter in place--begin voiding trial with PVR as renal function improves. (Ordered for 8/22- please check with family if they are ok for this as they were hesitant about removal)             - On lactated Ringer 's??  Continue   - 8/21: Switch from LR 70 cc/hr to 1/2 NS 50 cc/hr for maintenance fluids - monitor Cr daily.  Encouraging patient to drink more water .   - 8/22: BMP today to monitor-much improved, stop maintenance fluids  8-23: P.o. intakes picking up, given I's and O's net negative greater than 1 L, continue to encourage p.o.'s. May need resumption of IVF.   8-24: Creatinine stable off of IV fluids.  Has been getting some with IV antibiotics.  Continue to monitor  8/27: Creatinine continues to improve, gradually.  P.o. fluid intakes approximately 600/day, encouraged at least 1 L.  8/29 Creatine SLOWLY falling   -push fluids   -rx  hyperkalemia   -reconsult nephrology   -Renal U/S 8/17 c/w medical renal disease 10.  Acute urinary retention : Same as above, continue Flomax  and monitor strict I&O     8/28: Finished Rocephin .  Ongoing urinary retention.  Will give sorbitol  today given moderate stool burden on KUB yesterday, if no improvement in urinary function will place Foley for the weekend.  8/29  pt requiring I/O cath for large volume this am (500cc). Has had other larger PVR's   -in the setting of her renal issues, K+, it might be wise to hold her discharge until we work through some of these things.    -increase cardura  to 2mg    -increase urecholine  to 25mg  tid   -oob to bed to void, preferably toilet   -recheck UA/UCX as well   -Renal U/S 8/17 c/w medical renal disease UA with positive WBCs but rare bacteria patient is afebrile, urine culture shows no growth thus far Retention improving PVR 187 this afternoon 11.  Acute left pontine infarct versus artifactual finding on MRI: Continue aspirin  per neurology recommendation.  12.  T2DM: 8/18 A1c 6.6              - Monitor CBG ACHS and continue SSI--allow for permissive hyperglycemia              - Add Semglee  5U daily--improved, monitor with improved p.o. intakes  - BG improved; monitor with megace  initiation and PO intakes above  - 8/26: Has stayed in a relatively good range, 6.6 at goal for age, will DC insulin .  Continue to monitor twice daily. 8/27: Blood sugar in good range, p.o. intakes consistent.  DC blood glucose checks.  13. HLD: continue atorvastatin  40 mg daily   14.  HTN/hypotension/tachycardia/Grade 1 diastolic HF: Monitor BP per protocol--Lasix/HCTZ on hold starting low-dose metoprolol  25 mg twice daily for mild sinus tachycardia- discussed with Dr. Raenelle who  feels tachycardia is physiologic, no hold parameters for therapy recommended             - Monitor for orthostatic hypotension--stable this AM   - 8/22: Mild HTN, trend, may need to add  ACE/ARB pending renal fxn above - would not add diuretic while on continuous IVF  - 8-23: Hypotensive, tachycardic today.   Reduce rapiflo to 4 mg daily. Encourage PO fluids--500 cc bolus + TEDS, Binder ordered.   8-24: Per pharmacy, do not stock 4 mg tab.  Changed to Cardura  as above.  BP tolerating. Weights stable.  8/27: Blood pressures increasing, weight slightly up.  Patient doing very well, will trend for another day before resuming diuretics.  8/28: Weights down despite decent p.o. intakes and being off of diuretics.  Is developing some mild peripheral edema, so we will hold off on additional fluid boluses for now.  BP stable.      06/28/2024   11:16 AM 06/28/2024    4:58 AM 06/28/2024    4:49 AM  Vitals with BMI  Weight  112 lbs   BMI  18.64   Systolic 130  154  Diastolic 58  64  Pulse 75  84   Filed Weights   06/26/24 0600 06/27/24 0450 06/28/24 0458  Weight: 48.7 kg 51.8 kg 50.8 kg    15. Transaminitis/chronic liver dz/ascites - elevated ALP. Will get GGT as add-on given other LFTs WNL.    - 8-22: GGT 79; likely related to cholestasis. With ongoing nausea will get abdominal US  for workup.    - 8.23: No gallstones or gallbladder dilation; notable R ascites w/ nodular liver and pleural effusion.  Did discuss results with patient and family.   - Will need GI follow up for findings as OP  16.  Central nausea versus central vertigo.   - Oversedated with Compazine , will schedule Zofran  4 mg 3 times daily head of meals/therapies to see if this improves symptoms   - Keep IV Compazine  and secondary for breakthrough   - No nystagmus on exam, but given location of stroke likely contribution of vertigo; will add as needed meclizine  12.5 mg 3 times daily. May need scopolamine patch    - 8/22: No vomitting reported; tolerating therapies much better.  Continue current regimen--continued improvement   -Much improved/resolved  17. R pleural effusion - incidental on abdominal US . ISB Q2H while  awake, Dced IVF, continuing to hold diuretices d/t low BP and elevated HR.   18. Anemia.  Iron deficiency based on panel, likely contributed to dilution from antibiotics.  - FOBT negative - Added on Iron studies today--sat 9, iron 22.  Getting IV PRBCs today, would benefit from Venofer once completed.  Start daily iron supplement.  - received 1 U PRBC 8/26; hgb 8.7 8/29--appears stable Recheck in am   18.  Thrombocytosis.  Likely reactive, monitor  - 8-29 some increase to 535k  -recheck Monday  19.  Urinary retention on alpha blocker and urecholine , may need foley place prior to d/c LOS: 11 days A FACE TO FACE EVALUATION WAS PERFORMED  Prentice FORBES Compton 06/28/2024, 1:35 PM

## 2024-06-29 DIAGNOSIS — R7401 Elevation of levels of liver transaminase levels: Secondary | ICD-10-CM | POA: Insufficient documentation

## 2024-06-29 LAB — BASIC METABOLIC PANEL WITH GFR
Anion gap: 7 (ref 5–15)
BUN: 47 mg/dL — ABNORMAL HIGH (ref 8–23)
CO2: 23 mmol/L (ref 22–32)
Calcium: 9.4 mg/dL (ref 8.9–10.3)
Chloride: 108 mmol/L (ref 98–111)
Creatinine, Ser: 2.87 mg/dL — ABNORMAL HIGH (ref 0.44–1.00)
GFR, Estimated: 16 mL/min — ABNORMAL LOW (ref >60)
Glucose, Bld: 97 mg/dL (ref 70–99)
Potassium: 5 mmol/L (ref 3.5–5.1)
Sodium: 138 mmol/L (ref 135–145)

## 2024-06-29 LAB — CBC
HCT: 26.1 % — ABNORMAL LOW (ref 36.0–46.0)
Hemoglobin: 8.7 g/dL — ABNORMAL LOW (ref 12.0–15.0)
MCH: 27.9 pg (ref 26.0–34.0)
MCHC: 33.3 g/dL (ref 30.0–36.0)
MCV: 83.7 fL (ref 80.0–100.0)
Platelets: 498 K/uL — ABNORMAL HIGH (ref 150–400)
RBC: 3.12 MIL/uL — ABNORMAL LOW (ref 3.87–5.11)
RDW: 19.4 % — ABNORMAL HIGH (ref 11.5–15.5)
WBC: 7.8 K/uL (ref 4.0–10.5)
nRBC: 0 % (ref 0.0–0.2)

## 2024-06-29 MED ORDER — BISACODYL 10 MG RE SUPP: 10.0000 mg | Freq: Every day | RECTAL | Status: DC | PRN

## 2024-06-29 MED ORDER — DOXAZOSIN MESYLATE 2 MG PO TABS
2.0000 mg | ORAL_TABLET | Freq: Every day | ORAL | 0 refills | Status: DC
  Filled 2024-06-29: qty 30, 30d supply, fill #0

## 2024-06-29 MED ORDER — POLYETHYLENE GLYCOL 3350 17 GM/SCOOP PO POWD
17.0000 g | Freq: Two times a day (BID) | ORAL | 0 refills | Status: DC
  Filled 2024-06-29: qty 238, 7d supply, fill #0

## 2024-06-29 MED ORDER — MEGESTROL ACETATE 400 MG/10ML PO SUSP
200.0000 mg | Freq: Two times a day (BID) | ORAL | 0 refills | Status: AC
Start: 2024-06-30 — End: ?
  Filled 2024-06-29: qty 240, 24d supply, fill #0

## 2024-06-29 MED ORDER — SENNOSIDES-DOCUSATE SODIUM 8.6-50 MG PO TABS
2.0000 | ORAL_TABLET | Freq: Two times a day (BID) | ORAL | 0 refills | Status: AC
  Filled 2024-06-29: qty 120, 30d supply, fill #0

## 2024-06-29 MED ORDER — HYDROCORTISONE ACETATE 25 MG RE SUPP
25.0000 mg | Freq: Two times a day (BID) | RECTAL | 0 refills | Status: DC | PRN
  Filled 2024-06-29: qty 12, 6d supply, fill #0

## 2024-06-29 MED ORDER — GERHARDT'S BUTT CREAM
1.0000 | TOPICAL_CREAM | Freq: Every day | CUTANEOUS | 0 refills | Status: DC
  Filled 2024-06-29: qty 30, 30d supply, fill #0

## 2024-06-29 MED ORDER — FERROUS SULFATE 325 (65 FE) MG PO TABS
325.0000 mg | ORAL_TABLET | Freq: Every day | ORAL | 0 refills | Status: AC
  Filled 2024-06-29: qty 100, 100d supply, fill #0

## 2024-06-29 MED ORDER — ALUM & MAG HYDROXIDE-SIMETH 200-200-20 MG/5ML PO SUSP: 30.0000 mL | ORAL | Status: DC | PRN

## 2024-06-29 MED ORDER — ACETAMINOPHEN 325 MG PO TABS: 325.0000 mg | ORAL_TABLET | ORAL | Status: DC | PRN

## 2024-06-29 MED ORDER — BETHANECHOL CHLORIDE 25 MG PO TABS
25.0000 mg | ORAL_TABLET | Freq: Three times a day (TID) | ORAL | 0 refills | Status: DC
  Filled 2024-06-29: qty 90, 30d supply, fill #0

## 2024-06-29 MED ORDER — ATORVASTATIN CALCIUM 40 MG PO TABS
40.0000 mg | ORAL_TABLET | Freq: Every day | ORAL | 0 refills | Status: AC
  Filled 2024-06-29: qty 30, 30d supply, fill #0

## 2024-06-29 NOTE — Progress Notes (Signed)
 PROGRESS NOTE   Subjective/Complaints: Pt feeling well. Still retaining urine. She had the problem in the past. Has seen [facility] before. No new issues this morning otherwise. She is comfortable going home with foley.   ROS: Patient denies fever, rash, sore throat, blurred vision, dizziness, nausea, vomiting, diarrhea, cough, shortness of breath or chest pain, joint or back/neck pain, headache, or mood change.    Objective:   No results found.   Recent Labs    06/29/24 0531  WBC 7.8  HGB 8.7*  HCT 26.1*  PLT 498*    Recent Labs    06/28/24 1502 06/29/24 0531  NA 135 138  K 5.1 5.0  CL 106 108  CO2 20* 23  GLUCOSE 170* 97  BUN 49* 47*  CREATININE 2.88* 2.87*  CALCIUM  8.9 9.4    Intake/Output Summary (Last 24 hours) at 06/29/2024 1040 Last data filed at 06/29/2024 0600 Gross per 24 hour  Intake 360 ml  Output 3200 ml  Net -2840 ml        Physical Exam: Vital Signs Blood pressure (!) 121/59, pulse 80, temperature 98 F (36.7 C), resp. rate 16, height 5' 5 (1.651 m), weight 47.9 kg, SpO2 100%.   Constitutional: No distress . Vital signs reviewed. HEENT: NCAT, EOMI, oral membranes moist Neck: supple Cardiovascular: RRR without murmur. No JVD    Respiratory/Chest: CTA Bilaterally without wheezes or rales. Normal effort    GI/Abdomen: BS +, non-tender, non-distended Ext: no clubbing, cyanosis, or edema Psych: pleasant and cooperative  Skin: C/D/I. No apparent lesions.  Peripheral IV in place  MSK:      No apparent deformity. Full AROM. No pain     Neurologic exam:  Cognition: AAO to person, place, time and event. Very appropriate.  CN nonfocal No dysarthria. Slow processing, stm deficits Insight: Fair insight into current condition.  Strength: Antigravity against resistance all 4 extremities, 4+/5 throughout Sensation: Equal and intact in BL UE and Les.  Reflexes: 2+ in BL UE and LEs.    Coordination: No apparent tremors. No ataxia.    Spasticity: MAS 0 in all extremities. Prior neuro assessment is c/w 06/29/2024 exam.    Assessment/Plan: 1. Functional deficits which require 3+ hours per day of interdisciplinary therapy in a comprehensive inpatient rehab setting. Physiatrist is providing close team supervision and 24 hour management of active medical problems listed below. Physiatrist and rehab team continue to assess barriers to discharge/monitor patient progress toward functional and medical goals  Care Tool:  Bathing    Body parts bathed by patient: Right arm, Left arm, Chest, Abdomen, Front perineal area, Right upper leg, Left upper leg, Face, Left lower leg, Right lower leg, Buttocks   Body parts bathed by helper: Buttocks     Bathing assist Assist Level: Supervision/Verbal cueing     Upper Body Dressing/Undressing Upper body dressing   What is the patient wearing?: Pull over shirt    Upper body assist Assist Level: Independent    Lower Body Dressing/Undressing Lower body dressing      What is the patient wearing?: Pants, Underwear/pull up     Lower body assist Assist for lower body dressing: Supervision/Verbal cueing  Toileting Toileting    Toileting assist Assist for toileting: Supervision/Verbal cueing     Transfers Chair/bed transfer  Transfers assist     Chair/bed transfer assist level: Independent with assistive device Chair/bed transfer assistive device: Geologist, engineering   Ambulation assist      Assist level: Contact Guard/Touching assist Assistive device: No Device Max distance: 400'   Walk 10 feet activity   Assist     Assist level: Contact Guard/Touching assist Assistive device: Walker-rolling   Walk 50 feet activity   Assist    Assist level: Contact Guard/Touching assist Assistive device: Walker-rolling    Walk 150 feet activity   Assist    Assist level: Contact Guard/Touching  assist Assistive device: Walker-rolling    Walk 10 feet on uneven surface  activity   Assist     Assist level: Minimal Assistance - Patient > 75%     Wheelchair     Assist Is the patient using a wheelchair?: Yes Type of Wheelchair: Manual    Wheelchair assist level: Dependent - Patient 0% Max wheelchair distance: 150'    Wheelchair 50 feet with 2 turns activity    Assist        Assist Level: Dependent - Patient 0%   Wheelchair 150 feet activity     Assist      Assist Level: Dependent - Patient 0%   Blood pressure (!) 121/59, pulse 80, temperature 98 F (36.7 C), resp. rate 16, height 5' 5 (1.651 m), weight 47.9 kg, SpO2 100%.  1. Functional deficits secondary to acute left pontine infarct             -patient may shower             -ELOS/Goals: DC home 9/2             -will place foley, finalize dc planning today, nephro consult appreciated. 2.  Antithrombotics: -DVT/anticoagulation:  Pharmaceutical: Heparin              -antiplatelet therapy: continue Aspirin     3. Pain Management: Tylenol  as needed 4. Mood/Behavior/Sleep: LCSW to follow for evaluation and support when available.              -antipsychotic agents: Seroquel               - Sleep aid: Melatonin scheduled 5. Neuropsych/cognition: This patient is capable of making decisions on her own behalf. 6. Skin/Wound Care: routine pressure relief measures  7. Fluids/Electrolytes/Nutrition: Monitor strict I&O's- follow-up chemistries             -Speech consult  -carb modified diet + boost--continue multivitamin, thiamine   - 8/21: Nutrition consult for poor PO intakes.  Complicated by nausea, may resume Megace  3 times daily tomorrow if no improvements with nausea medication.  - 8/22: Nausea much better on current regimen, however p.o. intakes remain poor.  Resume megace  200 mg suspension BID - 8-24: P.o. is much improved with Megace , continue current regimen--have slowed down a little 9/1  potassium 5.1 today. Received 10g tid of lokelma  yesterday. None ordered for today  -recheck bmet in am  -pt will need nephro follow up as outpt  -appreciate nephrology help! 8.  Severe sepsis secondary to complicated UTI: Klebsiella bacteremia             - On IV Rocephin ----transitioned to Keflex  500 mg x 7 days today 8/20 -tolerating  8-23: UA remains positive.  Patient still having urinary retention.  Will go back to  IV Rocephin  1 g daily and extend course through 10 days   - 8-26: CBC improved.  Continues with retention, see below.  8-27: CBC slightly increased, afebrile, continues on treatment.  Thrombocytosis uptrending.  Finish out antibiotic course of 10 days.  8/29: WBC normalized.    9.  AKI-CKD stage IIIb: Renal function gradually improving, avoid nephrotoxic agents.  Baseline appears 1.7-2.2             - Foley catheter in place--begin voiding trial with PVR as renal function improves. (Ordered for 8/22- please check with family if they are ok for this as they were hesitant about removal)             - On lactated Ringer 's??  Continue   - 8/21: Switch from LR 70 cc/hr to 1/2 NS 50 cc/hr for maintenance fluids - monitor Cr daily.  Encouraging patient to drink more water .   - 8/22: BMP today to monitor-much improved, stop maintenance fluids  8-23: P.o. intakes picking up, given I's and O's net negative greater than 1 L, continue to encourage p.o.'s. May need resumption of IVF.   8-24: Creatinine stable off of IV fluids.  Has been getting some with IV antibiotics.  Continue to monitor  8/27: Creatinine continues to improve, gradually.  P.o. fluid intakes approximately 600/day, encouraged at least 1 L.  9/1 outpt renal f/u   -bun/cr stable 10.  Acute urinary retention : Same as above, continue Flomax  and monitor strict I&O     8/28: Finished Rocephin .  Ongoing urinary retention.  Will give sorbitol  today given moderate stool burden on KUB yesterday, if no improvement in urinary  function will place Foley for the weekend.  8/29  pt requiring I/O cath for large volume this am (500cc). Has had other larger PVR's   -in the setting of her renal issues, K+, it might be wise to hold her discharge until we work through some of these things.    -increased cardura  to 2mg    -increased urecholine  to 25mg  tid   -oob to bed to void, preferably toilet   -recheck UA/UCX as well   -Renal U/S 8/17 c/w medical renal disease   9/1 UCX no growth   -still requiring fairly regular caths   -dc home with foley tomorrow 11.  Acute left pontine infarct versus artifactual finding on MRI: Continue aspirin  per neurology recommendation.  12.  T2DM: 8/18 A1c 6.6              - Monitor CBG ACHS and continue SSI--allow for permissive hyperglycemia              - Add Semglee  5U daily--improved, monitor with improved p.o. intakes  - BG improved; monitor with megace  initiation and PO intakes above  - 8/26: Has stayed in a relatively good range, 6.6 at goal for age, will DC insulin .  Continue to monitor twice daily. 8/27: Blood sugar in good range, p.o. intakes consistent.  DC blood glucose checks.  13. HLD: continue atorvastatin  40 mg daily   14.  HTN/hypotension/tachycardia/Grade 1 diastolic HF: Monitor BP per protocol--Lasix/HCTZ on hold starting low-dose metoprolol  25 mg twice daily for mild sinus tachycardia- discussed with Dr. Raenelle who feels tachycardia is physiologic, no hold parameters for therapy recommended             - Monitor for orthostatic hypotension--stable this AM   - 8/22: Mild HTN, trend, may need to add ACE/ARB pending renal fxn above - would not add  diuretic while on continuous IVF  - 8-23: Hypotensive, tachycardic today.   Reduce rapiflo to 4 mg daily. Encourage PO fluids--500 cc bolus + TEDS, Binder ordered.   8-24: Per pharmacy, do not stock 4 mg tab.  Changed to Cardura  as above.  BP tolerating. Weights stable.  8/27: Blood pressures increasing, weight slightly up.   Patient doing very well, will trend for another day before resuming diuretics.  9/1 bp controlled      06/29/2024    5:00 AM 06/29/2024    3:02 AM 06/28/2024    7:32 PM  Vitals with BMI  Weight 105 lbs 10 oz    BMI 17.57    Systolic  121 130  Diastolic  59 57  Pulse  80 90   Filed Weights   06/27/24 0450 06/28/24 0458 06/29/24 0500  Weight: 51.8 kg 50.8 kg 47.9 kg    15. Transaminitis/chronic liver dz/ascites - elevated ALP. Will get GGT as add-on given other LFTs WNL.    - 8-22: GGT 79; likely related to cholestasis. With ongoing nausea will get abdominal US  for workup.    - 8.23: No gallstones or gallbladder dilation; notable R ascites w/ nodular liver and pleural effusion.  Did discuss results with patient and family.   - Will need GI follow up for findings as OP  16.  Central nausea versus central vertigo.   - Oversedated with Compazine , will schedule Zofran  4 mg 3 times daily head of meals/therapies to see if this improves symptoms   - Keep IV Compazine  and secondary for breakthrough   - No nystagmus on exam, but given location of stroke likely contribution of vertigo; will add as needed meclizine  12.5 mg 3 times daily. May need scopolamine patch    - 8/22: No vomitting reported; tolerating therapies much better.  Continue current regimen--continued improvement   -Much improved/resolved  17. R pleural effusion - incidental on abdominal US . ISB Q2H while awake, Dced IVF, continuing to hold diuretices d/t low BP and elevated HR.   18. Anemia.  Iron deficiency based on panel, likely contributed to dilution from antibiotics.  - FOBT negative - Added on Iron studies today--sat 9, iron 22.  Getting IV PRBCs today, would benefit from Venofer once completed.  Start daily iron supplement.  - received 1 U PRBC 8/26; hgb 8.7 8/29--appears stable Recheck in am   18.  Thrombocytosis.  Likely reactive, monitor  - 9/1 sl improvement to 498k   LOS: 12 days A FACE TO FACE EVALUATION WAS  PERFORMED  Sara [name] 06/29/2024, 10:40 AM

## 2024-06-29 NOTE — Progress Notes (Addendum)
 Per report and information by patient expressed history of having an indwelling foley catheter placement. Acknowledged awareness on taking care of indwelling foley catheter placement due to past work history. Slight peripheral edema in lower extremities/ankles. Feet are dry and cracked.

## 2024-06-29 NOTE — Progress Notes (Signed)
 Patient ID: Sara [name], female   DOB: 1940-11-09, 83 y.o.   MRN: [id number]  Per MD, pt will d/c tomorrow due to continued urinary retention. Pt will d/c to home with foley cath and have outpatient urology follow-up.   Graeme Jude, MSW, LCSW Office: [phone] Cell: [phone] Fax: [phone]

## 2024-06-29 NOTE — Progress Notes (Signed)
 [facility] Progress Note    Assessment/ Plan:   Hyperkalemia, resolved -resolved, suspect urinary retention as culprit. Off lokelma  now -recommend RD consult to discuss low K diet  AKI on CKD3b -b/l Cr 1.5-1.8. AKI secondary to retention/sepsis secondary to UTI. Renal function steadily improving with a Cr of 2.87, nonoliguric -Avoid nephrotoxic medications including NSAIDs and iodinated intravenous contrast exposure unless the latter is absolutely indicated.  Preferred narcotic agents for pain control are hydromorphone , fentanyl , and methadone. Morphine should not be used. Avoid Baclofen and avoid oral sodium phosphate and magnesium  citrate based laxatives / bowel preps. Continue strict Input and Output monitoring. Will monitor the patient closely with you and intervene or adjust therapy as indicated by changes in clinical status/labs   Urinary retention -now with foley -per primary service  Anemia -transfuse PRN for hgb <7. Hgb stable 8.7 today  Will need to establish care with us  post discharge, will arrange for follow up in about 1-2 weeks. Recommend interim labs with PCP post discharge.  Ephriam Stank, MD [facility]   Subjective:   Patient seen and examined bedside. No acute events/complaints. With foley catheter.   Objective:   BP (!) 121/59 (BP Location: Left Arm)   Pulse 80   Temp 98 F (36.7 C)   Resp 16   Ht 5' 5 (1.651 m)   Wt 47.9 kg   SpO2 100%   BMI 17.57 kg/m   Intake/Output Summary (Last 24 hours) at 06/29/2024 0942 Last data filed at 06/29/2024 0600 Gross per 24 hour  Intake 360 ml  Output 3200 ml  Net -2840 ml   Weight change: -2.9 kg  Physical Exam: Gen: NAD, sitting in recliner CVS: RRR Resp: CTA B/L Abd: soft, NT/ND Ext: no edema Neuro: awake, alert  Imaging: No results found.  Labs: BMET Recent Labs  Lab 06/25/24 0525 06/26/24 0447 06/27/24 0550 06/28/24 1502 06/29/24 0531  NA 137 134* 135 135 138   K 5.9* 5.9* 5.8* 5.1 5.0  CL 107 105 104 106 108  CO2 22 21* 23 20* 23  GLUCOSE 112* 143* 118* 170* 97  BUN 52* 54* 57* 49* 47*  CREATININE 3.19* 3.19* 2.94* 2.88* 2.87*  CALCIUM  9.3 9.4 9.5 8.9 9.4   CBC Recent Labs  Lab 06/23/24 0454 06/24/24 0439 06/25/24 0525 06/26/24 0447 06/29/24 0531  WBC 10.4 11.3* 10.2 9.9 7.8  NEUTROABS 5.9  --   --  5.4  --   HGB 6.9* 8.8* 8.3* 8.7* 8.7*  HCT 21.0* 25.4* 25.0* 26.8* 26.1*  MCV 78.9* 79.6* 82.8 83.2 83.7  PLT 423* 456* 460* 535* 498*    Medications:     sodium chloride    Intravenous Once   aspirin   81 mg Oral Daily   atorvastatin   40 mg Oral QHS   bethanechol   25 mg Oral TID   Chlorhexidine  Gluconate Cloth  6 each Topical Daily   doxazosin   2 mg Oral Daily   ferrous sulfate   325 mg Oral Q breakfast   Gerhardt's butt cream   Topical Daily   heparin   5,000 Units Subcutaneous Q8H   lactose free nutrition  237 mL Oral TID WC   megestrol   200 mg Oral BID   melatonin  5 mg Oral QHS   metoprolol  tartrate  25 mg Oral BID   multivitamin with minerals  1 tablet Oral Daily   ondansetron   4 mg Oral TID   polyethylene glycol  17 g Oral BID   senna-docusate  2 tablet Oral BID   thiamine   100 mg Oral Daily      Ephriam Stank, MD [facility] 06/29/2024, 9:42 AM

## 2024-06-29 NOTE — Progress Notes (Signed)
 Occupational Therapy Session Note  Patient Details  Name: Sara [name] MRN: [id number] Date of Birth: [birth year]  Today's Date: 06/29/2024 OT Individual Time: [phone]  [id number] OT Individual Time Calculation (min): 133 min    Short Term Goals: Week 2:  OT Short Term Goal 1 (Week 2): STG= LTG d/t ELOS  Skilled Therapeutic Interventions/Progress Updates:  Session 1: Patient received stting up with nursing taking morning medicine. Patient agreeabl to OT treatmetn including shower. Patient seen for full ADL's as listed below. Continued treatment with practice performing tub bench transfer and bringing feet over tub threshold. Patient reports daughter talked about getting a bench to use at home.  Session 2:Patient received sitting up in recliner. Agreeable to OT treatment. Patient seen for endurance and functional mobility tasks. Patient reports having no concerns with pending discharge home and will return to hobbies and social activities with family present to assist as needed. Mobility with the rollator navigating doorways, entering an elevator and going up a ramp performed with good safety awareness and verbalization of areas in the community that can pose a risk for falls. Patient with good endurance but is aware of ability to use the rollator to take breaks as often as needed. Continued therapy treatment working on HEP with red T-band. Patient encouraged to return to engaging household tasks, but to supplement with HEP to ensure she continues to maintain good strength throughout UE'S. Patient strength assessed and she displays good mobility and strength throughout BUE's. No sensory deficits noted. Patient continued endurance activity with sanding use of UBE 2 x 5 minutes. Administered BIMs during rest break between 2 sets on UBE with 3/3 item recall and recall of date and situation questions. Patient walked back to her room without any assist required. Returned to recliner and chair alarm put in  place. Patient with call bell and cell phone in reach.      Therapy Documentation Precautions:  Precautions Precautions: Fall Restrictions Weight Bearing Restrictions Per Provider Order: No General:   Vital Signs:   Pain:No c/o pain   ADL: ADL Equipment Provided:  patient using rollator for in room moblity Eating: Independent/ Mod I with chin tuck Where Assessed-Eating: Chair Grooming: Independent seated Where Assessed-Grooming: Sitting at sink Upper Body Bathing: Supervision/safety Where Assessed-Upper Body Bathing: Shower Lower Body Bathing: Supervision/safety Where Assessed-Lower Body Bathing: Shower Upper Body Dressing: Supervision/safety Where Assessed-Upper Body Dressing: Sitting at sink Lower Body Dressing: Supervision/safety Where Assessed-Lower Body Dressing: Sitting at sink Toileting: Supervision/safety Where Assessed-Toileting: Teacher, adult education: Distant supervision Statistician Method: Proofreader: Acupuncturist: Distant supervision Film/video editor Method: Designer, industrial/product: Emergency planning/management officer      Therapy/Group: Individual Therapy  Sara [name] 06/29/2024, 2:34 PM

## 2024-06-30 ENCOUNTER — Other Ambulatory Visit (HOSPITAL_COMMUNITY): Payer: Self-pay

## 2024-06-30 DIAGNOSIS — N1832 Chronic kidney disease, stage 3b: Secondary | ICD-10-CM

## 2024-06-30 LAB — BASIC METABOLIC PANEL WITH GFR
Anion gap: 9 (ref 5–15)
BUN: 49 mg/dL — ABNORMAL HIGH (ref 8–23)
CO2: 22 mmol/L (ref 22–32)
Calcium: 9.3 mg/dL (ref 8.9–10.3)
Chloride: 106 mmol/L (ref 98–111)
Creatinine, Ser: 2.85 mg/dL — ABNORMAL HIGH (ref 0.44–1.00)
GFR, Estimated: 16 mL/min — ABNORMAL LOW (ref >60)
Glucose, Bld: 103 mg/dL — ABNORMAL HIGH (ref 70–99)
Potassium: 5.6 mmol/L — ABNORMAL HIGH (ref 3.5–5.1)
Sodium: 137 mmol/L (ref 135–145)

## 2024-06-30 MED ORDER — LOKELMA 10 G PO PACK
10.0000 g | PACK | ORAL | 0 refills | Status: DC
  Filled 2024-06-30: qty 13, 30d supply, fill #0

## 2024-06-30 MED ORDER — SODIUM ZIRCONIUM CYCLOSILICATE 10 G PO PACK: 10.0000 g | PACK | ORAL | Status: DC

## 2024-06-30 MED ORDER — SODIUM ZIRCONIUM CYCLOSILICATE 10 G PO PACK
10.0000 g | PACK | Freq: Once | ORAL | Status: AC
  Administered 2024-06-30: 10 g via ORAL
  Filled 2024-06-30: qty 1

## 2024-06-30 MED ORDER — SODIUM ZIRCONIUM CYCLOSILICATE 10 G PO PACK
10.0000 g | PACK | ORAL | 0 refills | Status: DC
  Filled 2024-06-30: qty 9, 21d supply, fill #0

## 2024-06-30 MED ORDER — ORAL CARE MOUTH RINSE: 15.0000 mL | OROMUCOSAL | Status: DC | PRN

## 2024-06-30 NOTE — Progress Notes (Signed)
 Foley Catheter placed. Provided pt education.

## 2024-06-30 NOTE — Progress Notes (Signed)
 Inpatient Rehabilitation Care Coordinator Discharge Note   Patient Details  Name: Sara [name] MRN: [id number] Date of Birth: 1941/10/21   Discharge location: D/c to home  Length of Stay: 12 days  Discharge activity level: Mod I  Home/community participation: Limited  Patient response un:Yzjouy Literacy - How often do you need to have someone help you when you read instructions, pamphlets, or other written material from your doctor or pharmacy?: Never  Patient response un:Dnrpjo Isolation - How often do you feel lonely or isolated from those around you?: Rarely  Services provided included: MD, RD, PT, OT, SLP, RN, CM, TR, Pharmacy, Neuropsych, SW  Financial Services:  Field seismologist Utilized: Private Insurance [facility] Medicare  Choices offered to/list presented to: patient and pt family  Follow-up services arranged:  Outpatient    Outpatient Servicies: [facility] for PT/OT/SLP      Patient response to transportation need: Is the patient able to respond to transportation needs?: Yes In the past 12 months, has lack of transportation kept you from medical appointments or from getting medications?: No In the past 12 months, has lack of transportation kept you from meetings, work, or from getting things needed for daily living?: No   Patient/Family verbalized understanding of follow-up arrangements:  Yes  Individual responsible for coordination of the follow-up plan: contact pt dtr Hope  Confirmed correct DME delivered: Sara [name] 06/30/2024    Comments (or additional information):fam edu completed  Summary of Stay    Date/Time Discharge Planning CSW  06/23/24 1023 Pt will d/c to home with her dtr who works during the day. Pt has another dtr that can provide intermittent supervision. Fam edu Tues 1pm-4pm. SW will confirm there are no barriers to discharge. AAC       Josselyn Harkins A [name]

## 2024-06-30 NOTE — Progress Notes (Signed)
 Home supplies given to patient . Patient care education done.

## 2024-06-30 NOTE — Plan of Care (Signed)
  Problem: RH Swallowing Goal: LTG Patient will consume least restrictive diet using compensatory strategies with assistance (SLP) Description: LTG:  Patient will consume least restrictive diet using compensatory strategies with assistance (SLP) Outcome: Completed/Met   Problem: RH Comprehension Communication Goal: LTG Patient will comprehend basic/complex auditory (SLP) Description: LTG: Patient will comprehend basic/complex auditory information with cues (SLP). Outcome: Completed/Met   Problem: RH Expression Communication Goal: LTG Patient will increase word finding of common (SLP) Description: LTG:  Patient will increase word finding of common objects/daily info/abstract thoughts with cues using compensatory strategies (SLP). Outcome: Completed/Met   Problem: RH Problem Solving Goal: LTG Patient will demonstrate problem solving for (SLP) Description: LTG:  Patient will demonstrate problem solving for basic/complex daily situations with cues  (SLP) Outcome: Completed/Met   Problem: RH Memory Goal: LTG Patient will use memory compensatory aids to (SLP) Description: LTG:  Patient will use memory compensatory aids to recall biographical/new, daily complex information with cues (SLP) Outcome: Completed/Met   Problem: RH Attention Goal: LTG Patient will demonstrate this level of attention during functional activites (SLP) Description: LTG:  Patient will will demonstrate this level of attention during functional activites (SLP) Outcome: Completed/Met

## 2024-06-30 NOTE — Progress Notes (Signed)
 [facility] Progress Note    Assessment/ Plan:   Hyperkalemia -improved, K back up to 5.6. Had a lengthy discussion about low K diet. I see that she received lokelma . Moving forward, can discharge on lokelma  10g three times weekly (MWF), we can further manage this as an outpatient  AKI on CKD3b -b/l Cr 1.5-1.8. AKI secondary to retention/sepsis secondary to UTI. Renal function stable-Cr of 2.85, remains nonoliguric -Avoid nephrotoxic medications including NSAIDs and iodinated intravenous contrast exposure unless the latter is absolutely indicated.  Preferred narcotic agents for pain control are hydromorphone , fentanyl , and methadone. Morphine should not be used. Avoid Baclofen and avoid oral sodium phosphate and magnesium  citrate based laxatives / bowel preps. Continue strict Input and Output monitoring. Will monitor the patient closely with you and intervene or adjust therapy as indicated by changes in clinical status/labs   Urinary retention -now with foley -per primary service  Anemia -transfuse PRN for hgb <7. Hgb stable 8.7 today  Okay with discharge from an inpatient nephrology perspective. Will arrange for follow up at the office.  Ephriam Stank, MD [facility]   Subjective:   Patient seen and examined bedside. No acute events/complaints. Eager to go home today. UOP 2.2L   Objective:   BP 129/61 (BP Location: Left Arm)   Pulse 75   Temp 98.4 F (36.9 C) (Oral)   Resp 18   Ht 5' 5 (1.651 m)   Wt 47.9 kg   SpO2 100%   BMI 17.57 kg/m   Intake/Output Summary (Last 24 hours) at 06/30/2024 0844 Last data filed at 06/30/2024 0640 Gross per 24 hour  Intake 960 ml  Output 2200 ml  Net -1240 ml   Weight change:   Physical Exam: Gen: NAD, sitting in recliner CVS: RRR Resp: CTA B/L Abd: soft, NT/ND Ext: no edema Neuro: awake, alert  Imaging: No results found.  Labs: BMET Recent Labs  Lab 06/25/24 0525 06/26/24 0447 06/27/24 0550  06/28/24 1502 06/29/24 0531 06/30/24 0503  NA 137 134* 135 135 138 137  K 5.9* 5.9* 5.8* 5.1 5.0 5.6*  CL 107 105 104 106 108 106  CO2 22 21* 23 20* 23 22  GLUCOSE 112* 143* 118* 170* 97 103*  BUN 52* 54* 57* 49* 47* 49*  CREATININE 3.19* 3.19* 2.94* 2.88* 2.87* 2.85*  CALCIUM  9.3 9.4 9.5 8.9 9.4 9.3   CBC Recent Labs  Lab 06/24/24 0439 06/25/24 0525 06/26/24 0447 06/29/24 0531  WBC 11.3* 10.2 9.9 7.8  NEUTROABS  --   --  5.4  --   HGB 8.8* 8.3* 8.7* 8.7*  HCT 25.4* 25.0* 26.8* 26.1*  MCV 79.6* 82.8 83.2 83.7  PLT 456* 460* 535* 498*    Medications:     sodium chloride    Intravenous Once   aspirin   81 mg Oral Daily   atorvastatin   40 mg Oral QHS   bethanechol   25 mg Oral TID   Chlorhexidine  Gluconate Cloth  6 each Topical Daily   doxazosin   2 mg Oral Daily   ferrous sulfate   325 mg Oral Q breakfast   Gerhardt's butt cream   Topical Daily   heparin   5,000 Units Subcutaneous Q8H   lactose free nutrition  237 mL Oral TID WC   megestrol   200 mg Oral BID   melatonin  5 mg Oral QHS   metoprolol  tartrate  25 mg Oral BID   multivitamin with minerals  1 tablet Oral Daily   ondansetron   4 mg Oral  TID   polyethylene glycol  17 g Oral BID   senna-docusate  2 tablet Oral BID   thiamine   100 mg Oral Daily      Ephriam Stank, MD [facility] 06/30/2024, 8:44 AM

## 2024-06-30 NOTE — Plan of Care (Signed)
  Problem: Consults Goal: RH GENERAL PATIENT EDUCATION Description: See Patient Education module for education specifics. Outcome: Progressing   Problem: RH BOWEL ELIMINATION Goal: RH STG MANAGE BOWEL WITH ASSISTANCE Description: STG Manage Bowel with supervision Assistance. Outcome: Progressing   Problem: RH BLADDER ELIMINATION Goal: RH STG MANAGE BLADDER WITH ASSISTANCE Description: STG Manage Bladder With  supervision Assistance Outcome: Progressing   Problem: RH SKIN INTEGRITY Goal: RH STG SKIN FREE OF INFECTION/BREAKDOWN Description: Manage skin free of infection with supervision assistance Outcome: Progressing   Problem: RH SAFETY Goal: RH STG ADHERE TO SAFETY PRECAUTIONS W/ASSISTANCE/DEVICE Description: STG Adhere to Safety Precautions With supervision Assistance/Device. Outcome: Progressing   Problem: RH PAIN MANAGEMENT Goal: RH STG PAIN MANAGED AT OR BELOW PT'S PAIN GOAL Description: <4 w/ prns Outcome: Progressing   Problem: RH KNOWLEDGE DEFICIT GENERAL Goal: RH STG INCREASE KNOWLEDGE OF SELF CARE AFTER HOSPITALIZATION Description: Manage increase knowledge of self care after hospitalization with supervision assistance from  children using educational materials provided Outcome: Progressing

## 2024-06-30 NOTE — Progress Notes (Signed)
 Inpatient Rehabilitation Discharge Medication Review by a Pharmacist  A complete drug regimen review was completed for this patient to identify any potential clinically significant medication issues.  High Risk Drug Classes Is patient taking? Indication by Medication  Antipsychotic Yes Seroquel : mood  Anticoagulant No   Antibiotic No   Opioid No   Antiplatelet Yes Aspirin : CVA ppx  Hypoglycemics/insulin  No   Vasoactive Medication Yes Lopressor : HTN/tachycardia/HF Doxazosin : urinary retention  Chemotherapy No   Other Yes Tylenol : pain Megace : appetite Lipitor: HLD Lokelma : hyperkalemia Mylanta: indigestion Bisacodyl , Miralax , Senokot: constipation Ferrous Sulfate , vitD3, MVI, thiamine : vitamin/supplement Melatonin: sleep Zyrtec : allergies Anusol : hemorrhoids     Type of Medication Issue Identified Description of Issue Recommendation(s)  Drug Interaction(s) (clinically significant)     Duplicate Therapy     Allergy     No Medication Administration End Date     Incorrect Dose     Additional Drug Therapy Needed     Significant med changes from prior encounter (inform family/care partners about these prior to discharge). New Lokelma  on Monday, Wednesday and Friday until follow up with Nephrology Restart or discontinue as appropriate. Communicate medication changes with patient/family at discharge  Other  Discontinue bethanechol      Clinically significant medication issues were identified that warrant physician communication and completion of prescribed/recommended actions by midnight of the next day:  Yes, now resolved  Name of provider notified for urgent issues identified: Arthea Gunther, MD   Provider Method of Notification: Secure chat    Pharmacist comments: discontinued bethanechol  but ordered on discharge. MD to contact patient.  Time spent performing this drug regimen review (minutes): 45  Thank you for allowing pharmacy to be a part of this patient's care.    Bascom JAYSON Louder, PharmD 06/30/2024 7:39 AM   **Pharmacist phone directory can be found on [email or web address] listed under [facility]**

## 2024-06-30 NOTE — Progress Notes (Addendum)
 PROGRESS NOTE   Subjective/Complaints: Pt without complaints. Ready to go home!! Daughter is here. I/O caths ongoing  ROS: Patient denies fever, rash, sore throat, blurred vision, dizziness, nausea, vomiting, diarrhea, cough, shortness of breath or chest pain, joint or back/neck pain, headache, or mood change.    Objective:   No results found.   Recent Labs    06/29/24 0531  WBC 7.8  HGB 8.7*  HCT 26.1*  PLT 498*    Recent Labs    06/29/24 0531 06/30/24 0503  NA 138 137  K 5.0 5.6*  CL 108 106  CO2 23 22  GLUCOSE 97 103*  BUN 47* 49*  CREATININE 2.87* 2.85*  CALCIUM  9.4 9.3    Intake/Output Summary (Last 24 hours) at 06/30/2024 0935 Last data filed at 06/30/2024 0640 Gross per 24 hour  Intake 960 ml  Output 2200 ml  Net -1240 ml        Physical Exam: Vital Signs Blood pressure 129/61, pulse 75, temperature 98.4 F (36.9 C), temperature source Oral, resp. rate 18, height 5' 5 (1.651 m), weight 47.9 kg, SpO2 100%.   Constitutional: No distress . Vital signs reviewed. HEENT: NCAT, EOMI, oral membranes moist Neck: supple Cardiovascular: RRR without murmur. No JVD    Respiratory/Chest: CTA Bilaterally without wheezes or rales. Normal effort    GI/Abdomen: BS +, non-tender, non-distended Ext: no clubbing, cyanosis, or edema Psych: pleasant and cooperative  Skin: C/D/I. No apparent lesions.    MSK:      No apparent deformity. Full AROM. No pain     Neurologic exam:  Cognition: AAO to person, place, time and event. Very appropriate.  CN nonfocal No dysarthria. Improved processing.  Insight: Fair insight into current condition.  Strength: Antigravity against resistance all 4 extremities, 4+/5 throughout Sensation: Equal and intact in BL UE and Les.  Reflexes: 2+ in BL UE and LEs.   Coordination: No apparent tremors. No ataxia.    Spasticity: MAS 0 in all extremities. Prior neuro assessment is c/w  06/30/2024 exam.    Assessment/Plan: 1. Functional deficits which require 3+ hours per day of interdisciplinary therapy in a comprehensive inpatient rehab setting. Physiatrist is providing close team supervision and 24 hour management of active medical problems listed below. Physiatrist and rehab team continue to assess barriers to discharge/monitor patient progress toward functional and medical goals  Care Tool:  Bathing    Body parts bathed by patient: Right arm, Left arm, Chest, Abdomen, Front perineal area, Right upper leg, Left upper leg, Face, Left lower leg, Right lower leg, Buttocks   Body parts bathed by helper: Buttocks     Bathing assist Assist Level: Supervision/Verbal cueing     Upper Body Dressing/Undressing Upper body dressing   What is the patient wearing?: Pull over shirt    Upper body assist Assist Level: Independent    Lower Body Dressing/Undressing Lower body dressing      What is the patient wearing?: Pants, Underwear/pull up     Lower body assist Assist for lower body dressing: Supervision/Verbal cueing     Toileting Toileting    Toileting assist Assist for toileting: Supervision/Verbal cueing     Transfers Chair/bed  transfer  Transfers assist     Chair/bed transfer assist level: Independent with assistive device Chair/bed transfer assistive device: Geologist, engineering   Ambulation assist      Assist level: Independent with assistive device Assistive device: Rollator Max distance: 300'   Walk 10 feet activity   Assist     Assist level: Independent with assistive device Assistive device: Rollator   Walk 50 feet activity   Assist    Assist level: Independent with assistive device Assistive device: Rollator    Walk 150 feet activity   Assist    Assist level: Independent with assistive device Assistive device: Rollator    Walk 10 feet on uneven surface  activity   Assist     Assist level:  Independent with assistive device Assistive device: Rollator   Wheelchair     Assist Is the patient using a wheelchair?: No Type of Wheelchair: Manual    Wheelchair assist level: Dependent - Patient 0% Max wheelchair distance: 150'    Wheelchair 50 feet with 2 turns activity    Assist        Assist Level: Dependent - Patient 0%   Wheelchair 150 feet activity     Assist      Assist Level: Dependent - Patient 0%   Blood pressure 129/61, pulse 75, temperature 98.4 F (36.9 C), temperature source Oral, resp. rate 18, height 5' 5 (1.651 m), weight 47.9 kg, SpO2 100%.  1. Functional deficits secondary to acute left pontine infarct             -patient may shower             -dc home today. Home with foley, HH -needs f/u appt with outpt urology, neurology, nephrology, GI, and [facility] 2.  Antithrombotics: -DVT/anticoagulation:  Pharmaceutical: Heparin              -antiplatelet therapy: continue Aspirin     3. Pain Management: Tylenol  as needed 4. Mood/Behavior/Sleep: LCSW to follow for evaluation and support when available.              -antipsychotic agents: Seroquel               - Sleep aid: Melatonin scheduled 5. Neuropsych/cognition: This patient is capable of making decisions on her own behalf. 6. Skin/Wound Care: routine pressure relief measures  7. Fluids/Electrolytes/Nutrition: Monitor strict I&O's- follow-up chemistries             -Speech consult  -carb modified diet + boost--continue multivitamin, thiamine   - 8/21: Nutrition consult for poor PO intakes.  Complicated by nausea, may resume Megace  3 times daily tomorrow if no improvements with nausea medication.  - 8/22: Nausea much better on current regimen, however p.o. intakes remain poor.  Resume megace  200 mg suspension BID - 8-24: P.o. is much improved with Megace , continue current regimen--have slowed down a little 9/2 potassium 5.6 today.  -gave 10g lokelma  this am -nephrology recommends 10g tid  MWF -outpt nephrology follow up 8.  Severe sepsis secondary to complicated UTI: Klebsiella bacteremia             - On IV Rocephin ----transitioned to Keflex  500 mg x 7 days today 8/20 -tolerating  8-23: UA remains positive.  Patient still having urinary retention.  Will go back to IV Rocephin  1 g daily and extend course through 10 days   - 8-26: CBC improved.  Continues with retention, see below.  8-27: CBC slightly increased, afebrile, continues on treatment.  Thrombocytosis uptrending.  Finish out antibiotic course of 10 days.  8/29: WBC normalized.    9.  AKI-CKD stage IIIb: Renal function gradually improving, avoid nephrotoxic agents.  Baseline appears 1.7-2.2             - Foley catheter in place--begin voiding trial with PVR as renal function improves. (Ordered for 8/22- please check with family if they are ok for this as they were hesitant about removal)             - On lactated Ringer 's??  Continue   - 8/21: Switch from LR 70 cc/hr to 1/2 NS 50 cc/hr for maintenance fluids - monitor Cr daily.  Encouraging patient to drink more water .   - 8/22: BMP today to monitor-much improved, stop maintenance fluids  8-23: P.o. intakes picking up, given I's and O's net negative greater than 1 L, continue to encourage p.o.'s. May need resumption of IVF.   8-24: Creatinine stable off of IV fluids.  Has been getting some with IV antibiotics.  Continue to monitor  8/27: Creatinine continues to improve, gradually.  P.o. fluid intakes approximately 600/day, encouraged at least 1 L.  9/2 outpt renal f/u as above   -bun/cr stable 10.  Acute urinary retention : Same as above, continue Flomax  and monitor strict I&O     8/28: Finished Rocephin .  Ongoing urinary retention.  Will give sorbitol  today given moderate stool burden on KUB yesterday, if no improvement in urinary function will place Foley for the weekend.  8/29  pt requiring I/O cath for large volume this am (500cc). Has had other larger PVR's   -in  the setting of her renal issues, K+, it might be wise to hold her discharge until we work through some of these things.    -increased cardura  to 2mg    -increased urecholine  to 25mg  tid   -oob to bed to void, preferably toilet   -recheck UA/UCX as well   -Renal U/S 8/17 c/w medical renal disease   9/2 UCX no growth   -place foley today for ongoing retention   -outpt urology   -dc urecholine , continue cardua 11.  Acute left pontine infarct versus artifactual finding on MRI: Continue aspirin  per neurology recommendation.  12.  T2DM: 8/18 A1c 6.6              - Monitor CBG ACHS and continue SSI--allow for permissive hyperglycemia              - Add Semglee  5U daily--improved, monitor with improved p.o. intakes  - BG improved; monitor with megace  initiation and PO intakes above  - 8/26: Has stayed in a relatively good range, 6.6 at goal for age, will DC insulin .  Continue to monitor twice daily. 8/27: Blood sugar in good range, p.o. intakes consistent.  DC blood glucose checks.  13. HLD: continue atorvastatin  40 mg daily   14.  HTN/hypotension/tachycardia/Grade 1 diastolic HF: Monitor BP per protocol--Lasix/HCTZ on hold starting low-dose metoprolol  25 mg twice daily for mild sinus tachycardia- discussed with Dr. Raenelle who feels tachycardia is physiologic, no hold parameters for therapy recommended             - Monitor for orthostatic hypotension--stable this AM   - 8/22: Mild HTN, trend, may need to add ACE/ARB pending renal fxn above - would not add diuretic while on continuous IVF  - 8-23: Hypotensive, tachycardic today.   Reduce rapiflo to 4 mg daily. Encourage PO fluids--500 cc bolus + TEDS, Binder ordered.  8-24: Per pharmacy, do not stock 4 mg tab.  Changed to Cardura  as above.  BP tolerating. Weights stable.  8/27: Blood pressures increasing, weight slightly up.  Patient doing very well, will trend for another day before resuming diuretics.  9/2 bp controlled      06/30/2024     5:34 AM 06/29/2024    8:37 PM 06/29/2024    7:57 PM  Vitals with BMI  Systolic 129 127 872  Diastolic 61 52 52  Pulse 75 95 95   Filed Weights   06/27/24 0450 06/28/24 0458 06/29/24 0500  Weight: 51.8 kg 50.8 kg 47.9 kg    15. Transaminitis/chronic liver dz/ascites - elevated ALP. Will get GGT as add-on given other LFTs WNL.    - 8-22: GGT 79; likely related to cholestasis. With ongoing nausea will get abdominal US  for workup.    - 8.23: No gallstones or gallbladder dilation; notable R ascites w/ nodular liver and pleural effusion.  Did discuss results with patient and family.   - Will need GI follow up for findings as OP  16.  Central nausea versus central vertigo.   - Oversedated with Compazine , will schedule Zofran  4 mg 3 times daily head of meals/therapies to see if this improves symptoms   - Keep IV Compazine  and secondary for breakthrough   - No nystagmus on exam, but given location of stroke likely contribution of vertigo; will add as needed meclizine  12.5 mg 3 times daily. May need scopolamine patch    - 8/22: No vomitting reported; tolerating therapies much better.  Continue current regimen--continued improvement   -Much improved/resolved  17. R pleural effusion - incidental on abdominal US . ISB Q2H while awake, Dced IVF, continuing to hold diuretices d/t low BP and elevated HR.   18. Anemia.  Iron deficiency based on panel, likely contributed to dilution from antibiotics.  - FOBT negative - Added on Iron studies today--sat 9, iron 22.  Getting IV PRBCs today, would benefit from Venofer once completed.  Start daily iron supplement.  - received 1 U PRBC 8/26; hgb 8.7 8/29--appears stable  9/2 stable at 8.7  18.  Thrombocytosis.  Likely reactive, monitor  - 9/1 sl improvement to 498k   LOS: 13 days A FACE TO FACE EVALUATION WAS PERFORMED  Sara [name] 06/30/2024, 9:35 AM

## 2024-07-03 ENCOUNTER — Ambulatory Visit: Admitting: Podiatry

## 2024-07-03 ENCOUNTER — Ambulatory Visit: Admitting: Physician Assistant

## 2024-07-13 ENCOUNTER — Encounter: Payer: Self-pay | Admitting: Physician Assistant

## 2024-07-13 ENCOUNTER — Telehealth: Payer: Self-pay | Admitting: Neurology

## 2024-07-13 NOTE — Telephone Encounter (Signed)
 Pt daughter called and LMOM. States her mother was in hospital and was told to follow up with her neurologist asap. Last time she saw patel was 2024.

## 2024-07-20 ENCOUNTER — Encounter: Payer: Self-pay | Admitting: Adult Health

## 2024-07-20 ENCOUNTER — Ambulatory Visit (INDEPENDENT_AMBULATORY_CARE_PROVIDER_SITE_OTHER): Admitting: Physician Assistant

## 2024-07-20 ENCOUNTER — Other Ambulatory Visit (INDEPENDENT_AMBULATORY_CARE_PROVIDER_SITE_OTHER)

## 2024-07-20 ENCOUNTER — Encounter: Payer: Self-pay | Admitting: Physician Assistant

## 2024-07-20 VITALS — BP 124/60 | HR 95 | Ht 61.0 in | Wt 112.0 lb

## 2024-07-20 DIAGNOSIS — I6302 Cerebral infarction due to thrombosis of basilar artery: Secondary | ICD-10-CM

## 2024-07-20 DIAGNOSIS — R932 Abnormal findings on diagnostic imaging of liver and biliary tract: Secondary | ICD-10-CM

## 2024-07-20 DIAGNOSIS — R7989 Other specified abnormal findings of blood chemistry: Secondary | ICD-10-CM | POA: Diagnosis not present

## 2024-07-20 DIAGNOSIS — D649 Anemia, unspecified: Secondary | ICD-10-CM | POA: Diagnosis not present

## 2024-07-20 DIAGNOSIS — N1831 Chronic kidney disease, stage 3a: Secondary | ICD-10-CM | POA: Diagnosis not present

## 2024-07-20 DIAGNOSIS — E1122 Type 2 diabetes mellitus with diabetic chronic kidney disease: Secondary | ICD-10-CM | POA: Diagnosis not present

## 2024-07-20 DIAGNOSIS — R945 Abnormal results of liver function studies: Secondary | ICD-10-CM | POA: Diagnosis not present

## 2024-07-20 DIAGNOSIS — K5900 Constipation, unspecified: Secondary | ICD-10-CM

## 2024-07-20 DIAGNOSIS — K5904 Chronic idiopathic constipation: Secondary | ICD-10-CM

## 2024-07-20 LAB — CBC WITH DIFFERENTIAL/PLATELET
Basophils Absolute: 0 K/uL (ref 0.0–0.1)
Basophils Relative: 0.6 % (ref 0.0–3.0)
Eosinophils Absolute: 0.6 K/uL (ref 0.0–0.7)
Eosinophils Relative: 7 % — ABNORMAL HIGH (ref 0.0–5.0)
HCT: 29.9 % — ABNORMAL LOW (ref 36.0–46.0)
Hemoglobin: 9.7 g/dL — ABNORMAL LOW (ref 12.0–15.0)
Lymphocytes Relative: 27.6 % (ref 12.0–46.0)
Lymphs Abs: 2.2 K/uL (ref 0.7–4.0)
MCHC: 32.3 g/dL (ref 30.0–36.0)
MCV: 85 fl (ref 78.0–100.0)
Monocytes Absolute: 0.4 K/uL (ref 0.1–1.0)
Monocytes Relative: 5.2 % (ref 3.0–12.0)
Neutro Abs: 4.8 K/uL (ref 1.4–7.7)
Neutrophils Relative %: 59.6 % (ref 43.0–77.0)
Platelets: 196 K/uL (ref 150.0–400.0)
RBC: 3.52 Mil/uL — ABNORMAL LOW (ref 3.87–5.11)
RDW: 19 % — ABNORMAL HIGH (ref 11.5–15.5)
WBC: 8 K/uL (ref 4.0–10.5)

## 2024-07-20 LAB — BASIC METABOLIC PANEL WITH GFR
BUN: 45 mg/dL — ABNORMAL HIGH (ref 6–23)
CO2: 23 meq/L (ref 19–32)
Calcium: 10 mg/dL (ref 8.4–10.5)
Chloride: 106 meq/L (ref 96–112)
Creatinine, Ser: 1.86 mg/dL — ABNORMAL HIGH (ref 0.40–1.20)
GFR: 24.77 mL/min — ABNORMAL LOW (ref >60.00)
Glucose, Bld: 100 mg/dL — ABNORMAL HIGH (ref 70–99)
Potassium: 5.1 meq/L (ref 3.5–5.1)
Sodium: 135 meq/L (ref 135–145)

## 2024-07-20 LAB — IBC + FERRITIN
Ferritin: 40.3 ng/mL (ref 10.0–291.0)
Iron: 87 ug/dL (ref 42–145)
Saturation Ratios: 23.2 % (ref 20.0–50.0)
TIBC: 375.2 ug/dL (ref 250.0–450.0)
Transferrin: 268 mg/dL (ref 212.0–360.0)

## 2024-07-20 LAB — HEPATIC FUNCTION PANEL
ALT: 27 U/L (ref 0–35)
AST: 23 U/L (ref 0–37)
Albumin: 4.2 g/dL (ref 3.5–5.2)
Alkaline Phosphatase: 81 U/L (ref 39–117)
Bilirubin, Direct: 0.1 mg/dL (ref 0.0–0.3)
Total Bilirubin: 0.5 mg/dL (ref 0.2–1.2)
Total Protein: 8.3 g/dL (ref 6.0–8.3)

## 2024-07-20 LAB — GAMMA GT: GGT: 54 U/L — ABNORMAL HIGH (ref 7–51)

## 2024-07-20 NOTE — Patient Instructions (Signed)
  Your provider has requested that you go to the basement level for lab work before leaving today. Press B on the elevator. The lab is located at the first door on the left as you exit the elevator.  Please follow up in 3 months. Give us  a call at [phone] to schedule an appointment. Please call us  at the beginning of October to see about a December appointment.    You have been scheduled for an abdominal ultrasound at [facility] (1st floor of hospital) on 07-27-24 at 930am. Please arrive 15 minutes prior to your appointment for registration. Make certain not to have anything to eat or drink midnight prior to your appointment. Should you need to reschedule your appointment, please contact radiology at [phone]. This test typically takes about 30 minutes to perform.  Miralax  is an osmotic laxative.  It only brings more water  into the stool.  This is safe to take daily.  Can take up to 17 gram of miralax  twice a day.  Mix with juice or coffee.  Start 1 capful at night for 3-4 days and reassess your response in 3-4 days.  You can increase and decrease the dose based on your response.  Remember, it can take up to 3-4 days to take effect OR for the effects to wear off.   I often pair this with benefiber in the morning to help assure the stool is not too loose.   FIBER SUPPLEMENT You can do metamucil or fibercon once or twice a day but if this causes gas/bloating please switch to Benefiber or Citracel.  Fiber is good for constipation/diarrhea/irritable bowel syndrome.  It can also help with weight loss and can help lower your bad cholesterol (LDL).  Please do 1 TBSP in the morning in water , coffee, or tea.  It can take up to a month before you can see a difference with your bowel movements.  It is cheapest from [employer], [employer], [employer].

## 2024-07-20 NOTE — Progress Notes (Addendum)
 07/20/2024 Sara [name] [id number] 1941-03-11  Referring provider: Monty Daphne CROME, NP Primary GI doctor: Dr. Charlanne  ASSESSMENT AND PLAN:  Elevated liver enzymes with questionable nodular liver 09/03/2022 CT ABD without contrast for G-tube placement showed no liver abnormalities no gallstones gallbladder wall thickening or biliary dilation normal pancreas normal spleen unremarkable stomach and bowel RUQ US  06/19/2024 question nodular hepatic contour in the setting of admission of CVA and ascites    Latest Ref Rng & Units 06/18/2024    5:34 AM 06/17/2024    4:26 AM 06/16/2024    5:45 AM  Hepatic Function  Total Protein 6.5 - 8.1 g/dL 5.7     Albumin 3.5 - 5.0 g/dL 1.8  1.7  1.6   AST 15 - 41 U/L 33     ALT 0 - 44 U/L 29     Alk Phosphatase 38 - 126 U/L 216     Total Bilirubin 0.0 - 1.2 mg/dL 1.1      Platelets 501  INR 06/14/2024 1.3  Normal CT 2023 of liver and spleen, US  possible nodular liver, uncertain this is true cirrhosis versus fatty liver, will get elastography and labs to evaluate further - Labs to include: hepatitis C antibody, hepatitis B surface antigen, iron, ferritin, total iron binding capacity, IgG, ANA, Antismooth muscle antibody, celiac -will get Elastography  - continue monitor CBC, LFTs every 6 months, AB US  every 6 months - No ETOH use, no history of blood transfusions  IDA 06/29/2024  HGB 8.7 MCV 83.7 Platelets 498 06/23/2024 Iron 22 Ferritin 133 B12 1,660 Recent Labs    06/15/24 0604 06/16/24 0545 06/17/24 0426 06/18/24 0534 06/22/24 0513 06/23/24 0454 06/24/24 0439 06/25/24 0525 06/26/24 0447 06/29/24 0531  HGB 9.7* 9.7* 8.9* 8.9* 7.2* 6.9* 8.8* 8.3* 8.7* 8.7*  During hospitalization with sepsis and CKD patient required 1 PRBC Likely from hematuria, CKD, sepsis No overt GI bleeding, negative FOBT 08/25 Possible colonoscopy with Dr. Sybil at age 20, will try to get Recent labs last Monday with [address] No plan for endoscopic evaluation  at this time with recent CVA and sepsis Will get colonoscopy report and reevaluate in 3 months  ADDENDUM 09/14/2009 colonoscopy with Dr. Kristie for screening unremarkable colonoscopy to the cecum good bowel prep but multiple washes were done and small lesions could be missed.  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 Kristie for reflux and family history of stomach cancer small hiatal hernia otherwise unremarkable Consider EGD  Patient Care Team: Campbell Reynolds, NP as PCP - General Patel, Donika K, DO as Consulting Physician (Neurology)  HISTORY OF PRESENT ILLNESS: 83 y.o. female with a past medical history listed below presents for evaluation of elevated LFTs.   Patient had recent admission 06/17/2024 through 06/30/2024 with balance issues, dysphagia found to have acute on chronic kidney with severe sepsis due to Klebsiella UTI.  MRI brain showed acute left pontine  infarct and moderate chronic vessel disease.  On low-dose aspirin .  Echocardiogram normal ejection fraction grade 1 diastolic dysfunction.  During admission patient was found to have elevated ALT, alk phos normal GGT abdominal ultrasound no gallstones but did reveal mild ascites and nodular hepatic contour.  She also had an iron deficiency anemia status post 1 PRBC, negative FOBT, had  hematuria.  Discussed the use of AI scribe software for clinical note transcription with the patient, who gave verbal consent to proceed.  History of Present Illness   Sara [name] is an 83 year old female who presents for follow-up after recent hospitalization for sepsis secondary to a UTI, acute kidney injury, and probable stroke.  She was hospitalized from August 20th to September 2nd due to balance issues and trouble swallowing. During this admission, she was diagnosed with sepsis from a urinary tract infection (UTI), acute kidney injury, and a probable small stroke. She was treated with antibiotics for the UTI, which she reports 'messed up the system.'  During her hospital stay, she was found to have iron deficiency anemia with her hemoglobin dropping to 6.9 g/dL, for which she received a blood transfusion that increased her hemoglobin to 8.8 g/dL. No history of blood in her stool, and the blood was noted in her urine due to the UTI. Her blood count has not been checked since discharge, but her primary care doctor drew blood last Monday.  She experienced elevated liver function tests during her hospitalization. An ultrasound on August 22nd showed a questionable nodular hepatic contour. A CT scan from November 2023 had previously shown a normal liver. She denies any history of alcohol  use, drug use, or family history of liver disease.  She reports a history of trouble swallowing in November 2023, which has since resolved. Occasionally experiences heartburn, which she manages with over-the-counter antacids. She had nausea during her hospital stay, which has since improved.  She experienced constipation during her hospital stay, confirmed by an x-ray on August 27th. She is currently managing her bowel movements with Senokot and Miralax  as needed. Denies any family history of colon cancer.  She is recovering from her hospitalization and is working on regaining her balance, using a walker as  needed. She completed inpatient physical therapy but has not yet started outpatient therapy.        She  reports that she has never smoked. She has never used smokeless tobacco. She reports that she does not drink alcohol  and does not use drugs.  RELEVANT GI HISTORY, IMAGING AND LABS: Results   LABS Hb: 6.9  RADIOLOGY Right upper quadrant ultrasound: Questionable nodular hepatic contour (06/19/2024) Abdominal CT: Normal liver (November 2023) Abdominal x-ray: Constipation (06/24/2024)      CBC    Component Value Date/Time   WBC 7.8 06/29/2024 0531   RBC 3.12 (L) 06/29/2024 0531   HGB 8.7 (L) 06/29/2024 0531   HGB 14.4 01/13/2018 1433   HCT 26.1 (L) 06/29/2024 0531   HCT 45.5 01/13/2018 1433   PLT 498 (H) 06/29/2024 0531   PLT 243 01/13/2018 1433   MCV 83.7 06/29/2024 0531   MCV 84 01/13/2018 1433   MCH 27.9 06/29/2024 0531   MCHC 33.3 06/29/2024 0531   RDW 19.4 (H) 06/29/2024 0531   RDW 14.6 01/13/2018 1433   LYMPHSABS 3.3 06/26/2024 0447   MONOABS 0.8 06/26/2024 0447   EOSABS 0.3 06/26/2024 0447   BASOSABS 0.1 06/26/2024 0447   Recent Labs    06/15/24 0604 06/16/24 0545 06/17/24 9573  06/18/24 0534 06/22/24 0513 06/23/24 0454 06/24/24 0439 06/25/24 0525 06/26/24 0447 06/29/24 0531  HGB 9.7* 9.7* 8.9* 8.9* 7.2* 6.9* 8.8* 8.3* 8.7* 8.7*    CMP     Component Value Date/Time   NA 137 06/30/2024 0503   NA 142 11/28/2022 1130   K 5.6 (H) 06/30/2024 0503   CL 106 06/30/2024 0503   CO2 22 06/30/2024 0503   GLUCOSE 103 (H) 06/30/2024 0503   BUN 49 (H) 06/30/2024 0503   BUN 12 11/28/2022 1130   CREATININE 2.85 (H) 06/30/2024 0503   CALCIUM  9.3 06/30/2024 0503   PROT 5.7 (L) 06/18/2024 0534   PROT 7.2 01/13/2018 1433   ALBUMIN 1.8 (L) 06/18/2024 0534   ALBUMIN 4.2 01/13/2018 1433   AST 33 06/18/2024 0534   ALT 29 06/18/2024 0534   ALKPHOS 216 (H) 06/18/2024 0534   BILITOT 1.1 06/18/2024 0534   BILITOT 0.5 01/13/2018 1433   GFRNONAA 16 (L) 06/30/2024  0503   GFRAA 81 06/21/2020 1429      Latest Ref Rng & Units 06/18/2024    5:34 AM 06/17/2024    4:26 AM 06/16/2024    5:45 AM  Hepatic Function  Total Protein 6.5 - 8.1 g/dL 5.7     Albumin 3.5 - 5.0 g/dL 1.8  1.7  1.6   AST 15 - 41 U/L 33     ALT 0 - 44 U/L 29     Alk Phosphatase 38 - 126 U/L 216     Total Bilirubin 0.0 - 1.2 mg/dL 1.1         Current Medications:    Current Outpatient Medications (Cardiovascular):    atorvastatin  (LIPITOR) 40 MG tablet, Take 1 tablet (40 mg total) by mouth at bedtime.  Current Outpatient Medications (Respiratory):    cetirizine  (ZYRTEC ) 10 MG tablet, Take 1 tablet (10 mg total) by mouth daily as needed for allergies.  Current Outpatient Medications (Analgesics):    aspirin  EC 81 MG tablet, Take 1 tablet (81 mg total) by mouth daily. Swallow whole.  Current Outpatient Medications (Hematological):    ferrous sulfate  325 (65 FE) MG tablet, Take 1 tablet (325 mg total) by mouth daily with breakfast.  Current Outpatient Medications (Other):    Accu-Chek Softclix Lancets lancets, Please use to check blood sugar up to 4 times daily.   cholecalciferol (VITAMIN D3) 25 MCG (1000 UNIT) tablet, Take 1,000 Units by mouth daily.   glucose blood (ACCU-CHEK GUIDE) test strip, USE 1  4 TIMES DAILY   hydroxypropyl methylcellulose / hypromellose (ISOPTO TEARS / GONIOVISC) 2.5 % ophthalmic solution, Place 1 drop into both eyes 4 (four) times daily as needed for dry eyes.   lactose free nutrition (BOOST PLUS) LIQD, Take 237 mLs by mouth 3 (three) times daily with meals.   megestrol  (MEGACE ) 400 MG/10ML suspension, Take 5 mLs (200 mg total) by mouth 2 (two) times daily.   Nystatin (GERHARDT'S BUTT CREAM) CREA, Apply 1 Application topically daily. (Patient taking differently: Apply 1 Application topically daily. As needed)   senna-docusate (SENOKOT-S) 8.6-50 MG tablet, Take 2 tablets by mouth 2 (two) times daily.   thiamine  (VITAMIN B-1) 100 MG tablet, Take 1  tablet (100 mg total) by mouth daily.  Medical History:  Past Medical History:  Diagnosis Date   Allergy    Dizziness 02/21/2018   Estrogen deficiency 01/13/2018   Need for prophylactic vaccination against Streptococcus pneumoniae (pneumococcus) 01/13/2018   Normocytic anemia 04/11/2024   Right knee pain 05/29/2019   Routine  general medical examination at a health care facility 01/13/2018   Seasonal allergies 06/21/2020   Vaginal itching 06/21/2020   Vision changes 01/13/2018   Allergies: No Known Allergies   Surgical History:  She  has a past surgical history that includes DG THUMB RIGHT HAND ([facility] HX) (Right); Tubal ligation; Eye surgery; IR GASTROSTOMY TUBE MOD SED (09/05/2022); and IR GASTROSTOMY TUBE REMOVAL/REPAIR (12/04/2022). Family History:  Her family history includes Alzheimer's disease in her brother and father; Cancer in her brother and sister; Diabetes in her mother; Heart disease in her mother; Other in her mother.  REVIEW OF SYSTEMS  : All other systems reviewed and negative except where noted in the History of Present Illness.  PHYSICAL EXAM: BP 124/60   Pulse 95   Ht 5' 1 (1.549 m)   Wt 112 lb (50.8 kg)   BMI 21.16 kg/m  Physical Exam   GENERAL APPEARANCE: Well nourished, in no apparent distress. HEENT: No cervical lymphadenopathy, unremarkable thyroid, sclerae anicteric, conjunctiva pink. RESPIRATORY: Respiratory effort normal, breath sounds equal bilaterally without rales, rhonchi, or wheezing. Lungs clear to auscultation bilaterally. CARDIO: Regular rate and rhythm with no murmurs, rubs, or gallops, peripheral pulses intact. ABDOMEN: Soft, non-distended, active bowel sounds in all four quadrants, non-tender, no rebound, no mass appreciated, no jaundice. RECTAL: Declines. MUSCULOSKELETAL: Full range of motion, normal gait, without edema. SKIN: Dry, intact without rashes or lesions. No jaundice. NEURO: Alert, oriented, no focal deficits. PSYCH: Cooperative, normal  mood and affect.      Alan [name] Coombs, PA-C 3:10 PM

## 2024-07-21 ENCOUNTER — Other Ambulatory Visit (HOSPITAL_COMMUNITY): Payer: Self-pay | Admitting: Adult Health

## 2024-07-21 DIAGNOSIS — N950 Postmenopausal bleeding: Secondary | ICD-10-CM

## 2024-07-21 DIAGNOSIS — R339 Retention of urine, unspecified: Secondary | ICD-10-CM

## 2024-07-22 ENCOUNTER — Ambulatory Visit: Payer: Self-pay | Admitting: Physician Assistant

## 2024-07-23 LAB — ANTI-SMOOTH MUSCLE ANTIBODY, IGG: Actin (Smooth Muscle) Antibody (IGG): <20 U (ref <20)

## 2024-07-23 LAB — IGA: Immunoglobulin A: 267 mg/dL (ref 70–320)

## 2024-07-23 LAB — ANTI-NUCLEAR AB-TITER (ANA TITER)
ANA TITER: 1:40 {titer} — ABNORMAL HIGH
ANA TITER: 1:80 {titer} — ABNORMAL HIGH
ANA Titer 1: 1:80 {titer} — ABNORMAL HIGH

## 2024-07-23 LAB — IGG: IgG (Immunoglobin G), Serum: 2287 mg/dL — ABNORMAL HIGH (ref 600–1540)

## 2024-07-23 LAB — HEPATITIS B SURFACE ANTIGEN: Hepatitis B Surface Ag: NONREACTIVE

## 2024-07-23 LAB — HEPATITIS B SURFACE ANTIBODY,QUALITATIVE: Hep B S Ab: REACTIVE — AB

## 2024-07-23 LAB — MITOCHONDRIAL ANTIBODIES: Mitochondrial M2 Ab, IgG: <=20 U (ref <=20.0)

## 2024-07-23 LAB — HEPATITIS C ANTIBODY: Hepatitis C Ab: NONREACTIVE

## 2024-07-23 LAB — ANA: Anti Nuclear Antibody (ANA): POSITIVE — AB

## 2024-07-23 LAB — HEPATITIS A ANTIBODY, TOTAL: Hepatitis A AB,Total: REACTIVE — AB

## 2024-07-23 LAB — TISSUE TRANSGLUTAMINASE, IGA: (tTG) Ab, IgA: <1 U/mL

## 2024-07-24 ENCOUNTER — Ambulatory Visit (INDEPENDENT_AMBULATORY_CARE_PROVIDER_SITE_OTHER): Admitting: Podiatry

## 2024-07-24 ENCOUNTER — Encounter: Payer: Self-pay | Admitting: Podiatry

## 2024-07-24 DIAGNOSIS — M79675 Pain in left toe(s): Secondary | ICD-10-CM

## 2024-07-24 DIAGNOSIS — N1831 Chronic kidney disease, stage 3a: Secondary | ICD-10-CM

## 2024-07-24 DIAGNOSIS — Z794 Long term (current) use of insulin: Secondary | ICD-10-CM

## 2024-07-24 DIAGNOSIS — E119 Type 2 diabetes mellitus without complications: Secondary | ICD-10-CM | POA: Diagnosis not present

## 2024-07-24 DIAGNOSIS — B351 Tinea unguium: Secondary | ICD-10-CM

## 2024-07-24 DIAGNOSIS — M79674 Pain in right toe(s): Secondary | ICD-10-CM | POA: Diagnosis not present

## 2024-07-24 NOTE — Progress Notes (Signed)
This patient returns to my office for at risk foot care.  This patient requires this care by a professional since this patient will be at risk due to having diabetes.  This patient is unable to cut nails herself since the patient cannot reach her nails.These nails are painful walking and wearing shoes.  This patient presents for at risk foot care today.  General Appearance  Alert, conversant and in no acute stress.  Vascular  Dorsalis pedis and posterior tibial  pulses are weakly  palpable  bilaterally.  Capillary return is within normal limits  bilaterally. Temperature is within normal limits  bilaterally.  Neurologic  Senn-Weinstein monofilament wire test within normal limits  bilaterally. Muscle power within normal limits bilaterally.  Nails Thick disfigured discolored nails with subungual debris  from hallux to fifth toes bilaterally. No evidence of bacterial infection or drainage bilaterally.  Orthopedic  No limitations of motion  feet .  No crepitus or effusions noted.  No bony pathology or digital deformities noted.  HAV   B/L.  Skin  normotropic skin with no porokeratosis noted bilaterally.  No signs of infections or ulcers noted.     Onychomycosis  Pain in right toes  Pain in left toes  Consent was obtained for treatment procedures.   Mechanical debridement of nails 1-5  bilaterally performed with a nail nipper.  Filed with dremel without incident.    Return office visit   3 months                   Told patient to return for periodic foot care and evaluation due to potential at risk complications.   Gardiner Barefoot DPM

## 2024-07-27 ENCOUNTER — Ambulatory Visit (HOSPITAL_COMMUNITY)
Admission: RE | Admit: 2024-07-27 | Discharge: 2024-07-27 | Disposition: A | Discharge status: Home / Self Care | Source: Ambulatory Visit | Attending: Physician Assistant | Admitting: Physician Assistant

## 2024-07-27 ENCOUNTER — Encounter (HOSPITAL_COMMUNITY): Payer: Self-pay

## 2024-07-27 DIAGNOSIS — R932 Abnormal findings on diagnostic imaging of liver and biliary tract: Secondary | ICD-10-CM

## 2024-07-27 DIAGNOSIS — R7989 Other specified abnormal findings of blood chemistry: Secondary | ICD-10-CM

## 2024-07-29 ENCOUNTER — Ambulatory Visit (HOSPITAL_COMMUNITY)
Admission: RE | Admit: 2024-07-29 | Discharge: 2024-07-29 | Disposition: A | Discharge status: Home / Self Care | Source: Ambulatory Visit | Attending: Adult Health | Admitting: Adult Health

## 2024-07-29 DIAGNOSIS — N1370 Vesicoureteral-reflux, unspecified: Secondary | ICD-10-CM | POA: Insufficient documentation

## 2024-07-29 DIAGNOSIS — I7090 Unspecified atherosclerosis: Secondary | ICD-10-CM | POA: Diagnosis not present

## 2024-07-29 DIAGNOSIS — M129 Arthropathy, unspecified: Secondary | ICD-10-CM | POA: Insufficient documentation

## 2024-07-29 DIAGNOSIS — N1330 Unspecified hydronephrosis: Secondary | ICD-10-CM | POA: Diagnosis present

## 2024-07-29 DIAGNOSIS — R339 Retention of urine, unspecified: Secondary | ICD-10-CM

## 2024-07-29 DIAGNOSIS — N950 Postmenopausal bleeding: Secondary | ICD-10-CM

## 2024-07-29 MED ORDER — IOHEXOL 300 MG/ML  SOLN
50.0000 mL | Freq: Once | INTRAMUSCULAR | Status: AC | PRN
  Administered 2024-07-29: 50 mL

## 2024-08-04 ENCOUNTER — Ambulatory Visit (HOSPITAL_COMMUNITY)
Admission: RE | Admit: 2024-08-04 | Discharge: 2024-08-04 | Disposition: A | Discharge status: Home / Self Care | Source: Ambulatory Visit | Attending: Physician Assistant | Admitting: Physician Assistant

## 2024-08-04 DIAGNOSIS — R7989 Other specified abnormal findings of blood chemistry: Secondary | ICD-10-CM | POA: Insufficient documentation

## 2024-08-04 DIAGNOSIS — R932 Abnormal findings on diagnostic imaging of liver and biliary tract: Secondary | ICD-10-CM | POA: Insufficient documentation

## 2024-08-25 ENCOUNTER — Ambulatory Visit: Admitting: Neurology

## 2024-08-25 VITALS — BP 158/90 | HR 69 | Ht 61.0 in | Wt 116.0 lb

## 2024-08-25 DIAGNOSIS — I639 Cerebral infarction, unspecified: Secondary | ICD-10-CM | POA: Diagnosis not present

## 2024-08-25 NOTE — Progress Notes (Signed)
 Follow-up Visit   Date: 08/25/2024    Sara [name] MRN: [id number] DOB: 08-Sep-1941    Sara [name] is a 83 y.o. right-handed female with hyperlipidemia, diabetes mellitus, CKD stage 3, and hypertension returning to the clinic for follow-up of hypoglossal nerve palsy.  The patient was accompanied to the clinic by self.   IMPRESSION/PLAN: Incidental left punctate pontine infarct, asymptomatic.  Continue aspirin  81mg  daily.  BP was elevated today x 2.  She was instructed to follow-up with PCP for medication management.  She denies any lightheadedness, shortness of breath, or chest discomfort.  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   Return to clinic as needed  --------------------------------------------- History of present illness: She was admitted to [facility] in November with one-month history of dysphagia and 20lb weight loss.  She reports having sudden onset of dysphagia and felt the right tongue was swollen.  MRI neck shows edema involving the right side of the done concerning for denervation process.  No mass was seen.  MRI brain did not show acute stroke to explain symptoms.  Based on her symptoms, right hypoglossal nerve injury was suspected.  Due to her poor PO intake, she underwent PEG placement for tube feeds. When she was hospitalized, she was only able to eat broth and jello.  Over the past few months, her swallowing has improved and she has less fullness of the tongue an able drink liquids, eat soft foods, soup, and puddings without choking spells.   UPDATE 04/15/2023:  She is here for follow-up visit. She is doing great and swallowing much better.  PEG was removed months ago and she is back to eating regular food.  She remains highly independent and active at home.   No new complaints.    UPDATE 08/25/2024:  Patient was hospitalized in August for imbalance.  She was found to have UTI.  MRI brain shows punctate left pontine infarct.  She  denies any associated weakness, numbness/tingling, or unsteadiness.  She was evaluated by neurohospitalist who felt findings were incidental.  She denies any new neurological complaints.   Medications:  Current Outpatient Medications on File Prior to Visit  Medication Sig Dispense Refill   Accu-Chek Softclix Lancets lancets Please use to check blood sugar up to 4 times daily. 400 each 1   aspirin  EC 81 MG tablet Take 1 tablet (81 mg total) by mouth daily. Swallow whole.     atorvastatin  (LIPITOR) 40 MG tablet Take 1 tablet (40 mg total) by mouth at bedtime. 30 tablet 0   cetirizine  (ZYRTEC ) 10 MG tablet Take 1 tablet (10 mg total) by mouth daily as needed for allergies. 30 tablet 11   cholecalciferol (VITAMIN D3) 25 MCG (1000 UNIT) tablet Take 1,000 Units by mouth daily.     ferrous sulfate  325 (65 FE) MG tablet Take 1 tablet (325 mg total) by mouth daily with breakfast. 100 tablet 0   glucose blood (ACCU-CHEK GUIDE) test strip USE 1  4 TIMES DAILY 400 each 0   hydroxypropyl methylcellulose / hypromellose (ISOPTO TEARS / GONIOVISC) 2.5 % ophthalmic solution Place 1 drop into both eyes 4 (four) times daily as needed for dry eyes.     lactose free nutrition (BOOST PLUS) LIQD Take 237 mLs by mouth 3 (three) times daily with meals.     megestrol  (MEGACE ) 400 MG/10ML suspension Take 5 mLs (200 mg total) by mouth 2 (two) times daily. 240 mL 0   senna-docusate (SENOKOT-S) 8.6-50 MG tablet Take 2  tablets by mouth 2 (two) times daily. 120 tablet 0   thiamine  (VITAMIN B-1) 100 MG tablet Take 1 tablet (100 mg total) by mouth daily.     Nystatin (GERHARDT'S BUTT CREAM) CREA Apply 1 Application topically daily. (Patient taking differently: Apply 1 Application topically daily. As needed) 30 each 0   No current facility-administered medications on file prior to visit.    Allergies: No Known Allergies  Vital Signs:  BP (!) 158/90   Pulse 69   Ht 5' 1 (1.549 m)   Wt 116 lb (52.6 kg)   SpO2 100%   BMI  21.92 kg/m    Neurological Exam: MENTAL STATUS including orientation to time, place, person, recent and remote memory, attention span and concentration, language, and fund of knowledge is normal.  Speech is not dysarthric.  CRANIAL NERVES:  No visual field defects.  Pupils equal round and reactive to light.  Normal conjugate, extra-ocular eye movements in all directions of gaze.  No ptosis.  Face is symmetric. Palate elevates symmetrically.  Tongue is very mildly deviated to the right, strength is intact.  MOTOR:  Motor strength is 5/5 in all extremities.  No atrophy, fasciculations or abnormal movements.  No pronator drift.  Tone is normal.    MSRs:  Reflexes are 2+/4 throughout .  COORDINATION/GAIT:  Gait narrow based and stable, unassisted.   Data:  Lab Results  Component Value Date   HGBA1C 6.6 (H) 06/15/2024   MRI brain wo contrast 06/14/2024: 1. Punctate acute left pontine infarct. 2. Moderate chronic small vessel ischemic disease.   Thank you for allowing me to participate in patient's care.  If I can answer any additional questions, I would be pleased to do so.    Sincerely,    Henretter Piekarski K. Tobie, DO

## 2024-10-26 ENCOUNTER — Ambulatory Visit: Admitting: Podiatry

## 2024-10-26 ENCOUNTER — Encounter: Payer: Self-pay | Admitting: Podiatry

## 2024-10-26 VITALS — Ht 61.0 in | Wt 116.0 lb

## 2024-10-26 DIAGNOSIS — E119 Type 2 diabetes mellitus without complications: Secondary | ICD-10-CM

## 2024-10-26 DIAGNOSIS — Z794 Long term (current) use of insulin: Secondary | ICD-10-CM

## 2024-10-26 DIAGNOSIS — N1831 Chronic kidney disease, stage 3a: Secondary | ICD-10-CM

## 2024-10-26 DIAGNOSIS — B351 Tinea unguium: Secondary | ICD-10-CM

## 2024-10-26 DIAGNOSIS — M79674 Pain in right toe(s): Secondary | ICD-10-CM

## 2024-10-26 DIAGNOSIS — M79675 Pain in left toe(s): Secondary | ICD-10-CM | POA: Diagnosis not present

## 2024-10-26 NOTE — Progress Notes (Signed)
This patient returns to my office for at risk foot care.  This patient requires this care by a professional since this patient will be at risk due to having diabetes.  This patient is unable to cut nails herself since the patient cannot reach her nails.These nails are painful walking and wearing shoes.  This patient presents for at risk foot care today.  General Appearance  Alert, conversant and in no acute stress.  Vascular  Dorsalis pedis and posterior tibial  pulses are weakly  palpable  bilaterally.  Capillary return is within normal limits  bilaterally. Temperature is within normal limits  bilaterally.  Neurologic  Senn-Weinstein monofilament wire test within normal limits  bilaterally. Muscle power within normal limits bilaterally.  Nails Thick disfigured discolored nails with subungual debris  from hallux to fifth toes bilaterally. No evidence of bacterial infection or drainage bilaterally.  Orthopedic  No limitations of motion  feet .  No crepitus or effusions noted.  No bony pathology or digital deformities noted.  HAV   B/L.  Skin  normotropic skin with no porokeratosis noted bilaterally.  No signs of infections or ulcers noted.     Onychomycosis  Pain in right toes  Pain in left toes  Consent was obtained for treatment procedures.   Mechanical debridement of nails 1-5  bilaterally performed with a nail nipper.  Filed with dremel without incident.    Return office visit   3 months                   Told patient to return for periodic foot care and evaluation due to potential at risk complications.   Gardiner Barefoot DPM

## 2024-11-03 ENCOUNTER — Other Ambulatory Visit: Payer: Self-pay | Admitting: Urology

## 2025-01-07 ENCOUNTER — Ambulatory Visit (HOSPITAL_COMMUNITY): Admit: 2025-01-07 | Admitting: Urology

## 2025-01-25 ENCOUNTER — Ambulatory Visit: Admitting: Podiatry
